# Patient Record
Sex: Female | Born: 1935 | Race: Black or African American | Hispanic: No | Smoking: Former smoker
Health system: Southern US, Community
[De-identification: ages and names within clinical notes are randomized; demographics above are authoritative.]

## PROBLEM LIST (undated history)

## (undated) DIAGNOSIS — E785 Hyperlipidemia, unspecified: Secondary | ICD-10-CM

## (undated) DIAGNOSIS — M1990 Unspecified osteoarthritis, unspecified site: Secondary | ICD-10-CM

## (undated) DIAGNOSIS — G9340 Encephalopathy, unspecified: Secondary | ICD-10-CM

## (undated) DIAGNOSIS — I10 Essential (primary) hypertension: Secondary | ICD-10-CM

## (undated) DIAGNOSIS — E669 Obesity, unspecified: Secondary | ICD-10-CM

## (undated) DIAGNOSIS — K922 Gastrointestinal hemorrhage, unspecified: Secondary | ICD-10-CM

## (undated) DIAGNOSIS — M533 Sacrococcygeal disorders, not elsewhere classified: Secondary | ICD-10-CM

## (undated) DIAGNOSIS — R413 Other amnesia: Secondary | ICD-10-CM

## (undated) HISTORY — PX: JOINT REPLACEMENT: SHX530

## (undated) HISTORY — DX: Sacrococcygeal disorders, not elsewhere classified: M53.3

## (undated) HISTORY — DX: Other amnesia: R41.3

## (undated) HISTORY — DX: Essential (primary) hypertension: I10

## (undated) HISTORY — PX: CHOLECYSTECTOMY: SHX55

## (undated) HISTORY — DX: Encephalopathy, unspecified: G93.40

## (undated) HISTORY — DX: Obesity, unspecified: E66.9

## (undated) HISTORY — PX: APPENDECTOMY: SHX54

## (undated) HISTORY — DX: Gastrointestinal hemorrhage, unspecified: K92.2

## (undated) HISTORY — DX: Unspecified osteoarthritis, unspecified site: M19.90

## (undated) HISTORY — DX: Hyperlipidemia, unspecified: E78.5

---

## 1962-04-12 HISTORY — PX: OTHER SURGICAL HISTORY: SHX169

## 1986-04-12 HISTORY — PX: COLON RESECTION: SHX5231

## 1996-04-12 HISTORY — PX: POLYPECTOMY: SHX149

## 1999-09-10 ENCOUNTER — Encounter (INDEPENDENT_AMBULATORY_CARE_PROVIDER_SITE_OTHER): Payer: Self-pay | Admitting: *Deleted

## 1999-10-21 ENCOUNTER — Other Ambulatory Visit: Admission: RE | Admit: 1999-10-21 | Discharge: 1999-10-21 | Payer: Self-pay | Admitting: Family Medicine

## 1999-10-29 ENCOUNTER — Encounter: Admission: RE | Admit: 1999-10-29 | Discharge: 1999-10-29 | Payer: Self-pay | Admitting: Family Medicine

## 1999-10-29 ENCOUNTER — Encounter: Payer: Self-pay | Admitting: Family Medicine

## 2000-05-13 HISTORY — PX: OTHER SURGICAL HISTORY: SHX169

## 2000-06-03 ENCOUNTER — Encounter (INDEPENDENT_AMBULATORY_CARE_PROVIDER_SITE_OTHER): Payer: Self-pay | Admitting: Specialist

## 2000-06-03 ENCOUNTER — Ambulatory Visit (HOSPITAL_COMMUNITY): Admission: RE | Admit: 2000-06-03 | Discharge: 2000-06-03 | Payer: Self-pay | Admitting: Obstetrics and Gynecology

## 2000-10-10 ENCOUNTER — Encounter (INDEPENDENT_AMBULATORY_CARE_PROVIDER_SITE_OTHER): Payer: Self-pay | Admitting: *Deleted

## 2000-11-03 ENCOUNTER — Encounter: Admission: RE | Admit: 2000-11-03 | Discharge: 2000-11-03 | Payer: Self-pay | Admitting: Family Medicine

## 2000-11-03 ENCOUNTER — Encounter: Payer: Self-pay | Admitting: Family Medicine

## 2002-01-08 ENCOUNTER — Encounter: Payer: Self-pay | Admitting: Family Medicine

## 2002-01-08 ENCOUNTER — Encounter: Admission: RE | Admit: 2002-01-08 | Discharge: 2002-01-08 | Payer: Self-pay | Admitting: Family Medicine

## 2002-11-07 ENCOUNTER — Encounter: Payer: Self-pay | Admitting: Family Medicine

## 2002-11-07 ENCOUNTER — Encounter: Admission: RE | Admit: 2002-11-07 | Discharge: 2002-11-07 | Payer: Self-pay | Admitting: Family Medicine

## 2003-04-22 ENCOUNTER — Emergency Department (HOSPITAL_COMMUNITY): Admission: EM | Admit: 2003-04-22 | Discharge: 2003-04-23 | Payer: Self-pay | Admitting: Emergency Medicine

## 2004-03-04 ENCOUNTER — Encounter: Admission: RE | Admit: 2004-03-04 | Discharge: 2004-03-04 | Payer: Self-pay | Admitting: Family Medicine

## 2004-06-10 ENCOUNTER — Ambulatory Visit: Payer: Self-pay | Admitting: Family Medicine

## 2004-08-03 ENCOUNTER — Ambulatory Visit: Payer: Self-pay | Admitting: Family Medicine

## 2005-02-25 ENCOUNTER — Ambulatory Visit: Payer: Self-pay | Admitting: Family Medicine

## 2005-03-03 ENCOUNTER — Ambulatory Visit: Payer: Self-pay | Admitting: Family Medicine

## 2005-03-18 ENCOUNTER — Encounter: Admission: RE | Admit: 2005-03-18 | Discharge: 2005-03-18 | Payer: Self-pay | Admitting: Family Medicine

## 2005-09-03 ENCOUNTER — Ambulatory Visit: Payer: Self-pay | Admitting: Family Medicine

## 2006-02-24 ENCOUNTER — Ambulatory Visit: Payer: Self-pay | Admitting: Family Medicine

## 2006-03-08 ENCOUNTER — Ambulatory Visit: Payer: Self-pay | Admitting: *Deleted

## 2006-03-11 ENCOUNTER — Ambulatory Visit: Payer: Self-pay | Admitting: Family Medicine

## 2006-03-15 ENCOUNTER — Ambulatory Visit: Payer: Self-pay | Admitting: Family Medicine

## 2006-03-24 ENCOUNTER — Encounter (INDEPENDENT_AMBULATORY_CARE_PROVIDER_SITE_OTHER): Payer: Self-pay | Admitting: *Deleted

## 2006-03-24 ENCOUNTER — Ambulatory Visit: Payer: Self-pay | Admitting: Family Medicine

## 2006-04-15 ENCOUNTER — Ambulatory Visit: Payer: Self-pay | Admitting: Family Medicine

## 2006-04-15 LAB — FECAL OCCULT BLOOD, GUAIAC: Fecal Occult Blood: NEGATIVE

## 2006-05-17 ENCOUNTER — Encounter: Admission: RE | Admit: 2006-05-17 | Discharge: 2006-05-17 | Payer: Self-pay | Admitting: Family Medicine

## 2006-07-05 ENCOUNTER — Ambulatory Visit: Payer: Self-pay | Admitting: Family Medicine

## 2006-07-05 ENCOUNTER — Encounter (INDEPENDENT_AMBULATORY_CARE_PROVIDER_SITE_OTHER): Payer: Self-pay | Admitting: *Deleted

## 2006-07-05 LAB — CONVERTED CEMR LAB
BUN: 18 mg/dL (ref 6–23)
Calcium: 10.1 mg/dL (ref 8.4–10.5)
Creatinine, Ser: 1.2 mg/dL (ref 0.4–1.2)
Creatinine,U: 184.3 mg/dL
Glucose, Bld: 113 mg/dL — ABNORMAL HIGH (ref 70–99)
Hemoglobin: 11.5 g/dL — ABNORMAL LOW (ref 12.0–15.0)
Hgb A1c MFr Bld: 6.6 %
Hgb A1c MFr Bld: 6.6 % — ABNORMAL HIGH (ref 4.6–6.0)
Microalb Creat Ratio: 6 mg/g (ref 0.0–30.0)
Microalb, Ur: 1.1 mg/dL (ref 0.0–1.9)
Phosphorus: 3 mg/dL (ref 2.3–4.6)
Potassium: 4 meq/L (ref 3.5–5.1)

## 2006-07-26 ENCOUNTER — Encounter (INDEPENDENT_AMBULATORY_CARE_PROVIDER_SITE_OTHER): Payer: Self-pay | Admitting: *Deleted

## 2006-07-26 DIAGNOSIS — M1990 Unspecified osteoarthritis, unspecified site: Secondary | ICD-10-CM | POA: Insufficient documentation

## 2006-07-26 DIAGNOSIS — I8000 Phlebitis and thrombophlebitis of superficial vessels of unspecified lower extremity: Secondary | ICD-10-CM | POA: Insufficient documentation

## 2006-07-26 DIAGNOSIS — G479 Sleep disorder, unspecified: Secondary | ICD-10-CM

## 2006-07-26 DIAGNOSIS — M8580 Other specified disorders of bone density and structure, unspecified site: Secondary | ICD-10-CM

## 2006-07-26 DIAGNOSIS — K649 Unspecified hemorrhoids: Secondary | ICD-10-CM | POA: Insufficient documentation

## 2006-07-26 DIAGNOSIS — E785 Hyperlipidemia, unspecified: Secondary | ICD-10-CM | POA: Insufficient documentation

## 2007-03-06 ENCOUNTER — Telehealth (INDEPENDENT_AMBULATORY_CARE_PROVIDER_SITE_OTHER): Payer: Self-pay | Admitting: *Deleted

## 2007-03-06 ENCOUNTER — Telehealth: Payer: Self-pay | Admitting: Family Medicine

## 2007-03-28 ENCOUNTER — Encounter: Payer: Self-pay | Admitting: Family Medicine

## 2007-03-28 ENCOUNTER — Other Ambulatory Visit: Admission: RE | Admit: 2007-03-28 | Discharge: 2007-03-28 | Payer: Self-pay | Admitting: Family Medicine

## 2007-03-28 ENCOUNTER — Ambulatory Visit: Payer: Self-pay | Admitting: Family Medicine

## 2007-03-29 LAB — CONVERTED CEMR LAB
ALT: 18 units/L (ref 0–35)
AST: 33 units/L (ref 0–37)
BUN: 17 mg/dL (ref 6–23)
Basophils Absolute: 0 10*3/uL (ref 0.0–0.1)
CO2: 26 meq/L (ref 19–32)
Calcium: 10.1 mg/dL (ref 8.4–10.5)
Cholesterol: 157 mg/dL (ref 0–200)
Creatinine, Ser: 1.1 mg/dL (ref 0.4–1.2)
Eosinophils Relative: 0.1 % (ref 0.0–5.0)
GFR calc Af Amer: 62 mL/min
Glucose, Bld: 122 mg/dL — ABNORMAL HIGH (ref 70–99)
HDL: 40.3 mg/dL (ref >39.0)
LDL Cholesterol: 86 mg/dL (ref 0–99)
MCHC: 33.5 g/dL (ref 30.0–36.0)
MCV: 85 fL (ref 78.0–100.0)
Neutrophils Relative %: 58.3 % (ref 43.0–77.0)
Platelets: 221 10*3/uL (ref 150–400)
Potassium: 4.5 meq/L (ref 3.5–5.1)
RBC: 4.03 M/uL (ref 3.87–5.11)
RDW: 13.3 % (ref 11.5–14.6)
Total CHOL/HDL Ratio: 3.9
Total Protein: 7.4 g/dL (ref 6.0–8.3)
VLDL: 31 mg/dL (ref 0–40)
WBC: 6.6 10*3/uL (ref 4.5–10.5)

## 2007-04-04 ENCOUNTER — Encounter (INDEPENDENT_AMBULATORY_CARE_PROVIDER_SITE_OTHER): Payer: Self-pay | Admitting: *Deleted

## 2007-04-24 ENCOUNTER — Encounter (INDEPENDENT_AMBULATORY_CARE_PROVIDER_SITE_OTHER): Payer: Self-pay | Admitting: *Deleted

## 2007-05-18 ENCOUNTER — Ambulatory Visit: Payer: Self-pay | Admitting: Family Medicine

## 2007-05-18 ENCOUNTER — Telehealth: Payer: Self-pay | Admitting: Family Medicine

## 2007-05-18 LAB — CONVERTED CEMR LAB
Casts: 0 /lpf
Glucose, Urine, Semiquant: NEGATIVE
Ketones, urine, test strip: NEGATIVE
Nitrite: NEGATIVE
Protein, U semiquant: 30
Protein, U semiquant: 30
Specific Gravity, Urine: 1.025
Yeast, UA: 0
pH: 6
pH: 6

## 2007-05-19 ENCOUNTER — Encounter: Admission: RE | Admit: 2007-05-19 | Discharge: 2007-05-19 | Payer: Self-pay | Admitting: Family Medicine

## 2007-05-19 ENCOUNTER — Encounter: Payer: Self-pay | Admitting: Family Medicine

## 2007-05-24 ENCOUNTER — Encounter (INDEPENDENT_AMBULATORY_CARE_PROVIDER_SITE_OTHER): Payer: Self-pay | Admitting: *Deleted

## 2007-07-13 ENCOUNTER — Ambulatory Visit: Payer: Self-pay | Admitting: Family Medicine

## 2007-08-03 ENCOUNTER — Telehealth: Payer: Self-pay | Admitting: Family Medicine

## 2007-09-27 ENCOUNTER — Ambulatory Visit: Payer: Self-pay | Admitting: Family Medicine

## 2007-09-28 LAB — CONVERTED CEMR LAB
HDL: 43.7 mg/dL (ref >39.0)
Hgb A1c MFr Bld: 7 % — ABNORMAL HIGH (ref 4.6–6.0)
LDL Cholesterol: 95 mg/dL (ref 0–99)
VLDL: 32 mg/dL (ref 0–40)

## 2007-10-31 ENCOUNTER — Ambulatory Visit: Payer: Self-pay | Admitting: Family Medicine

## 2007-10-31 DIAGNOSIS — E669 Obesity, unspecified: Secondary | ICD-10-CM

## 2007-10-31 DIAGNOSIS — I10 Essential (primary) hypertension: Secondary | ICD-10-CM

## 2007-12-04 ENCOUNTER — Ambulatory Visit: Payer: Self-pay | Admitting: Family Medicine

## 2008-02-02 ENCOUNTER — Ambulatory Visit: Payer: Self-pay | Admitting: Family Medicine

## 2008-02-05 LAB — CONVERTED CEMR LAB
AST: 23 units/L (ref 0–37)
BUN: 19 mg/dL (ref 6–23)
Calcium: 9.9 mg/dL (ref 8.4–10.5)
Cholesterol: 174 mg/dL (ref 0–200)
Creatinine, Ser: 1.2 mg/dL (ref 0.4–1.2)
GFR calc non Af Amer: 46 mL/min
Hgb A1c MFr Bld: 6.9 % — ABNORMAL HIGH (ref 4.6–6.0)
Phosphorus: 3.7 mg/dL (ref 2.3–4.6)
Sodium: 140 meq/L (ref 135–145)
Total CHOL/HDL Ratio: 3.7

## 2008-03-05 ENCOUNTER — Ambulatory Visit: Payer: Self-pay | Admitting: Family Medicine

## 2008-03-26 ENCOUNTER — Telehealth (INDEPENDENT_AMBULATORY_CARE_PROVIDER_SITE_OTHER): Payer: Self-pay | Admitting: *Deleted

## 2008-05-22 ENCOUNTER — Encounter: Admission: RE | Admit: 2008-05-22 | Discharge: 2008-05-22 | Payer: Self-pay | Admitting: Family Medicine

## 2008-05-23 ENCOUNTER — Encounter (INDEPENDENT_AMBULATORY_CARE_PROVIDER_SITE_OTHER): Payer: Self-pay | Admitting: *Deleted

## 2008-07-08 ENCOUNTER — Ambulatory Visit: Payer: Self-pay | Admitting: Family Medicine

## 2008-07-08 LAB — CONVERTED CEMR LAB
Glucose, Urine, Semiquant: NEGATIVE
Protein, U semiquant: 100
Specific Gravity, Urine: 1.02
Urobilinogen, UA: 0.2

## 2008-09-17 ENCOUNTER — Ambulatory Visit: Payer: Self-pay | Admitting: Family Medicine

## 2008-09-17 LAB — CONVERTED CEMR LAB
Albumin: 3.9 g/dL (ref 3.5–5.2)
BUN: 17 mg/dL (ref 6–23)
Basophils Relative: 0.3 % (ref 0.0–3.0)
Calcium: 10 mg/dL (ref 8.4–10.5)
Cholesterol: 160 mg/dL (ref 0–200)
Creatinine, Ser: 1 mg/dL (ref 0.4–1.2)
Creatinine,U: 96.6 mg/dL
Hemoglobin: 11.6 g/dL — ABNORMAL LOW (ref 12.0–15.0)
Hgb A1c MFr Bld: 6.8 % — ABNORMAL HIGH (ref 4.6–6.5)
Lymphocytes Relative: 36.2 % (ref 12.0–46.0)
Monocytes Relative: 9.3 % (ref 3.0–12.0)
Neutrophils Relative %: 53.9 % (ref 43.0–77.0)
Phosphorus: 3.3 mg/dL (ref 2.3–4.6)
Platelets: 222 10*3/uL (ref 150.0–400.0)
RBC: 4.08 M/uL (ref 3.87–5.11)
RDW: 12.9 % (ref 11.5–14.6)
TSH: 2.37 microintl units/mL (ref 0.35–5.50)
Total Protein: 7.9 g/dL (ref 6.0–8.3)
Triglycerides: 134 mg/dL (ref 0.0–149.0)
WBC: 6.1 10*3/uL (ref 4.5–10.5)

## 2008-09-25 ENCOUNTER — Ambulatory Visit: Payer: Self-pay | Admitting: Family Medicine

## 2008-10-22 ENCOUNTER — Encounter (INDEPENDENT_AMBULATORY_CARE_PROVIDER_SITE_OTHER): Payer: Self-pay | Admitting: *Deleted

## 2008-11-28 ENCOUNTER — Encounter: Payer: Self-pay | Admitting: Family Medicine

## 2008-11-29 ENCOUNTER — Encounter: Payer: Self-pay | Admitting: Family Medicine

## 2008-11-30 ENCOUNTER — Encounter: Payer: Self-pay | Admitting: Family Medicine

## 2008-12-02 ENCOUNTER — Encounter: Payer: Self-pay | Admitting: Family Medicine

## 2008-12-13 ENCOUNTER — Encounter: Payer: Self-pay | Admitting: Family Medicine

## 2008-12-26 ENCOUNTER — Ambulatory Visit: Payer: Self-pay | Admitting: Gastroenterology

## 2008-12-30 ENCOUNTER — Ambulatory Visit: Payer: Self-pay | Admitting: Family Medicine

## 2008-12-30 LAB — CONVERTED CEMR LAB
Casts: 0 /lpf
Glucose, Urine, Semiquant: NEGATIVE
Protein, U semiquant: NEGATIVE
RBC / HPF: 1
Urine crystals, microscopic: 0 /hpf
Yeast, UA: 0

## 2008-12-31 ENCOUNTER — Encounter: Payer: Self-pay | Admitting: Family Medicine

## 2009-01-08 ENCOUNTER — Ambulatory Visit: Payer: Self-pay | Admitting: Gastroenterology

## 2009-03-18 ENCOUNTER — Telehealth: Payer: Self-pay | Admitting: Family Medicine

## 2009-03-18 ENCOUNTER — Ambulatory Visit: Payer: Self-pay | Admitting: Family Medicine

## 2009-03-18 LAB — CONVERTED CEMR LAB
Bilirubin Urine: NEGATIVE
Glucose, Urine, Semiquant: NEGATIVE
Nitrite: NEGATIVE
Specific Gravity, Urine: 1.01
Urobilinogen, UA: 0.2
Yeast, UA: 0

## 2009-04-10 ENCOUNTER — Ambulatory Visit: Payer: Self-pay | Admitting: Family Medicine

## 2009-04-13 LAB — CONVERTED CEMR LAB
ALT: 13 units/L (ref 0–35)
Albumin: 3.9 g/dL (ref 3.5–5.2)
BUN: 20 mg/dL (ref 6–23)
Chloride: 104 meq/L (ref 96–112)
GFR calc non Af Amer: 50.85 mL/min (ref >60)
HDL: 50.8 mg/dL (ref >39.00)
TSH: 2.41 microintl units/mL (ref 0.35–5.50)
Triglycerides: 160 mg/dL — ABNORMAL HIGH (ref 0.0–149.0)

## 2009-04-16 ENCOUNTER — Ambulatory Visit: Payer: Self-pay | Admitting: Family Medicine

## 2009-04-24 ENCOUNTER — Telehealth: Payer: Self-pay | Admitting: Gastroenterology

## 2009-04-25 ENCOUNTER — Ambulatory Visit: Payer: Self-pay | Admitting: Family Medicine

## 2009-04-28 ENCOUNTER — Telehealth (INDEPENDENT_AMBULATORY_CARE_PROVIDER_SITE_OTHER): Payer: Self-pay | Admitting: *Deleted

## 2009-04-29 ENCOUNTER — Telehealth: Payer: Self-pay | Admitting: Family Medicine

## 2009-04-30 ENCOUNTER — Ambulatory Visit: Payer: Self-pay | Admitting: Cardiology

## 2009-05-06 ENCOUNTER — Encounter: Payer: Self-pay | Admitting: Cardiology

## 2009-05-06 ENCOUNTER — Ambulatory Visit: Payer: Self-pay

## 2009-05-09 ENCOUNTER — Encounter: Payer: Self-pay | Admitting: Cardiology

## 2009-05-20 ENCOUNTER — Ambulatory Visit: Payer: Self-pay | Admitting: Family Medicine

## 2009-06-09 ENCOUNTER — Inpatient Hospital Stay (HOSPITAL_COMMUNITY): Admission: RE | Admit: 2009-06-09 | Discharge: 2009-06-13 | Payer: Self-pay | Admitting: Orthopedic Surgery

## 2009-06-11 ENCOUNTER — Encounter (INDEPENDENT_AMBULATORY_CARE_PROVIDER_SITE_OTHER): Payer: Self-pay | Admitting: Orthopedic Surgery

## 2009-06-11 ENCOUNTER — Ambulatory Visit: Payer: Self-pay | Admitting: Vascular Surgery

## 2009-06-13 ENCOUNTER — Encounter: Payer: Self-pay | Admitting: Family Medicine

## 2009-07-01 ENCOUNTER — Telehealth: Payer: Self-pay | Admitting: Family Medicine

## 2009-07-08 ENCOUNTER — Inpatient Hospital Stay (HOSPITAL_COMMUNITY): Admission: EM | Admit: 2009-07-08 | Discharge: 2009-07-17 | Payer: Self-pay | Admitting: Emergency Medicine

## 2009-07-09 ENCOUNTER — Telehealth: Payer: Self-pay | Admitting: Family Medicine

## 2009-07-09 ENCOUNTER — Ambulatory Visit: Payer: Self-pay | Admitting: Gastroenterology

## 2009-07-11 ENCOUNTER — Encounter: Payer: Self-pay | Admitting: Gastroenterology

## 2009-07-11 HISTORY — PX: TOTAL KNEE ARTHROPLASTY: SHX125

## 2009-07-14 ENCOUNTER — Encounter: Payer: Self-pay | Admitting: Family Medicine

## 2009-07-14 ENCOUNTER — Telehealth: Payer: Self-pay | Admitting: Family Medicine

## 2009-08-15 ENCOUNTER — Telehealth: Payer: Self-pay | Admitting: Family Medicine

## 2009-08-18 ENCOUNTER — Telehealth: Payer: Self-pay | Admitting: Family Medicine

## 2009-08-18 ENCOUNTER — Ambulatory Visit: Payer: Self-pay | Admitting: Family Medicine

## 2009-08-18 DIAGNOSIS — D649 Anemia, unspecified: Secondary | ICD-10-CM

## 2009-08-18 DIAGNOSIS — L89609 Pressure ulcer of unspecified heel, unspecified stage: Secondary | ICD-10-CM | POA: Insufficient documentation

## 2009-08-19 LAB — CONVERTED CEMR LAB
AST: 23 units/L (ref 0–37)
Albumin: 3.9 g/dL (ref 3.5–5.2)
Basophils Absolute: 0 10*3/uL (ref 0.0–0.1)
Calcium: 10.1 mg/dL (ref 8.4–10.5)
Chloride: 102 meq/L (ref 96–112)
Creatinine, Ser: 0.9 mg/dL (ref 0.4–1.2)
Eosinophils Absolute: 0 10*3/uL (ref 0.0–0.7)
Eosinophils Relative: 0.1 % (ref 0.0–5.0)
GFR calc non Af Amer: 73.85 mL/min (ref >60)
MCHC: 33.3 g/dL (ref 30.0–36.0)
MCV: 86 fL (ref 78.0–100.0)
Monocytes Absolute: 0.7 10*3/uL (ref 0.1–1.0)
Phosphorus: 2.9 mg/dL (ref 2.3–4.6)
Potassium: 5.1 meq/L (ref 3.5–5.1)
RBC: 3.69 M/uL — ABNORMAL LOW (ref 3.87–5.11)
Sodium: 139 meq/L (ref 135–145)

## 2009-09-18 ENCOUNTER — Ambulatory Visit: Payer: Self-pay | Admitting: Family Medicine

## 2009-09-22 ENCOUNTER — Telehealth: Payer: Self-pay | Admitting: Family Medicine

## 2009-09-22 LAB — CONVERTED CEMR LAB
Albumin: 3.9 g/dL (ref 3.5–5.2)
BUN: 17 mg/dL (ref 6–23)
Basophils Absolute: 0 10*3/uL (ref 0.0–0.1)
Basophils Relative: 0.4 % (ref 0.0–3.0)
Chloride: 104 meq/L (ref 96–112)
Cholesterol: 260 mg/dL — ABNORMAL HIGH (ref 0–200)
Eosinophils Relative: 0.2 % (ref 0.0–5.0)
HCT: 34.2 % — ABNORMAL LOW (ref 36.0–46.0)
HDL: 52.4 mg/dL (ref >39.00)
Lymphocytes Relative: 30 % (ref 12.0–46.0)
Lymphs Abs: 2.2 10*3/uL (ref 0.7–4.0)
Monocytes Relative: 7.4 % (ref 3.0–12.0)
Neutro Abs: 4.5 10*3/uL (ref 1.4–7.7)
Platelets: 240 10*3/uL (ref 150.0–400.0)
Sodium: 140 meq/L (ref 135–145)
Total CHOL/HDL Ratio: 5
Triglycerides: 312 mg/dL — ABNORMAL HIGH (ref 0.0–149.0)

## 2009-10-15 ENCOUNTER — Ambulatory Visit: Payer: Self-pay | Admitting: Family Medicine

## 2009-10-21 ENCOUNTER — Encounter: Payer: Self-pay | Admitting: Family Medicine

## 2009-11-12 ENCOUNTER — Telehealth: Payer: Self-pay | Admitting: Family Medicine

## 2009-11-13 ENCOUNTER — Encounter: Payer: Self-pay | Admitting: Family Medicine

## 2009-12-16 ENCOUNTER — Telehealth: Payer: Self-pay | Admitting: Family Medicine

## 2010-01-15 ENCOUNTER — Telehealth: Payer: Self-pay | Admitting: Family Medicine

## 2010-01-15 ENCOUNTER — Encounter: Payer: Self-pay | Admitting: Family Medicine

## 2010-02-23 ENCOUNTER — Telehealth: Payer: Self-pay | Admitting: Family Medicine

## 2010-02-23 ENCOUNTER — Encounter: Payer: Self-pay | Admitting: Family Medicine

## 2010-02-26 ENCOUNTER — Ambulatory Visit: Payer: Self-pay | Admitting: Family Medicine

## 2010-02-26 DIAGNOSIS — K439 Ventral hernia without obstruction or gangrene: Secondary | ICD-10-CM | POA: Insufficient documentation

## 2010-03-23 ENCOUNTER — Ambulatory Visit: Payer: Self-pay | Admitting: Family Medicine

## 2010-03-23 DIAGNOSIS — R7309 Other abnormal glucose: Secondary | ICD-10-CM

## 2010-03-30 ENCOUNTER — Ambulatory Visit: Payer: Self-pay | Admitting: Family Medicine

## 2010-03-30 DIAGNOSIS — J209 Acute bronchitis, unspecified: Secondary | ICD-10-CM

## 2010-03-30 LAB — CONVERTED CEMR LAB
ALT: 15 units/L (ref 0–35)
AST: 20 units/L (ref 0–37)
Basophils Relative: 0.6 % (ref 0.0–3.0)
Eosinophils Relative: 0.1 % (ref 0.0–5.0)
HCT: 34.1 % — ABNORMAL LOW (ref 36.0–46.0)
HDL: 48.7 mg/dL (ref >39.00)
Hemoglobin: 11.5 g/dL — ABNORMAL LOW (ref 12.0–15.0)
Lymphs Abs: 1.7 10*3/uL (ref 0.7–4.0)
Platelets: 173 10*3/uL (ref 150.0–400.0)
RBC: 3.98 M/uL (ref 3.87–5.11)
Triglycerides: 190 mg/dL — ABNORMAL HIGH (ref 0.0–149.0)
VLDL: 38 mg/dL (ref 0.0–40.0)
WBC: 6.7 10*3/uL (ref 4.5–10.5)

## 2010-05-03 ENCOUNTER — Encounter: Payer: Self-pay | Admitting: Family Medicine

## 2010-05-12 NOTE — Medication Information (Signed)
Summary: Prior Authorization Request for Omeprazole  Prior Authorization Request for Omeprazole   Imported By: Laural Benes 01/09/2010 11:26:27  _____________________________________________________________________  External Attachment:    Type:   Image     Comment:   External Document

## 2010-05-12 NOTE — Progress Notes (Signed)
Summary: ok for boost or ensure?  Phone Note Call from Patient   Caller: Cloyde Reams [phone] Call For: Allena Earing MD Summary of Call: pt's neice states pt is at rehab facility after having knee surgery and she is concerned that pt might not be getting enough nutrition.  She asks what she can supplement her diet with.  I advised boost or ensure but neice is concerned about pt's blood sugar going up with these.  Please advise. Initial call taken by: Marty Heck CMA,  July 01, 2009 9:53 AM  Follow-up for Phone Call        I think there are actually some diabetic meal supplements over the counter -- can call Rob at [facility] and see if he carries any of them-- they tend to have less sugar and more protien Follow-up by: Allena Earing MD,  July 01, 2009 11:22 AM  Additional Follow-up for Phone Call Additional follow up Details #1::        Advised pt's neice.  She will check with her pharmacist about the supplements. Additional Follow-up by: Marty Heck CMA,  July 01, 2009 12:49 PM

## 2010-05-12 NOTE — Assessment & Plan Note (Signed)
Summary: np6   Visit Type:  New Patient Referring Provider:  Dr. Glori Bickers Primary Provider:  Allena Earing MD  CC:  no complaints.  History of Present Illness: 77 yo with history of DM2 and HTN presents for cardiac evaluation prior to total knee replacement by Dr. Noemi Chapel.  She has had general anesthesia in the past with no problems.  She has never had chest pain or exertional dyspnea.  She is limited only by her knees.  She is able to climb a flight of steps with a cane.  She is able to do moderately exertional activities such as sweeping and house cleaning without difficulty. She had an ECG done by Dr. Glori Bickers that was read as old anteroseptal MI so she was sent for cardiology evaluation.  She has had no past symptoms consistent with MI.   ECG: NSR, 1st degree AV block, Qs in V1 and V2  Current Medications (verified): 1)  Imipramine Hcl 50 Mg Tabs (Imipramine Hcl) .... 2 By Mouth At Bedtime 2)  Lopid 600 Mg  Tabs (Gemfibrozil) .... One By Mouth Two Times A Day 3)  Ecotrin 325 Mg  Tbec (Aspirin) .... One By Mouth Qd 4)  Calcium 600 Mg  Tabs (Calcium) .... One By Mouth Bid 5)  Freestyle Lancets   Misc (Lancets) .... Test Blood Sugar Daily and Prn 6)  Freestyle Test   Strp (Glucose Blood) .... Test Blood Sugar As Directed 7)  Sea-Omega 300 Mg  Caps (Omega-3 Fatty Acids) .... Take 1 Capsule By Mouth Three Times A Day 8)  Lisinopril 5 Mg  Tabs (Lisinopril) .Marland Kitchen.. 1 By Mouth Once Daily  Allergies (verified): 1)  ! * Shellfish 2)  Voltaren 3)  Prozac 4)  * Paper Tape  Past History:  Past Surgical History: Last updated: 07/26/2006 Appendectomy Cholecystectomy Colon resection secondary to cancer- 1988 Two benign polyps removed- 1998 DEXA- 2001 & 2004 Cervicitis- Conization- 1964 Endometrial polyps/hyperplasia- laser treatment- 05/2000  Family History: Last updated: 04/30/2009 no DM in family brother had lung cancer father MI at 90, also CVA mother died at 62- did not know  why  Social History: Last updated: 04/30/2009 non smoker, quit tobacco > 10 years ago no alcohol widowed, lives with son has mentally impaired son she cares for   Past Medical History: 1. Diabetes mellitus, type II 2. Hyperlipidemia 3. Osteoarthritis 4. Osteoporosis 5. HTN  6. obesity   Family History: no DM in family brother had lung cancer father MI at 49, also CVA mother died at 24- did not know why  Social History: non smoker, quit tobacco > 10 years ago no alcohol widowed, lives with son has mentally impaired son she cares for   Review of Systems       All systems reviewed and negative except as per HPI.   Vital Signs:  Patient profile:   77 year old female Height:      71 inches Weight:      235.50 pounds BMI:     32.96 Pulse rate:   90 / minute Pulse rhythm:   regular BP sitting:   158 / 70  (left arm) Cuff size:   large  Vitals Entered By: Philemon Kingdom (April 30, 2009 2:15 PM)  Physical Exam  General:  Well developed, well nourished, in no acute distress. Head:  normocephalic and atraumatic Nose:  no deformity, discharge, inflammation, or lesions Mouth:  Teeth, gums and palate normal. Oral mucosa normal. Neck:  Neck supple, no JVD. No masses,  thyromegaly or abnormal cervical nodes. Lungs:  Clear bilaterally to auscultation and percussion. Heart:  Non-displaced PMI, chest non-tender; regular rate and rhythm, S1, S2 without murmurs, rubs or gallops. Carotid upstroke normal, no bruit. Pedals normal pulses. No edema, no varicosities. Abdomen:  Bowel sounds positive; abdomen soft and non-tender without masses, organomegaly, or hernias noted. No hepatosplenomegaly. Msk:  Back normal, normal gait. Muscle strength and tone normal. Extremities:  No clubbing or cyanosis. Neurologic:  Alert and oriented x 3. Skin:  Intact without lesions or rashes. Psych:  Normal affect.   Impression & Recommendations:  Problem # 1:  PREOPERATIVE EXAMINATION  (ICD-V72.84) Patient has no ischemic symptoms and reasonable exercise tolerance (limited only by her knee pain).  The Qs in V1 and V2 can be a normal variant, would be more concerning if there was a Q in V3 also.  I do not think that a pre-operative stress test would be helpful given her lack of symptoms.  I will have her do an echocardiogram to make sure that there is no evidence for prior MI.  She should be reasonable risk to proceed to surgery.  For general cardiac risk in a patient with diabetes, she should continue ASA 81 mg daily and ACEI.  Her LDL should be controlled to < 100.   Other Orders: Echocardiogram (Echo)  Patient Instructions: 1)  Your physician recommends that you schedule a follow-up appointment AS NEEDED. 2)  Your physician has requested that you have a dobutamine echocardiogram.  For further information please visit [email or web address].  Please follow instruction sheet as given. 3)  Your physician has requested that you have an echocardiogram.  Echocardiography is a painless test that uses sound waves to create images of your heart. It provides your doctor with information about the size and shape of your heart and how well your heart's chambers and valves are working.  This procedure takes approximately one hour. There are no restrictions for this procedure. Tues Jan 25 @ 4:00pm

## 2010-05-12 NOTE — Assessment & Plan Note (Signed)
Summary: CHECK ?LUMP ON STOMACH/CLE   Vital Signs:  Patient profile:   77 year old female Height:      71 inches Weight:      234.75 pounds BMI:     32.86 Temp:     98.9 degrees F oral Pulse rate:   84 / minute Pulse rhythm:   regular BP sitting:   140 / 68  (left arm) Cuff size:   large  Vitals Entered By: Ozzie Hoyle LPN (November 17, 624THL 12:24 PM) CC: Lump on left side of stomach when stands   History of Present Illness: CC: L abd lump  1 1/2 wk h/o lump left side of stomach.  Not hurting.  Never had anything like this before.  NO heavy lifting or straining recently.  worse when standing or going to sitting from supine.  go away when supine.  No n/v/stool changes, f/c.  On iron so stools normally dark.  No constipation/diarrhea.  + gaining weight.  Current Medications (verified): 1)  Imipramine Hcl 50 Mg Tabs (Imipramine Hcl) .... 2 By Mouth At Bedtime 2)  Ecotrin 325 Mg  Tbec (Aspirin) .... One By Mouth Daily 3)  Calcium 600 Mg  Tabs (Calcium) .... One By Mouth Twice A Day 4)  Freestyle Lancets   Misc (Lancets) .... Test Blood Sugar Daily and Prn 5)  Freestyle Test   Strp (Glucose Blood) .... Test Blood Sugar As Directed 6)  Sea-Omega 300 Mg  Caps (Omega-3 Fatty Acids) .... Take 1 Capsule By Mouth Three Times A Day 7)  Lisinopril 10 Mg Tabs (Lisinopril) .... Take 1 Tablet By Mouth Once A Day 8)  Vitamin C 500 Mg  Tabs (Ascorbic Acid) .... Take 1 Tablet By Mouth Once A Day 9)  Methocarbamol 500 Mg Tabs (Methocarbamol) .... Take One Tablet Every 6 Hours As Needed 10)  Ferrous Sulfate 325 (65 Fe) Mg  Tabs (Ferrous Sulfate) .... Take 1 Tablet By Mouth Once A Day 11)  Omeprazole 20 Mg Cpdr (Omeprazole) .... Take 1 Tablet By Mouth Once A Day 12)  Multivitamins   Tabs (Multiple Vitamin) .... Take 1 Tablet By Mouth Once A Day  Allergies: 1)  ! * Shellfish 2)  Voltaren 3)  Prozac 4)  * Paper Tape  Past History:  Past Medical History: Last updated: 08/18/2009 1. Diabetes  mellitus, type II 2. Hyperlipidemia 3. Osteoarthritis 4. Osteoporosis 5. HTN  6. obesity  upper GI bleed/ AV malformation/ (when anticoag)   Social History: Last updated: 04/30/2009 non smoker, quit tobacco > 10 years ago no alcohol widowed, lives with son has mentally impaired son she cares for   Review of Systems       per HPI  Physical Exam  General:  more robust today- walks well with cane Mouth:  pharynx pink and moist.   Lungs:  Normal respiratory effort, chest expands symmetrically. Lungs are clear to auscultation, no crackles or wheezes. Heart:  Normal rate and regular rhythm. S1 and S2 normal without gallop, murmur, click, rub or other extra sounds. Abdomen:  Bowel sounds positive,abdomen soft and non-tender without organomegaly noted. + midline incision scar.  L inferior scar below umbilicus 4in ventral hernia present with sitting/standing, resolves with supine.  nontender.  reducible. Pulses:  2+ rad pulses Extremities:  no pedal edema Skin:  Intact without suspicious lesions or rashes   Impression & Recommendations:  Problem # 1:  ABDOMINAL WALL HERNIA (ICD-553.20) incisional vs ventral.  no sxs.  reducible with supine.  monitor  for now.  advised if any pain to return for eval.  I don't think asxs hernia currently indicated for repair in this 77 yo with some medical comorbidities.  however if becomes sxs would revisit.  Complete Medication List: 1)  Imipramine Hcl 50 Mg Tabs (Imipramine hcl) .... 2 by mouth at bedtime 2)  Ecotrin 325 Mg Tbec (Aspirin) .... One by mouth daily 3)  Calcium 600 Mg Tabs (Calcium) .... One by mouth twice a day 4)  Freestyle Lancets Misc (Lancets) .... Test blood sugar daily and prn 5)  Freestyle Test Strp (Glucose blood) .... Test blood sugar as directed 6)  Sea-omega 300 Mg Caps (Omega-3 fatty acids) .... Take 1 capsule by mouth three times a day 7)  Lisinopril 10 Mg Tabs (Lisinopril) .... Take 1 tablet by mouth once a day 8)   Vitamin C 500 Mg Tabs (Ascorbic acid) .... Take 1 tablet by mouth once a day 9)  Methocarbamol 500 Mg Tabs (Methocarbamol) .... Take one tablet every 6 hours as needed 10)  Ferrous Sulfate 325 (65 Fe) Mg Tabs (Ferrous sulfate) .... Take 1 tablet by mouth once a day 11)  Omeprazole 20 Mg Cpdr (Omeprazole) .... Take 1 tablet by mouth once a day 12)  Multivitamins Tabs (Multiple vitamin) .... Take 1 tablet by mouth once a day  Patient Instructions: 1)  looks like a ventral or incisional hernia.   2)  We will just watch for now. 3)  If it starts bothering you, please return for [place] to re evaluate. 4)  Good to meet you today, call clinic with questions.   Orders Added: 1)  Est. Patient Level III [id number]    Current Allergies (reviewed today): ! * SHELLFISH VOLTAREN PROZAC * PAPER TAPE  Appended Document: CHECK ?LUMP ON STOMACH/CLE remote colectomy/[place] (1988)

## 2010-05-12 NOTE — Progress Notes (Signed)
Summary: surgery?  Phone Note Call from Patient Call back at [facility] Phone [phone]   Caller: Patient Call For: Dr. Sharlett Iles Reason for Call: Talk to Nurse Summary of Call: pt would like to know when she had surgery ordered by Dr. Sharlett Iles... she thinks it was around [place] Initial call taken by: Lucien Mons,  April 24, 2009 3:44 PM  Follow-up for Phone Call        Per colon report pt had colon resection for colon ca in 1991.  Pt notified. Follow-up by: Alberteen Spindle RN,  April 24, 2009 3:50 PM

## 2010-05-12 NOTE — Progress Notes (Signed)
Summary: regarding wound orders  Phone Note From Other Clinic   Caller: Loma Sousa with [facility]   [phone] Summary of Call: Home health nurse is asking if pt can have something like santyl to apply once a day to ulcer, cover with non adhering dressing.  Change once daily.  Also pt has spot on left buttocks, nurse has applied duoderm to that area, to change once a week.  Also she needs an off loading boot for right foot.  Nurse will visit pt three times this week, then twice a week for four weeks.  Santyl will need to be called to [facility].  Other than that, just call nurse back with verbal ok on the orders. Initial call taken by: Marty Heck CMA,  Aug 18, 2009 3:53 PM  Follow-up for Phone Call        I ok all those orders -- will sign whatever they need me to  thanks for her suggestions and help Follow-up by: Allena Earing MD,  Aug 18, 2009 4:09 PM  Additional Follow-up for Phone Call Additional follow up Details #1::        courtney with [facility] notified as instructed by telephone.  Loma Sousa said Dr Glori Bickers would need to send rx to [facility] for Santyl #2oz tube with insturctions to apply daily to wound. Loma Sousa will write the orders and send to Dr Glori Bickers for signature.Please advise. Ozzie Hoyle LPN  May  9, [id number] [id number] PM     Additional Follow-up for Phone Call Additional follow up Details #2::    px written on EMR for call in  Follow-up by: Allena Earing MD,  Aug 18, 2009 4:46 PM  Additional Follow-up for Phone Call Additional follow up Details #3:: Details for Additional Follow-up Action Taken: Medication phoned to [facility] as instructed. Left v/m for Courtney at [facility] that [facility] will have to order med and it will not be there until 08/19/09 so she would need to call to make sure it is ready before she goes to pick it up. Ozzie Hoyle LPN  May  9, 624THL [id number] PM   New/Updated Medications: SANTYL 250 UNIT/GM OINT (COLLAGENASE) apply to wound as directed once  daily Prescriptions: SANTYL 250 UNIT/GM OINT (COLLAGENASE) apply to wound as directed once daily  #2 oz x 1   Entered and Authorized by:   Allena Earing MD   Signed by:   Ozzie Hoyle LPN on [id number]   Method used:   Telephoned to ...       [facility] (retail)       [address]       [place], [place]  [address]       Ph: [phone]       Fax: [phone]   RxID:   [id number]

## 2010-05-12 NOTE — Progress Notes (Signed)
Summary: regarding wound care  Phone Note Other Incoming   Caller: Loma Sousa, home health nurse with [facility]  [phone] Summary of Call: Verbal ok given to extend pt's home health and skilled nursing for wound care, one time a week x 3 weeks.  Nurse says wound is almost healed.  It's about .2 cm x .2 cm. Initial call taken by: Marty Heck CMA,  September 22, 2009 2:44 PM  Follow-up for Phone Call        I give the verbal ok for that  will sign any paperwork they need when it comes  glad to hear of improvement! Follow-up by: Allena Earing MD,  September 22, 2009 4:58 PM

## 2010-05-12 NOTE — Progress Notes (Signed)
Summary: notes for surgical clearance faxed  Phone Note Outgoing Call   Summary of Call: Notes for surgical clearance faxed to Charyl Dancer at fax # [phone]. Initial call taken by: Marty Heck CMA,  April 28, 2009 10:58 AM

## 2010-05-12 NOTE — Medication Information (Signed)
Summary: Limited approval/[employer]  Limited approval/[employer]   Imported By: Bubba Hales 03/04/2010 13:49:15  _____________________________________________________________________  External Attachment:    Type:   Image     Comment:   External Document

## 2010-05-12 NOTE — Assessment & Plan Note (Signed)
Summary: ROA FOR 1-2 MONTH FOLLOW-UP   Vital Signs:  Patient profile:   77 year old female Height:      71 inches Weight:      216.25 pounds BMI:     30.27 Temp:     97.4 degrees F oral Pulse rate:   92 / minute Pulse rhythm:   regular BP sitting:   116 / 62  (left arm) Cuff size:   large  Vitals Entered By: Ozzie Hoyle LPN (June  9, 624THL 579FGE AM) CC: 1-2 month f/u   History of Present Illness: here for f/u of DM , lipids, HTN , ulcers on heels and anemia from hosp GI bleed is feeling much better overall less tired   wt is stable  great bp 116/62  anemia due for check on daily iron  last hb imp at 10.6-- no problems with iron   due for sugar check  sugars have been mainly below 120s   due for lipid check  knee is much better- stiff at times but tries to keep it moving  keeps up with PT exercise   wound care is still working on her heel ulcers on new expensive cream that is working    Allergies: 1)  ! * Shellfish 2)  Voltaren 3)  Prozac 4)  * Paper Tape  Past History:  Past Medical History: Last updated: 08/18/2009 1. Diabetes mellitus, type II 2. Hyperlipidemia 3. Osteoarthritis 4. Osteoporosis 5. HTN  6. obesity  upper GI bleed/ AV malformation/ (when anticoag)   Past Surgical History: Last updated: 08/18/2009 Appendectomy Cholecystectomy Colon resection secondary to cancer- 1988 Two benign polyps removed- 1998 DEXA- 2001 & 2004 Cervicitis- Conization- 1964 Endometrial polyps/hyperplasia- laser treatment- 05/2000 R knee replacement 4/11  upper GI bleed under anticoag (from AV malf) 4/11-- 5 u transfusion  Family History: Last updated: 04/30/2009 no DM in family brother had lung cancer father MI at 3, also CVA mother died at 1- did not know why  Social History: Last updated: 04/30/2009 non smoker, quit tobacco > 10 years ago no alcohol widowed, lives with son has mentally impaired son she cares for   Review of Systems General:   Complains of fatigue; denies fever, loss of appetite, and malaise; fatigue is improving. Eyes:  Denies eye irritation. CV:  Denies chest pain or discomfort, lightheadness, palpitations, and shortness of breath with exertion. Resp:  Denies cough, shortness of breath, and sputum productive. GI:  Denies change in bowel habits, nausea, and vomiting. GU:  Denies dysuria. MS:  Complains of joint pain and stiffness; denies joint redness, joint swelling, and muscle weakness. Derm:  Denies itching, lesion(s), poor wound healing, and rash. Neuro:  Denies numbness and tingling. Psych:  Denies anxiety and depression. Endo:  Denies cold intolerance, excessive thirst, excessive urination, and heat intolerance. Heme:  Denies abnormal bruising and bleeding.  Physical Exam  General:  more robust today- walks well with cane Head:  normocephalic, atraumatic, and no abnormalities observed.   Eyes:  vision grossly intact, pupils equal, pupils round, and pupils reactive to light.   Mouth:  pharynx pink and moist.   Neck:  supple with full rom and no masses or thyromegally, no JVD or carotid bruit  Lungs:  Normal respiratory effort, chest expands symmetrically. Lungs are clear to auscultation, no crackles or wheezes. Heart:  Normal rate and regular rhythm. S1 and S2 normal without gallop, murmur, click, rub or other extra sounds. Msk:  L foot wrapped in kurlex  no  acute joint changes knee incision is healed well  Pulses:  R and L carotid,radial,femoral,dorsalis pedis and posterior tibial pulses are full and equal bilaterally Extremities:  No clubbing, cyanosis, edema, or deformity noted with normal full range of motion of all joints.   Neurologic:  sensation intact to light touch and DTRs symmetrical and normal.  gait is steady with a cane Skin:  Intact without suspicious lesions or rashes Cervical Nodes:  No lymphadenopathy noted Psych:  normal affect, talkative and pleasant   Diabetes Management Exam:     Foot Exam (with socks and/or shoes not present):       Sensory-Pinprick/Light touch:          Left medial foot (L-4): normal          Left dorsal foot (L-5): normal          Left lateral foot (S-1): normal          Right medial foot (L-4): normal          Right dorsal foot (L-5): normal          Right lateral foot (S-1): normal       Sensory-Monofilament:          Left foot: normal          Right foot: normal       Inspection:          Left foot: normal          Right foot: normal       Nails:          Left foot: normal          Right foot: normal   Impression & Recommendations:  Problem # 1:  PRESSURE ULCER HEEL (ICD-707.07) Assessment Improved  per pt getting much better with wound care visits  did not examine today - pt did not want bandage to be taken off   Orders: Prescription Created Electronically [id number])  Problem # 2:  ANEMIA, SECONDARY TO ACUTE BLOOD LOSS (ICD-285.1) Assessment: Improved cbc today on iron no more gi bleeding expect imp Her updated medication list for this problem includes:    Ferrous Sulfate 325 (65 Fe) Mg Tabs (Ferrous sulfate) .Marland Kitchen... Take 1 tablet by mouth once a day  Orders: Venipuncture [id number]) TLB-Lipid Panel (80061-LIPID) TLB-Renal Function Panel (80069-RENAL) TLB-CBC Platelet - w/Differential (85025-CBCD) TLB-A1C / Hgb A1C (Glycohemoglobin) (83036-A1C) Prescription Created Electronically [phone])  Problem # 3:  HYPERTENSION, ESSENTIAL NOS (ICD-401.9) Assessment: Unchanged  very good control - no problems or changes f.u 6 wk lab today Her updated medication list for this problem includes:    Lisinopril 10 Mg Tabs (Lisinopril) .Marland Kitchen... Take 1 tablet by mouth once a day  Orders: Venipuncture [id number]) TLB-Lipid Panel (80061-LIPID) TLB-Renal Function Panel (80069-RENAL) TLB-CBC Platelet - w/Differential (85025-CBCD) TLB-A1C / Hgb A1C (Glycohemoglobin) (83036-A1C) Prescription Created Electronically [phone])  BP today: 116/62 Prior  BP: 114/66 (08/18/2009)  Labs Reviewed: K+: 5.1 (08/18/2009) Creat: : 0.9 (08/18/2009)   Chol: 165 (04/10/2009)   HDL: 50.80 (04/10/2009)   LDL: 82 (04/10/2009)   TG: 160.0 (04/10/2009)  Problem # 4:  HYPERLIPIDEMIA (ICD-272.4) Assessment: Unchanged  labs today was off med for inc lft in hospital  diet low sat fat reviewed  Orders: Venipuncture [id number]) TLB-Lipid Panel (80061-LIPID) TLB-Renal Function Panel (80069-RENAL) TLB-CBC Platelet - w/Differential (85025-CBCD) TLB-A1C / Hgb A1C (Glycohemoglobin) (83036-A1C) Prescription Created Electronically [phone])  Labs Reviewed: SGOT: 23 (08/18/2009)   SGPT: 12 (08/18/2009)   HDL:50.80 (04/10/2009), 49.60 (09/17/2008)  LDL:82 (  04/10/2009), 84 (09/17/2008)  Chol:165 (04/10/2009), 160 (09/17/2008)  Trig:160.0 (04/10/2009), 134.0 (09/17/2008)  Problem # 5:  DIABETES MELLITUS, TYPE II (ICD-250.00) Assessment: Improved  per pt excellent sugar control at home with diet wt loss also helped  lab today  fu6 mo  Her updated medication list for this problem includes:    Ecotrin 325 Mg Tbec (Aspirin) ..... One by mouth daily    Lisinopril 10 Mg Tabs (Lisinopril) .Marland Kitchen... Take 1 tablet by mouth once a day  Orders: Venipuncture [id number]) TLB-Lipid Panel (80061-LIPID) TLB-Renal Function Panel (80069-RENAL) TLB-CBC Platelet - w/Differential (85025-CBCD) TLB-A1C / Hgb A1C (Glycohemoglobin) (83036-A1C) Prescription Created Electronically [phone])  Labs Reviewed: Creat: 0.9 (08/18/2009)     Last Eye Exam: normal (05/13/2008) Reviewed HgBA1c results: 6.9 (04/10/2009)  6.8 (09/17/2008)  Complete Medication List: 1)  Imipramine Hcl 50 Mg Tabs (Imipramine hcl) .... 2 by mouth at bedtime 2)  Ecotrin 325 Mg Tbec (Aspirin) .... One by mouth daily 3)  Calcium 600 Mg Tabs (Calcium) .... One by mouth twice a day 4)  Freestyle Lancets Misc (Lancets) .... Test blood sugar daily and prn 5)  Freestyle Test Strp (Glucose blood) .... Test blood sugar as  directed 6)  Sea-omega 300 Mg Caps (Omega-3 fatty acids) .... Take 1 capsule by mouth three times a day 7)  Lisinopril 10 Mg Tabs (Lisinopril) .... Take 1 tablet by mouth once a day 8)  Vitamin C 500 Mg Tabs (Ascorbic acid) .... Take 1 tablet by mouth once a day 9)  Methocarbamol 500 Mg Tabs (Methocarbamol) .... Take one tablet every 6 hours as needed 10)  Ferrous Sulfate 325 (65 Fe) Mg Tabs (Ferrous sulfate) .... Take 1 tablet by mouth once a day 11)  Omeprazole 20 Mg Cpdr (Omeprazole) .... Take 1 tablet by mouth once a day 12)  Multivitamins Tabs (Multiple vitamin) .... Take 1 tablet by mouth once a day 13)  Bactroban 2 % Oint (Mupirocin) .... Apply to affected areas on heels and buttocks two times a day 14)  Santyl 250 Unit/gm Oint (Collagenase) .... Apply to wound as directed once daily  Patient Instructions: 1)  labs today 2)  no change in medicines  3)  keep up with exercise as tolerated  4)  blood pressure is good  5)  follow up with me in about 6 months 6)  I'm glad you are doing better  Prescriptions: IMIPRAMINE HCL 50 MG TABS (IMIPRAMINE HCL) 2 by mouth at bedtime  #180 x 3   Entered and Authorized by:   Allena Earing MD   Signed by:   Allena Earing MD on 09/18/2009   Method used:   Electronically to        [facility] (retail)       [address]       [place], [place]  [address]       Ph: [phone]       Fax: [phone]   [id number]   Current Allergies (reviewed today): ! * SHELLFISH VOLTAREN PROZAC * PAPER TAPE

## 2010-05-12 NOTE — Letter (Signed)
Summary: Clearance Letter  Press photographer at [facility], [place] [address]   Phone: [phone]  Fax: [phone]    May 09, 2009  Re:     Erin [name] Address:   [address]     [place], [place]  [address] DOB:     29-Jun-1928 MRN:     [id number]   Dear Dr. Noemi Chapel-  [name] was been cleared from a cardiology standpoint to proceed with orthopedic surgery.  Should you need any further information or assistance, please feel free to contact us at [phone].      Sincerely,    Loralie Champagne, MD

## 2010-05-12 NOTE — Progress Notes (Signed)
Summary: PHI  PHI   Imported By: Zenovia Jarred 05/01/2009 12:18:09  _____________________________________________________________________  External Attachment:    Type:   Image     Comment:   External Document

## 2010-05-12 NOTE — Procedures (Signed)
Summary: Upper Endoscopy  Patient: [name] Note: All result statuses are Final unless otherwise noted.  Tests: (1) Upper Endoscopy (EGD)   EGD Upper Endoscopy       [facility]     [place], [place]  [address]           ENDOSCOPY PROCEDURE REPORT           PATIENT:  [name], Erin  MR#:  [id number]     BIRTHDATE:  12/09/1928, 30 yrs. old  GENDER:           ENDOSCOPIST:  Sandy Salaam. Deatra Ina, MD     Referred by:           PROCEDURE DATE:  07/11/2009     PROCEDURE:  EGD for control of bleeding     ASA CLASS:  Class III     INDICATIONS:  hematemesis in  setting of coumadin-induced     coagulopathy           MEDICATIONS:   Fentanyl 60 mcg, Versed 6 mg     TOPICAL ANESTHETIC:  Cetacaine Spray           DESCRIPTION OF PROCEDURE:   After the risks benefits and     alternatives of the procedure were thoroughly explained, informed     consent was obtained.  The [id number] endoscope was     introduced through the mouth and advanced to the third portion of     the duodenum, without limitations.  The instrument was slowly     withdrawn as the mucosa was fully examined.     <<PROCEDUREIMAGES>>           AVM in the cardia (see image008). Single nonbleeding 79mm AVM -     fulgurated with the APC  Otherwise the examination was normal (see     image001, image002, image003, image004, image009, and image010).     Retroflexed views revealed no abnormalities.    The scope was then     withdrawn from the patient and the procedure completed.           COMPLICATIONS:  None           ENDOSCOPIC IMPRESSION:     1) AVM in the cardia - s/p ablation     2) Otherwise normal examination     RECOMMENDATIONS:     1) continue PPI     2) no further GI w/u           REPEAT EXAM:  No           ______________________________     Sandy Salaam. Deatra Ina, MD           CC:  Abner Greenspan, MD           n.     Lorrin MaisSandy Salaam. Katura Eatherly at 07/11/2009 03:01  PM           Ivette Loyal, [id number]  Note: An exclamation mark (!) indicates a result that was not dispersed into the flowsheet. Document Creation Date: 07/11/2009 4:14 PM _______________________________________________________________________  (1) Order result status: Final Collection or observation date-time: 07/11/2009 14:56 Requested date-time:  Receipt date-time:  Reported date-time:  Referring Physician:   Ordering Physician: Erskine Emery [phone]) Specimen Source:  Source: Tawanna Cooler Order Number: [id number] Lab site:

## 2010-05-12 NOTE — Progress Notes (Signed)
Summary: prior Josem Kaufmann is needed for omeprazole  Phone Note From Pharmacy   Caller: [facility]. / [facility] Summary of Call: Prior auth is needed for omeprazole, form is on your shelf.         Marty Heck CMA  November 12, 2009 12:50 PM   Follow-up for Phone Call        I filled out form- pt has had upper GI bleed in the past and this was noted Follow-up by: Allena Earing MD,  November 13, 2009 8:06 AM  Additional Follow-up for Phone Call Additional follow up Details #1::        Completed form faxed to [phone]. Margarita Grizzle has form if needed later.Ozzie Hoyle LPN  August  4, 624THL 8:24 AM

## 2010-05-12 NOTE — Progress Notes (Signed)
  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 surgery is not scheduled as of yet. Please call Juliann Pulse back at [phone] with who she is being referred to. Initial call taken by: Haynes Bast,  April 29, 2009 9:01 AM  Follow-up for Phone Call        please let pt know that Dr Noemi Chapel wants her to have clearance from a cardiologist before her surgery  she has a computer read change on EKG that concerns him -- I will do ref and route to [facility]  Follow-up by: Allena Earing MD,  April 29, 2009 9:10 AM  Additional Follow-up for Phone Call Additional follow up Details #1::        Called Juliann Pulse [facility] with who Ms Hoiland is being referred to by [facility]. Additional Follow-up by: Haynes Bast,  April 29, 2009 9:46 AM

## 2010-05-12 NOTE — Progress Notes (Signed)
Summary: prior Josem Kaufmann is needed for omeprazole  Phone Note From Pharmacy   Caller: [facility] Summary of Call: Prior Josem Kaufmann is needed for omeprazole, form is on your shelf. Initial call taken by: Marty Heck CMA,  January 15, 2010 11:13 AM  Follow-up for Phone Call        form done and in nurse in box  Follow-up by: Allena Earing MD,  January 15, 2010 12:38 PM  Additional Follow-up for Phone Call Additional follow up Details #1::        Form faxed.         Marty Heck CMA  January 15, 2010 2:17 PM      Appended Document: prior Josem Kaufmann is needed for omeprazole Prior auth given for omeprazole, advised pharmacy, approval letter placed on doctor's desk for signature and scanning.

## 2010-05-12 NOTE — Assessment & Plan Note (Signed)
Summary: CLEARANCE FOR KNEE REPLACEMENT/[facility]   Vital Signs:  Patient profile:   77 year old female Weight:      237 pounds BMI:     33.17 Temp:     98 degrees F oral Pulse rate:   88 / minute Pulse rhythm:   regular BP sitting:   140 / 70  (left arm) Cuff size:   large  Vitals Entered By: Marty Heck CMA (April 25, 2009 3:38 PM)  Serial Vital Signs/Assessments:  Time      Position  BP       Pulse  Resp  Temp     By                     130/80                         Allena Earing MD  CC: Clearance for knee surgery   History of Present Illness: here to get clearance for knee surgery   will be having total knee replacement -- (will eventually need both)  going to Dr Noemi Chapel   has had many surg in past  no problems with general anesthesia  voltaren gave her vomiting-- can take narcotics ok  no hx of blood clots , or bleeding disorder (she will be on coumadin for while)  willbe doing a rehab stay for a while   blood sugars have been good -- 120s for the most part  bp today is 140/70-- cannot check at home- her machine does not work   no hx of heart problems or lung problems     Allergies: 1)  ! * Shellfish 2)  Voltaren 3)  Prozac 4)  * Paper Tape  Past History:  Past Medical History: Last updated: 03/05/2008 Diabetes mellitus, type II Hyperlipidemia Osteoarthritis Osteoporosis HTN  obesity   Past Surgical History: Last updated: 07/26/2006 Appendectomy Cholecystectomy Colon resection secondary to cancer- 1988 Two benign polyps removed- 1998 DEXA- 2001 & 2004 Cervicitis- Conization- 1964 Endometrial polyps/hyperplasia- laser treatment- 05/2000  Family History: Last updated: 03/28/2007 no DM in family brother had lung cancer father CAD mother died at 56- did not know why  Social History: Last updated: 10/31/2007 non smoker no alcohol widowed has mentally impaired son she cares for   Review of Systems General:  Denies fatigue, fever,  loss of appetite, and malaise. Eyes:  Denies blurring and eye pain. ENT:  Denies nasal congestion and sore throat. CV:  Denies chest pain or discomfort, lightheadness, palpitations, and shortness of breath with exertion. Resp:  Denies cough, shortness of breath, and wheezing. GI:  Denies abdominal pain, change in bowel habits, indigestion, and nausea. GU:  Denies urinary hesitancy. MS:  Complains of joint pain and stiffness. Derm:  Denies lesion(s), poor wound healing, and rash. Neuro:  Denies numbness and tingling. Psych:  Denies anxiety and depression. Endo:  Denies cold intolerance, excessive thirst, excessive urination, and heat intolerance. Heme:  Denies abnormal bruising, bleeding, and enlarge lymph nodes.  Physical Exam  General:  overweight but generally well appearing  Head:  normocephalic, atraumatic, and no abnormalities observed.   Eyes:  vision grossly intact, pupils equal, pupils round, and pupils reactive to light.   Neck:  supple with full rom and no masses or thyromegally, no JVD or carotid bruit  Lungs:  Normal respiratory effort, chest expands symmetrically. Lungs are clear to auscultation, no crackles or wheezes. Heart:  Normal rate and regular rhythm.  S1 and S2 normal without gallop, murmur, click, rub or other extra sounds. Abdomen:  Bowel sounds positive,abdomen soft and non-tender without masses, organomegaly or hernias noted. no renal bruits  Msk:  poor rom R knee- walks with a cane  Pulses:  R and L carotid,radial,femoral,dorsalis pedis and posterior tibial pulses are full and equal bilaterally Extremities:  No clubbing, cyanosis, edema, or deformity noted with normal full range of motion of all joints.   Neurologic:  sensation intact to light touch, gait normal, and DTRs symmetrical and normal.  (walks with a cane) Skin:  Intact without suspicious lesions or rashes Cervical Nodes:  No lymphadenopathy noted Inguinal Nodes:  No significant adenopathy Psych:   normal affect, talkative and pleasant    Impression & Recommendations:  Problem # 1:  PREOPERATIVE EXAMINATION (ICD-V72.84) Assessment New no restrictions for upcoming surgery no cardiac or pulm problems/ clotting disorders  HTN and DM are in good control  no acute changes on EKG  Complete Medication List: 1)  Imipramine Hcl 50 Mg Tabs (Imipramine hcl) .... 2 by mouth at bedtime 2)  Lopid 600 Mg Tabs (Gemfibrozil) .... One by mouth two times a day 3)  Ecotrin 325 Mg Tbec (Aspirin) .... One by mouth qd 4)  Calcium 600 Mg Tabs (Calcium) .... One by mouth bid 5)  Freestyle Lancets Misc (Lancets) .... Test blood sugar daily and prn 6)  Freestyle Test Strp (Glucose blood) .... Test blood sugar as directed 7)  Sea-omega 300 Mg Caps (Omega-3 fatty acids) .... Take 1 capsule by mouth three times a day 8)  Lisinopril 5 Mg Tabs (Lisinopril) .Marland Kitchen.. 1 by mouth once daily  Patient Instructions: 1)  no restrictions for upcoming surgery  2)  please update me if any problems  Prescriptions: LOPID 600 MG  TABS (GEMFIBROZIL) one by mouth two times a day  #180 x 3   Entered and Authorized by:   Allena Earing MD   Signed by:   Allena Earing MD on 04/25/2009   Method used:   Electronically to        [facility] (retail)       [address] [place], [place]  [address]       Ph: [phone]       Fax: [phone]   RxID:   [id number]   Prior Medications (reviewed today): IMIPRAMINE HCL 50 MG TABS (IMIPRAMINE HCL) 2 by mouth at bedtime LOPID 600 MG  TABS (GEMFIBROZIL) one by mouth two times a day ECOTRIN 325 MG  TBEC (ASPIRIN) one by mouth qd CALCIUM 600 MG  TABS (CALCIUM) one by mouth bid FREESTYLE LANCETS   MISC (LANCETS) test blood sugar daily and prn FREESTYLE TEST   STRP (GLUCOSE BLOOD) test blood sugar as directed SEA-OMEGA 300 MG  CAPS (OMEGA-3 FATTY ACIDS) Take 1 capsule by mouth three times a day LISINOPRIL 5 MG  TABS  (LISINOPRIL) 1 by mouth once daily Current Allergies: ! * SHELLFISH VOLTAREN PROZAC * PAPER TAPE   EKG  Procedure date:  04/25/2009  Findings:      NSR with rate of 84 no acute changes

## 2010-05-12 NOTE — Letter (Signed)
Summary: [facility]   Imported By: Virgia Land 07/15/2009 10:30:42  _____________________________________________________________________  External Attachment:    Type:   Image     Comment:   External Document

## 2010-05-12 NOTE — Progress Notes (Signed)
Summary: pt is in hospital  Phone Note Call from Patient   Caller: Melonie [place], niece  [phone] Summary of Call: Niece reports that pt went to [facility] last evening.  She was vomiting blood and also had blood in her stool.  It was decided that her coumadin dose was too high.  She is still in hospital, in the step down unit. Initial call taken by: Marty Heck CMA,  July 09, 2009 1:24 PM  Follow-up for Phone Call        thanks for the update  please send for H and p and d/c summary when they are availible Follow-up by: Allena Earing MD,  July 09, 2009 1:54 PM  Additional Follow-up for Phone Call Additional follow up Details #1::        I went on E chart and got the ER record, some labs and the History and Physical for you. they are in your in box on the shelf. Thank you. Ozzie Hoyle LPN  March 30, 624THL 075-GRM PM

## 2010-05-12 NOTE — Progress Notes (Signed)
Summary: wants phone call   Phone Note Call from Patient Call back at [phone]   Caller: Patient Call For: Allena Earing MD Summary of Call: Patient wants to speak with you directly. She is in the [facility], orthopedic ward. She can be reached at [phone]. Initial call taken by: Lacretia Nicks,  July 14, 2009 9:27 AM  Follow-up for Phone Call        can you find out what it is regarding to --see if I need to get any records first .. also let her know I'm out of office-will lkely call her in am  Follow-up by: Allena Earing MD,  July 14, 2009 3:46 PM  Additional Follow-up for Phone Call Additional follow up Details #1::        Pt had her knee surgery and did fine. Had to be readmitted because while in skilled nursing facility had GI bleed. Pt readmitted to hospital. Pt wants to talk with you about some of her meds and to let you know what is going on. Pt said she is now diabetic. Pt said "she just wants to hear your voice." I explained you were out of office part of day and it would probably be tomorrow when you could call; she said that was fine. I got copy of discharge from hospital and readmission history and physical and last lab which is on your shelf in the in box.Ozzie Hoyle LPN  April  4, 624THL 624THL PM     Additional Follow-up for Phone Call Additional follow up Details #2::    we have talked about her mild diabetes in the office on many occasions - it likely got worse from stress enc her to ask the hosp doctors about meds she is on now --this is important since I am not in charge of her hospital care  let her know I will call her in am and I am thinking about her -- will rev her records when I get in in the am== thanks for getting them   Follow-up by: Allena Earing MD,  July 14, 2009 4:42 PM  Additional Follow-up for Phone Call Additional follow up Details #3:: Details for Additional Follow-up Action Taken: Patient notified as instructed by telephone. Ozzie Hoyle LPN  April  4, 624THL [id number] PM   called 8:22 am - no answer- will try again spoke to her at 2 pm -- doing much better - getting rest / still npo but no longer bleeding  she says her doctors are explaining things well and she is thankful for that she looks forward to going home and will f/u with me soon after d/c  I invited her to call back if any other concerns  Additional Follow-up by: Allena Earing MD,  July 15, 2009 8:13 AM

## 2010-05-12 NOTE — Miscellaneous (Signed)
Summary: Order/[facility]  Order/[facility]   Imported By: Bubba Hales 10/23/2009 12:28:54  _____________________________________________________________________  External Attachment:    Type:   Image     Comment:   External Document

## 2010-05-12 NOTE — Progress Notes (Signed)
Summary: prior needed again for omeprazole  Phone Note From Pharmacy   Caller: [facility] Summary of Call: Prior Josem Kaufmann is needed for omeprazole,  form is on your shelf.  This was done last month but for some reason needs to be done again. Initial call taken by: Marty Heck CMA, AAMA,  February 23, 2010 3:33 PM  Follow-up for Phone Call        done and in IN box Follow-up by: Allena Earing MD,  February 23, 2010 4:40 PM  Additional Follow-up for Phone Call Additional follow up Details #1::        Form faxed to [place], AAMA  February 23, 2010 4:50 PM    Prior Josem Kaufmann given for omeprazole, advised pharmacy. Additional Follow-up by: Marty Heck CMA, AAMA,  February 24, 2010 3:27 PM

## 2010-05-12 NOTE — Assessment & Plan Note (Signed)
Summary: 6MTH F/U AFTER LABS / LFW   Vital Signs:  Patient profile:   77 year old female Weight:      234 pounds BMI:     32.75 Temp:     98.2 degrees F oral Pulse rate:   88 / minute Pulse rhythm:   regular BP sitting:   142 / 68  (left arm) Cuff size:   large  Vitals Entered By: Marty Heck CMA (April 16, 2009 9:23 AM) CC: 6 month follow up   History of Present Illness: here for f/u of HTN, DM and lipids   wt is down 4 lb with bmi 32 has been really cutting back- determined not to gain  not much exercise due to knee pain-- is planning to get ready for surgery   DM has been stable with AIC 6.9 last check opthy-- was nl last exam -- Deon Pilling -- was last feb sugars 120-130 does not check in afternoon   lipids stable and fairly controled with trit 160/ HDL 50 and LDL 82  ? flu shot   occ TMJ= chewing gum a lot- ? need to stop  trouble falling asleep  wants to try melatonin  bp up a bit 142/68-- has not taken med yet     Allergies: 1)  ! * Shellfish 2)  Voltaren 3)  Prozac 4)  * Paper Tape  Past History:  Past Medical History: Last updated: 03/05/2008 Diabetes mellitus, type II Hyperlipidemia Osteoarthritis Osteoporosis HTN  obesity   Past Surgical History: Last updated: 07/26/2006 Appendectomy Cholecystectomy Colon resection secondary to cancer- 1988 Two benign polyps removed- 1998 DEXA- 2001 & 2004 Cervicitis- Conization- 1964 Endometrial polyps/hyperplasia- laser treatment- 05/2000  Family History: Last updated: 03/28/2007 no DM in family brother had lung cancer father CAD mother died at 86- did not know why  Social History: Last updated: 10/31/2007 non smoker no alcohol widowed has mentally impaired son she cares for   Review of Systems General:  Denies fatigue, fever, loss of appetite, and malaise. Eyes:  Denies blurring and eye irritation. CV:  Denies chest pain or discomfort, palpitations, shortness of breath with exertion,  and swelling of feet. Resp:  Denies cough and wheezing. GI:  Denies abdominal pain and change in bowel habits. MS:  Complains of joint pain and stiffness; denies muscle weakness. Derm:  Denies itching, lesion(s), poor wound healing, and rash. Neuro:  Denies numbness, tingling, and weakness. Psych:  mood is ok . Endo:  Denies excessive thirst and excessive urination. Heme:  Denies abnormal bruising and bleeding.  Physical Exam  General:  overweight but generally well appearing  Head:  normocephalic, atraumatic, and no abnormalities observed.   Eyes:  vision grossly intact, pupils equal, pupils round, and pupils reactive to light.   Neck:  supple with full rom and no masses or thyromegally, no JVD or carotid bruit  Lungs:  Normal respiratory effort, chest expands symmetrically. Lungs are clear to auscultation, no crackles or wheezes. Heart:  Normal rate and regular rhythm. S1 and S2 normal without gallop, murmur, click, rub or other extra sounds. Msk:  no CVA tenderness poor rom hips and LS- moving slowly today poor rom knees Pulses:  plus one pedal pulses  Extremities:  No clubbing, cyanosis, edema, or deformity noted with normal full range of motion of all joints.   Neurologic:  sensation intact to light touch, gait normal, and DTRs symmetrical and normal.  (walks with a cane) Skin:  Intact without suspicious lesions or rashes Cervical  Nodes:  No lymphadenopathy noted Psych:  normal affect, talkative and pleasant   Diabetes Management Exam:    Foot Exam (with socks and/or shoes not present):       Sensory-Pinprick/Light touch:          Left medial foot (L-4): normal          Left dorsal foot (L-5): normal          Left lateral foot (S-1): normal          Right medial foot (L-4): normal          Right dorsal foot (L-5): normal          Right lateral foot (S-1): normal       Sensory-Monofilament:          Left foot: normal          Right foot: normal       Inspection:           Left foot: normal          Right foot: normal       Nails:          Left foot: normal          Right foot: normal    Eye Exam:       Eye Exam done elsewhere          Date: 05/13/2008          Results: normal          Done by: Dr Deon Pilling   Impression & Recommendations:  Problem # 1:  HYPERTENSION, ESSENTIAL NOS (ICD-401.9) Assessment Unchanged  bp up today -- since no med this am -- expected per pt control has been good will check bp at home and update me will take med for 6 mo f/u Her updated medication list for this problem includes:    Lisinopril 5 Mg Tabs (Lisinopril) .Marland Kitchen... 1 by mouth once daily  BP today: 142/68 Prior BP: 180/58 (03/18/2009)  Labs Reviewed: K+: 4.6 (04/10/2009) Creat: : 1.3 (04/10/2009)   Chol: 165 (04/10/2009)   HDL: 50.80 (04/10/2009)   LDL: 82 (04/10/2009)   TG: 160.0 (04/10/2009)  Problem # 2:  LEG PAIN (ICD-729.5) Assessment: Comment Only pend plan for knee replacement- then will be able to exercise  Problem # 3:  HYPERLIPIDEMIA (ICD-272.4) Assessment: Unchanged  lipids in fair control -- LDL in 80s- prefer under 70 rev low sat fat diet in detail  re check 6 mo and f/u no change med  Her updated medication list for this problem includes:    Lopid 600 Mg Tabs (Gemfibrozil) ..... One by mouth two times a day  Labs Reviewed: SGOT: 21 (04/10/2009)   SGPT: 13 (04/10/2009)   HDL:50.80 (04/10/2009), 49.60 (09/17/2008)  LDL:82 (04/10/2009), 84 (09/17/2008)  Chol:165 (04/10/2009), 160 (09/17/2008)  Trig:160.0 (04/10/2009), 134.0 (09/17/2008)  Problem # 4:  DIABETES MELLITUS, TYPE II (ICD-250.00) Assessment: Unchanged overall stable with diet control urged her to check some pm sugars opthy up to date disc healthy diet (low simple sugar/ choose complex carbs/ low sat fat) diet and exercise in detail  enc further wt loss as well needs flu shot if there are any left  lab and f/u in 6 mo  Her updated medication list for this problem includes:     Ecotrin 325 Mg Tbec (Aspirin) ..... One by mouth qd    Lisinopril 5 Mg Tabs (Lisinopril) .Marland Kitchen... 1 by mouth once daily  Complete Medication List: 1)  Imipramine Hcl 50 Mg Tabs (Imipramine hcl) .... 2 by mouth at bedtime 2)  Hydrocodone-acetaminophen 5-500 Mg Tabs (Hydrocodone-acetaminophen) .Marland Kitchen.. 1 by mouth q 6 hours as needed pain 3)  Lopid 600 Mg Tabs (Gemfibrozil) .... One by mouth two times a day 4)  Ecotrin 325 Mg Tbec (Aspirin) .... One by mouth qd 5)  Calcium 600 Mg Tabs (Calcium) .... One by mouth bid 6)  Freestyle Lancets Misc (Lancets) .... Test blood sugar daily and prn 7)  Freestyle Test Strp (Glucose blood) .... Test blood sugar as directed 8)  Sea-omega 300 Mg Caps (Omega-3 fatty acids) .... Take 1 capsule by mouth three times a day 9)  Lisinopril 5 Mg Tabs (Lisinopril) .Marland Kitchen.. 1 by mouth once daily  Patient Instructions: 1)  don't forget your eye exam later this winter  2)  keep working on weight loss and diabetic diet and low fat eating  3)  no medicine changes  4)  schedule fasting lab in 6 months then f/u lipid/ast/alt/renal/cbc with diff/ tsh/ microalb/ AIC 250.0, 401.1 5)  try to increase (double) your fluid intake -- non sweet   Prior Medications (reviewed today): IMIPRAMINE HCL 50 MG TABS (IMIPRAMINE HCL) 2 by mouth at bedtime HYDROCODONE-ACETAMINOPHEN 5-500 MG  TABS (HYDROCODONE-ACETAMINOPHEN) 1 by mouth q 6 hours as needed pain LOPID 600 MG  TABS (GEMFIBROZIL) one by mouth two times a day ECOTRIN 325 MG  TBEC (ASPIRIN) one by mouth qd CALCIUM 600 MG  TABS (CALCIUM) one by mouth bid FREESTYLE LANCETS   MISC (LANCETS) test blood sugar daily and prn FREESTYLE TEST   STRP (GLUCOSE BLOOD) test blood sugar as directed SEA-OMEGA 300 MG  CAPS (OMEGA-3 FATTY ACIDS) Take 1 capsule by mouth three times a day LISINOPRIL 5 MG  TABS (LISINOPRIL) 1 by mouth once daily Current Allergies: ! * SHELLFISH VOLTAREN PROZAC * PAPER TAPE

## 2010-05-12 NOTE — Medication Information (Signed)
Summary: Prior Authorization for Omeprazole/[facility]  Prior Authorization for Omeprazole/[facility]   Imported By: Edmonia James 01/27/2010 10:51:38  _____________________________________________________________________  External Attachment:    Type:   Image     Comment:   External Document

## 2010-05-12 NOTE — Assessment & Plan Note (Signed)
Summary: F/U [facility]   Vital Signs:  Patient profile:   77 year old female Height:      71 inches Weight:      215.25 pounds BMI:     30.13 Temp:     97.4 degrees F oral Pulse rate:   96 / minute Pulse rhythm:   regular BP sitting:   114 / 66  (left arm) Cuff size:   large  Vitals Entered By: Ozzie Hoyle LPN (May  9, 624THL 075-GRM PM) CC: [facility]   History of Present Illness: had her orthopedic  surgery- knee-- that went well  R knee repl - not a lot pain - doing home tx   after- complicated with supratx inr and upper GI bleed from AV malformation--- re 5 u transfusion and d/c on iron  had brief elevation in liver tests and cr - normalized  got insulin in hosp  AIC 6.7   needs labs checked   is home after 63 days at [facility]   some heel ulcers -- happened at [facility] -- hard time getting them better  ? another ulcer on buttocks   energy level is very slowly coming back   coumadin is done - happy about that  knee incision looks good   Allergies: 1)  ! * Shellfish 2)  Voltaren 3)  Prozac 4)  * Paper Tape  Past History:  Family History: Last updated: 04/30/2009 no DM in family brother had lung cancer father MI at 12, also CVA mother died at 105- did not know why  Social History: Last updated: 04/30/2009 non smoker, quit tobacco > 10 years ago no alcohol widowed, lives with son has mentally impaired son she cares for   Past Medical History: 1. Diabetes mellitus, type II 2. Hyperlipidemia 3. Osteoarthritis 4. Osteoporosis 5. HTN  6. obesity  upper GI bleed/ AV malformation/ (when anticoag)   Past Surgical History: Appendectomy Cholecystectomy Colon resection secondary to cancer- 1988 Two benign polyps removed- 1998 DEXA- 2001 & 2004 Cervicitis- Conization- 1964 Endometrial polyps/hyperplasia- laser treatment- 05/2000 R knee replacement 4/11  upper GI bleed under anticoag (from AV malf) 4/11-- 5 u  transfusion  Review of Systems General:  Complains of fatigue; denies chills, fever, loss of appetite, and malaise. Eyes:  Denies blurring and eye irritation. CV:  Denies chest pain or discomfort, palpitations, and swelling of feet. Resp:  Denies cough, shortness of breath, and wheezing. GI:  Denies abdominal pain, bloody stools, change in bowel habits, indigestion, nausea, and vomiting. GU:  Denies dysuria, hematuria, and urinary frequency. MS:  Complains of joint swelling; denies joint pain and joint redness. Derm:  Complains of lesion(s); denies itching, poor wound healing, and rash. Neuro:  Denies headaches, numbness, and tingling. Psych:  mood is good . Endo:  Denies cold intolerance, excessive thirst, excessive urination, and heat intolerance. Heme:  Denies abnormal bruising, bleeding, enlarge lymph nodes, fevers, and pallor.  Physical Exam  General:  well but somewhat frail appearing - ambulates with walker well Head:  normocephalic, atraumatic, and no abnormalities observed.   Eyes:  vision grossly intact, pupils equal, pupils round, and pupils reactive to light.   Mouth:  pharynx pink and moist.   Neck:  supple with full rom and no masses or thyromegally, no JVD or carotid bruit  Lungs:  Normal respiratory effort, chest expands symmetrically. Lungs are clear to auscultation, no crackles or wheezes. Heart:  Normal rate and regular rhythm. S1 and S2  normal without gallop, murmur, click, rub or other extra sounds. Abdomen:  Bowel sounds positive,abdomen soft and non-tender without masses, organomegaly or hernias noted. (exam done sitting) Msk:  R knee incision healing well with minimal swelling  can partially bear wt with walker- no pain Pulses:  R and L carotid,radial,femoral,dorsalis pedis and posterior tibial pulses are full and equal bilaterally Extremities:  No clubbing, cyanosis, edema, or deformity noted with normal full range of motion of all joints.   Neurologic:   sensation intact to light touch, gait normal, and DTRs symmetrical and normal.   Skin:  early heel ulcers bilat -- no skin breakdown and minimal tenderness  small similar lesion at upper buttock cleft no signs of infx no drainage  Cervical Nodes:  No lymphadenopathy noted Inguinal Nodes:  No significant adenopathy Psych:  normal affect, talkative and pleasant    Impression & Recommendations:  Problem # 1:  ANEMIA, SECONDARY TO ACUTE BLOOD LOSS (ICD-285.1) Assessment New on iron s/p GI bleed upper from AV malf -- after supratx inr after knee proceedure is clinically imp on ferrous sulfate daily and tolerating it  lab today - cbc f/u 1 mo  Her updated medication list for this problem includes:    Ferrous Sulfate 325 (65 Fe) Mg Tabs (Ferrous sulfate) .Marland Kitchen... Take 1 tablet by mouth once a day  Orders: Venipuncture [id number]) TLB-CBC Platelet - w/Differential (85025-CBCD) TLB-Renal Function Panel (80069-RENAL) TLB-Hepatic/Liver Function Pnl (80076-HEPATIC)  Problem # 2:  HYPERTENSION, ESSENTIAL NOS (ICD-401.9) Assessment: Improved  bp is very well controlled - esp after 20 lb wt loss lab today (cr went up in hosp with gi bleed)  no change in lisinopril  The following medications were removed from the medication list:    Lisinopril 5 Mg Tabs (Lisinopril) .Marland Kitchen... 1 by mouth once daily Her updated medication list for this problem includes:    Lisinopril 10 Mg Tabs (Lisinopril) .Marland Kitchen... Take 1 tablet by mouth once a day  BP today: 114/66 Prior BP: 158/70 (04/30/2009)  Labs Reviewed: K+: 4.6 (04/10/2009) Creat: : 1.3 (04/10/2009)   Chol: 165 (04/10/2009)   HDL: 50.80 (04/10/2009)   LDL: 82 (04/10/2009)   TG: 160.0 (04/10/2009)  Problem # 3:  HYPERLIPIDEMIA (ICD-272.4) Assessment: Unchanged  pt is off lopid after ast/alt bumped inhosp ( ? if unrelated) will stay off it and watch diet  re- eval at f/u in 1 mo  The following medications were removed from the medication list:    Lopid  600 Mg Tabs (Gemfibrozil) ..... One by mouth two times a day  Orders: Venipuncture [id number]) TLB-CBC Platelet - w/Differential (85025-CBCD) TLB-Renal Function Panel (80069-RENAL) TLB-Hepatic/Liver Function Pnl (80076-HEPATIC)  Labs Reviewed: SGOT: 21 (04/10/2009)   SGPT: 13 (04/10/2009)   HDL:50.80 (04/10/2009), 49.60 (09/17/2008)  LDL:82 (04/10/2009), 84 (09/17/2008)  Chol:165 (04/10/2009), 160 (09/17/2008)  Trig:160.0 (04/10/2009), 134.0 (09/17/2008)  Problem # 4:  DIABETES MELLITUS, TYPE II (ICD-250.00) Assessment: Unchanged  AIC 6.7 - sugar went up in hosp with stress asked her to check once daily at diff times and rev at f/u  disc healthy diet (low simple sugar/ choose complex carbs/ low sat fat) diet and exercise in detail  The following medications were removed from the medication list:    Lisinopril 5 Mg Tabs (Lisinopril) .Marland Kitchen... 1 by mouth once daily Her updated medication list for this problem includes:    Ecotrin 325 Mg Tbec (Aspirin) ..... One by mouth qd    Lisinopril 10 Mg Tabs (Lisinopril) .Marland Kitchen... Take 1 tablet by mouth once  a day  Orders: Venipuncture [id number]) TLB-CBC Platelet - w/Differential (85025-CBCD) TLB-Renal Function Panel (80069-RENAL) TLB-Hepatic/Liver Function Pnl (80076-HEPATIC)  Labs Reviewed: Creat: 1.3 (04/10/2009)     Last Eye Exam: normal (05/13/2008) Reviewed HgBA1c results: 6.9 (04/10/2009)  6.8 (09/17/2008)  Problem # 5:  PRESSURE ULCER HEEL (ICD-707.07) Assessment: New  bilat early stage ulcers - bilat heels from back sleeping at [facility] also small one on buttock  given px for bactroban to use  need to offload these - will have home nurse visit soon - inst her to call and guide me in terms of boot/ matress pad needed  pt will try sleeping in diff positions   Orders: Prescription Created Electronically [phone])  Complete Medication List: 1)  Imipramine Hcl 50 Mg Tabs (Imipramine hcl) .... 2 by mouth at bedtime 2)  Ecotrin 325 Mg  Tbec (Aspirin) .... One by mouth qd 3)  Calcium 600 Mg Tabs (Calcium) .... One by mouth bid 4)  Freestyle Lancets Misc (Lancets) .... Test blood sugar daily and prn 5)  Freestyle Test Strp (Glucose blood) .... Test blood sugar as directed 6)  Sea-omega 300 Mg Caps (Omega-3 fatty acids) .... Take 1 capsule by mouth three times a day 7)  Lisinopril 10 Mg Tabs (Lisinopril) .... Take 1 tablet by mouth once a day 8)  Vitamin C 500 Mg Tabs (Ascorbic acid) .... Take 1 tablet by mouth once a day 9)  Methocarbamol 500 Mg Tabs (Methocarbamol) .... Take one tablet every 6 hours as needed 10)  Ferrous Sulfate 325 (65 Fe) Mg Tabs (Ferrous sulfate) .... Take 1 tablet by mouth once a day 11)  Omeprazole 20 Mg Cpdr (Omeprazole) .... Take 1 tablet by mouth once a day 12)  Multivitamins Tabs (Multiple vitamin) .... Take 1 tablet by mouth once a day 13)  Bactroban 2 % Oint (Mupirocin) .... Apply to affected areas on heels and buttocks two times a day  Patient Instructions: 1)  check your sugar daily - some fasting in am and some 2 hours after a meal- keep track of them  2)  eat low sugar / low fat diet  3)  use bactroban ointment on heel and buttock ulcers 4)  when nurse comes for visit - have her call me with recommendation for what kind of boot to order to offload heel pressure  5)  if more heel or buttock pain- please call me  6)  labs today  7)  follow up with me in 1-2 months  Prescriptions: BACTROBAN 2 % OINT (MUPIROCIN) apply to affected areas on heels and buttocks two times a day  #1 medium x 2   Entered and Authorized by:   Allena Earing MD   Signed by:   Allena Earing MD on 08/18/2009   Method used:   Electronically to        [facility] (retail)       [address]       [place], [place]  [address]       Ph: [phone]       Fax: [phone]   RxIDIO:[id number]   Current Allergies (reviewed today): ! * SHELLFISH VOLTAREN PROZAC * PAPER TAPE

## 2010-05-12 NOTE — Progress Notes (Signed)
Summary: uti  Phone Note Call from Patient Call back at [phone]   Caller: Patient Call For: Allena Earing MD Summary of Call: Patient states that she is out of town and she has a uti. She says that she has alot of burning w/ urination, and frequency. She says that she feels this way w/ every uti she has. She is asking if she could get something called in for this to avoid having to go to urgent care out of town. She would like it sent to [facility] on [address]. [phone] Initial call taken by: Lacretia Nicks,  December 16, 2009 1:36 PM  Follow-up for Phone Call        unfortunately I cannot call in abx for that - needs to go to urgent care so they can get ua and more importantly culture when they treat her  Follow-up by: Allena Earing MD,  December 16, 2009 1:47 PM  Additional Follow-up for Phone Call Additional follow up Details #1::        Patient notified, will go to urgent care.  Additional Follow-up by: Lacretia Nicks,  December 16, 2009 1:55 PM

## 2010-05-12 NOTE — Medication Information (Signed)
Summary: Approval for Additional Quantity Omeprazole/[facility]  Approval for Additional Quantity Omeprazole/[facility]   Imported By: Edmonia James 11/19/2009 12:41:28  _____________________________________________________________________  External Attachment:    Type:   Image     Comment:   External Document

## 2010-05-12 NOTE — Progress Notes (Signed)
Summary: needs treatment for ulcers?  Phone Note From Other Clinic   Caller: Jenny Reichmann with [facility]  [phone] Summary of Call: Pt has just been admitted to home health and during her assessment she was found to have decubitus sores on backside and both heels.  She has an appt with you on monday but home health is asking if you want them to send a nurse out over the week end to get treatment started. Initial call taken by: Marty Heck CMA,  Aug 15, 2009 8:29 AM  Follow-up for Phone Call        yes - please do that - thank you  Follow-up by: Allena Earing MD,  Aug 15, 2009 1:59 PM  Additional Follow-up for Phone Call Additional follow up Details #1::        [facility]) advised.  She says it will probably be Sunday because at this point, Saturday is "slammed".  She asked if you had any specific orders for decubitus but I advised there were none listed.  just have nurse eval and treat -- can recommend any orders on monday and I will sign --MT Additional Follow-up by: Christena Deem CMA Deborra Medina),  Aug 15, 2009 5:03 PM    Additional Follow-up for Phone Call Additional follow up Details #2::    [facility] [phone] as instructed by telephone. Ozzie Hoyle LPN  May  6, 624THL 075-GRM PM

## 2010-05-12 NOTE — Assessment & Plan Note (Signed)
Summary: Erin [name] FLU AND PNEUMIA SHOT/[facility]  Nurse Visit   Allergies: 1)  ! * Shellfish 2)  Voltaren 3)  Prozac 4)  * Paper Tape  Immunizations Administered:  Influenza Vaccine # 1:    Vaccine Type: Fluvax 3+    Site: left deltoid    Mfr: [employer]oSmith[employer]    Dose: 0.5 ml    Route: IM    Given by: Ozzie Hoyle LPN    Exp. Date: 10/09/2009    Lot #: [id number]    VIS given: 11/03/06 version given May 20, 2009.  Flu Vaccine Consent Questions:    Do you have a history of severe allergic reactions to this vaccine? no    Any prior history of allergic reactions to egg and/or gelatin? no    Do you have a sensitivity to the preservative Thimersol? no    Do you have a past history of Guillan-Barre Syndrome? no    Do you currently have an acute febrile illness? no    Have you ever had a severe reaction to latex? no    Vaccine information given and explained to patient? yes    Are you currently pregnant? no  Orders Added: 1)  Flu Vaccine 57yrs + [90658] 2)  Admin 1st Vaccine [id number]

## 2010-05-12 NOTE — Medication Information (Signed)
Summary: Approval for Additional Quantity Omeprazole/[employer]  Approval for Additional Quantity Omeprazole/[employer]   Imported By: Edmonia James 01/27/2010 10:48:26  _____________________________________________________________________  External Attachment:    Type:   Image     Comment:   External Document

## 2010-05-14 NOTE — Assessment & Plan Note (Signed)
Summary: 6 MONTH FOLLOW UP/[facility]   Vital Signs:  Patient profile:   77 year old female Height:      71 inches Weight:      236.25 pounds BMI:     33.07 Temp:     98.5 degrees F oral Pulse rate:   84 / minute Pulse rhythm:   regular BP sitting:   124 / 60  (left arm) Cuff size:   large  Vitals Entered By: Ozzie Hoyle LPN (December 12, 624THL 3:13 PM) CC: six month f/u   History of Present Illness: due for f/u of HTN and lipids and hyperglycemia   wt is up 2lb-- gained over 20 lb since hosp  ate too much because her priorities changed   HTN is in good control 124/60  last AIC under 6.5 with diet  diet -- not been good -- more sugar than she was  am sugar tends to be one teens to 120s pm sugars about the same   some exercise - limited by arthritis - cannot stand or walk too long due to back pain enjoys doing her PT exercises   lipids are due for check- diet control was on lopid in past -- then lfts bumped when in hosp overall fats are fairly low in diet  too much pizza   remote hx now of GI bleed  last cbc ok with baseline hb of 11.2 stomach is feeling ok   Allergies: 1)  ! * Shellfish 2)  Voltaren 3)  Prozac 4)  * Paper Tape  Past History:  Past Medical History: Last updated: 08/18/2009 1. Diabetes mellitus, type II 2. Hyperlipidemia 3. Osteoarthritis 4. Osteoporosis 5. HTN  6. obesity  upper GI bleed/ AV malformation/ (when anticoag)   Past Surgical History: Last updated: 08/18/2009 Appendectomy Cholecystectomy Colon resection secondary to cancer- 1988 Two benign polyps removed- 1998 DEXA- 2001 & 2004 Cervicitis- Conization- 1964 Endometrial polyps/hyperplasia- laser treatment- 05/2000 R knee replacement 4/11  upper GI bleed under anticoag (from AV malf) 4/11-- 5 u transfusion  Family History: Last updated: 04/30/2009 no DM in family brother had lung cancer father MI at 1, also CVA mother died at 2- did not know why  Social History: Last  updated: 04/30/2009 non smoker, quit tobacco > 10 years ago no alcohol widowed, lives with son has mentally impaired son she cares for   Review of Systems General:  Complains of fatigue; denies loss of appetite and malaise. Eyes:  Denies blurring and eye irritation. CV:  Denies chest pain or discomfort, lightheadness, and palpitations. Resp:  Denies cough, shortness of breath, and wheezing. GI:  Denies abdominal pain and change in bowel habits. GU:  Denies hematuria and urinary frequency. MS:  Complains of joint pain and stiffness. Derm:  Denies itching, lesion(s), poor wound healing, and rash. Neuro:  Denies numbness and tingling. Psych:  mood is ok. Endo:  Denies cold intolerance, excessive thirst, excessive urination, and heat intolerance. Heme:  Denies abnormal bruising and bleeding.  Physical Exam  General:  overweight but generally well appearing  Head:  normocephalic, atraumatic, and no abnormalities observed.   Eyes:  vision grossly intact, pupils equal, pupils round, and pupils reactive to light.  no conjunctival pallor, injection or icterus  Mouth:  pharynx pink and moist.   Neck:  supple with full rom and no masses or thyromegally, no JVD or carotid bruit  Chest Wall:  No deformities, masses, or tenderness noted. Lungs:  Normal respiratory effort, chest expands symmetrically. Lungs are  clear to auscultation, no crackles or wheezes. Heart:  Normal rate and regular rhythm. S1 and S2 normal without gallop, murmur, click, rub or other extra sounds. Abdomen:  Bowel sounds positive,abdomen soft and non-tender without organomegaly noted. + midline incision scar.  L inferior scar below umbilicus 4in ventral hernia present with sitting/standing, resolves with supine.  nontender.  reducible. Msk:  No deformity or scoliosis noted of thoracic or lumbar spine.  poor rom LS  Pulses:  2+ rad pulses Extremities:  no pedal edema Neurologic:  sensation intact to light touch and DTRs  symmetrical and normal.  gait is steady with a cane Skin:  Intact without suspicious lesions or rashes Cervical Nodes:  No lymphadenopathy noted Inguinal Nodes:  No significant adenopathy Psych:  normal affect, talkative and pleasant    Impression & Recommendations:  Problem # 1:  HYPERGLYCEMIA (ICD-790.29) Assessment Unchanged  even with wt gain and worse diet- home sugars seem to be running excellent  disc healthy diet (low simple sugar/ choose complex carbs/ low sat fat) diet and exercise in detail  [facility] today lab and check up in 6 months  Orders: Venipuncture [id number]) TLB-Lipid Panel (80061-LIPID) TLB-ALT (SGPT) (84460-ALT) TLB-AST (SGOT) (84450-SGOT) TLB-CBC Platelet - w/Differential (85025-CBCD) TLB-A1C / Hgb A1C (Glycohemoglobin) (83036-A1C)  Labs Reviewed: Creat: 1.1 (09/18/2009)     Last Eye Exam: normal (05/13/2008)  Problem # 2:  ABDOMINAL WALL HERNIA (ICD-553.20) Assessment: Unchanged no change on exam today will update me if bigger/ painful or any other change  Problem # 3:  HYPERTENSION, ESSENTIAL NOS (ICD-401.9) Assessment: Unchanged  good bp control on ace no changes will work on wt loss  Her updated medication list for this problem includes:    Lisinopril 10 Mg Tabs (Lisinopril) .Marland Kitchen... Take 1 tablet by mouth once a day  Orders: Venipuncture [id number]) TLB-Lipid Panel (80061-LIPID) TLB-ALT (SGPT) (84460-ALT) TLB-AST (SGOT) (84450-SGOT) TLB-CBC Platelet - w/Differential (85025-CBCD) TLB-A1C / Hgb A1C (Glycohemoglobin) (83036-A1C)  BP today: 124/60 Prior BP: 140/68 (02/26/2010)  Labs Reviewed: K+: 4.4 (09/18/2009) Creat: : 1.1 (09/18/2009)   Chol: 260 (09/18/2009)   HDL: 52.40 (09/18/2009)   LDL: 82 (04/10/2009)   TG: 312.0 (09/18/2009)  Problem # 4:  HYPERLIPIDEMIA (ICD-272.4) Assessment: Unchanged  lipids today-- off lopid (after bump in lft in hosp) will adv with results rev low sat fat diet  Orders: Venipuncture [id number]) TLB-Lipid Panel  (80061-LIPID) TLB-ALT (SGPT) (84460-ALT) TLB-AST (SGOT) (84450-SGOT) TLB-CBC Platelet - w/Differential (85025-CBCD) TLB-A1C / Hgb A1C (Glycohemoglobin) (83036-A1C)  Labs Reviewed: SGOT: 23 (08/18/2009)   SGPT: 12 (08/18/2009)   HDL:52.40 (09/18/2009), 50.80 (04/10/2009)  LDL:82 (04/10/2009), 84 (09/17/2008)  Chol:260 (09/18/2009), 165 (04/10/2009)  Trig:312.0 (09/18/2009), 160.0 (04/10/2009)  Complete Medication List: 1)  Imipramine Hcl 50 Mg Tabs (Imipramine hcl) .... 2 by mouth at bedtime 2)  Ecotrin 325 Mg Tbec (Aspirin) .... One by mouth daily 3)  Calcium 600 Mg Tabs (Calcium) .... One by mouth twice a day 4)  Freestyle Lancets Misc (Lancets) .... Test blood sugar daily and prn 5)  Freestyle Test Strp (Glucose blood) .... Test blood sugar as directed 6)  Sea-omega 300 Mg Caps (Omega-3 fatty acids) .... Take 1 capsule by mouth three times a day 7)  Lisinopril 10 Mg Tabs (Lisinopril) .... Take 1 tablet by mouth once a day 8)  Vitamin C 500 Mg Tabs (Ascorbic acid) .... Take 1 tablet by mouth once a day 9)  Methocarbamol 500 Mg Tabs (Methocarbamol) .... Take one tablet every 6 hours as needed 10)  Ferrous Sulfate  325 (65 Fe) Mg Tabs (Ferrous sulfate) .... Take 1 tablet by mouth once a day 11)  Omeprazole 20 Mg Cpdr (Omeprazole) .... Take 1 tablet by mouth once a day 12)  Multivitamins Tabs (Multiple vitamin) .... Take 1 tablet by mouth once a day  Other Orders: Flu Vaccine 75yrs + MEDICARE PATIENTS [id number]) Administration Flu vaccine - MCR [id number])  Patient Instructions: 1)  work on weight loss again -- smaller portions and no junk food or sugars  2)  avoid pizza or other fatty foods (you can raise your HDL (good cholesterol) by increasing exercise and eating omega 3 fatty acid supplement like fish oil or flax seed oil over the counter 3)  you can lower LDL (bad cholesterol) by limiting saturated fats in diet like red meat, fried foods, egg yolks, fatty breakfast meats, high fat dairy  products and shellfish )  4)  flu shot today  5)  labs toay  6)  schedule fasting labs and then 30 minute check up in 6 months  7)  lipid/ast/alt/renal /cbc with diff/ tsh / vit D level/ AIC for 272, hyperglycemia/ anemia / 401.1 and 733.0   Orders Added: 1)  Flu Vaccine 60yrs + MEDICARE PATIENTS [Q2039] 2)  Administration Flu vaccine - MCR [G0008] 3)  Venipuncture [36415] 4)  TLB-Lipid Panel [80061-LIPID] 5)  TLB-ALT (SGPT) [84460-ALT] 6)  TLB-AST (SGOT) [84450-SGOT] 7)  TLB-CBC Platelet - w/Differential [85025-CBCD] 8)  TLB-A1C / Hgb A1C (Glycohemoglobin) [83036-A1C] 9)  Est. Patient Level IV [id number]    Current Allergies (reviewed today): ! * SHELLFISH VOLTAREN PROZAC * PAPER TAPE    Flu Vaccine Consent Questions     Do you have a history of severe allergic reactions to this vaccine? no    Any prior history of allergic reactions to egg and/or gelatin? no    Do you have a sensitivity to the preservative Thimersol? no    Do you have a past history of Guillan-Barre Syndrome? no    Do you currently have an acute febrile illness? no    Have you ever had a severe reaction to latex? no    Vaccine information given and explained to patient? yes    Are you currently pregnant? no    Lot Number:[id number]   Exp Date:10/10/2010   Site Given  Left Deltoid [id number]  Ozzie Hoyle LPN  December 12, 624THL 3:17 PM

## 2010-05-14 NOTE — Assessment & Plan Note (Signed)
Summary: 12:00NOON PROD COUGH,WHEEZING/RI   Vital Signs:  Patient profile:   77 year old female Weight:      238.25 pounds O2 Sat:      98 % on Room air Temp:     98.3 degrees F oral Pulse rate:   84 / minute Pulse rhythm:   regular BP sitting:   130 / 62  (left arm) Cuff size:   large  Vitals Entered By: Maudie Mercury Dance CMA (AAMA) (March 30, 2010 11:45 AM)  O2 Flow:  Room air CC: Cough/Wheezing x1 day   History of Present Illness: CC: cough/wheezing  1d h/o cough/wheezing.  Bringing up yellow sputum.  + nausea.  no fevers/chills.  + nasal congestion.  Hasn't tried anything for this so far.  No fevers/chills.  No abd pain, v/d, ST, rashes, myalgias, arthralgias.  R Knee doing fine - 05/2009 replacement surgery.  No sick contacts at home.  No h/o asthma, no smoking hx.  borderline DM.  No dx sinus issues, but thinks cough coming from sinuses.  No PNdrip.  Current Medications (verified): 1)  Imipramine Hcl 50 Mg Tabs (Imipramine Hcl) .... 2 By Mouth At Bedtime 2)  Ecotrin 325 Mg  Tbec (Aspirin) .... One By Mouth Daily 3)  Calcium 600 Mg  Tabs (Calcium) .... One By Mouth Twice A Day 4)  Freestyle Lancets   Misc (Lancets) .... Test Blood Sugar Daily and Prn 5)  Freestyle Test   Strp (Glucose Blood) .... Test Blood Sugar As Directed 6)  Sea-Omega 300 Mg  Caps (Omega-3 Fatty Acids) .... Take 1 Capsule By Mouth Three Times A Day 7)  Lisinopril 10 Mg Tabs (Lisinopril) .... Take 1 Tablet By Mouth Once A Day 8)  Vitamin C 500 Mg  Tabs (Ascorbic Acid) .... Take 1 Tablet By Mouth Once A Day 9)  Methocarbamol 500 Mg Tabs (Methocarbamol) .... Take One Tablet Every 6 Hours As Needed 10)  Ferrous Sulfate 325 (65 Fe) Mg  Tabs (Ferrous Sulfate) .... Take 1 Tablet By Mouth Once A Day 11)  Omeprazole 20 Mg Cpdr (Omeprazole) .... Take 1 Tablet By Mouth Once A Day 12)  Multivitamins   Tabs (Multiple Vitamin) .... Take 1 Tablet By Mouth Once A Day  Allergies: 1)  ! * Shellfish 2)  Voltaren 3)   Prozac 4)  * Paper Tape  Past History:  Past Medical History: Last updated: 08/18/2009 1. Diabetes mellitus, type II 2. Hyperlipidemia 3. Osteoarthritis 4. Osteoporosis 5. HTN  6. obesity  upper GI bleed/ AV malformation/ (when anticoag)   Social History: Last updated: 04/30/2009 non smoker, quit tobacco > 10 years ago no alcohol widowed, lives with son has mentally impaired son she cares for   Review of Systems       per HPI  Physical Exam  General:  overweight but generally well appearing  Head:  normocephalic, atraumatic, and no abnormalities observed.   Eyes:  vision grossly intact, pupils equal, pupils round, and pupils reactive to light.  no conjunctival pallor, injection or icterus  Ears:  TMs clear, congestion bilaterally Nose:  nares clear bilaterally Mouth:  pharynx pink and moist.   Neck:  supple with full rom and no masses or thyromegally, no JVD or carotid bruit no LAD Lungs:  Normal respiratory effort, chest expands symmetrically. Lungs are clear to auscultation, no crackles or wheezes.  some crackles bibasilarly Heart:  Normal rate and regular rhythm. S1 and S2 normal without gallop, murmur, click, rub or other extra sounds.  Pulses:  2+ rad pulses Extremities:  no pedal edema Skin:  Intact without suspicious lesions or rashes   Impression & Recommendations:  Problem # 1:  ACUTE BRONCHITIS (ICD-466.0) in 77 yo with comorbidities, some crackles on exam.  advised likely viral, however if not imrpoving as expected, to fill zpack and take.  would want to ensure not becoming more serious.  red flags tor return discussed  Her updated medication list for this problem includes:    Zithromax Z-pak 250 Mg Tabs (Azithromycin) ..... Use as directed  Complete Medication List: 1)  Imipramine Hcl 50 Mg Tabs (Imipramine hcl) .... 2 by mouth at bedtime 2)  Ecotrin 325 Mg Tbec (Aspirin) .... One by mouth daily 3)  Calcium 600 Mg Tabs (Calcium) .... One by mouth twice  a day 4)  Freestyle Lancets Misc (Lancets) .... Test blood sugar daily and prn 5)  Freestyle Test Strp (Glucose blood) .... Test blood sugar as directed 6)  Sea-omega 300 Mg Caps (Omega-3 fatty acids) .... Take 1 capsule by mouth three times a day 7)  Lisinopril 10 Mg Tabs (Lisinopril) .... Take 1 tablet by mouth once a day 8)  Vitamin C 500 Mg Tabs (Ascorbic acid) .... Take 1 tablet by mouth once a day 9)  Methocarbamol 500 Mg Tabs (Methocarbamol) .... Take one tablet every 6 hours as needed 10)  Ferrous Sulfate 325 (65 Fe) Mg Tabs (Ferrous sulfate) .... Take 1 tablet by mouth once a day 11)  Omeprazole 20 Mg Cpdr (Omeprazole) .... Take 1 tablet by mouth once a day 12)  Multivitamins Tabs (Multiple vitamin) .... Take 1 tablet by mouth once a day 13)  Zithromax Z-pak 250 Mg Tabs (Azithromycin) .... Use as directed  Patient Instructions: 1)  Sounds like you have a viral upper respiratory infection. 2)  Antibiotics are not needed for this.  Viral infections usually take 7-10 days to resolve.  The cough can last 4 weeks to go away. 3)  Use medication as prescribed: azithromcyin to hold in case not better in a few days with below measures. 4)  Guaifenesin 400mg  IR 1 1/2 pills twice daily with plenty of water to help mobilize mucous. 5)  Push fluids and plenty of rest. 6)  Please return if you are not improving as expected, or if you have high fevers (>101.5) or difficulty swallowing. 7)  Call clinic with questions.  Pleasure to see you today!  Prescriptions: ZITHROMAX Z-PAK 250 MG TABS (AZITHROMYCIN) use as directed  #1 x 0   Entered and Authorized by:   Ria Bush  MD   Signed by:   Ria Bush  MD on 03/30/2010   Method used:   Print then Give to Patient   RxID:   [id number]    Orders Added: 1)  Est. Patient Level III [id number]    Current Allergies (reviewed today): ! * SHELLFISH VOLTAREN PROZAC * PAPER TAPE

## 2010-06-09 ENCOUNTER — Telehealth: Payer: Self-pay | Admitting: Family Medicine

## 2010-06-09 ENCOUNTER — Encounter: Payer: Self-pay | Admitting: Family Medicine

## 2010-06-09 ENCOUNTER — Ambulatory Visit (INDEPENDENT_AMBULATORY_CARE_PROVIDER_SITE_OTHER): Payer: Medicare Other | Admitting: Family Medicine

## 2010-06-09 DIAGNOSIS — IMO0002 Reserved for concepts with insufficient information to code with codable children: Secondary | ICD-10-CM | POA: Insufficient documentation

## 2010-06-10 ENCOUNTER — Encounter: Payer: Self-pay | Admitting: Family Medicine

## 2010-06-18 NOTE — Progress Notes (Signed)
Summary: hadicapped placard   Phone Note Call from Patient Call back at Home Phone [phone] P PH     Caller: Patient Call For: Allena Earing MD Summary of Call: Patient dropped of Hadicapped Pleacard application. Form is on your desk. Initial call taken by: Lacretia Nicks,  June 09, 2010 3:52 PM  Follow-up for Phone Call        form done and in nurse in box  Follow-up by: Allena Earing MD,  June 09, 2010 4:23 PM  Additional Follow-up for Phone Call Additional follow up Details #1::        Patient notified as instructed by telephone. Form left at front desk. Copy of form to be scanned here.Ozzie Hoyle LPN  February 29, [id number] 3:01 PM

## 2010-06-18 NOTE — Assessment & Plan Note (Signed)
Summary: BRUISE ON CHEEK,BACK OF R HAND,LEFT THUMB FROM FALL TODAY/CLE...   Vital Signs:  Patient profile:   77 year old female Weight:      234.25 pounds Temp:     98.1 degrees F oral Pulse rate:   88 / minute Pulse rhythm:   regular BP sitting:   140 / 70  (left arm) Cuff size:   large  Vitals Entered By: Emelia Salisbury LPN (February 28, [id number] 4:11 PM) CC: [place] this morning, has bruise on cheek, cuts and abrasions on back of right hand and left thumb from fall. Has seen Dr. Noemi Chapel earlier for him to check her knees out.   History of Present Illness: [place] this AM at  ~9:30.  Went to put something in the trunk and slipped.  Fell to pavement.  No LOC.  Talked to Dr. Noemi Chapel about R knee today.  Xrays of knees were okay per patient.  Told to follow up here about facial and hand lesions  Sugar has been controlled.  ~100 in AM    Allergies: 1)  ! * Shellfish 2)  Voltaren 3)  Prozac 4)  * Paper Tape  Review of Systems       See HPI.  Otherwise negative.    Physical Exam  General:  no apparent distress black eye on R side puffy and tender under the R eye Small abrasions on R side of face tm wnl, nasal and oral exam w/o acute changes perrl, eomi neck supple, no la, no midline pain regular rate and rhythm ctab Small abrasions on R 4th, 5th MCP, L thumb abrasion at IP.   normal range of motion for shoulders, elbows No motor, neuro, vasc deficit for the hands.     Impression & Recommendations:  Problem # 1:  ABRASION, HAND (ICD-914.0) tdap up to date.  Would irrigated and covered.  No need to stitch.  Local would care only for superficial abrasions.  No needs for oral antibiotics.   Problem # 2:  ABRASION, FACE (ICD-910.0) cleaned with saline.  I would ice this down to help with swelling.  I see no reason to image as risk of fx is very low.  follow up as needed.  pain presently controlled wiht tylenol.  She agrees with plan.    Complete Medication List: 1)  Imipramine Hcl  50 Mg Tabs (Imipramine hcl) .... 2 by mouth at bedtime 2)  Ecotrin 325 Mg Tbec (Aspirin) .... One by mouth daily 3)  Calcium 600 Mg Tabs (Calcium) .... One by mouth twice a day 4)  Freestyle Lancets Misc (Lancets) .... Test blood sugar daily and prn 5)  Freestyle Test Strp (Glucose blood) .... Test blood sugar as directed 6)  Sea-omega 300 Mg Caps (Omega-3 fatty acids) .... Take 1 capsule by mouth three times a day 7)  Lisinopril 10 Mg Tabs (Lisinopril) .... Take 1 tablet by mouth once a day 8)  Vitamin C 500 Mg Tabs (Ascorbic acid) .... Take 1 tablet by mouth once a day 9)  Methocarbamol 500 Mg Tabs (Methocarbamol) .... Take one tablet every 6 hours as needed 10)  Ferrous Sulfate 325 (65 Fe) Mg Tabs (Ferrous sulfate) .... Take 1 tablet by mouth once a day 11)  Omeprazole 20 Mg Cpdr (Omeprazole) .... Take 1 tablet by mouth once a day 12)  Multivitamins Tabs (Multiple vitamin) .... Take 1 tablet by mouth once a day  Patient Instructions: 1)  I would take tylenol three times a day for the  pain as needed.  I would get an ice bag for your face to help with the swelling.  Keep the spots on your hands covered with neosporin and a bandage.  The swelling should gradually go down, but you'll likey have a bigger black eye on the right for a few days.  Take care.    Orders Added: 1)  Est. Patient Level III [id number]    Current Allergies (reviewed today): ! * SHELLFISH VOLTAREN PROZAC * PAPER TAPE

## 2010-06-18 NOTE — Letter (Signed)
Summary: Application for Handicapped Placard Form  Application for Handicapped Placard Form   Imported By: Virgia Land 06/10/2010 15:23:58  _____________________________________________________________________  External Attachment:    Type:   Image     Comment:   External Document

## 2010-06-23 NOTE — Letter (Signed)
Summary: [facility]  [facility]   Imported By: Jamelle Haring 06/15/2010 09:30:26  _____________________________________________________________________  External Attachment:    Type:   Image     Comment:   External Document

## 2010-07-01 LAB — URINALYSIS, ROUTINE W REFLEX MICROSCOPIC
Bilirubin Urine: NEGATIVE
Glucose, UA: NEGATIVE mg/dL
Glucose, UA: NEGATIVE mg/dL
Hgb urine dipstick: NEGATIVE
Nitrite: NEGATIVE
Specific Gravity, Urine: 1.016 (ref 1.005–1.030)
Specific Gravity, Urine: 1.025 (ref 1.005–1.030)
pH: 6 (ref 5.0–8.0)
pH: 6.5 (ref 5.0–8.0)

## 2010-07-01 LAB — GLUCOSE, CAPILLARY
Glucose-Capillary: 114 mg/dL — ABNORMAL HIGH (ref 70–99)
Glucose-Capillary: 118 mg/dL — ABNORMAL HIGH (ref 70–99)
Glucose-Capillary: 121 mg/dL — ABNORMAL HIGH (ref 70–99)
Glucose-Capillary: 121 mg/dL — ABNORMAL HIGH (ref 70–99)
Glucose-Capillary: 124 mg/dL — ABNORMAL HIGH (ref 70–99)
Glucose-Capillary: 124 mg/dL — ABNORMAL HIGH (ref 70–99)
Glucose-Capillary: 127 mg/dL — ABNORMAL HIGH (ref 70–99)
Glucose-Capillary: 134 mg/dL — ABNORMAL HIGH (ref 70–99)
Glucose-Capillary: 138 mg/dL — ABNORMAL HIGH (ref 70–99)
Glucose-Capillary: 140 mg/dL — ABNORMAL HIGH (ref 70–99)
Glucose-Capillary: 147 mg/dL — ABNORMAL HIGH (ref 70–99)
Glucose-Capillary: 150 mg/dL — ABNORMAL HIGH (ref 70–99)
Glucose-Capillary: 165 mg/dL — ABNORMAL HIGH (ref 70–99)
Glucose-Capillary: 90 mg/dL (ref 70–99)
Glucose-Capillary: 95 mg/dL (ref 70–99)
Glucose-Capillary: 97 mg/dL (ref 70–99)
Glucose-Capillary: 98 mg/dL (ref 70–99)
Glucose-Capillary: 99 mg/dL (ref 70–99)

## 2010-07-01 LAB — COMPREHENSIVE METABOLIC PANEL
ALT: 103 U/L — ABNORMAL HIGH (ref 0–35)
ALT: 18 U/L (ref 0–35)
ALT: 64 U/L — ABNORMAL HIGH (ref 0–35)
ALT: 80 U/L — ABNORMAL HIGH (ref 0–35)
ALT: 99 U/L — ABNORMAL HIGH (ref 0–35)
AST: 106 U/L — ABNORMAL HIGH (ref 0–37)
AST: 171 U/L — ABNORMAL HIGH (ref 0–37)
AST: 32 U/L (ref 0–37)
AST: 44 U/L — ABNORMAL HIGH (ref 0–37)
AST: 52 U/L — ABNORMAL HIGH (ref 0–37)
AST: 74 U/L — ABNORMAL HIGH (ref 0–37)
Albumin: 2.8 g/dL — ABNORMAL LOW (ref 3.5–5.2)
Albumin: 3 g/dL — ABNORMAL LOW (ref 3.5–5.2)
Albumin: 3.2 g/dL — ABNORMAL LOW (ref 3.5–5.2)
Alkaline Phosphatase: 71 U/L (ref 39–117)
Alkaline Phosphatase: 72 U/L (ref 39–117)
Alkaline Phosphatase: 72 U/L (ref 39–117)
Alkaline Phosphatase: 84 U/L (ref 39–117)
CO2: 20 mEq/L (ref 19–32)
CO2: 21 mEq/L (ref 19–32)
CO2: 22 mEq/L (ref 19–32)
CO2: 22 mEq/L (ref 19–32)
Calcium: 10.1 mg/dL (ref 8.4–10.5)
Calcium: 9 mg/dL (ref 8.4–10.5)
Calcium: 9.3 mg/dL (ref 8.4–10.5)
Chloride: 103 mEq/L (ref 96–112)
Chloride: 103 mEq/L (ref 96–112)
Chloride: 105 mEq/L (ref 96–112)
Chloride: 107 mEq/L (ref 96–112)
Chloride: 107 mEq/L (ref 96–112)
Creatinine, Ser: 0.93 mg/dL (ref 0.4–1.2)
Creatinine, Ser: 0.94 mg/dL (ref 0.4–1.2)
Creatinine, Ser: 1.12 mg/dL (ref 0.4–1.2)
GFR calc Af Amer: >60 mL/min (ref >60)
GFR calc Af Amer: >60 mL/min (ref >60)
GFR calc Af Amer: >60 mL/min (ref >60)
GFR calc Af Amer: >60 mL/min (ref >60)
GFR calc non Af Amer: 58 mL/min — ABNORMAL LOW (ref >60)
GFR calc non Af Amer: >60 mL/min (ref >60)
GFR calc non Af Amer: >60 mL/min (ref >60)
Glucose, Bld: 112 mg/dL — ABNORMAL HIGH (ref 70–99)
Glucose, Bld: 146 mg/dL — ABNORMAL HIGH (ref 70–99)
Potassium: 3.5 mEq/L (ref 3.5–5.1)
Potassium: 3.6 mEq/L (ref 3.5–5.1)
Potassium: 3.8 mEq/L (ref 3.5–5.1)
Potassium: 4.2 mEq/L (ref 3.5–5.1)
Potassium: 4.3 mEq/L (ref 3.5–5.1)
Sodium: 130 mEq/L — ABNORMAL LOW (ref 135–145)
Sodium: 134 mEq/L — ABNORMAL LOW (ref 135–145)
Sodium: 134 mEq/L — ABNORMAL LOW (ref 135–145)
Sodium: 135 mEq/L (ref 135–145)
Total Bilirubin: 0.5 mg/dL (ref 0.3–1.2)
Total Bilirubin: 0.5 mg/dL (ref 0.3–1.2)
Total Bilirubin: 0.6 mg/dL (ref 0.3–1.2)
Total Protein: 6.5 g/dL (ref 6.0–8.3)
Total Protein: 7 g/dL (ref 6.0–8.3)
Total Protein: 7.7 g/dL (ref 6.0–8.3)

## 2010-07-01 LAB — CBC
HCT: 36.5 % (ref 36.0–46.0)
Hemoglobin: 12.1 g/dL (ref 12.0–15.0)
Hemoglobin: 9.2 g/dL — ABNORMAL LOW (ref 12.0–15.0)
Hemoglobin: 9.3 g/dL — ABNORMAL LOW (ref 12.0–15.0)
MCHC: 33.6 g/dL (ref 30.0–36.0)
MCHC: 33.9 g/dL (ref 30.0–36.0)
MCHC: 34.8 g/dL (ref 30.0–36.0)
MCV: 90.4 fL (ref 78.0–100.0)
MCV: 91.3 fL (ref 78.0–100.0)
Platelets: 237 10*3/uL (ref 150–400)
Platelets: 249 10*3/uL (ref 150–400)
Platelets: 301 10*3/uL (ref 150–400)
RBC: 2.87 MIL/uL — ABNORMAL LOW (ref 3.87–5.11)
RBC: 2.93 MIL/uL — ABNORMAL LOW (ref 3.87–5.11)
RBC: 3.04 MIL/uL — ABNORMAL LOW (ref 3.87–5.11)
RBC: 3.05 MIL/uL — ABNORMAL LOW (ref 3.87–5.11)
RBC: 3.25 MIL/uL — ABNORMAL LOW (ref 3.87–5.11)
RBC: 4.25 MIL/uL (ref 3.87–5.11)
RDW: 17.4 % — ABNORMAL HIGH (ref 11.5–15.5)
WBC: 7.1 10*3/uL (ref 4.0–10.5)
WBC: 7.8 10*3/uL (ref 4.0–10.5)
WBC: 8.5 10*3/uL (ref 4.0–10.5)
WBC: 8.8 10*3/uL (ref 4.0–10.5)
WBC: 9.4 10*3/uL (ref 4.0–10.5)

## 2010-07-01 LAB — HEMOGLOBIN AND HEMATOCRIT, BLOOD
HCT: 27.3 % — ABNORMAL LOW (ref 36.0–46.0)
HCT: 27.5 % — ABNORMAL LOW (ref 36.0–46.0)
HCT: 30.4 % — ABNORMAL LOW (ref 36.0–46.0)
Hemoglobin: 10.4 g/dL — ABNORMAL LOW (ref 12.0–15.0)
Hemoglobin: 10.7 g/dL — ABNORMAL LOW (ref 12.0–15.0)

## 2010-07-01 LAB — LIPID PANEL
LDL Cholesterol: 48 mg/dL (ref 0–99)
Total CHOL/HDL Ratio: 2.8 RATIO
Triglycerides: 124 mg/dL (ref <150)
VLDL: 25 mg/dL (ref 0–40)

## 2010-07-01 LAB — URINE MICROSCOPIC-ADD ON

## 2010-07-01 LAB — ABO/RH: ABO/RH(D): B NEG

## 2010-07-01 LAB — LIPASE, BLOOD: Lipase: 261 U/L — ABNORMAL HIGH (ref 11–59)

## 2010-07-01 LAB — DIFFERENTIAL
Eosinophils Absolute: 0 10*3/uL (ref 0.0–0.7)
Eosinophils Relative: 0 % (ref 0–5)
Lymphocytes Relative: 34 % (ref 12–46)
Lymphs Abs: 2.6 10*3/uL (ref 0.7–4.0)
Monocytes Relative: 9 % (ref 3–12)
Neutrophils Relative %: 57 % (ref 43–77)

## 2010-07-01 LAB — URINE CULTURE: Colony Count: NO GROWTH

## 2010-07-01 LAB — PROTIME-INR
INR: 1.02 (ref 0.00–1.49)
INR: 1.26 (ref 0.00–1.49)
INR: 1.27 (ref 0.00–1.49)
Prothrombin Time: 13.3 seconds (ref 11.6–15.2)
Prothrombin Time: 15.8 seconds — ABNORMAL HIGH (ref 11.6–15.2)

## 2010-07-01 LAB — HEPATITIS PANEL, ACUTE
HCV Ab: NEGATIVE
Hep A IgM: NEGATIVE
Hep B C IgM: NEGATIVE
Hepatitis B Surface Ag: NEGATIVE

## 2010-07-01 LAB — AMYLASE
Amylase: 177 U/L — ABNORMAL HIGH (ref 0–105)
Amylase: 235 U/L — ABNORMAL HIGH (ref 0–105)

## 2010-07-01 LAB — HEMOCCULT GUIAC POC 1CARD (OFFICE): Fecal Occult Bld: POSITIVE

## 2010-07-01 LAB — TYPE AND SCREEN
ABO/RH(D): B NEG
Antibody Screen: NEGATIVE

## 2010-07-01 LAB — BASIC METABOLIC PANEL
CO2: 23 mEq/L (ref 19–32)
Calcium: 8.8 mg/dL (ref 8.4–10.5)
Creatinine, Ser: 0.99 mg/dL (ref 0.4–1.2)
GFR calc Af Amer: >60 mL/min (ref >60)
GFR calc non Af Amer: 54 mL/min — ABNORMAL LOW (ref >60)
Sodium: 133 mEq/L — ABNORMAL LOW (ref 135–145)

## 2010-07-05 LAB — GLUCOSE, CAPILLARY
Glucose-Capillary: 110 mg/dL — ABNORMAL HIGH (ref 70–99)
Glucose-Capillary: 115 mg/dL — ABNORMAL HIGH (ref 70–99)
Glucose-Capillary: 119 mg/dL — ABNORMAL HIGH (ref 70–99)
Glucose-Capillary: 119 mg/dL — ABNORMAL HIGH (ref 70–99)
Glucose-Capillary: 121 mg/dL — ABNORMAL HIGH (ref 70–99)
Glucose-Capillary: 123 mg/dL — ABNORMAL HIGH (ref 70–99)
Glucose-Capillary: 125 mg/dL — ABNORMAL HIGH (ref 70–99)
Glucose-Capillary: 126 mg/dL — ABNORMAL HIGH (ref 70–99)
Glucose-Capillary: 132 mg/dL — ABNORMAL HIGH (ref 70–99)
Glucose-Capillary: 135 mg/dL — ABNORMAL HIGH (ref 70–99)
Glucose-Capillary: 139 mg/dL — ABNORMAL HIGH (ref 70–99)
Glucose-Capillary: 143 mg/dL — ABNORMAL HIGH (ref 70–99)

## 2010-07-05 LAB — CROSSMATCH
ABO/RH(D): B NEG
Antibody Screen: NEGATIVE

## 2010-07-05 LAB — DIFFERENTIAL
Eosinophils Relative: 0 % (ref 0–5)
Lymphocytes Relative: 11 % — ABNORMAL LOW (ref 12–46)
Lymphs Abs: 1.4 10*3/uL (ref 0.7–4.0)
Monocytes Relative: 6 % (ref 3–12)
Neutrophils Relative %: 82 % — ABNORMAL HIGH (ref 43–77)

## 2010-07-05 LAB — CBC
HCT: 27.2 % — ABNORMAL LOW (ref 36.0–46.0)
HCT: 29.4 % — ABNORMAL LOW (ref 36.0–46.0)
Hemoglobin: 10.1 g/dL — ABNORMAL LOW (ref 12.0–15.0)
Hemoglobin: 8.6 g/dL — ABNORMAL LOW (ref 12.0–15.0)
Hemoglobin: 9.2 g/dL — ABNORMAL LOW (ref 12.0–15.0)
MCHC: 33.6 g/dL (ref 30.0–36.0)
MCHC: 34.3 g/dL (ref 30.0–36.0)
MCV: 86.4 fL (ref 78.0–100.0)
MCV: 87.8 fL (ref 78.0–100.0)
Platelets: 168 10*3/uL (ref 150–400)
Platelets: 276 10*3/uL (ref 150–400)
RBC: 2.95 MIL/uL — ABNORMAL LOW (ref 3.87–5.11)
RDW: 13.9 % (ref 11.5–15.5)
RDW: 14.6 % (ref 11.5–15.5)
WBC: 10.7 10*3/uL — ABNORMAL HIGH (ref 4.0–10.5)
WBC: 13.3 10*3/uL — ABNORMAL HIGH (ref 4.0–10.5)

## 2010-07-05 LAB — BASIC METABOLIC PANEL
BUN: 45 mg/dL — ABNORMAL HIGH (ref 6–23)
BUN: 7 mg/dL (ref 6–23)
BUN: 7 mg/dL (ref 6–23)
CO2: 23 mEq/L (ref 19–32)
CO2: 25 mEq/L (ref 19–32)
Calcium: 8.6 mg/dL (ref 8.4–10.5)
Calcium: 9 mg/dL (ref 8.4–10.5)
Calcium: 9.2 mg/dL (ref 8.4–10.5)
Chloride: 100 mEq/L (ref 96–112)
Chloride: 100 mEq/L (ref 96–112)
Chloride: 103 mEq/L (ref 96–112)
Creatinine, Ser: 0.9 mg/dL (ref 0.4–1.2)
Creatinine, Ser: 1.35 mg/dL — ABNORMAL HIGH (ref 0.4–1.2)
GFR calc Af Amer: >60 mL/min (ref >60)
GFR calc Af Amer: >60 mL/min (ref >60)
GFR calc non Af Amer: 38 mL/min — ABNORMAL LOW (ref >60)
GFR calc non Af Amer: 54 mL/min — ABNORMAL LOW (ref >60)
Glucose, Bld: 121 mg/dL — ABNORMAL HIGH (ref 70–99)
Glucose, Bld: 136 mg/dL — ABNORMAL HIGH (ref 70–99)
Glucose, Bld: 143 mg/dL — ABNORMAL HIGH (ref 70–99)
Glucose, Bld: 144 mg/dL — ABNORMAL HIGH (ref 70–99)
Potassium: 3.9 mEq/L (ref 3.5–5.1)
Potassium: 4 mEq/L (ref 3.5–5.1)
Potassium: 4 mEq/L (ref 3.5–5.1)
Sodium: 131 mEq/L — ABNORMAL LOW (ref 135–145)
Sodium: 131 mEq/L — ABNORMAL LOW (ref 135–145)
Sodium: 131 mEq/L — ABNORMAL LOW (ref 135–145)
Sodium: 133 mEq/L — ABNORMAL LOW (ref 135–145)

## 2010-07-05 LAB — PROTIME-INR
INR: 1.29 (ref 0.00–1.49)
INR: 1.57 — ABNORMAL HIGH (ref 0.00–1.49)
INR: 1.58 — ABNORMAL HIGH (ref 0.00–1.49)
INR: 4.16 — ABNORMAL HIGH (ref 0.00–1.49)
Prothrombin Time: 15.2 seconds (ref 11.6–15.2)
Prothrombin Time: 16 seconds — ABNORMAL HIGH (ref 11.6–15.2)
Prothrombin Time: 18.7 seconds — ABNORMAL HIGH (ref 11.6–15.2)
Prothrombin Time: 26.4 seconds — ABNORMAL HIGH (ref 11.6–15.2)
Prothrombin Time: 39.9 seconds — ABNORMAL HIGH (ref 11.6–15.2)

## 2010-07-05 LAB — PREPARE FRESH FROZEN PLASMA

## 2010-07-05 LAB — URINALYSIS, ROUTINE W REFLEX MICROSCOPIC
Glucose, UA: NEGATIVE mg/dL
Ketones, ur: NEGATIVE mg/dL
Protein, ur: NEGATIVE mg/dL

## 2010-07-05 LAB — COMPREHENSIVE METABOLIC PANEL
AST: 33 U/L (ref 0–37)
CO2: 22 mEq/L (ref 19–32)
Calcium: 9.9 mg/dL (ref 8.4–10.5)
Creatinine, Ser: 1.87 mg/dL — ABNORMAL HIGH (ref 0.4–1.2)
GFR calc Af Amer: 32 mL/min — ABNORMAL LOW (ref >60)
GFR calc non Af Amer: 26 mL/min — ABNORMAL LOW (ref >60)
Total Protein: 7.8 g/dL (ref 6.0–8.3)

## 2010-07-05 LAB — CARDIAC PANEL(CRET KIN+CKTOT+MB+TROPI)
CK, MB: 1.2 ng/mL (ref 0.3–4.0)
Relative Index: INVALID (ref 0.0–2.5)
Total CK: 71 U/L (ref 7–177)
Total CK: 84 U/L (ref 7–177)
Troponin I: 0.01 ng/mL (ref 0.00–0.06)

## 2010-07-05 LAB — URINE CULTURE

## 2010-07-05 LAB — HEMOGLOBIN AND HEMATOCRIT, BLOOD
HCT: 25 % — ABNORMAL LOW (ref 36.0–46.0)
HCT: 25.2 % — ABNORMAL LOW (ref 36.0–46.0)
HCT: 25.3 % — ABNORMAL LOW (ref 36.0–46.0)
Hemoglobin: 8.7 g/dL — ABNORMAL LOW (ref 12.0–15.0)

## 2010-07-05 LAB — CULTURE, BLOOD (ROUTINE X 2): Culture: NO GROWTH

## 2010-07-05 LAB — HEMOGLOBIN A1C: Mean Plasma Glucose: 146 mg/dL

## 2010-07-05 LAB — MRSA PCR SCREENING: MRSA by PCR: NEGATIVE

## 2010-08-14 ENCOUNTER — Ambulatory Visit (INDEPENDENT_AMBULATORY_CARE_PROVIDER_SITE_OTHER): Payer: Medicare Other | Admitting: Internal Medicine

## 2010-08-14 ENCOUNTER — Encounter: Payer: Self-pay | Admitting: Internal Medicine

## 2010-08-14 VITALS — BP 128/101 | HR 71 | Temp 98.3°F | Ht 70.5 in | Wt 237.1 lb

## 2010-08-14 DIAGNOSIS — N390 Urinary tract infection, site not specified: Secondary | ICD-10-CM

## 2010-08-14 DIAGNOSIS — J069 Acute upper respiratory infection, unspecified: Secondary | ICD-10-CM

## 2010-08-14 LAB — HM DIABETES FOOT EXAM

## 2010-08-14 LAB — HM COLONOSCOPY

## 2010-08-14 LAB — HM MAMMOGRAPHY

## 2010-08-14 MED ORDER — AMOXICILLIN 500 MG PO TABS: 1000.0000 mg | ORAL_TABLET | Freq: Two times a day (BID) | ORAL | Status: AC

## 2010-08-14 NOTE — Progress Notes (Signed)
Subjective:    Patient ID: Erin [name], female    DOB: 1928-11-14, 77 y.o.   MRN: [id number]  HPI Feels she has UTI Having burning dysuria. Some increased frequency Urgency with small voids No hematuria now Some low back pain--chronic No meds for this  Also with cough--dry Started yesterday  Not really sick No allergies NO heartburn No SOB Some sore throat but no ear pain  Current outpatient prescriptions:Ascorbic Acid (VITAMIN C) 500 MG tablet, Take 500 mg by mouth daily.  , Disp: , Rfl: ;  aspirin 325 MG EC tablet, Take 325 mg by mouth daily.  , Disp: , Rfl: ;  calcium carbonate (OS-CAL) 600 MG TABS, Take 600 mg by mouth 2 (two) times daily with a meal.  , Disp: , Rfl: ;  ferrous sulfate 325 (65 FE) MG tablet, Take 325 mg by mouth daily with breakfast.  , Disp: , Rfl:  glucose blood test strip, (Freestyle) Use as instructed , Disp: , Rfl: ;  imipramine (TOFRANIL) 50 MG tablet, Take two tablets at bedtime , Disp: , Rfl: ;  Lancets Misc. MISC, (Freestyle) Use as directed , Disp: , Rfl: ;  lisinopril (PRINIVIL,ZESTRIL) 10 MG tablet, Take 10 mg by mouth daily.  , Disp: , Rfl: ;  methocarbamol (ROBAXIN) 500 MG tablet, Take 500 mg by mouth every 6 (six) hours.  , Disp: , Rfl:  Multiple Vitamin (MULTIVITAMIN) capsule, Take 1 capsule by mouth daily.  , Disp: , Rfl: ;  Omega-3 Fatty Acids 300 MG CAPS, Take 1 capsule by mouth 3 (three) times daily.  , Disp: , Rfl: ;  omeprazole (PRILOSEC) 20 MG capsule, Take 20 mg by mouth daily.  , Disp: , Rfl:   Past Medical History  Diagnosis Date  . Diabetes mellitus   . Hyperlipidemia   . Osteoarthritis   . Osteoporosis   . Hypertension   . Obesity   . Upper GI bleed     AV malformation/when anticoag    Past Surgical History  Procedure Date  . Appendectomy   . Cholecystectomy   . Colon resection 1988    secondary to cancer  . Polypectomy 1998    benign X 2  . Cervicitis 1964    conization   . Total knee arthroplasty 07/2009    right   .  Endometrial polyp 05/2000    hyperplasia-laser treatment    Family History  Problem Relation Age of Onset  . Stroke Father   . Cancer Brother     lung    History   Social History  . Marital Status: Widowed    Spouse Name: N/A    Number of Children: N/A  . Years of Education: N/A   Occupational History  . Not on file.   Social History Main Topics  . Smoking status: Former Smoker    Quit date: 08/13/2000  . Smokeless tobacco: Not on file  . Alcohol Use: No  . Drug Use:   . Sexually Active:    Other Topics Concern  . Not on file   Social History Narrative   Lives with son who is mentally impaired   Review of Systems No nausea or vomiting Appetite is okay    Objective:   Physical Exam  Constitutional: She appears well-developed and well-nourished. No distress.  HENT:  Head: Normocephalic and atraumatic.  Right Ear: External ear normal.  Left Ear: External ear normal.  Mouth/Throat: Oropharynx is clear and moist. No oropharyngeal exudate.       ?  slight maxillary tenderness Mild but mostly pale nasal congestion  Neck: Normal range of motion. No thyromegaly present.  Pulmonary/Chest: Effort normal and breath sounds normal. No respiratory distress. She has no wheezes. She has no rales.  Abdominal: Soft. There is no tenderness.  Musculoskeletal:       No CVA tenderness  Lymphadenopathy:    She has no cervical adenopathy.          Assessment & Plan:

## 2010-09-02 ENCOUNTER — Telehealth: Payer: Self-pay | Admitting: *Deleted

## 2010-09-02 NOTE — Telephone Encounter (Signed)
Pt called this afternoon complaining of a possible UTI- has pain, burning, blood in urine.  Wanted to bring in sample to check.  Advised pt that she would need office visit but there are none available today or tomorrow.  Offered one on Friday but she doesn't want to wait that long.  I suggested she might want to go to urgent care this evening to be seen.

## 2010-09-02 NOTE — Telephone Encounter (Signed)
I agree- should go to urgent care if no appts avail tomorrow (I am not in office tom) Thanks

## 2010-09-02 NOTE — Telephone Encounter (Signed)
Left message for pt to call back on home phone. Pt not accepting calls on cell.

## 2010-09-03 NOTE — Telephone Encounter (Signed)
Patient notified as instructed by telephone. Pt did go to urgent care yesterday and since starting med feels much better. Pt will call our office back if symptoms not completely gone when finishes med.

## 2010-09-23 ENCOUNTER — Other Ambulatory Visit (INDEPENDENT_AMBULATORY_CARE_PROVIDER_SITE_OTHER): Payer: Medicare Other | Admitting: Family Medicine

## 2010-09-23 DIAGNOSIS — I10 Essential (primary) hypertension: Secondary | ICD-10-CM

## 2010-09-23 DIAGNOSIS — D649 Anemia, unspecified: Secondary | ICD-10-CM

## 2010-09-23 DIAGNOSIS — R7309 Other abnormal glucose: Secondary | ICD-10-CM

## 2010-09-23 DIAGNOSIS — E7800 Pure hypercholesterolemia, unspecified: Secondary | ICD-10-CM

## 2010-09-23 DIAGNOSIS — R739 Hyperglycemia, unspecified: Secondary | ICD-10-CM

## 2010-09-23 LAB — CBC WITH DIFFERENTIAL/PLATELET
Basophils Absolute: 0 10*3/uL (ref 0.0–0.1)
Eosinophils Relative: 0.8 % (ref 0.0–5.0)
MCV: 86.6 fl (ref 78.0–100.0)
Monocytes Absolute: 0.4 10*3/uL (ref 0.1–1.0)
Monocytes Relative: 6.5 % (ref 3.0–12.0)
Neutrophils Relative %: 57.9 % (ref 43.0–77.0)
Platelets: 185 10*3/uL (ref 150.0–400.0)
RDW: 14.3 % (ref 11.5–14.6)
WBC: 6.7 10*3/uL (ref 4.5–10.5)

## 2010-09-23 LAB — TSH: TSH: 2.76 u[IU]/mL (ref 0.35–5.50)

## 2010-09-23 LAB — LIPID PANEL: Total CHOL/HDL Ratio: 5

## 2010-09-23 LAB — RENAL FUNCTION PANEL
Creatinine, Ser: 1.2 mg/dL (ref 0.4–1.2)
Glucose, Bld: 125 mg/dL — ABNORMAL HIGH (ref 70–99)
Phosphorus: 3.7 mg/dL (ref 2.3–4.6)
Potassium: 4.2 mEq/L (ref 3.5–5.1)
Sodium: 140 mEq/L (ref 135–145)

## 2010-09-23 LAB — AST: AST: 26 U/L (ref 0–37)

## 2010-09-23 LAB — HEMOGLOBIN A1C: Hgb A1c MFr Bld: 6.9 % — ABNORMAL HIGH (ref 4.6–6.5)

## 2010-09-24 LAB — VITAMIN D 25 HYDROXY (VIT D DEFICIENCY, FRACTURES): Vit D, 25-Hydroxy: 29 ng/mL — ABNORMAL LOW (ref 30–89)

## 2010-09-26 ENCOUNTER — Encounter: Payer: Self-pay | Admitting: Family Medicine

## 2010-09-29 ENCOUNTER — Other Ambulatory Visit: Payer: Self-pay | Admitting: *Deleted

## 2010-09-29 MED ORDER — FERROUS SULFATE 325 (65 FE) MG PO TABS: 325.0000 mg | ORAL_TABLET | Freq: Every day | ORAL | Status: DC

## 2010-09-30 ENCOUNTER — Ambulatory Visit (INDEPENDENT_AMBULATORY_CARE_PROVIDER_SITE_OTHER): Payer: Medicare Other | Admitting: Family Medicine

## 2010-09-30 ENCOUNTER — Encounter: Payer: Self-pay | Admitting: Family Medicine

## 2010-09-30 DIAGNOSIS — M810 Age-related osteoporosis without current pathological fracture: Secondary | ICD-10-CM

## 2010-09-30 DIAGNOSIS — Z1231 Encounter for screening mammogram for malignant neoplasm of breast: Secondary | ICD-10-CM

## 2010-09-30 DIAGNOSIS — Z780 Asymptomatic menopausal state: Secondary | ICD-10-CM | POA: Insufficient documentation

## 2010-09-30 DIAGNOSIS — I10 Essential (primary) hypertension: Secondary | ICD-10-CM

## 2010-09-30 DIAGNOSIS — E785 Hyperlipidemia, unspecified: Secondary | ICD-10-CM

## 2010-09-30 DIAGNOSIS — R7309 Other abnormal glucose: Secondary | ICD-10-CM

## 2010-09-30 DIAGNOSIS — E119 Type 2 diabetes mellitus without complications: Secondary | ICD-10-CM

## 2010-09-30 DIAGNOSIS — D62 Acute posthemorrhagic anemia: Secondary | ICD-10-CM

## 2010-09-30 DIAGNOSIS — R7303 Prediabetes: Secondary | ICD-10-CM | POA: Insufficient documentation

## 2010-09-30 MED ORDER — ATORVASTATIN CALCIUM 10 MG PO TABS: 10.0000 mg | ORAL_TABLET | Freq: Every day | ORAL | Status: DC

## 2010-09-30 NOTE — Patient Instructions (Signed)
Check some sugars in am and some in pm (in pm 2 hours after a meal) Stick to low fat and low sugar diet We will do opthy referral at check out  If you are interested in shingles vaccine in future - call your insurance company to see how coverage is and call us to schedule  We will refer you for mammogram and dexa t check out  Increase calcium supplement to twice daily  Add 1000 iu vitamin D3 over the counter daily  Start lipitor 10 mg generic once daily in evening  Schedule fasting lab 6 weeks  Follow up in 6 months with lab prior

## 2010-09-30 NOTE — Assessment & Plan Note (Signed)
sched dexa Disc safety- use of walker Disc ca - inc to bid Also add D 1000 iu -is slt low

## 2010-09-30 NOTE — Telephone Encounter (Signed)
Opened in error

## 2010-09-30 NOTE — Assessment & Plan Note (Signed)
Chol up despite good diet  Trial of generic lipitor and re check lab 6 weeks Rev lab with pt  Rev low sat fat diet  Lab 6 wk

## 2010-09-30 NOTE — Assessment & Plan Note (Signed)
Good control- no changes Rev labs with pt  Enc activity

## 2010-09-30 NOTE — Assessment & Plan Note (Signed)
This is slt worse Will check sugar in ams - to see if higher Disc low glycemic diet Rev lab Ref to opthy for annual DM eye exam  utd imms- considering zostavax

## 2010-09-30 NOTE — Assessment & Plan Note (Signed)
Nl exam  Enc self exams Mam scheduled

## 2010-09-30 NOTE — Progress Notes (Signed)
Subjective:    Patient ID: Erin [name], female    DOB: 10-03-1928, 77 y.o.   MRN: [id number]  HPI Here for check up of chronic medical problems and to rev health mt list   Zoster status- is not interested  ptx 08 Flu shot utd Td08  colonosc 9/10 - recommended 5 year f/u No polyps this time   Mam 2/10- did not get in 2011 Wants to set that up  Has not noticed any lumps Self exam   Gyn -- past hx of conization in 1960s and endo polyp in 2/02 ?pap-- not lately for any reason  No symptoms Wants to skip exam today   Hyperglycemia / DM a1c is 6.9 up from 6.8 Afternoon sugars have been way below 120 for the most part- one 130  ? Am sugars  Diet- pretty good - better than it was / more gas from produce  No exercise at all for the most part -- chronic pain  Just ordered a dvd for chair exercises  Still in a lot of pain - especially in her back  opthy due ? - has been about a year   Lipids are up with LDL 165 up from 110 No meds Lab Results  Component Value Date   CHOL 320* 09/23/2010   CHOL 238* 03/23/2010   CHOL 260* 09/18/2009   Lab Results  Component Value Date   HDL 58.70 09/23/2010   HDL 48.70 03/23/2010   HDL 52.40 09/18/2009   Lab Results  Component Value Date   LDLCALC  Value: 48        Total Cholesterol/HDL:CHD Risk Coronary Heart Disease Risk Table                     Men   Women  1/2 Average Risk   3.4   3.3  Average Risk       5.0   4.4  2 X Average Risk   9.6   7.1  3 X Average Risk  23.4   11.0        Use the calculated Patient Ratio above and the CHD Risk Table to determine the patient's CHD Risk.        ATP III CLASSIFICATION (LDL):  <100     mg/dL   Optimal  100-129  mg/dL   Near or Above                    Optimal  130-159  mg/dL   Borderline  160-189  mg/dL   High  >190     mg/dL   Very High 07/16/2009   LDLCALC 82 04/10/2009   LDLCALC 84 09/17/2008   Lab Results  Component Value Date   TRIG 237.0* 09/23/2010   TRIG 190.0* 03/23/2010   TRIG 312.0* 09/18/2009     Lab Results  Component Value Date   CHOLHDL 5 09/23/2010   CHOLHDL 5 03/23/2010   CHOLHDL 5 09/18/2009   Lab Results  Component Value Date   LDLDIRECT 165.7 09/23/2010   LDLDIRECT 110.1 03/23/2010   LDLDIRECT 105.4 09/18/2009  this went up significantly- but has not really changed diet- in fact doing well   Thyroid ok  OP- ? Last dexa- needs to schedule that  Ca and D D low at 24 D is just what is in her calcium- no more than that (once daily )   HTN in good control wth 130/68 No cp or ha or palp  Patient  Active Problem List  Diagnoses  . HYPERLIPIDEMIA  . OBESITY  . ANEMIA, SECONDARY TO ACUTE BLOOD LOSS  . HYPERTENSION, ESSENTIAL NOS  . PHLEBITIS, SUPERFICIAL LEG VEINS  . HEMORRHOIDS  . ABDOMINAL WALL HERNIA  . PRESSURE ULCER HEEL  . OSTEOARTHRITIS  . OSTEOPOROSIS  . SLEEP DISORDER  . Other screening mammogram  . DM2 (diabetes mellitus, type 2)  . Post-menopausal   Past Medical History  Diagnosis Date  . Diabetes mellitus   . Hyperlipidemia   . Osteoarthritis   . Osteoporosis   . Hypertension   . Obesity   . Upper GI bleed     AV malformation/when anticoag   Past Surgical History  Procedure Date  . Appendectomy   . Cholecystectomy   . Colon resection 1988    secondary to cancer  . Polypectomy 1998    benign X 2  . Cervicitis 1964    conization   . Total knee arthroplasty 07/2009    right   . Endometrial polyp 05/2000    hyperplasia-laser treatment   History  Substance Use Topics  . Smoking status: Former Smoker    Quit date: 08/13/2000  . Smokeless tobacco: Never Used   Comment: Quit over 10 years ago  . Alcohol Use: No   Family History  Problem Relation Age of Onset  . Stroke Father   . Cancer Brother     lung   Allergies  Allergen Reactions  . Diclofenac Sodium     REACTION: vomiting  . Fluoxetine Hcl     REACTION: vomiting   Current Outpatient Prescriptions on File Prior to Visit  Medication Sig Dispense Refill  . Ascorbic Acid  (VITAMIN C) 500 MG tablet Take 500 mg by mouth daily.        Marland Kitchen aspirin 325 MG EC tablet Take 325 mg by mouth daily.        . calcium carbonate (OS-CAL) 600 MG TABS Take 600 mg by mouth 2 (two) times daily with a meal.        . ferrous sulfate 325 (65 FE) MG tablet Take 1 tablet (325 mg total) by mouth daily with breakfast.  30 tablet  0  . glucose blood test strip (Freestyle) Use as instructed       . Lancets Misc. MISC (Freestyle) Use as directed       . Multiple Vitamin (MULTIVITAMIN) capsule Take 1 capsule by mouth daily.        . Omega-3 Fatty Acids 300 MG CAPS Take 1 capsule by mouth 3 (three) times daily.             Review of Systems Review of Systems  Constitutional: Negative for fever, appetite change, fatigue and unexpected weight change.  Eyes: Negative for pain and visual disturbance.  Respiratory: Negative for cough and shortness of breath.   Cardiovascular: Negative.  for cp or palp Gastrointestinal: Negative for nausea, diarrhea and constipation.  Genitourinary: Negative for urgency and frequency.  Skin: Negative for pallor.  MSK pos for back and joint pain  Neurological: Negative for weakness, light-headedness, numbness and headaches.  Hematological: Negative for adenopathy. Does not bruise/bleed easily.  Psychiatric/Behavioral: Negative for dysphoric mood. The patient is not nervous/anxious.          Objective:   Physical Exam  Constitutional: She appears well-developed and well-nourished. No distress.       overwt and well appearing   HENT:  Head: Normocephalic and atraumatic.  Nose: Nose normal.  Mouth/Throat: Oropharynx is  clear and moist.  Eyes: Conjunctivae and EOM are normal. Pupils are equal, round, and reactive to light.  Neck: Normal range of motion. Neck supple. No JVD present. No thyromegaly present.  Cardiovascular: Normal rate, regular rhythm, normal heart sounds and intact distal pulses.   Pulmonary/Chest: Effort normal and breath sounds normal.  She has no wheezes.  Abdominal: Soft. Bowel sounds are normal. She exhibits no distension and no mass. There is no tenderness.  Genitourinary: No breast swelling, tenderness, discharge or bleeding.  Musculoskeletal: Normal range of motion. She exhibits tenderness. She exhibits no edema.       Some LS tenderness  Lymphadenopathy:    She has no cervical adenopathy.  Neurological: She is alert. She has normal strength and normal reflexes. No cranial nerve deficit or sensory deficit. Coordination normal.  Skin: Skin is warm and dry. No rash noted. No erythema. No pallor.  Psychiatric: She has a normal mood and affect.          Assessment & Plan:

## 2010-09-30 NOTE — Assessment & Plan Note (Signed)
Now resolved with diet / iron suppl

## 2010-10-01 ENCOUNTER — Other Ambulatory Visit: Payer: Self-pay | Admitting: *Deleted

## 2010-10-01 MED ORDER — OMEPRAZOLE 20 MG PO CPDR: 20.0000 mg | DELAYED_RELEASE_CAPSULE | Freq: Every day | ORAL | Status: DC

## 2010-10-01 MED ORDER — LISINOPRIL 10 MG PO TABS: 10.0000 mg | ORAL_TABLET | Freq: Every day | ORAL | Status: DC

## 2010-10-01 MED ORDER — METHOCARBAMOL 500 MG PO TABS: 500.0000 mg | ORAL_TABLET | Freq: Four times a day (QID) | ORAL | Status: DC

## 2010-10-02 ENCOUNTER — Other Ambulatory Visit: Payer: Self-pay | Admitting: *Deleted

## 2010-10-02 MED ORDER — IMIPRAMINE HCL 50 MG PO TABS: ORAL_TABLET | ORAL | Status: DC

## 2010-10-05 ENCOUNTER — Other Ambulatory Visit: Payer: Self-pay | Admitting: *Deleted

## 2010-10-05 MED ORDER — IMIPRAMINE HCL 50 MG PO TABS: ORAL_TABLET | ORAL | Status: DC

## 2010-10-13 ENCOUNTER — Ambulatory Visit: Payer: Medicare Other

## 2010-10-13 ENCOUNTER — Inpatient Hospital Stay: Admission: RE | Admit: 2010-10-13 | Payer: Medicare Other | Source: Ambulatory Visit

## 2010-11-03 ENCOUNTER — Other Ambulatory Visit: Payer: Self-pay | Admitting: *Deleted

## 2010-11-03 ENCOUNTER — Telehealth: Payer: Self-pay | Admitting: *Deleted

## 2010-11-03 MED ORDER — FERROUS SULFATE 325 (65 FE) MG PO TABS: 325.0000 mg | ORAL_TABLET | Freq: Every day | ORAL | Status: DC

## 2010-11-03 NOTE — Telephone Encounter (Signed)
Form is done  Indicated pt has persistent gerd and also past hx of GI bleed so needs to stay on med  In IN box

## 2010-11-03 NOTE — Telephone Encounter (Signed)
Prior Erin [name] is needed for omeprazole, form is on your shelf. This is a renewal.

## 2010-11-03 NOTE — Telephone Encounter (Signed)
Completed form faxed to [phone] as instructed. Form given back to [place]. Copy retained at my desk if needed.

## 2010-11-04 NOTE — Telephone Encounter (Signed)
Prior auth given for omeprazole, advised pharmacy.  Approval letter placed on doctor's desk for signature and scanning.

## 2010-11-10 ENCOUNTER — Ambulatory Visit
Admission: RE | Admit: 2010-11-10 | Discharge: 2010-11-10 | Disposition: A | Discharge status: Home / Self Care | Payer: Medicare Other | Source: Ambulatory Visit | Attending: Family Medicine | Admitting: Family Medicine

## 2010-11-10 DIAGNOSIS — Z780 Asymptomatic menopausal state: Secondary | ICD-10-CM

## 2010-11-10 DIAGNOSIS — M810 Age-related osteoporosis without current pathological fracture: Secondary | ICD-10-CM

## 2010-11-10 DIAGNOSIS — Z1231 Encounter for screening mammogram for malignant neoplasm of breast: Secondary | ICD-10-CM

## 2010-11-10 LAB — HM DEXA SCAN

## 2010-11-13 ENCOUNTER — Other Ambulatory Visit (INDEPENDENT_AMBULATORY_CARE_PROVIDER_SITE_OTHER): Payer: Medicare Other | Admitting: Family Medicine

## 2010-11-13 DIAGNOSIS — E785 Hyperlipidemia, unspecified: Secondary | ICD-10-CM

## 2010-11-13 LAB — LIPID PANEL
Cholesterol: 137 mg/dL (ref 0–200)
LDL Cholesterol: 51 mg/dL (ref 0–99)
Total CHOL/HDL Ratio: 3
Triglycerides: 177 mg/dL — ABNORMAL HIGH (ref 0.0–149.0)

## 2010-11-13 LAB — AST: AST: 26 U/L (ref 0–37)

## 2010-11-25 ENCOUNTER — Telehealth: Payer: Self-pay

## 2010-11-25 NOTE — Telephone Encounter (Signed)
Lugene took a call from pt. that she has not received a report on her labs done 11/13/10. The lab results are in the pt's chart under lab tab. Pt can be reached at [phone].

## 2010-11-26 NOTE — Telephone Encounter (Signed)
Please let her know that cholesterol is much much better  Stay on med and keep watching diet

## 2010-11-26 NOTE — Telephone Encounter (Signed)
Patient notified as instructed by telephone. 

## 2010-11-27 NOTE — Telephone Encounter (Signed)
Noted and followed up on lab report.

## 2010-11-30 ENCOUNTER — Telehealth: Payer: Self-pay | Admitting: *Deleted

## 2010-11-30 DIAGNOSIS — N390 Urinary tract infection, site not specified: Secondary | ICD-10-CM | POA: Insufficient documentation

## 2010-11-30 NOTE — Telephone Encounter (Signed)
Thanks , I will be waiting on records and do the referral for frequent uti

## 2010-11-30 NOTE — Telephone Encounter (Signed)
Pt was seen at an urgent care for UTI, she is on abx now. She says Urgent care Dr (Dr Mardee Postin) advised she contact you for urology referral since she has had 2 uti's within the past month. She was seen at [facility] [phone]) and they should be sending over records.

## 2010-12-04 ENCOUNTER — Telehealth: Payer: Self-pay

## 2010-12-04 NOTE — Telephone Encounter (Signed)
Pt has appt to see urologist on 12/09/10 and has problems voiding at doctor's offices. Pt would like to come by and pick up sterile urine container. I told pt that would be fine I will give her a sterile urine container and wipe.

## 2011-01-08 ENCOUNTER — Other Ambulatory Visit: Payer: Self-pay | Admitting: *Deleted

## 2011-01-08 MED ORDER — LANCETS MISC. MISC: Status: DC

## 2011-01-08 MED ORDER — IMIPRAMINE HCL 50 MG PO TABS: ORAL_TABLET | ORAL | Status: DC

## 2011-01-08 NOTE — Telephone Encounter (Signed)
Will refill electronically  

## 2011-01-08 NOTE — Telephone Encounter (Signed)
OK to refill

## 2011-02-15 ENCOUNTER — Other Ambulatory Visit: Payer: Self-pay

## 2011-02-15 MED ORDER — GLUCOSE BLOOD VI STRP: ORAL_STRIP | Status: DC

## 2011-02-15 NOTE — Telephone Encounter (Signed)
[facility] faxed refill request glucose test strips # 100 x 1.Pt already scheduled appt in 03/2011.

## 2011-03-28 ENCOUNTER — Telehealth: Payer: Self-pay | Admitting: Family Medicine

## 2011-03-28 DIAGNOSIS — I10 Essential (primary) hypertension: Secondary | ICD-10-CM

## 2011-03-28 DIAGNOSIS — M810 Age-related osteoporosis without current pathological fracture: Secondary | ICD-10-CM

## 2011-03-28 DIAGNOSIS — E119 Type 2 diabetes mellitus without complications: Secondary | ICD-10-CM

## 2011-03-28 DIAGNOSIS — D62 Acute posthemorrhagic anemia: Secondary | ICD-10-CM

## 2011-03-28 DIAGNOSIS — E785 Hyperlipidemia, unspecified: Secondary | ICD-10-CM

## 2011-03-28 NOTE — Telephone Encounter (Signed)
Message copied by Abner Greenspan on Sun Mar 28, 2011  4:39 PM ------      Message from: Marchia Bond      Created: Mon Mar 22, 2011  8:33 AM      Regarding: F/u labs Mon 12/17       Please order  future f/u labs for pt's upcomming lab appt.      Thanks      Aniceto Boss

## 2011-03-29 ENCOUNTER — Other Ambulatory Visit (INDEPENDENT_AMBULATORY_CARE_PROVIDER_SITE_OTHER): Payer: Medicare Other

## 2011-03-29 DIAGNOSIS — D62 Acute posthemorrhagic anemia: Secondary | ICD-10-CM

## 2011-03-29 DIAGNOSIS — M810 Age-related osteoporosis without current pathological fracture: Secondary | ICD-10-CM

## 2011-03-29 DIAGNOSIS — I10 Essential (primary) hypertension: Secondary | ICD-10-CM

## 2011-03-29 DIAGNOSIS — E785 Hyperlipidemia, unspecified: Secondary | ICD-10-CM

## 2011-03-29 DIAGNOSIS — E119 Type 2 diabetes mellitus without complications: Secondary | ICD-10-CM

## 2011-03-29 LAB — COMPREHENSIVE METABOLIC PANEL
ALT: 14 U/L (ref 0–35)
AST: 23 U/L (ref 0–37)
Albumin: 3.9 g/dL (ref 3.5–5.2)
Alkaline Phosphatase: 69 U/L (ref 39–117)
BUN: 21 mg/dL (ref 6–23)
Calcium: 9.2 mg/dL (ref 8.4–10.5)
Chloride: 109 mEq/L (ref 96–112)
Potassium: 4.2 mEq/L (ref 3.5–5.1)
Sodium: 141 mEq/L (ref 135–145)
Total Protein: 7.4 g/dL (ref 6.0–8.3)

## 2011-03-29 LAB — CBC WITH DIFFERENTIAL/PLATELET
Basophils Absolute: 0 10*3/uL (ref 0.0–0.1)
Basophils Relative: 0.3 % (ref 0.0–3.0)
Eosinophils Absolute: 0 10*3/uL (ref 0.0–0.7)
Lymphocytes Relative: 35.7 % (ref 12.0–46.0)
MCHC: 33.4 g/dL (ref 30.0–36.0)
MCV: 87.5 fl (ref 78.0–100.0)
Monocytes Absolute: 0.4 10*3/uL (ref 0.1–1.0)
Neutrophils Relative %: 57.8 % (ref 43.0–77.0)
Platelets: 173 10*3/uL (ref 150.0–400.0)
RBC: 4.04 Mil/uL (ref 3.87–5.11)
RDW: 14.7 % — ABNORMAL HIGH (ref 11.5–14.6)

## 2011-03-29 LAB — LIPID PANEL
Cholesterol: 144 mg/dL (ref 0–200)
LDL Cholesterol: 62 mg/dL (ref 0–99)
Total CHOL/HDL Ratio: 3
VLDL: 33 mg/dL (ref 0.0–40.0)

## 2011-03-30 LAB — VITAMIN D 25 HYDROXY (VIT D DEFICIENCY, FRACTURES): Vit D, 25-Hydroxy: 42 ng/mL (ref 30–89)

## 2011-04-02 ENCOUNTER — Ambulatory Visit (INDEPENDENT_AMBULATORY_CARE_PROVIDER_SITE_OTHER): Payer: Medicare Other | Admitting: Family Medicine

## 2011-04-02 ENCOUNTER — Encounter: Payer: Self-pay | Admitting: Family Medicine

## 2011-04-02 VITALS — BP 142/70 | HR 88 | Temp 98.7°F | Ht 71.0 in | Wt 238.2 lb

## 2011-04-02 DIAGNOSIS — M545 Low back pain, unspecified: Secondary | ICD-10-CM

## 2011-04-02 DIAGNOSIS — D62 Acute posthemorrhagic anemia: Secondary | ICD-10-CM

## 2011-04-02 DIAGNOSIS — Z23 Encounter for immunization: Secondary | ICD-10-CM

## 2011-04-02 DIAGNOSIS — I10 Essential (primary) hypertension: Secondary | ICD-10-CM

## 2011-04-02 DIAGNOSIS — E119 Type 2 diabetes mellitus without complications: Secondary | ICD-10-CM

## 2011-04-02 DIAGNOSIS — E785 Hyperlipidemia, unspecified: Secondary | ICD-10-CM

## 2011-04-02 NOTE — Progress Notes (Signed)
Subjective:    Patient ID: Erin [name], female    DOB: 1929/01/04, 77 y.o.   MRN: [id number]  HPI Here for f/u of lipid/ HTN/Dm and anemia  Is doing well now   Had a little stomach virus- took pepto and better now  Never vomited/ was bloated   Low back is really bothering her  Has not been to ortho about it  ? If has disk dz in back  No numbness or weakness  Is interested in seeing Dr Lorelei Pont after the holidays  Not much exercise due to back   Wt is down 2 lb with bmi of 33 Has been watching diet   Cr is 1.3 - unsure if she drinks enough fluids  Fluid intake - water or light lemonade -- usually 32 oz cup -- about 4 of those per day  Leans toward water   Lipids in good control with lipitor and diet  Lab Results  Component Value Date   CHOL 144 03/29/2011   HDL 48.70 03/29/2011   LDLCALC 62 03/29/2011   LDLDIRECT 165.7 09/23/2010   TRIG 165.0* 03/29/2011   CHOLHDL 3 03/29/2011   diet-- better and also tolerates lipitor well   bp is     142/70 Today No cp or palpitations or headaches or edema  No side effects to medicines   Is on ace  DM is stable Lab Results  Component Value Date   HGBA1C 6.8* 03/29/2011   opthy On ace  Is diet controlled -- she works on it - avoids any sweets , and careful about carbs in general  Sugars 130s or below in ams , in the afernoon -- 119-130s also (one was 167) Wt stable - not able to exercise   Anemia -on iron Hb is 11.8- down a bit --cannot eat red due to cholesterol Some leafy greens   Had flu shot   Has chronic constipation If she uses fish oil tid - it helps --needs to start back  Also recommend more fluids and fiber or stool softener Some stool back up felt on exam  Patient Active Problem List  Diagnoses  . HYPERLIPIDEMIA  . OBESITY  . ANEMIA, SECONDARY TO ACUTE BLOOD LOSS  . HYPERTENSION, ESSENTIAL NOS  . PHLEBITIS, SUPERFICIAL LEG VEINS  . HEMORRHOIDS  . ABDOMINAL WALL HERNIA  . PRESSURE ULCER HEEL  .  OSTEOARTHRITIS  . OSTEOPOROSIS  . SLEEP DISORDER  . Other screening mammogram  . DM2 (diabetes mellitus, type 2)  . Post-menopausal  . Frequent UTI  . Low back pain   Past Medical History  Diagnosis Date  . Diabetes mellitus   . Hyperlipidemia   . Osteoarthritis   . Osteoporosis   . Hypertension   . Obesity   . Upper GI bleed     AV malformation/when anticoag   Past Surgical History  Procedure Date  . Appendectomy   . Cholecystectomy   . Colon resection 1988    secondary to cancer  . Polypectomy 1998    benign X 2  . Cervicitis 1964    conization   . Total knee arthroplasty 07/2009    right   . Endometrial polyp 05/2000    hyperplasia-laser treatment   History  Substance Use Topics  . Smoking status: Former Smoker    Quit date: 08/13/2000  . Smokeless tobacco: Never Used   Comment: Quit over 10 years ago  . Alcohol Use: No   Family History  Problem Relation Age of Onset  .  Stroke Father   . Cancer Brother     lung   Allergies  Allergen Reactions  . Diclofenac Sodium     REACTION: vomiting  . Fluoxetine Hcl     REACTION: vomiting   Current Outpatient Prescriptions on File Prior to Visit  Medication Sig Dispense Refill  . Ascorbic Acid (VITAMIN C) 500 MG tablet Take 500 mg by mouth daily.        Marland Kitchen aspirin 325 MG EC tablet Take 325 mg by mouth daily.        Marland Kitchen atorvastatin (LIPITOR) 10 MG tablet Take 1 tablet (10 mg total) by mouth daily.  30 tablet  11  . calcium carbonate (OS-CAL) 600 MG TABS Take 600 mg by mouth 2 (two) times daily with a meal.        . ferrous sulfate 325 (65 FE) MG tablet Take 1 tablet (325 mg total) by mouth daily with breakfast.  30 tablet  11  . glucose blood test strip (Freestyle) Use as instructed  100 each  1  . imipramine (TOFRANIL) 50 MG tablet Take two tablets at bedtime  60 tablet  11  . Lancets Misc. MISC (Freestyle) Use as directed  100 each  3  . lisinopril (PRINIVIL,ZESTRIL) 10 MG tablet Take 1 tablet (10 mg total) by  mouth daily.  30 tablet  11  . methocarbamol (ROBAXIN) 500 MG tablet Take 1 tablet (500 mg total) by mouth every 6 (six) hours.  60 tablet  0  . Multiple Vitamin (MULTIVITAMIN) capsule Take 1 capsule by mouth daily.        . Omega-3 Fatty Acids 300 MG CAPS Take 1 capsule by mouth 3 (three) times daily.        Marland Kitchen omeprazole (PRILOSEC) 20 MG capsule Take 1 capsule (20 mg total) by mouth daily.  30 capsule  11       Review of Systems Review of Systems  Constitutional: Negative for fever, appetite change, fatigue and unexpected weight change.  Eyes: Negative for pain and visual disturbance.  Respiratory: Negative for cough and shortness of breath.   Cardiovascular: Negative for cp or palpitations    Gastrointestinal: Negative for nausea, diarrhea and pos for constipation (no blood or abd pain) Genitourinary: Negative for urgency and frequency. no excessive thirst  Skin: Negative for pallor or rash   Neurological: Negative for weakness, light-headedness, numbness and headaches.  Hematological: Negative for adenopathy. Does not bruise/bleed easily.  Psychiatric/Behavioral: Negative for dysphoric mood. The patient is not nervous/anxious.          Objective:   Physical Exam  Constitutional: She appears well-developed and well-nourished. No distress.       overwt and well appearing   HENT:  Head: Normocephalic and atraumatic.  Mouth/Throat: Oropharynx is clear and moist.  Eyes: Conjunctivae and EOM are normal. Pupils are equal, round, and reactive to light. No scleral icterus.  Neck: Normal range of motion. Neck supple. No JVD present. Carotid bruit is not present. No thyromegaly present.  Cardiovascular: Normal rate, regular rhythm, normal heart sounds and intact distal pulses.  Exam reveals no gallop.   Pulmonary/Chest: Effort normal and breath sounds normal. No respiratory distress. She has no wheezes. She exhibits no tenderness.       No crackles   Abdominal: Soft. Bowel sounds are  normal. She exhibits no distension. There is no tenderness.       Some fullness from stool in LLQ - non tender  Musculoskeletal: Normal range of motion.  She exhibits tenderness. She exhibits no edema.       R knee tender LS slt tender Labored gait- uses cane  Lymphadenopathy:    She has no cervical adenopathy.  Neurological: She is alert. She has normal reflexes. No cranial nerve deficit. Coordination normal.  Skin: Skin is warm and dry. No rash noted. No erythema. No pallor.  Psychiatric: She has a normal mood and affect.          Assessment & Plan:

## 2011-04-02 NOTE — Assessment & Plan Note (Signed)
slt better Lab Results  Component Value Date   HGBA1C 6.8* 03/29/2011   Diet controlled On ace Nl foot exam utd opthy  Will continue to follow emph imp of wt loss and urged to be as active as she can

## 2011-04-02 NOTE — Assessment & Plan Note (Signed)
Ongoing but worse lately- and interfering with walking/ activity level Has had knee problems that aff gait  Will f/u with Dr Lorelei Pont after the holidays for exam/ eval

## 2011-04-02 NOTE — Assessment & Plan Note (Signed)
With lipitor and diet - good control currently Disc goals for lipids and reasons to control them Rev labs with pt Rev low sat fat diet in detail

## 2011-04-02 NOTE — Patient Instructions (Signed)
Make appt with Dr Lorelei Pont after the holidays to talk about your back  Use fish oil/ stool softeners for constipation  Continue iron daily  Cr - kidney number is a little high so push the fluids  Follow up in 3 month  Rest of labs ok  Keep working on healthy diet and stay as active as you can  If you are interested in a shingles/zoster vaccine - call your insurance to check on coverage,( you should not get it within 1 month of other vaccines) , then call us for a prescription  for it to take to a pharmacy that gives the shot

## 2011-04-02 NOTE — Assessment & Plan Note (Signed)
This is a bit worse again- unsure if iron abs problem Will continue to follow -asymptomatic Hb 11.8 Will continue iron  Also stool softener and fish oil for constipation Re check at 3 mo f/u

## 2011-04-02 NOTE — Assessment & Plan Note (Signed)
bp in fair control at this time  No changes needed  Disc lifstyle change with low sodium diet and exercise   

## 2011-04-26 ENCOUNTER — Ambulatory Visit (INDEPENDENT_AMBULATORY_CARE_PROVIDER_SITE_OTHER): Payer: Medicare Other | Admitting: Family Medicine

## 2011-04-26 ENCOUNTER — Ambulatory Visit (INDEPENDENT_AMBULATORY_CARE_PROVIDER_SITE_OTHER)
Admission: RE | Admit: 2011-04-26 | Discharge: 2011-04-26 | Disposition: A | Discharge status: Home / Self Care | Payer: Medicare Other | Source: Ambulatory Visit | Attending: Family Medicine | Admitting: Family Medicine

## 2011-04-26 ENCOUNTER — Encounter: Payer: Self-pay | Admitting: Family Medicine

## 2011-04-26 DIAGNOSIS — M549 Dorsalgia, unspecified: Secondary | ICD-10-CM

## 2011-04-26 DIAGNOSIS — M47817 Spondylosis without myelopathy or radiculopathy, lumbosacral region: Secondary | ICD-10-CM | POA: Diagnosis not present

## 2011-04-26 DIAGNOSIS — K6389 Other specified diseases of intestine: Secondary | ICD-10-CM | POA: Diagnosis not present

## 2011-04-26 DIAGNOSIS — R29818 Other symptoms and signs involving the nervous system: Secondary | ICD-10-CM

## 2011-04-26 DIAGNOSIS — M5137 Other intervertebral disc degeneration, lumbosacral region: Secondary | ICD-10-CM | POA: Diagnosis not present

## 2011-04-26 MED ORDER — TRAMADOL HCL 50 MG PO TABS: 50.0000 mg | ORAL_TABLET | Freq: Four times a day (QID) | ORAL | Status: AC | PRN

## 2011-04-26 NOTE — Progress Notes (Signed)
  Patient Name: Erin [name] Date of Birth: 1928/09/03 Age: 78 y.o. Medical Record Number: [id number] Gender: female Date of Encounter: 04/26/2011  History of Present Illness:  Erin [name] is a 78 y.o. very pleasant female patient who presents with the following:  Having quite a bit of pain in her back. Also some pain in the midback - taking some tylenol. Muscle relaxants help a little, but none help with movement. No bowel or bladder incontinence. No significant history of back pain distantly. No history of trauma. No numbness, tingling or weakness. The patient is having some difficulty walking, and she is using a cane. She has had some few falls relatively recently within the last 6 months or so. She's not had any significant groin pain or hip pain. The significant buttocks pain. From early localized in the lumbar spine.   Past Medical History, Surgical History, Social History, Family History, Problem List, Medications, and Allergies have been reviewed and updated if relevant.  Review of Systems:  GEN: No fevers, chills. Nontoxic. Primarily MSK c/o today. MSK: Detailed in the HPI GI: tolerating PO intake without difficulty Neuro: No numbness, parasthesias, or tingling associated. Otherwise the pertinent positives of the ROS are noted above.    Physical Examination: Filed Vitals:   04/26/11 1141  BP: 130/70  Pulse: 92  Temp: 98.4 F (36.9 C)  TempSrc: Oral  Height: 5' 10.5" (1.791 m)  Weight: 238 lb 12.8 oz (108.319 kg)  SpO2: 98%    Body mass index is 33.78 kg/(m^2).   GEN: Well-developed,well-nourished,in no acute distress; alert,appropriate and cooperative throughout examination HEENT: Normocephalic and atraumatic without obvious abnormalities. Ears, externally no deformities PULM: Breathing comfortably in no respiratory distress EXT: No clubbing, cyanosis, or edema PSYCH: Normally interactive. Cooperative during the interview. Pleasant. Friendly and conversant. Not  anxious or depressed appearing. Normal, full affect.  Range of motion at  the waist: relatively normal with some mild limitation in flexion and extension No echymosis or edema Rises to examination table with no difficulty Gait: non antalgic  Inspection/Deformity: N Paraspinus Tenderness: mild tenderness around L5-S1  B Ankle Dorsiflexion (L5,4): 5/5 B Great Toe Dorsiflexion (L5,4): 5/5 Heel Walk (L5): WNL Toe Walk (S1): WNL Rise/Squat (L4): WNL  SENSORY B Medial Foot (L4): WNL B Dorsum (L5): WNL B Lateral (S1): WNL Light Touch: WNL Pinprick: WNL  REFLEXES Knee (L4): 2+ Ankle (S1): 2+  B SLR, seated: neg B SLR, supine: neg B FABER: neg B Reverse FABER: neg B Greater Troch: NT B Log Roll: neg B Stork: NT B Sciatic Notch: NT   Assessment and Plan: 1. Back pain  Ambulatory referral to Physical Therapy, DG Lumbar Spine Complete  2. Balance disorder  Ambulatory referral to Physical Therapy    Non-concerning back pain without radiculopathy. Musculoskeletal in origin. Formal physical therapy. Weight loss. Tramadol p.r.n. Continue with Tylenol and occasional muscle relaxer. Obtain plain films.

## 2011-05-03 DIAGNOSIS — M549 Dorsalgia, unspecified: Secondary | ICD-10-CM | POA: Diagnosis not present

## 2011-05-03 DIAGNOSIS — R29818 Other symptoms and signs involving the nervous system: Secondary | ICD-10-CM | POA: Diagnosis not present

## 2011-05-06 DIAGNOSIS — R29818 Other symptoms and signs involving the nervous system: Secondary | ICD-10-CM | POA: Diagnosis not present

## 2011-05-06 DIAGNOSIS — M549 Dorsalgia, unspecified: Secondary | ICD-10-CM | POA: Diagnosis not present

## 2011-05-11 DIAGNOSIS — R29818 Other symptoms and signs involving the nervous system: Secondary | ICD-10-CM | POA: Diagnosis not present

## 2011-05-11 DIAGNOSIS — M549 Dorsalgia, unspecified: Secondary | ICD-10-CM | POA: Diagnosis not present

## 2011-05-13 DIAGNOSIS — R29818 Other symptoms and signs involving the nervous system: Secondary | ICD-10-CM | POA: Diagnosis not present

## 2011-05-13 DIAGNOSIS — M549 Dorsalgia, unspecified: Secondary | ICD-10-CM | POA: Diagnosis not present

## 2011-05-18 DIAGNOSIS — M549 Dorsalgia, unspecified: Secondary | ICD-10-CM | POA: Diagnosis not present

## 2011-05-18 DIAGNOSIS — R29818 Other symptoms and signs involving the nervous system: Secondary | ICD-10-CM | POA: Diagnosis not present

## 2011-05-20 DIAGNOSIS — R29818 Other symptoms and signs involving the nervous system: Secondary | ICD-10-CM | POA: Diagnosis not present

## 2011-05-20 DIAGNOSIS — M549 Dorsalgia, unspecified: Secondary | ICD-10-CM | POA: Diagnosis not present

## 2011-05-25 DIAGNOSIS — M549 Dorsalgia, unspecified: Secondary | ICD-10-CM | POA: Diagnosis not present

## 2011-05-25 DIAGNOSIS — R29818 Other symptoms and signs involving the nervous system: Secondary | ICD-10-CM | POA: Diagnosis not present

## 2011-05-27 DIAGNOSIS — R29818 Other symptoms and signs involving the nervous system: Secondary | ICD-10-CM | POA: Diagnosis not present

## 2011-05-27 DIAGNOSIS — M549 Dorsalgia, unspecified: Secondary | ICD-10-CM | POA: Diagnosis not present

## 2011-06-01 DIAGNOSIS — M549 Dorsalgia, unspecified: Secondary | ICD-10-CM | POA: Diagnosis not present

## 2011-06-01 DIAGNOSIS — R29818 Other symptoms and signs involving the nervous system: Secondary | ICD-10-CM | POA: Diagnosis not present

## 2011-07-02 ENCOUNTER — Ambulatory Visit: Payer: Medicare Other | Admitting: Family Medicine

## 2011-07-05 ENCOUNTER — Ambulatory Visit (INDEPENDENT_AMBULATORY_CARE_PROVIDER_SITE_OTHER): Payer: Medicare Other | Admitting: Family Medicine

## 2011-07-05 ENCOUNTER — Encounter: Payer: Self-pay | Admitting: Family Medicine

## 2011-07-05 VITALS — BP 140/60 | HR 68 | Temp 97.8°F | Ht 71.0 in | Wt 237.0 lb

## 2011-07-05 DIAGNOSIS — N289 Disorder of kidney and ureter, unspecified: Secondary | ICD-10-CM | POA: Diagnosis not present

## 2011-07-05 DIAGNOSIS — E119 Type 2 diabetes mellitus without complications: Secondary | ICD-10-CM

## 2011-07-05 DIAGNOSIS — D62 Acute posthemorrhagic anemia: Secondary | ICD-10-CM | POA: Diagnosis not present

## 2011-07-05 DIAGNOSIS — E669 Obesity, unspecified: Secondary | ICD-10-CM | POA: Diagnosis not present

## 2011-07-05 DIAGNOSIS — I10 Essential (primary) hypertension: Secondary | ICD-10-CM | POA: Diagnosis not present

## 2011-07-05 NOTE — Assessment & Plan Note (Signed)
Hoping for imp with chronic utis now being tx Pt will call back with name of med Enc good water intake

## 2011-07-05 NOTE — Assessment & Plan Note (Signed)
bp in fair control at this time  No changes needed  Disc lifstyle change with low sodium diet and exercise   

## 2011-07-05 NOTE — Assessment & Plan Note (Signed)
Had been stable - some concern about iron abs in past  Will continue iron Lab today

## 2011-07-05 NOTE — Assessment & Plan Note (Signed)
Pt states she has lost a few lb Diet good- needs exercise which is limited due to OA and back pain May try water exercise

## 2011-07-05 NOTE — Progress Notes (Signed)
Subjective:    Patient ID: Erin [name], female    DOB: 1928-08-04, 78 y.o.   MRN: [id number]  HPI Here for f/u of chronic conditions   Takes 1 pill daily for uti prophylaxis -- and working well  Doing much better  Seeing Dr Matilde Sprang for that   Is feeling good  Chronic back pain -- had some PT and it helped -went for a month   Diabetes Home sugar results -went up a bit in [place] and then back down  Generally runs 120s or below  DM diet - is doing very well with that  Exercise - not a lot due to back pain , has OA , thinking about water program  Symptoms A1C last 6.8  No problems with medications - this is diet controlled  Renal protection-on ace  Last eye exam - approx ?     bp is 140/60     Today No cp or palpitations or headaches or edema  No side effects to medicines    Lipids last controlled with lipitor and diet Still on the lipitor   Lab Results  Component Value Date   CHOL 144 03/29/2011   HDL 48.70 03/29/2011   LDLCALC 62 03/29/2011   LDLDIRECT 165.7 09/23/2010   TRIG 165.0* 03/29/2011   CHOLHDL 3 03/29/2011    Improved in dec   Cr last 1.3- watching this  Is drinking lots of water  Now not having utis    Zoster status - will look into coverage of the vaccine  Obese- stable wt bmi is 37 Exercise is limited due to her arthritis  Patient Active Problem List  Diagnoses  . HYPERLIPIDEMIA  . OBESITY  . ANEMIA, SECONDARY TO ACUTE BLOOD LOSS  . HYPERTENSION, ESSENTIAL NOS  . PHLEBITIS, SUPERFICIAL LEG VEINS  . HEMORRHOIDS  . ABDOMINAL WALL HERNIA  . PRESSURE ULCER HEEL  . OSTEOARTHRITIS  . OSTEOPOROSIS  . SLEEP DISORDER  . Other screening mammogram  . DM2 (diabetes mellitus, type 2)  . Post-menopausal  . Frequent UTI  . Low back pain  . Renal insufficiency   Past Medical History  Diagnosis Date  . Diabetes mellitus   . Hyperlipidemia   . Osteoarthritis   . Osteoporosis   . Hypertension   . Obesity   . Upper GI bleed     AV  malformation/when anticoag   Past Surgical History  Procedure Date  . Appendectomy   . Cholecystectomy   . Colon resection 1988    secondary to cancer  . Polypectomy 1998    benign X 2  . Cervicitis 1964    conization   . Total knee arthroplasty 07/2009    right   . Endometrial polyp 05/2000    hyperplasia-laser treatment   History  Substance Use Topics  . Smoking status: Former Smoker    Quit date: 08/13/2000  . Smokeless tobacco: Never Used   Comment: Quit over 10 years ago  . Alcohol Use: No   Family History  Problem Relation Age of Onset  . Stroke Father   . Cancer Brother     lung   Allergies  Allergen Reactions  . Diclofenac Sodium     REACTION: vomiting  . Fluoxetine Hcl     REACTION: vomiting   Current Outpatient Prescriptions on File Prior to Visit  Medication Sig Dispense Refill  . Ascorbic Acid (VITAMIN C) 500 MG tablet Take 500 mg by mouth daily.        Marland Kitchen aspirin  325 MG EC tablet Take 325 mg by mouth daily.        Marland Kitchen atorvastatin (LIPITOR) 10 MG tablet Take 1 tablet (10 mg total) by mouth daily.  30 tablet  11  . calcium carbonate (OS-CAL) 600 MG TABS Take 600 mg by mouth 2 (two) times daily with a meal.        . ferrous sulfate 325 (65 FE) MG tablet Take 1 tablet (325 mg total) by mouth daily with breakfast.  30 tablet  11  . glucose blood test strip (Freestyle) Use as instructed  100 each  1  . imipramine (TOFRANIL) 50 MG tablet Take two tablets at bedtime  60 tablet  11  . Lancets Misc. MISC (Freestyle) Use as directed  100 each  3  . lisinopril (PRINIVIL,ZESTRIL) 10 MG tablet Take 1 tablet (10 mg total) by mouth daily.  30 tablet  11  . methocarbamol (ROBAXIN) 500 MG tablet Take 1 tablet (500 mg total) by mouth every 6 (six) hours.  60 tablet  0  . Multiple Vitamin (MULTIVITAMIN) capsule Take 1 capsule by mouth daily.        . Omega-3 Fatty Acids 300 MG CAPS Take 1 capsule by mouth 3 (three) times daily.        Marland Kitchen omeprazole (PRILOSEC) 20 MG capsule  Take 1 capsule (20 mg total) by mouth daily.  30 capsule  11     Review of Systems Review of Systems  Constitutional: Negative for fever, appetite change, fatigue and unexpected weight change.  Eyes: Negative for pain and visual disturbance.  Respiratory: Negative for cough and shortness of breath.   Cardiovascular: Negative for cp or palpitations    Gastrointestinal: Negative for nausea, diarrhea and constipation.  Genitourinary: Negative for urgency and frequency. no excessive thirst  Skin: Negative for pallor or rash   MSK pos for arthritis stiffness and pain  Neurological: Negative for weakness, light-headedness, numbness and headaches.  Hematological: Negative for adenopathy. Does not bruise/bleed easily.  Psychiatric/Behavioral: Negative for dysphoric mood. The patient is not nervous/anxious.          Objective:   Physical Exam  Constitutional: She appears well-developed and well-nourished. No distress.  HENT:  Head: Normocephalic and atraumatic.  Mouth/Throat: Oropharynx is clear and moist.  Eyes: Conjunctivae and EOM are normal. Pupils are equal, round, and reactive to light. No scleral icterus.  Neck: Normal range of motion. Neck supple. No JVD present. Carotid bruit is not present. No thyromegaly present.  Cardiovascular: Normal rate, regular rhythm, normal heart sounds and intact distal pulses.  Exam reveals no gallop.   Pulmonary/Chest: Effort normal and breath sounds normal. No respiratory distress. She has no wheezes.  Abdominal: Soft. Bowel sounds are normal. She exhibits no distension, no abdominal bruit and no mass. There is no tenderness.  Musculoskeletal: She exhibits no edema.  Lymphadenopathy:    She has no cervical adenopathy.  Neurological: She is alert. She has normal reflexes. No cranial nerve deficit. She exhibits normal muscle tone. Coordination normal.  Skin: Skin is warm and dry. No rash noted. No erythema. No pallor.  Psychiatric: She has a normal  mood and affect.          Assessment & Plan:

## 2011-07-05 NOTE — Patient Instructions (Addendum)
Please send for last eye exam - from [facility]  If you are interested in shingles vaccine in future - call your insurance company to see how coverage is and call us to schedule  Lab today  Keep drinking your water  No change in medicines  Follow up in about 6 months  Think about water exercise  Stay as active as you can

## 2011-07-05 NOTE — Assessment & Plan Note (Signed)
Home sugars very good Diet controlled On ace and statin Disc plan for water exercise if able Need shingles vaccine if can afford -will call ins

## 2011-07-06 LAB — COMPREHENSIVE METABOLIC PANEL
AST: 21 U/L (ref 0–37)
Albumin: 3.6 g/dL (ref 3.5–5.2)
Alkaline Phosphatase: 72 U/L (ref 39–117)
BUN: 16 mg/dL (ref 6–23)
Calcium: 9.2 mg/dL (ref 8.4–10.5)
Chloride: 104 mEq/L (ref 96–112)
Glucose, Bld: 121 mg/dL — ABNORMAL HIGH (ref 70–99)
Potassium: 4.1 mEq/L (ref 3.5–5.1)
Sodium: 136 mEq/L (ref 135–145)
Total Protein: 7.2 g/dL (ref 6.0–8.3)

## 2011-07-06 LAB — CBC WITH DIFFERENTIAL/PLATELET
Eosinophils Absolute: 0 10*3/uL (ref 0.0–0.7)
Lymphocytes Relative: 28.1 % (ref 12.0–46.0)
MCHC: 32.6 g/dL (ref 30.0–36.0)
MCV: 86.3 fl (ref 78.0–100.0)
Monocytes Absolute: 0.5 10*3/uL (ref 0.1–1.0)
Neutrophils Relative %: 64.8 % (ref 43.0–77.0)
Platelets: 174 10*3/uL (ref 150.0–400.0)
WBC: 7.2 10*3/uL (ref 4.5–10.5)

## 2011-07-28 DIAGNOSIS — N3020 Other chronic cystitis without hematuria: Secondary | ICD-10-CM | POA: Diagnosis not present

## 2011-07-28 DIAGNOSIS — N3941 Urge incontinence: Secondary | ICD-10-CM | POA: Diagnosis not present

## 2011-08-30 ENCOUNTER — Telehealth: Payer: Self-pay | Admitting: *Deleted

## 2011-08-30 MED ORDER — GLUCOSE BLOOD VI STRP: ORAL_STRIP | Status: DC

## 2011-08-31 DIAGNOSIS — M79609 Pain in unspecified limb: Secondary | ICD-10-CM | POA: Diagnosis not present

## 2011-08-31 DIAGNOSIS — B351 Tinea unguium: Secondary | ICD-10-CM | POA: Diagnosis not present

## 2011-10-05 DIAGNOSIS — I10 Essential (primary) hypertension: Secondary | ICD-10-CM | POA: Diagnosis not present

## 2011-10-05 DIAGNOSIS — H35039 Hypertensive retinopathy, unspecified eye: Secondary | ICD-10-CM | POA: Diagnosis not present

## 2011-10-07 ENCOUNTER — Other Ambulatory Visit: Payer: Self-pay | Admitting: Family Medicine

## 2011-10-07 NOTE — Telephone Encounter (Signed)
[facility] request refill omeprazolel,lisinopril and atorvastatin # 30 x 11 on each.

## 2011-10-22 ENCOUNTER — Telehealth: Payer: Self-pay | Admitting: *Deleted

## 2011-10-22 NOTE — Telephone Encounter (Signed)
Done and in IN box 

## 2011-10-22 NOTE — Telephone Encounter (Signed)
Paperwork for diabetic testing supplies from [facility] in your IN box.

## 2011-10-22 NOTE — Telephone Encounter (Signed)
Faxed to [facility] at [phone] on  10/22/11 at 4:00pm

## 2011-11-17 ENCOUNTER — Other Ambulatory Visit: Payer: Self-pay | Admitting: Family Medicine

## 2011-11-23 ENCOUNTER — Other Ambulatory Visit: Payer: Self-pay | Admitting: Family Medicine

## 2011-11-23 DIAGNOSIS — Z1231 Encounter for screening mammogram for malignant neoplasm of breast: Secondary | ICD-10-CM

## 2011-12-08 ENCOUNTER — Other Ambulatory Visit: Payer: Self-pay | Admitting: Family Medicine

## 2011-12-10 ENCOUNTER — Ambulatory Visit
Admission: RE | Admit: 2011-12-10 | Discharge: 2011-12-10 | Disposition: A | Discharge status: Home / Self Care | Payer: Medicare Other | Source: Ambulatory Visit | Attending: Family Medicine | Admitting: Family Medicine

## 2011-12-10 DIAGNOSIS — Z1231 Encounter for screening mammogram for malignant neoplasm of breast: Secondary | ICD-10-CM

## 2011-12-10 LAB — HM MAMMOGRAPHY: HM Mammogram: NORMAL

## 2011-12-14 ENCOUNTER — Encounter: Payer: Self-pay | Admitting: Family Medicine

## 2011-12-14 DIAGNOSIS — R300 Dysuria: Secondary | ICD-10-CM | POA: Diagnosis not present

## 2011-12-14 DIAGNOSIS — N3020 Other chronic cystitis without hematuria: Secondary | ICD-10-CM | POA: Diagnosis not present

## 2011-12-30 DIAGNOSIS — R300 Dysuria: Secondary | ICD-10-CM | POA: Diagnosis not present

## 2012-01-05 ENCOUNTER — Ambulatory Visit: Payer: Medicare Other | Admitting: Family Medicine

## 2012-01-07 ENCOUNTER — Ambulatory Visit (INDEPENDENT_AMBULATORY_CARE_PROVIDER_SITE_OTHER): Payer: Medicare Other | Admitting: Family Medicine

## 2012-01-07 ENCOUNTER — Encounter: Payer: Self-pay | Admitting: Family Medicine

## 2012-01-07 VITALS — BP 138/72 | HR 104 | Temp 98.4°F | Ht 70.5 in | Wt 235.8 lb

## 2012-01-07 DIAGNOSIS — E785 Hyperlipidemia, unspecified: Secondary | ICD-10-CM

## 2012-01-07 DIAGNOSIS — I10 Essential (primary) hypertension: Secondary | ICD-10-CM

## 2012-01-07 DIAGNOSIS — Z23 Encounter for immunization: Secondary | ICD-10-CM | POA: Diagnosis not present

## 2012-01-07 DIAGNOSIS — E119 Type 2 diabetes mellitus without complications: Secondary | ICD-10-CM | POA: Diagnosis not present

## 2012-01-07 LAB — CBC WITH DIFFERENTIAL/PLATELET
Basophils Relative: 0.4 % (ref 0.0–3.0)
Eosinophils Relative: 0.1 % (ref 0.0–5.0)
HCT: 35.8 % — ABNORMAL LOW (ref 36.0–46.0)
Hemoglobin: 11.3 g/dL — ABNORMAL LOW (ref 12.0–15.0)
Lymphocytes Relative: 29.1 % (ref 12.0–46.0)
MCHC: 31.6 g/dL (ref 30.0–36.0)
Monocytes Absolute: 0.5 10*3/uL (ref 0.1–1.0)
Monocytes Relative: 6.4 % (ref 3.0–12.0)
RDW: 15.1 % — ABNORMAL HIGH (ref 11.5–14.6)

## 2012-01-07 LAB — COMPREHENSIVE METABOLIC PANEL
ALT: 17 U/L (ref 0–35)
Alkaline Phosphatase: 64 U/L (ref 39–117)
CO2: 26 mEq/L (ref 19–32)
Creatinine, Ser: 1.2 mg/dL (ref 0.4–1.2)
GFR: 54.34 mL/min — ABNORMAL LOW (ref >60.00)
Total Bilirubin: 0.7 mg/dL (ref 0.3–1.2)

## 2012-01-07 LAB — LIPID PANEL
Cholesterol: 143 mg/dL (ref 0–200)
LDL Cholesterol: 71 mg/dL (ref 0–99)
Triglycerides: 145 mg/dL (ref 0.0–149.0)

## 2012-01-07 NOTE — Progress Notes (Signed)
Subjective:    Patient ID: Erin [name], female    DOB: [birth year], 78 y.o.   MRN: [id number]  HPI Here for f/u of chronic conditions  Lost a good friend yesterday- Rod Holler Is doing ok overall  She herself is feeling ok overall   She still takes care of her son   Is on proph abx for frequent utis  She is on nitrofurantoin daily  occ acid in diet affects her   Wt is down 2 lb with bmi of 33 Is trying to loose   bp is stable today  No cp or palpitations or headaches or edema  No side effects to medicines  BP Readings from Last 3 Encounters:  01/07/12 138/72  07/05/11 140/60  04/26/11 130/70     Diabetes Home sugar results - 140s in am and goes down in the afternoon DM diet - very good  Exercise -- sporatic at best  Symptoms A1C last  Lab Results  Component Value Date   HGBA1C 7.1* 07/05/2011    No problems with medications  Renal protection on ace Also on statin  Last eye exam -- was within a year   Lab Results  Component Value Date   CHOL 144 03/29/2011   HDL 48.70 03/29/2011   LDLCALC 62 03/29/2011   LDLDIRECT 165.7 09/23/2010   TRIG 165.0* 03/29/2011   CHOLHDL 3 03/29/2011    Diet is low sat fat      Chemistry      Component Value Date/Time   NA 136 07/05/2011 1606   K 4.1 07/05/2011 1606   CL 104 07/05/2011 1606   CO2 24 07/05/2011 1606   BUN 16 07/05/2011 1606   CREATININE 1.1 07/05/2011 1606      Component Value Date/Time   CALCIUM 9.2 07/05/2011 1606   ALKPHOS 72 07/05/2011 1606   AST 21 07/05/2011 1606   ALT 14 07/05/2011 1606   BILITOT 0.3 07/05/2011 1606         Patient Active Problem List  Diagnosis  . HYPERLIPIDEMIA  . OBESITY  . ANEMIA, SECONDARY TO ACUTE BLOOD LOSS  . HYPERTENSION, ESSENTIAL NOS  . PHLEBITIS, SUPERFICIAL LEG VEINS  . HEMORRHOIDS  . ABDOMINAL WALL HERNIA  . PRESSURE ULCER HEEL  . OSTEOARTHRITIS  . OSTEOPOROSIS  . SLEEP DISORDER  . Other screening mammogram  . DM2 (diabetes mellitus, type 2)  . Post-menopausal  .  Frequent UTI  . Low back pain  . Renal insufficiency   Past Medical History  Diagnosis Date  . Diabetes mellitus   . Hyperlipidemia   . Osteoarthritis   . Osteoporosis   . Hypertension   . Obesity   . Upper GI bleed     AV malformation/when anticoag   Past Surgical History  Procedure Date  . Appendectomy   . Cholecystectomy   . Colon resection 1988    secondary to cancer  . Polypectomy 1998    benign X 2  . Cervicitis 1964    conization   . Total knee arthroplasty 07/2009    right   . Endometrial polyp 05/2000    hyperplasia-laser treatment   History  Substance Use Topics  . Smoking status: Former Smoker    Quit date: 08/13/2000  . Smokeless tobacco: Never Used   Comment: Quit over 10 years ago  . Alcohol Use: No   Family History  Problem Relation Age of Onset  . Stroke Father   . Cancer Brother     lung  Allergies  Allergen Reactions  . Diclofenac Sodium     REACTION: vomiting  . Fluoxetine Hcl     REACTION: vomiting   Current Outpatient Prescriptions on File Prior to Visit  Medication Sig Dispense Refill  . Ascorbic Acid (VITAMIN C) 500 MG tablet Take 500 mg by mouth daily.        Marland Kitchen aspirin 325 MG EC tablet Take 325 mg by mouth daily.        Marland Kitchen atorvastatin (LIPITOR) 10 MG tablet TAKE ONE (1) TABLET BY MOUTH EVERY      DAY  30 tablet  11  . calcium carbonate (OS-CAL) 600 MG TABS Take 600 mg by mouth 2 (two) times daily with a meal.        . ferrous sulfate 325 (65 FE) MG tablet TAKE ONE TABLET BY MOUTH EVERY MORNING  WITH BREAKFAST  30 tablet  6  . glucose blood test strip (Freestyle) Use as instructed  100 each  3  . imipramine (TOFRANIL) 50 MG tablet Take two tablets at bedtime  60 tablet  11  . Lancets (FREESTYLE) lancets CHECK BLOOD SUGAR ONCE DAILY AS DIRECTEDAND AS NEEDED  100 each  1  . Lancets Misc. MISC (Freestyle) Use as directed  100 each  3  . lisinopril (PRINIVIL,ZESTRIL) 10 MG tablet TAKE ONE (1) TABLET BY MOUTH EVERY      DAY  30 tablet   11  . methocarbamol (ROBAXIN) 500 MG tablet Take 1 tablet (500 mg total) by mouth every 6 (six) hours.  60 tablet  0  . Multiple Vitamin (MULTIVITAMIN) capsule Take 1 capsule by mouth daily.        . Omega-3 Fatty Acids 300 MG CAPS Take 1 capsule by mouth 3 (three) times daily.        Marland Kitchen omeprazole (PRILOSEC) 20 MG capsule TAKE ONE CAPSULE BY MOUTH DAILY  30 capsule  11     Review of Systems Review of Systems  Constitutional: Negative for fever, appetite change, fatigue and unexpected weight change.  Eyes: Negative for pain and visual disturbance.  Respiratory: Negative for cough and shortness of breath.   Cardiovascular: Negative for cp or palpitations    Gastrointestinal: Negative for nausea, diarrhea and constipation.  Genitourinary: Negative for urgency and frequency.  Skin: Negative for pallor or rash   Neurological: Negative for weakness, light-headedness, numbness and headaches.  Hematological: Negative for adenopathy. Does not bruise/bleed easily.  Psychiatric/Behavioral: Negative for dysphoric mood. The patient is not nervous/anxious.  is experiencing grief currently       Objective:   Physical Exam  Constitutional: She appears well-developed and well-nourished. No distress.  HENT:  Head: Normocephalic and atraumatic.  Mouth/Throat: Oropharynx is clear and moist.  Eyes: Conjunctivae normal and EOM are normal. Pupils are equal, round, and reactive to light. No scleral icterus.  Neck: Normal range of motion. Neck supple. No JVD present. Carotid bruit is not present. No thyromegaly present.  Cardiovascular: Normal rate, regular rhythm, normal heart sounds and intact distal pulses.  Exam reveals no gallop.   Pulmonary/Chest: Effort normal and breath sounds normal. No respiratory distress. She has no wheezes.  Abdominal: Soft. Bowel sounds are normal. She exhibits no distension, no abdominal bruit and no mass. There is no tenderness.  Musculoskeletal: She exhibits no edema.    Lymphadenopathy:    She has no cervical adenopathy.  Neurological: She is alert. She has normal reflexes. No cranial nerve deficit. She exhibits normal muscle tone. Coordination normal.  Skin: Skin is warm and dry. No rash noted. No erythema. No pallor.  Psychiatric: She has a normal mood and affect.          Assessment & Plan:

## 2012-01-07 NOTE — Assessment & Plan Note (Signed)
bp is stable today  No cp or palpitations or headaches or edema  No side effects to medicines  BP Readings from Last 3 Encounters:  01/07/12 138/72  07/05/11 140/60  04/26/11 130/70    Lab today

## 2012-01-07 NOTE — Patient Instructions (Addendum)
Flu shot today  Labs today Keep taking care of yourself ! -- stay active and eat a healthy diet    Follow up in 6 months for annual exam with lab prior

## 2012-01-07 NOTE — Assessment & Plan Note (Signed)
Lab Results  Component Value Date   HGBA1C 7.1* 07/05/2011    Lab today No meds Better diet and loosing wt On ace and statin

## 2012-01-07 NOTE — Assessment & Plan Note (Signed)
Lab today on statin and diet  Rev low sat fat diet

## 2012-01-22 ENCOUNTER — Other Ambulatory Visit: Payer: Self-pay | Admitting: Family Medicine

## 2012-01-24 ENCOUNTER — Telehealth: Payer: Self-pay | Admitting: Family Medicine

## 2012-01-24 MED ORDER — IMIPRAMINE HCL 50 MG PO TABS: ORAL_TABLET | ORAL | Status: DC

## 2012-01-24 NOTE — Telephone Encounter (Signed)
Please give her refills for a year-thanks

## 2012-01-24 NOTE — Telephone Encounter (Signed)
Needs refil

## 2012-01-24 NOTE — Telephone Encounter (Signed)
Ok to refill 

## 2012-02-09 DIAGNOSIS — M79609 Pain in unspecified limb: Secondary | ICD-10-CM | POA: Diagnosis not present

## 2012-02-09 DIAGNOSIS — B351 Tinea unguium: Secondary | ICD-10-CM | POA: Diagnosis not present

## 2012-03-30 ENCOUNTER — Ambulatory Visit (INDEPENDENT_AMBULATORY_CARE_PROVIDER_SITE_OTHER): Payer: Medicare Other | Admitting: Family Medicine

## 2012-03-30 ENCOUNTER — Telehealth: Payer: Self-pay | Admitting: Family Medicine

## 2012-03-30 ENCOUNTER — Encounter: Payer: Self-pay | Admitting: Family Medicine

## 2012-03-30 VITALS — BP 142/58 | HR 100 | Temp 99.0°F | Wt 234.8 lb

## 2012-03-30 DIAGNOSIS — M5430 Sciatica, unspecified side: Secondary | ICD-10-CM

## 2012-03-30 MED ORDER — HYDROCODONE-ACETAMINOPHEN 5-325 MG PO TABS: 1.0000 | ORAL_TABLET | Freq: Four times a day (QID) | ORAL | Status: DC | PRN

## 2012-03-30 NOTE — Patient Instructions (Addendum)
Try ice and heat on your back.  One may help more than the other.  Take vicodin for pain.  It can make you drowsy.  You may need a stool softener with it.  Try to stretch your back as we discussed.  Take care.

## 2012-03-30 NOTE — Telephone Encounter (Signed)
Patient Information:  Caller Name: [place]  Phone: [phone]  Patient: Erin, [name]  Gender: Female  DOB: [birth year]  Age: 77 Years  PCP: [name] [facility])  Office Follow Up:  Does the office need to follow up with this patient?: No  Instructions For The Office: N/A  RN Note:  Onset of right buttock pain radiating down the right thigh into the ankle 03/29/12.  Has tried OTC pain medications without relief.  Per protocol, advised appt within 4 hours; appt scheduled 03/30/12 1600 with Dr. Damita Dunnings.  krs/can  Symptoms  Reason For Call & Symptoms: R sided thigh pain  Reviewed Health History In EMR: Yes  Reviewed Medications In EMR: Yes  Reviewed Allergies In EMR: Yes  Reviewed Surgeries / Procedures: Yes  Date of Onset of Symptoms: 03/29/2012  Guideline(s) Used:  Leg Pain  Disposition Per Guideline:   Go to Office Now  Reason For Disposition Reached:   Severe pain (e.g., excruciating, unable to do any normal activities)  Advice Given:  N/A  Appointment Scheduled:  03/30/2012 16:00:00 Appointment Scheduled Provider:  Elsie Stain Brigitte Pulse) [facility])

## 2012-03-30 NOTE — Progress Notes (Signed)
Sx started on R leg 2 days ago.  Worse yesterday and last night.  No L leg sx.  Starts at the hip and aches down the outside of the leg, always in the same area.  She gets spasms on the leg.  No help with tramadol.  No rash.  No foot weakness, pain with rom but she can still move her foot well.  Can still weight bear.  No falls, no trauma.    Meds, vitals, and allergies reviewed.   ROS: See HPI.  Otherwise, noncontributory.  nad but uncomfortable ncat  rrr ctab Back w/o midline pain  R SI joint ttp and tender with testing R SLR pos S/S wnl in BLE No rash

## 2012-03-31 ENCOUNTER — Telehealth: Payer: Self-pay | Admitting: Family Medicine

## 2012-03-31 DIAGNOSIS — M5430 Sciatica, unspecified side: Secondary | ICD-10-CM | POA: Insufficient documentation

## 2012-03-31 NOTE — Telephone Encounter (Signed)
Patient Information:  Caller Name: [place]  Phone: [phone]  Patient: Erin [name]  Gender: Female  DOB: [birth year]  Age: 78 Years  PCP: Elsie Stain Brigitte Pulse) [facility])  Office Follow Up:  Does the office need to follow up with this patient?: No  Instructions For The Office: N/A  RN Note:  Seen in office 03/30/12 and given Rx for vicodin, but states her pain is not improved.  Can bear weight on leg.  Per back pain protocol, advised appt within 24 hours; appt scheduled 04/01/12 1030 with Dr. Charlett Blake at [facility] office.  krs/can  Symptoms  Reason For Call & Symptoms: back pain radiating down thigh  Reviewed Health History In EMR: Yes  Reviewed Medications In EMR: Yes  Reviewed Allergies In EMR: Yes  Reviewed Surgeries / Procedures: Yes  Date of Onset of Symptoms: 03/29/2012  Guideline(s) Used:  Back Pain  Disposition Per Guideline:   See Today or Tomorrow in Office  Reason For Disposition Reached:   Pain radiates into the thigh or further down the leg  Advice Given:  N/A  Appointment Scheduled:  04/01/2012 10:30:00 Appointment Scheduled Provider:  N/A

## 2012-03-31 NOTE — Assessment & Plan Note (Signed)
Would like to avoid prednisone given her history.  Use vicodin and gently try back/hip rom exercises.  She agrees. F/u prn.  No need to image today.

## 2012-04-01 ENCOUNTER — Ambulatory Visit (INDEPENDENT_AMBULATORY_CARE_PROVIDER_SITE_OTHER): Payer: Medicare Other | Admitting: Family Medicine

## 2012-04-01 ENCOUNTER — Encounter: Payer: Self-pay | Admitting: Family Medicine

## 2012-04-01 VITALS — BP 98/52 | HR 107 | Temp 97.1°F | Ht 70.5 in | Wt 234.0 lb

## 2012-04-01 DIAGNOSIS — E119 Type 2 diabetes mellitus without complications: Secondary | ICD-10-CM | POA: Diagnosis not present

## 2012-04-01 DIAGNOSIS — M533 Sacrococcygeal disorders, not elsewhere classified: Secondary | ICD-10-CM | POA: Diagnosis not present

## 2012-04-01 DIAGNOSIS — I10 Essential (primary) hypertension: Secondary | ICD-10-CM | POA: Diagnosis not present

## 2012-04-01 HISTORY — DX: Sacrococcygeal disorders, not elsewhere classified: M53.3

## 2012-04-01 MED ORDER — METHYLPREDNISOLONE 4 MG PO KIT: PACK | ORAL | Status: DC

## 2012-04-01 MED ORDER — CYCLOBENZAPRINE HCL 10 MG PO TABS: 10.0000 mg | ORAL_TABLET | Freq: Every evening | ORAL | Status: DC | PRN

## 2012-04-01 NOTE — Assessment & Plan Note (Signed)
Warned the steroids will put her sugar up some, so minimize carbs and eat more protein the next few days.

## 2012-04-01 NOTE — Telephone Encounter (Signed)
seen

## 2012-04-01 NOTE — Assessment & Plan Note (Signed)
Low. Mildly dehydrated today, encouraged one gatorade a day and increased hydration.

## 2012-04-01 NOTE — Assessment & Plan Note (Signed)
Right hip, with symptoms radiating down to right knee. Started on Medrol dosepak and given Cyclobenzaprine for qhs use. mosit heat and return if no improvement

## 2012-04-01 NOTE — Patient Instructions (Addendum)
Sacroiliac Joint Dysfunction The sacroiliac joint connects the lower part of the spine (the sacrum) with the bones of the pelvis. CAUSES  Sometimes, there is no obvious reason for sacroiliac joint dysfunction. Other times, it may occur   During pregnancy.  After injury, such as:  Car accidents.  Sport-related injuries.  Work-related injuries.  Due to one leg being shorter than the other.  Due to other conditions that affect the joints, such as:  Rheumatoid arthritis.  Gout.  Psoriasis.  Joint infection (septic arthritis). SYMPTOMS  Symptoms may include:  Pain in the:  Lower back.  Buttocks.  Groin.  Thighs and legs.  Difficult sitting, standing, walking, lying, bending or lifting. DIAGNOSIS  A number of tests may be used to help diagnose the cause of sacroiliac joint dysfunction, including:  Imaging tests to look for other causes of pain, including:  MRI.  CT scan.  Bone scan.  Diagnostic injection: During a special x-ray (called fluoroscopy), a needle is put into the sacroiliac joint. A numbing medicine is injected into the joint. If the pain is improved or stopped, the diagnosis of sacroiliac joint dysfunction is more likely. TREATMENT  There are a number of types of treatment used for sacroiliac joint dysfunction, including:  Only take over-the-counter or prescription medicines for pain, discomfort, or fever as directed by your caregiver.  Medications to relax muscles.  Rest. Decreasing activity can help cut down on painful muscle spasms and allow the back to heal.  Application of heat or ice to the lower back may improve muscle spasms and soothe pain.  Brace. A special back brace, called a sacroiliac belt, can help support the joint while your back is healing.  Physical therapy can help teach comfortable positions and exercises to strengthen muscles that support the sacroiliac joint.  Cortisone injections. Injections of steroid medicine into the  joint can help decrease swelling and improve pain.  Hyaluronic acid injections. This chemical improves lubrication within the sacroiliac joint, thereby decreasing pain.  Radiofrequency ablation. A special needle is placed into the joint, where it burns away nerves that are carrying pain messages from the joint.  Surgery. Because pain occurs during movement of the joint, screws and plates may be installed in order to limit or prevent joint motion. HOME CARE INSTRUCTIONS   Take all medications exactly as directed.  Follow instructions regarding both rest and physical activity, to avoid worsening the pain.  Do physical therapy exercises exactly as prescribed. SEEK IMMEDIATE MEDICAL CARE IF:  You experience increasingly severe pain.  You develop new symptoms, such as numbness or tingling in your legs or feet.  You lose bladder or bowel control. Document Released: 06/25/2008 Document Revised: 06/21/2011 Document Reviewed: 06/25/2008 [facility] Patient Information 2013 [place].

## 2012-04-01 NOTE — Progress Notes (Signed)
Patient ID: Erin [name], female   DOB: 1928/12/22, 78 y.o.   MRN: [id number] MATELYN MCFAIL [id number] [birth year] 04/01/2012      Progress Note-Follow Up  Subjective  Chief Complaint  Chief Complaint  Patient presents with  . Back Pain    pain radiating from right buttocks down to leg  . Neck Pain    on left side    HPI  Patient is an 78 year old female who is in today for evaluation of right hip and right lower leg pain. She was in the office earlier this week secondary to the pain and given hydrocodone/APAP got no relief. She describes the pain is in the posterior right hip worse with certain position changes. She notes when she lies on her left side hurts more than she lets on the right side. She gets a pulling sensation. She takes shortness or twists a certain way the pain will radiate down towards the toes but usually stops at the knee. The pain goes laterally and anteriorly to light. She denies any falls or trauma. She denies any GI or GU complaints including incontinence. She notes her blood sugar was little high this morning at 178 but generally she's been well-controlled in the 120s 130s. No chest pain, palpitations, shortness of breath, polyuria or other acute complaints are noted.  Past Medical History  Diagnosis Date  . Diabetes mellitus   . Hyperlipidemia   . Osteoarthritis   . Osteoporosis   . Hypertension   . Obesity   . Upper GI bleed     AV malformation/when anticoag  . Sacroiliac joint dysfunction 04/01/2012    Past Surgical History  Procedure Date  . Appendectomy   . Cholecystectomy   . Colon resection 1988    secondary to cancer  . Polypectomy 1998    benign X 2  . Cervicitis 1964    conization   . Total knee arthroplasty 07/2009    right   . Endometrial polyp 05/2000    hyperplasia-laser treatment    Family History  Problem Relation Age of Onset  . Stroke Father   . Cancer Brother     lung    History   Social History  . Marital Status:  Widowed    Spouse Name: N/A    Number of Children: N/A  . Years of Education: N/A   Occupational History  . Not on file.   Social History Main Topics  . Smoking status: Former Smoker    Quit date: 08/13/2000  . Smokeless tobacco: Never Used     Comment: Quit over 10 years ago  . Alcohol Use: No  . Drug Use: No  . Sexually Active: Not on file   Other Topics Concern  . Not on file   Social History Narrative   Lives with son who is mentally impaired    Current Outpatient Prescriptions on File Prior to Visit  Medication Sig Dispense Refill  . Ascorbic Acid (VITAMIN C) 500 MG tablet Take 500 mg by mouth daily.        Marland Kitchen aspirin 325 MG EC tablet Take 325 mg by mouth daily.        Marland Kitchen atorvastatin (LIPITOR) 10 MG tablet TAKE ONE (1) TABLET BY MOUTH EVERY      DAY  30 tablet  11  . calcium carbonate (OS-CAL) 600 MG TABS Take 600 mg by mouth 2 (two) times daily with a meal.        . ferrous sulfate 325 (65  FE) MG tablet TAKE ONE TABLET BY MOUTH EVERY MORNING  WITH BREAKFAST  30 tablet  6  . glucose blood test strip (Freestyle) Use as instructed  100 each  3  . HYDROcodone-acetaminophen (NORCO/VICODIN) 5-325 MG per tablet Take 1 tablet by mouth every 6 (six) hours as needed for pain (sedation caution. ).  30 tablet  1  . imipramine (TOFRANIL) 50 MG tablet Take two tablets at bedtime  60 tablet  11  . Lancets (FREESTYLE) lancets CHECK BLOOD SUGAR ONCE DAILY AS DIRECTEDAND AS NEEDED  100 each  1  . Lancets Misc. MISC (Freestyle) Use as directed  100 each  3  . lisinopril (PRINIVIL,ZESTRIL) 10 MG tablet TAKE ONE (1) TABLET BY MOUTH EVERY      DAY  30 tablet  11  . Multiple Vitamin (MULTIVITAMIN) capsule Take 1 capsule by mouth daily.        . Omega-3 Fatty Acids 300 MG CAPS Take 1 capsule by mouth 3 (three) times daily.        Marland Kitchen omeprazole (PRILOSEC) 20 MG capsule TAKE ONE CAPSULE BY MOUTH DAILY  30 capsule  11    Allergies  Allergen Reactions  . Diclofenac Sodium     REACTION: vomiting   . Fluoxetine Hcl     REACTION: vomiting    Review of Systems  Review of Systems  Constitutional: Positive for malaise/fatigue. Negative for fever.  HENT: Negative for congestion.   Eyes: Negative for discharge.  Respiratory: Negative for shortness of breath.   Cardiovascular: Negative for chest pain, palpitations and leg swelling.  Gastrointestinal: Negative for nausea, abdominal pain and diarrhea.  Genitourinary: Negative for dysuria.  Musculoskeletal: Positive for myalgias and joint pain. Negative for falls.       Right posterior hip pain with radiation down right leg to knee.   Skin: Negative for rash.  Neurological: Positive for dizziness. Negative for loss of consciousness and headaches.       Felt woozey upon arising this am. No falls or syncope  Endo/Heme/Allergies: Negative for polydipsia.  Psychiatric/Behavioral: Negative for depression and suicidal ideas. The patient is not nervous/anxious and does not have insomnia.     Objective  BP 98/52  Pulse 107  Temp 97.1 F (36.2 C) (Oral)  Ht 5' 10.5" (1.791 m)  Wt 234 lb (106.142 kg)  BMI 33.10 kg/m2  SpO2 97%  Physical Exam  Physical Exam  Constitutional: She is oriented to person, place, and time and well-developed, well-nourished, and in no distress. No distress.  HENT:  Head: Normocephalic and atraumatic.  Eyes: Conjunctivae normal are normal.  Neck: Neck supple. No thyromegaly present.  Cardiovascular: Normal rate, regular rhythm and normal heart sounds.   No murmur heard. Pulmonary/Chest: Effort normal and breath sounds normal. She has no wheezes.  Abdominal: She exhibits no distension and no mass.  Musculoskeletal: She exhibits tenderness. She exhibits no edema.       Pain with palp over right posterior hip joint. No pain over low back or left hip  Lymphadenopathy:    She has no cervical adenopathy.  Neurological: She is alert and oriented to person, place, and time.  Skin: Skin is warm and dry. No rash  noted. She is not diaphoretic.  Psychiatric: Memory, affect and judgment normal.    Lab Results  Component Value Date   TSH 2.11 01/07/2012   Lab Results  Component Value Date   WBC 7.3 01/07/2012   HGB 11.3* 01/07/2012   HCT 35.8* 01/07/2012  MCV 86.4 01/07/2012   PLT 180.0 01/07/2012   Lab Results  Component Value Date   CREATININE 1.2 01/07/2012   BUN 20 01/07/2012   NA 137 01/07/2012   K 4.2 01/07/2012   CL 103 01/07/2012   CO2 26 01/07/2012   Lab Results  Component Value Date   ALT 17 01/07/2012   AST 27 01/07/2012   ALKPHOS 64 01/07/2012   BILITOT 0.7 01/07/2012   Lab Results  Component Value Date   CHOL 143 01/07/2012   Lab Results  Component Value Date   HDL 43.20 01/07/2012   Lab Results  Component Value Date   LDLCALC 71 01/07/2012   Lab Results  Component Value Date   TRIG 145.0 01/07/2012   Lab Results  Component Value Date   CHOLHDL 3 01/07/2012     Assessment & Plan  Sacroiliac joint dysfunction Right hip, with symptoms radiating down to right knee. Started on Medrol dosepak and given Cyclobenzaprine for qhs use. mosit heat and return if no improvement  HYPERTENSION, ESSENTIAL NOS Low. Mildly dehydrated today, encouraged one gatorade a day and increased hydration.  DM2 (diabetes mellitus, type 2) Warned the steroids will put her sugar up some, so minimize carbs and eat more protein the next few days.

## 2012-04-03 ENCOUNTER — Telehealth: Payer: Self-pay

## 2012-04-03 DIAGNOSIS — M545 Low back pain: Secondary | ICD-10-CM

## 2012-04-03 NOTE — Telephone Encounter (Signed)
Appt made with Kirstin P[facility] and Noemi Chapel for 04/04/12 at 12:30. Erin [name]

## 2012-04-03 NOTE — Telephone Encounter (Signed)
I would like to refer her to orthopedics at this point since pain is so severe  Tell her I did the ref and Rosaria Ferries will call her to find out where she wants to go

## 2012-04-03 NOTE — Telephone Encounter (Signed)
Pt notified of referral and agrees, advise pt Rosaria Ferries will be calling her to set appt up

## 2012-04-03 NOTE — Telephone Encounter (Signed)
Pt left v/m re: rt hip pain radiates down leg to ankle. Pt saw Dr Damita Dunnings on 03/30/12 and Dr Charlett Blake on 04/01/12 at [facility]. Pt taking Hydrocodone-apap. Cyclobenzaprine and Medrol pak with no relief of pain. Pain level now is 10. Pt has tried ice and heat with no relief.[place].Please advise. Pt request call back.

## 2012-04-04 ENCOUNTER — Emergency Department (HOSPITAL_COMMUNITY): Payer: Medicare Other

## 2012-04-04 ENCOUNTER — Telehealth: Payer: Self-pay | Admitting: *Deleted

## 2012-04-04 ENCOUNTER — Emergency Department (HOSPITAL_COMMUNITY)
Admission: EM | Admit: 2012-04-04 | Discharge: 2012-04-04 | Disposition: A | Discharge status: Home / Self Care | Payer: Medicare Other | Attending: Emergency Medicine | Admitting: Emergency Medicine

## 2012-04-04 ENCOUNTER — Encounter (HOSPITAL_COMMUNITY): Payer: Self-pay | Admitting: Cardiology

## 2012-04-04 DIAGNOSIS — Z8719 Personal history of other diseases of the digestive system: Secondary | ICD-10-CM | POA: Insufficient documentation

## 2012-04-04 DIAGNOSIS — Z87891 Personal history of nicotine dependence: Secondary | ICD-10-CM | POA: Insufficient documentation

## 2012-04-04 DIAGNOSIS — E785 Hyperlipidemia, unspecified: Secondary | ICD-10-CM | POA: Insufficient documentation

## 2012-04-04 DIAGNOSIS — Z79899 Other long term (current) drug therapy: Secondary | ICD-10-CM | POA: Insufficient documentation

## 2012-04-04 DIAGNOSIS — E669 Obesity, unspecified: Secondary | ICD-10-CM | POA: Insufficient documentation

## 2012-04-04 DIAGNOSIS — R55 Syncope and collapse: Secondary | ICD-10-CM | POA: Diagnosis not present

## 2012-04-04 DIAGNOSIS — R5381 Other malaise: Secondary | ICD-10-CM | POA: Diagnosis not present

## 2012-04-04 DIAGNOSIS — R61 Generalized hyperhidrosis: Secondary | ICD-10-CM | POA: Diagnosis not present

## 2012-04-04 DIAGNOSIS — I10 Essential (primary) hypertension: Secondary | ICD-10-CM | POA: Insufficient documentation

## 2012-04-04 DIAGNOSIS — R5383 Other fatigue: Secondary | ICD-10-CM | POA: Diagnosis not present

## 2012-04-04 DIAGNOSIS — R404 Transient alteration of awareness: Secondary | ICD-10-CM | POA: Diagnosis not present

## 2012-04-04 DIAGNOSIS — M1990 Unspecified osteoarthritis, unspecified site: Secondary | ICD-10-CM | POA: Insufficient documentation

## 2012-04-04 DIAGNOSIS — M549 Dorsalgia, unspecified: Secondary | ICD-10-CM | POA: Diagnosis not present

## 2012-04-04 DIAGNOSIS — M810 Age-related osteoporosis without current pathological fracture: Secondary | ICD-10-CM | POA: Insufficient documentation

## 2012-04-04 DIAGNOSIS — E119 Type 2 diabetes mellitus without complications: Secondary | ICD-10-CM | POA: Insufficient documentation

## 2012-04-04 DIAGNOSIS — Z8739 Personal history of other diseases of the musculoskeletal system and connective tissue: Secondary | ICD-10-CM | POA: Insufficient documentation

## 2012-04-04 LAB — BASIC METABOLIC PANEL
CO2: 23 mEq/L (ref 19–32)
Calcium: 9.9 mg/dL (ref 8.4–10.5)
Potassium: 4.4 mEq/L (ref 3.5–5.1)
Sodium: 135 mEq/L (ref 135–145)

## 2012-04-04 LAB — URINE MICROSCOPIC-ADD ON

## 2012-04-04 LAB — CBC WITH DIFFERENTIAL/PLATELET
Basophils Absolute: 0 10*3/uL (ref 0.0–0.1)
Eosinophils Relative: 0 % (ref 0–5)
Lymphocytes Relative: 26 % (ref 12–46)
Lymphs Abs: 3.3 10*3/uL (ref 0.7–4.0)
Monocytes Relative: 8 % (ref 3–12)
Neutrophils Relative %: 66 % (ref 43–77)
Platelets: 191 10*3/uL (ref 150–400)
RBC: 4.8 MIL/uL (ref 3.87–5.11)
RDW: 14.3 % (ref 11.5–15.5)
WBC: 12.5 10*3/uL — ABNORMAL HIGH (ref 4.0–10.5)

## 2012-04-04 LAB — URINALYSIS, ROUTINE W REFLEX MICROSCOPIC
Nitrite: NEGATIVE
Protein, ur: 30 mg/dL — AB
Specific Gravity, Urine: 1.025 (ref 1.005–1.030)
Urobilinogen, UA: 1 mg/dL (ref 0.0–1.0)

## 2012-04-04 LAB — TROPONIN I: Troponin I: <0.3 ng/mL (ref <0.30)

## 2012-04-04 MED ORDER — OXYCODONE-ACETAMINOPHEN 5-325 MG PO TABS: 1.0000 | ORAL_TABLET | Freq: Four times a day (QID) | ORAL | Status: DC | PRN

## 2012-04-04 MED ORDER — KETOROLAC TROMETHAMINE 30 MG/ML IJ SOLN
30.0000 mg | Freq: Once | INTRAMUSCULAR | Status: AC
  Administered 2012-04-04: 30 mg via INTRAMUSCULAR
  Filled 2012-04-04: qty 1

## 2012-04-04 MED ORDER — OXYCODONE-ACETAMINOPHEN 5-325 MG PO TABS
1.0000 | ORAL_TABLET | Freq: Once | ORAL | Status: AC
  Administered 2012-04-04: 1 via ORAL
  Filled 2012-04-04: qty 1

## 2012-04-04 NOTE — Telephone Encounter (Signed)
Pt's daughter notified of Dr. Marliss Coots recommendations and advise pt's daughter that Rosaria Ferries will call back with new appt info once she gets it, pt's daughter asked that we call her cell # [phone] with new appt info since pt isn't at home

## 2012-04-04 NOTE — Telephone Encounter (Signed)
Pt had appt to be seen at [facility] today, but she was taken to the hosp for syncope this am and missed appt. Syncopal episode is improving pt is still at hosp, but daughter is concerned now because pt is not getting any relief from the meds she is on (cyclobenzaprine, prednisone, and vicodin) and is unable to sleep at night due to pain. Request something stronger called in to help her get through the weekend. I advised daughter that Dr Glori Bickers was not in the office, but I would forward to another Dr for review on meds.  [place]: Pt's daughter called Raliegh Ip to reschedule appt and was told that we would have to contact the office to reschedule since she missed appt today. Can you reschedule and call daughter with appt info?

## 2012-04-04 NOTE — Telephone Encounter (Signed)
If she is in the hospital for syncope -- I am extremely hesitant about px stronger  pain med -- since strong pain medicines can cause fainting and dizziness... In addition, anything stronger than vicodin would require a written px picked up .Marland Kitchen And I am not in the office to do this  If she is in the ER - she will need to see what the doctors there recommend and think is safe for her

## 2012-04-04 NOTE — ED Notes (Signed)
Pt to department via EMS from home- pt reports she started feeling weak in her legs and became diaphoretic. Pt reports pain in her lower back and right leg that is chronic. Pt A&Ox4 on arrival. Bp- 118/82 Hr-90 CBG-193 22g left hand.

## 2012-04-04 NOTE — ED Provider Notes (Signed)
History     CSN: [id number]  Arrival date & time 04/04/12  1007   First MD Initiated Contact with Patient 04/04/12 1043      Chief Complaint  Patient presents with  . Near Syncope    (Consider location/radiation/quality/duration/timing/severity/associated sxs/prior treatment) HPI  Pt to the ER by EMS from home for near syncopal episode. She began to feel weak in her legs, she  became diaphoretic and nearly passed out. The pts family member who was their said she went limp  and her eyes rolled into the back of her head. The patient was getting ready and on her way to her first  Neurologist appointment for evaluation of her chronic back pain. She denies having any other  associated symptoms. She denies any focal weakness, residual weakness, chest pain, headache,  change in vision, vomiting, diarrhea. She is a borderline diabetic and checks her sugars on a regular  basis. Her sugars are normal close to 100. Today she has not eaten and her sugar is 193. As of now  she feels a bit tired and weak. She denies any other symptoms at this time. The family member that  is with her has called her Neurologist to reschedule. nad vss  Past Medical History  Diagnosis Date  . Diabetes mellitus   . Hyperlipidemia   . Osteoarthritis   . Osteoporosis   . Hypertension   . Obesity   . Upper GI bleed     AV malformation/when anticoag  . Sacroiliac joint dysfunction 04/01/2012    Past Surgical History  Procedure Date  . Appendectomy   . Cholecystectomy   . Colon resection 1988    secondary to cancer  . Polypectomy 1998    benign X 2  . Cervicitis 1964    conization   . Total knee arthroplasty 07/2009    right   . Endometrial polyp 05/2000    hyperplasia-laser treatment    Family History  Problem Relation Age of Onset  . Stroke Father   . Cancer Brother     lung    History  Substance Use Topics  . Smoking status: Former Smoker    Quit date: 08/13/2000  . Smokeless tobacco:  Never Used     Comment: Quit over 10 years ago  . Alcohol Use: No    OB History    Grav Para Term Preterm Abortions TAB SAB Ect Mult Living                  Review of Systems  Review of Systems  Gen: no weight loss, fevers, chills, night sweats, + weakness and near syncope Eyes: no discharge or drainage, no occular pain or visual changes  Nose: no epistaxis or rhinorrhea  Mouth: no dental pain, no sore throat  Neck: no neck pain  Lungs:No wheezing, coughing or hemoptysis CV: no chest pain, palpitations, dependent edema or orthopnea  Abd: no abdominal pain, nausea, vomiting  GU: no dysuria or gross hematuria  MSK:  Chronic back pain Neuro: no headache, no focal neurologic deficits  Skin: no abnormalities Psyche: negative.   Allergies  Diclofenac sodium and Fluoxetine hcl  Home Medications   Current Outpatient Rx  Name  Route  Sig  Dispense  Refill  . VITAMIN C 500 MG PO TABS   Oral   Take 500 mg by mouth daily.           . ASPIRIN 325 MG PO TBEC   Oral   Take 325 mg  by mouth daily.           . ATORVASTATIN CALCIUM 10 MG PO TABS      TAKE ONE (1) TABLET BY MOUTH EVERY      DAY   30 tablet   11   . CALCIUM CARBONATE 600 MG PO TABS   Oral   Take 600 mg by mouth 2 (two) times daily with a meal.           . CYCLOBENZAPRINE HCL 10 MG PO TABS   Oral   Take 1 tablet (10 mg total) by mouth at bedtime as needed for muscle spasms.   30 tablet   0   . FERROUS SULFATE 325 (65 FE) MG PO TABS      TAKE ONE TABLET BY MOUTH EVERY MORNING  WITH BREAKFAST   30 tablet   6   . GLUCOSE BLOOD VI STRP      (Freestyle) Use as instructed   100 each   3   . HYDROCODONE-ACETAMINOPHEN 5-325 MG PO TABS   Oral   Take 1 tablet by mouth every 6 (six) hours as needed for pain (sedation caution. ).   30 tablet   1   . IMIPRAMINE HCL 50 MG PO TABS      Take two tablets at bedtime   60 tablet   11   . FREESTYLE LANCETS MISC      CHECK BLOOD SUGAR ONCE DAILY AS  DIRECTEDAND AS NEEDED   100 each   1   . LANCETS MISC. MISC      (Freestyle) Use as directed   100 each   3   . LISINOPRIL 10 MG PO TABS      TAKE ONE (1) TABLET BY MOUTH EVERY      DAY   30 tablet   11   . METHYLPREDNISOLONE 4 MG PO KIT      follow package directions   21 tablet   0   . MULTIVITAMINS PO CAPS   Oral   Take 1 capsule by mouth daily.           . OMEGA-3 FATTY ACIDS 300 MG PO CAPS   Oral   Take 1 capsule by mouth 3 (three) times daily.           Marland Kitchen OMEPRAZOLE 20 MG PO CPDR      TAKE ONE CAPSULE BY MOUTH DAILY   30 capsule   11   . OXYCODONE-ACETAMINOPHEN 5-325 MG PO TABS   Oral   Take 1 tablet by mouth every 6 (six) hours as needed for pain.   15 tablet   0     BP 162/75  Pulse 91  Temp 98.1 F (36.7 C) (Oral)  Resp 14  SpO2 100%  Physical Exam  Nursing note and vitals reviewed. Constitutional: She is oriented to person, place, and time. She appears well-developed and well-nourished. No distress.  HENT:  Head: Normocephalic and atraumatic.  Eyes: Pupils are equal, round, and reactive to light.  Neck: Normal range of motion. Neck supple.  Cardiovascular: Normal rate and regular rhythm.   Pulmonary/Chest: Effort normal.  Abdominal: Soft.  Musculoskeletal:       Back:        Equal strength to bilateral lower extremities. Neurosensory  function adequate to both legs. Skin color is normal. Skin is warm and moist. I see no step off deformity, no bony tenderness. Pt is able to ambulate without limp. Pain is relieved when  sitting in certain positions. ROM is decreased due to pain. No crepitus, laceration, effusion, swelling.  Pulses are normal    Neurological: She is alert and oriented to person, place, and time. She has normal strength. No cranial nerve deficit or sensory deficit.  Skin: Skin is warm and dry.    ED Course  Procedures (including critical care time)  Labs Reviewed  URINALYSIS, ROUTINE W REFLEX MICROSCOPIC - Abnormal;  Notable for the following:    Color, Urine AMBER (*)  BIOCHEMICALS MAY BE AFFECTED BY COLOR   APPearance TURBID (*)     Protein, ur 30 (*)     Leukocytes, UA MODERATE (*)     All other components within normal limits  CBC WITH DIFFERENTIAL - Abnormal; Notable for the following:    WBC 12.5 (*)     Neutro Abs 8.2 (*)     All other components within normal limits  BASIC METABOLIC PANEL - Abnormal; Notable for the following:    Glucose, Bld 131 (*)     BUN 25 (*)     Creatinine, Ser 1.40 (*)     GFR calc non Af Amer 34 (*)     GFR calc Af Amer 40 (*)     All other components within normal limits  URINE MICROSCOPIC-ADD ON - Abnormal; Notable for the following:    Squamous Epithelial / LPF MANY (*)     Bacteria, UA FEW (*)     Casts GRANULAR CAST (*)     All other components within normal limits  TROPONIN I  URINE CULTURE   Dg Chest 2 View  04/04/2012  *RADIOLOGY REPORT*  Clinical Data: Near syncope  CHEST - 2 VIEW  Comparison: 07/13/2009  Findings: Heart size and vascular pattern are normal.  Lungs are clear.  No change from prior study.  IMPRESSION: Negative chest radiographs.   Original Report Authenticated By: Skipper Cliche, M.D.    Ct Head Wo Contrast  04/04/2012  *RADIOLOGY REPORT*  Clinical Data: Near-syncope  CT HEAD WITHOUT CONTRAST  Technique:  Contiguous axial images were obtained from the base of the skull through the vertex without contrast.  Comparison: None.  Findings: No skull fracture is noted.  Paranasal sinuses and mastoid air cells are unremarkable.  No intracranial hemorrhage, mass effect or midline shift.  Mild cerebral atrophy.  Periventricular and patchy subcortical white matter decreased attenuation probable due to chronic small vessel ischemic changes.  No acute infarction.  No mass lesion is noted on this unenhanced scan.  IMPRESSION: No acute intracranial abnormality.  Mild cerebral atrophy. Periventricular and patchy subcortical white matter decreased  attenuation probable due to chronic small vessel ischemic changes.   Original Report Authenticated By: Lahoma Crocker, M.D.      1. Near syncope       MDM   Date: 04/04/2012  Rate: 93  Rhythm: normal sinus rhythm  QRS Axis: normal  Intervals: normal  ST/T Wave abnormalities: normal  Conduction Disutrbances:none  Narrative Interpretation:   Old EKG Reviewed: none available   Pts work-up is essentially negative without any cause for her near-syncope found.  I have had a long discussion with patient and two family members that are present that since I do not know the cause of her diaphoresis and near syncope that I recommend she be observed over night.  The patient and her family members want to take her home. They understand that the patient may have had an arrhythmia which is resolved now and it may  re occur again. If she stays in the hospital she can be monitored for 24 hours and if any abnormalities occur we can intervene.  They understand the risks and benefits of staying vs going home. They are well informed. They say she was sweaty because she was wearing her wool jacket and that always makes her sweat.  The patient and two present family members are all in agreement that they want to go home.  The patient was supposed to go see Neurology for her back pain and her appt wont be until this Thursday. The family had called the office and had it moved. She requests medications to help her over the next few days. She is currently on prednisone, Vicodin and flexeril and they are not sufficient for patients pain.  I have given 30mg  IM Toradol in ED as well as 1 percocet.  Rx Percocet. Pt to call PCP for follow-up  Pt has been advised of the symptoms that warrant their return to the ED. Patient has voiced understanding and has agreed to follow-up with the PCP or specialist.      Linus Mako, [place] 04/04/12 1546

## 2012-04-05 NOTE — ED Provider Notes (Signed)
Medical screening examination/treatment/procedure(s) were performed by non-physician practitioner and as supervising physician I was immediately available for consultation/collaboration.  Jasper Riling. Alvino Chapel, MD 04/05/12 1539

## 2012-04-06 LAB — URINE CULTURE: Colony Count: 60000

## 2012-04-07 ENCOUNTER — Ambulatory Visit (INDEPENDENT_AMBULATORY_CARE_PROVIDER_SITE_OTHER): Payer: Medicare Other | Admitting: Family Medicine

## 2012-04-07 ENCOUNTER — Emergency Department (HOSPITAL_COMMUNITY): Payer: Medicare Other

## 2012-04-07 ENCOUNTER — Encounter: Payer: Self-pay | Admitting: Family Medicine

## 2012-04-07 ENCOUNTER — Inpatient Hospital Stay (HOSPITAL_COMMUNITY)
Admission: EM | Admit: 2012-04-07 | Discharge: 2012-04-10 | DRG: 690 | Disposition: A | Discharge status: Home Health | Payer: Medicare Other | Attending: Family Medicine | Admitting: Family Medicine

## 2012-04-07 ENCOUNTER — Encounter (HOSPITAL_COMMUNITY): Payer: Self-pay | Admitting: Nurse Practitioner

## 2012-04-07 VITALS — BP 112/68 | HR 98 | Temp 97.5°F | Ht 70.5 in

## 2012-04-07 DIAGNOSIS — Z87891 Personal history of nicotine dependence: Secondary | ICD-10-CM

## 2012-04-07 DIAGNOSIS — N179 Acute kidney failure, unspecified: Secondary | ICD-10-CM | POA: Diagnosis not present

## 2012-04-07 DIAGNOSIS — A498 Other bacterial infections of unspecified site: Secondary | ICD-10-CM | POA: Diagnosis present

## 2012-04-07 DIAGNOSIS — Z85038 Personal history of other malignant neoplasm of large intestine: Secondary | ICD-10-CM

## 2012-04-07 DIAGNOSIS — N289 Disorder of kidney and ureter, unspecified: Secondary | ICD-10-CM | POA: Diagnosis present

## 2012-04-07 DIAGNOSIS — E785 Hyperlipidemia, unspecified: Secondary | ICD-10-CM | POA: Diagnosis present

## 2012-04-07 DIAGNOSIS — M5430 Sciatica, unspecified side: Secondary | ICD-10-CM | POA: Diagnosis present

## 2012-04-07 DIAGNOSIS — R3989 Other symptoms and signs involving the genitourinary system: Secondary | ICD-10-CM | POA: Diagnosis present

## 2012-04-07 DIAGNOSIS — Z79899 Other long term (current) drug therapy: Secondary | ICD-10-CM | POA: Diagnosis not present

## 2012-04-07 DIAGNOSIS — E871 Hypo-osmolality and hyponatremia: Secondary | ICD-10-CM

## 2012-04-07 DIAGNOSIS — R7303 Prediabetes: Secondary | ICD-10-CM | POA: Diagnosis present

## 2012-04-07 DIAGNOSIS — R630 Anorexia: Secondary | ICD-10-CM | POA: Diagnosis present

## 2012-04-07 DIAGNOSIS — C189 Malignant neoplasm of colon, unspecified: Secondary | ICD-10-CM | POA: Diagnosis not present

## 2012-04-07 DIAGNOSIS — K5900 Constipation, unspecified: Secondary | ICD-10-CM | POA: Insufficient documentation

## 2012-04-07 DIAGNOSIS — Z66 Do not resuscitate: Secondary | ICD-10-CM | POA: Diagnosis present

## 2012-04-07 DIAGNOSIS — M545 Low back pain: Secondary | ICD-10-CM

## 2012-04-07 DIAGNOSIS — M533 Sacrococcygeal disorders, not elsewhere classified: Secondary | ICD-10-CM | POA: Diagnosis present

## 2012-04-07 DIAGNOSIS — Z7982 Long term (current) use of aspirin: Secondary | ICD-10-CM

## 2012-04-07 DIAGNOSIS — D62 Acute posthemorrhagic anemia: Secondary | ICD-10-CM | POA: Diagnosis not present

## 2012-04-07 DIAGNOSIS — E119 Type 2 diabetes mellitus without complications: Secondary | ICD-10-CM | POA: Diagnosis not present

## 2012-04-07 DIAGNOSIS — D72829 Elevated white blood cell count, unspecified: Secondary | ICD-10-CM | POA: Diagnosis present

## 2012-04-07 DIAGNOSIS — Z9049 Acquired absence of other specified parts of digestive tract: Secondary | ICD-10-CM

## 2012-04-07 DIAGNOSIS — R7989 Other specified abnormal findings of blood chemistry: Secondary | ICD-10-CM | POA: Diagnosis not present

## 2012-04-07 DIAGNOSIS — N390 Urinary tract infection, site not specified: Secondary | ICD-10-CM | POA: Diagnosis not present

## 2012-04-07 DIAGNOSIS — I10 Essential (primary) hypertension: Secondary | ICD-10-CM | POA: Diagnosis present

## 2012-04-07 DIAGNOSIS — R5383 Other fatigue: Secondary | ICD-10-CM

## 2012-04-07 DIAGNOSIS — E860 Dehydration: Secondary | ICD-10-CM

## 2012-04-07 DIAGNOSIS — E669 Obesity, unspecified: Secondary | ICD-10-CM | POA: Diagnosis present

## 2012-04-07 DIAGNOSIS — R531 Weakness: Secondary | ICD-10-CM

## 2012-04-07 DIAGNOSIS — M810 Age-related osteoporosis without current pathological fracture: Secondary | ICD-10-CM | POA: Diagnosis present

## 2012-04-07 DIAGNOSIS — M1990 Unspecified osteoarthritis, unspecified site: Secondary | ICD-10-CM | POA: Diagnosis present

## 2012-04-07 DIAGNOSIS — R5381 Other malaise: Secondary | ICD-10-CM | POA: Diagnosis not present

## 2012-04-07 DIAGNOSIS — R404 Transient alteration of awareness: Secondary | ICD-10-CM | POA: Diagnosis not present

## 2012-04-07 DIAGNOSIS — K439 Ventral hernia without obstruction or gangrene: Secondary | ICD-10-CM | POA: Diagnosis present

## 2012-04-07 DIAGNOSIS — R739 Hyperglycemia, unspecified: Secondary | ICD-10-CM

## 2012-04-07 DIAGNOSIS — M25559 Pain in unspecified hip: Secondary | ICD-10-CM | POA: Diagnosis not present

## 2012-04-07 LAB — BASIC METABOLIC PANEL
CO2: 22 mEq/L (ref 19–32)
Chloride: 91 mEq/L — ABNORMAL LOW (ref 96–112)
GFR calc Af Amer: 32 mL/min — ABNORMAL LOW (ref >90)
Potassium: 4.1 mEq/L (ref 3.5–5.1)

## 2012-04-07 LAB — URINALYSIS, ROUTINE W REFLEX MICROSCOPIC
Nitrite: NEGATIVE
Protein, ur: NEGATIVE mg/dL
Urobilinogen, UA: 1 mg/dL (ref 0.0–1.0)

## 2012-04-07 LAB — CBC
HCT: 42.8 % (ref 36.0–46.0)
Platelets: 230 10*3/uL (ref 150–400)
RBC: 5.1 MIL/uL (ref 3.87–5.11)
RDW: 14.1 % (ref 11.5–15.5)
WBC: 16.6 10*3/uL — ABNORMAL HIGH (ref 4.0–10.5)

## 2012-04-07 LAB — POCT I-STAT TROPONIN I: Troponin i, poc: 0 ng/mL (ref 0.00–0.08)

## 2012-04-07 LAB — OCCULT BLOOD, POC DEVICE
Fecal Occult Bld: NEGATIVE
Fecal Occult Bld: POSITIVE — AB

## 2012-04-07 LAB — URINE MICROSCOPIC-ADD ON

## 2012-04-07 MED ORDER — DEXTROSE 5 % IV SOLN
1.0000 g | Freq: Once | INTRAVENOUS | Status: AC
  Administered 2012-04-07: 1 g via INTRAVENOUS
  Filled 2012-04-07: qty 10

## 2012-04-07 MED ORDER — PANTOPRAZOLE SODIUM 40 MG PO TBEC
40.0000 mg | DELAYED_RELEASE_TABLET | Freq: Every day | ORAL | Status: DC
  Administered 2012-04-08 – 2012-04-10 (×3): 40 mg via ORAL
  Filled 2012-04-07 (×2): qty 1

## 2012-04-07 MED ORDER — ALBUTEROL SULFATE (5 MG/ML) 0.5% IN NEBU: 2.5000 mg | INHALATION_SOLUTION | RESPIRATORY_TRACT | Status: DC | PRN

## 2012-04-07 MED ORDER — ATORVASTATIN CALCIUM 10 MG PO TABS
10.0000 mg | ORAL_TABLET | Freq: Every day | ORAL | Status: DC
  Administered 2012-04-08 – 2012-04-10 (×3): 10 mg via ORAL
  Filled 2012-04-07 (×3): qty 1

## 2012-04-07 MED ORDER — CALCIUM CARBONATE 1250 (500 CA) MG PO TABS
1250.0000 mg | ORAL_TABLET | Freq: Two times a day (BID) | ORAL | Status: DC
  Administered 2012-04-08 – 2012-04-10 (×5): 1250 mg via ORAL
  Filled 2012-04-07 (×9): qty 1

## 2012-04-07 MED ORDER — MORPHINE SULFATE 2 MG/ML IJ SOLN: 1.0000 mg | INTRAMUSCULAR | Status: DC | PRN

## 2012-04-07 MED ORDER — VITAMIN C 500 MG PO TABS
500.0000 mg | ORAL_TABLET | Freq: Every day | ORAL | Status: DC
  Administered 2012-04-08 – 2012-04-10 (×3): 500 mg via ORAL
  Filled 2012-04-07 (×3): qty 1

## 2012-04-07 MED ORDER — METOPROLOL TARTRATE 50 MG PO TABS
50.0000 mg | ORAL_TABLET | Freq: Two times a day (BID) | ORAL | Status: DC
  Administered 2012-04-07 – 2012-04-10 (×6): 50 mg via ORAL
  Filled 2012-04-07 (×7): qty 1

## 2012-04-07 MED ORDER — ONDANSETRON HCL 4 MG PO TABS: 4.0000 mg | ORAL_TABLET | Freq: Four times a day (QID) | ORAL | Status: DC | PRN

## 2012-04-07 MED ORDER — ASPIRIN EC 325 MG PO TBEC
325.0000 mg | DELAYED_RELEASE_TABLET | Freq: Every day | ORAL | Status: DC
  Administered 2012-04-08 – 2012-04-10 (×3): 325 mg via ORAL
  Filled 2012-04-07 (×3): qty 1

## 2012-04-07 MED ORDER — GUAIFENESIN-DM 100-10 MG/5ML PO SYRP
5.0000 mL | ORAL_SOLUTION | ORAL | Status: DC | PRN
  Filled 2012-04-07: qty 5

## 2012-04-07 MED ORDER — IMIPRAMINE HCL 50 MG PO TABS
100.0000 mg | ORAL_TABLET | Freq: Every day | ORAL | Status: DC
  Administered 2012-04-08 – 2012-04-09 (×3): 100 mg via ORAL
  Filled 2012-04-07 (×4): qty 2

## 2012-04-07 MED ORDER — POLYETHYLENE GLYCOL 3350 17 G PO PACK
17.0000 g | PACK | Freq: Every day | ORAL | Status: DC | PRN
  Filled 2012-04-07: qty 1

## 2012-04-07 MED ORDER — ONDANSETRON HCL 4 MG/2ML IJ SOLN: 4.0000 mg | Freq: Four times a day (QID) | INTRAMUSCULAR | Status: DC | PRN

## 2012-04-07 MED ORDER — CYCLOBENZAPRINE HCL 10 MG PO TABS: 10.0000 mg | ORAL_TABLET | Freq: Every evening | ORAL | Status: DC | PRN

## 2012-04-07 MED ORDER — PANTOPRAZOLE SODIUM 40 MG PO TBEC
40.0000 mg | DELAYED_RELEASE_TABLET | Freq: Every day | ORAL | Status: DC
  Filled 2012-04-07: qty 1

## 2012-04-07 MED ORDER — INSULIN ASPART 100 UNIT/ML ~~LOC~~ SOLN
0.0000 [IU] | Freq: Three times a day (TID) | SUBCUTANEOUS | Status: DC
  Administered 2012-04-10: 1 [IU] via SUBCUTANEOUS

## 2012-04-07 MED ORDER — FERROUS SULFATE 325 (65 FE) MG PO TABS
325.0000 mg | ORAL_TABLET | Freq: Every day | ORAL | Status: DC
  Administered 2012-04-08 – 2012-04-10 (×3): 325 mg via ORAL
  Filled 2012-04-07 (×5): qty 1

## 2012-04-07 MED ORDER — HYDROCODONE-ACETAMINOPHEN 5-325 MG PO TABS
1.0000 | ORAL_TABLET | ORAL | Status: DC | PRN
  Administered 2012-04-09 (×2): 2 via ORAL
  Filled 2012-04-07 (×2): qty 2

## 2012-04-07 MED ORDER — SODIUM CHLORIDE 0.9 % IJ SOLN
3.0000 mL | Freq: Two times a day (BID) | INTRAMUSCULAR | Status: DC
  Administered 2012-04-08 – 2012-04-09 (×3): 3 mL via INTRAVENOUS

## 2012-04-07 MED ORDER — DEXTROSE 5 % IV SOLN
1.0000 g | Freq: Every day | INTRAVENOUS | Status: DC
  Administered 2012-04-08 – 2012-04-09 (×2): 1 g via INTRAVENOUS
  Filled 2012-04-07 (×3): qty 10

## 2012-04-07 MED ORDER — BISACODYL 10 MG RE SUPP: 10.0000 mg | Freq: Every day | RECTAL | Status: DC | PRN

## 2012-04-07 MED ORDER — SODIUM CHLORIDE 0.9 % IV SOLN
INTRAVENOUS | Status: DC
  Administered 2012-04-07 – 2012-04-09 (×3): via INTRAVENOUS
  Administered 2012-04-10: 75 mL via INTRAVENOUS

## 2012-04-07 NOTE — ED Notes (Signed)
Pt in xray now.

## 2012-04-07 NOTE — Progress Notes (Signed)
Report given by ED nurse

## 2012-04-07 NOTE — ED Notes (Addendum)
Pt states she has been dizzy since last week, was seen here last week and discharged for same. Reports dizziness persists. Denies falls or LOC. States she had some R arm pain today that is gone now. Negative stroke screen, A&Ox4. Pt went to PCP today and they sent her for further work up

## 2012-04-07 NOTE — Assessment & Plan Note (Signed)
ucx from ER visit 12/24 - came back pos for ecoli Pt is nauseated and toxic appearing- will likely not keep down oral med-sending to ER now

## 2012-04-07 NOTE — ED Notes (Signed)
Fecal occult is POSITIVE

## 2012-04-07 NOTE — ED Notes (Signed)
Patient to CDU #10 to hold for admission. Report given to [place], RN.

## 2012-04-07 NOTE — H&P (Signed)
[facility]                                                                                    Patient Demographics  Erin [name], is a 78 y.o. female  CSN: [id number]  MRN: [id number]  DOB - 1928/04/14  Admit Date - 04/07/2012  Outpatient Primary MD for the patient is [name], MD   With History of -  Past Medical History  Diagnosis Date  . Diabetes mellitus   . Hyperlipidemia   . Osteoarthritis   . Osteoporosis   . Hypertension   . Obesity   . Upper GI bleed     AV malformation/when anticoag  . Sacroiliac joint dysfunction 04/01/2012      Past Surgical History  Procedure Date  . Appendectomy   . Cholecystectomy   . Colon resection 1988    secondary to cancer  . Polypectomy 1998    benign X 2  . Cervicitis 1964    conization   . Total knee arthroplasty 07/2009    right   . Endometrial polyp 05/2000    hyperplasia-laser treatment    in for   Chief Complaint  Patient presents with  . Dizziness     HPI  Erin [name]  is a 78 y.o. female, with history of hypertension, dyslipidemia, diabetes mellitus type 2 with diet control who had acute on chronic flareup of lower back pain with right-sided sciatica for the last few weeks, she had an appointment with a neurosurgeon in the next week or so, patient initially presented to ER a few days ago with an episode of dizziness which was attribute it to diagnosis of dehydration and UTI, at that point she was not placed on antibiotics cultures were ordered, patient says that her back pain actually has improved it is not bothering her however since her ER discharge she has continued to be weak and dizzy, she presented again to the ER today and she was diagnosed with Escherichia coli UTI based on her recent cultures, dehydration with acute renal insufficiency and I was called to admit the patient.   She denies any fever chills, no cough no abdominal pain, she is constipated, mild dysuria, denies any palpitations  or chest pain, relatively symptom-free except for generalized weakness and mild dizziness when she gets up.    Review of Systems    In addition to the HPI above, No Fever-chills, No Headache, No changes with Vision or hearing, No problems swallowing food or Liquids, No Chest pain, Cough or Shortness of Breath, No Abdominal pain, No Nausea or Vommitting, Bowel movements are regular, No Blood in stool or Urine, Mild dysuria, No new skin rashes or bruises, No new joints pains-aches,  No new weakness, tingling, numbness in any extremity, positive generalized weakness and dizziness No recent weight gain or loss, No polyuria, polydypsia or polyphagia, No significant Mental Stressors.  A full 10 point Review of Systems was done, except as stated above, all other Review of Systems were negative.   Social History History  Substance Use Topics  . Smoking status: Former Smoker    Quit date: 08/13/2000  . Smokeless tobacco: Never Used  Comment: Quit over 10 years ago  . Alcohol Use: No      Family History Family History  Problem Relation Age of Onset  . Stroke Father   . Cancer Brother     lung      Prior to Admission medications   Medication Sig Start Date End Date Taking? Authorizing Provider  Ascorbic Acid (VITAMIN C) 500 MG tablet Take 500 mg by mouth daily.     Yes Historical Provider, MD  aspirin 325 MG EC tablet Take 325 mg by mouth daily.     Yes Historical Provider, MD  atorvastatin (LIPITOR) 10 MG tablet Take 10 mg by mouth daily.   Yes Historical Provider, MD  bisacodyl (LAXATIVE) 10 MG suppository Place 10 mg rectally daily as needed. For constipation   Yes Historical Provider, MD  calcium carbonate (OS-CAL) 600 MG TABS Take 600 mg by mouth 2 (two) times daily with a meal.     Yes Historical Provider, MD  cyclobenzaprine (FLEXERIL) 10 MG tablet Take 1 tablet (10 mg total) by mouth at bedtime as needed for muscle spasms. 04/01/12  Yes Mosie Lukes, MD  ferrous  sulfate 325 (65 FE) MG tablet Take 325 mg by mouth daily with breakfast.   Yes Historical Provider, MD  imipramine (TOFRANIL) 50 MG tablet Take 100 mg by mouth at bedtime.   Yes Historical Provider, MD  lisinopril (PRINIVIL,ZESTRIL) 10 MG tablet Take 10 mg by mouth daily.   Yes Historical Provider, MD  Multiple Vitamin (MULTIVITAMIN) capsule Take 1 capsule by mouth daily.     Yes Historical Provider, MD  Omega-3 Fatty Acids 300 MG CAPS Take 1 capsule by mouth 3 (three) times daily.     Yes Historical Provider, MD  omeprazole (PRILOSEC) 20 MG capsule Take 20 mg by mouth daily.   Yes Historical Provider, MD  oxyCODONE-acetaminophen (PERCOCET/ROXICET) 5-325 MG per tablet Take 1 tablet by mouth every 6 (six) hours as needed for pain. 04/04/12  Yes Linus Mako, PA    Allergies  Allergen Reactions  . Diclofenac Sodium     REACTION: vomiting  . Fluoxetine Hcl     REACTION: vomiting    Physical Exam  Vitals  Blood pressure 164/62, pulse 98, temperature 97.9 F (36.6 C), temperature source Oral, resp. rate 20, SpO2 98.00%.   1. General elderly African American female lying in bed in NAD,    2. Normal affect and insight, Not Suicidal or Homicidal, Awake Alert, Oriented X 3.  3. No F.N deficits, ALL C.Nerves Intact, Strength 5/5 all 4 extremities, Sensation intact all 4 extremities, Plantars down going.  4. Ears and Eyes appear Normal, Conjunctivae clear, PERRLA. Moist Oral Mucosa.  5. Supple Neck, No JVD, No cervical lymphadenopathy appriciated, No Carotid Bruits.  6. Symmetrical Chest wall movement, Good air movement bilaterally, CTAB.  7. RRR, No Gallops, Rubs or Murmurs, No Parasternal Heave.  8. Positive Bowel Sounds, Abdomen Soft, Non tender, No organomegaly appriciated,No rebound -guarding or rigidity. Easily reducible abdominal wall hernia, Hemoccult positive stool.  9.  No Cyanosis, Normal Skin Turgor, No Skin Rash or Bruise.  10. Good muscle tone,  joints appear normal  , no effusions, Normal ROM.  11. No Palpable Lymph Nodes in Neck or Axillae     Data Review  CBC  Lab 04/07/12 1602 04/04/12 1202  WBC 16.6* 12.5*  HGB 14.2 13.1  HCT 42.8 40.9  PLT 230 191  MCV 83.9 85.2  MCH 27.8 27.3  MCHC 33.2 32.0  RDW 14.1 14.3  LYMPHSABS -- 3.3  MONOABS -- 1.0  EOSABS -- 0.0  BASOSABS -- 0.0  BANDABS -- --   ------------------------------------------------------------------------------------------------------------------  Chemistries   Lab 04/07/12 1602 04/04/12 1202  NA 130* 135  [name] 4.1 4.4  CL 91* 98  CO2 22 23  GLUCOSE 182* 131*  BUN 38* 25*  CREATININE 1.69* 1.40*  CALCIUM 10.1 9.9  MG -- --  AST -- --  ALT -- --  ALKPHOS -- --  BILITOT -- --   ------------------------------------------------------------------------------------------------------------------ CrCl is unknown because both a height and weight (above a minimum accepted value) are required for this calculation. ------------------------------------------------------------------------------------------------------------------ No results found for this basename: TSH,T4TOTAL,FREET3,T3FREE,THYROIDAB in the last 72 hours   Coagulation profile No results found for this basename: INR:5,PROTIME:5 in the last 168 hours ------------------------------------------------------------------------------------------------------------------- No results found for this basename: DDIMER:2 in the last 72 hours -------------------------------------------------------------------------------------------------------------------  Cardiac Enzymes  Lab 04/04/12 1202  CKMB --  TROPONINI <0.30  MYOGLOBIN --   ------------------------------------------------------------------------------------------------------------------ No components found with this basename:  POCBNP:3   ---------------------------------------------------------------------------------------------------------------  Urinalysis    Component Value Date/Time   COLORURINE AMBER* 04/07/2012 2057   APPEARANCEUR CLOUDY* 04/07/2012 2057   LABSPEC 1.024 04/07/2012 2057   PHURINE 5.0 04/07/2012 2057   GLUCOSEU NEGATIVE 04/07/2012 2057   HGBUR NEGATIVE 04/07/2012 2057   HGBUR large 03/18/2009 1613   BILIRUBINUR SMALL* 04/07/2012 2057   KETONESUR NEGATIVE 04/07/2012 2057   PROTEINUR NEGATIVE 04/07/2012 2057   UROBILINOGEN 1.0 04/07/2012 2057   NITRITE NEGATIVE 04/07/2012 2057   LEUKOCYTESUR MODERATE* 04/07/2012 2057    ----------------------------------------------------------------------------------------------------------------  Imaging results:   Dg Chest 2 View  04/04/2012  *RADIOLOGY REPORT*  Clinical Data: Near syncope  CHEST - 2 VIEW  Comparison: 07/13/2009  Findings: Heart size and vascular pattern are normal.  Lungs are clear.  No change from prior study.  IMPRESSION: Negative chest radiographs.   Original Report Authenticated By: Skipper Cliche, M.D.    Ct Head Wo Contrast  04/04/2012  *RADIOLOGY REPORT*  Clinical Data: Near-syncope  CT HEAD WITHOUT CONTRAST  Technique:  Contiguous axial images were obtained from the base of the skull through the vertex without contrast.  Comparison: None.  Findings: No skull fracture is noted.  Paranasal sinuses and mastoid air cells are unremarkable.  No intracranial hemorrhage, mass effect or midline shift.  Mild cerebral atrophy.  Periventricular and patchy subcortical white matter decreased attenuation probable due to chronic small vessel ischemic changes.  No acute infarction.  No mass lesion is noted on this unenhanced scan.  IMPRESSION: No acute intracranial abnormality.  Mild cerebral atrophy. Periventricular and patchy subcortical white matter decreased attenuation probable due to chronic small vessel ischemic changes.   Original  Report Authenticated By: Lahoma Crocker, M.D.     My personal review of EKG 04-04-12: Rhythm NSR,   no Acute ST changes    Assessment & Plan   1.UTI with leukocytosis - dehydration, Hyponatremia and Pre renal Azotemia - patient will be admitted to the hospital on a telemetry bed since she had dizziness,  IV Rocephin, IV fluids, check urine sodium creatinine and osmolality along with serum osmolality, repeat BMP in the morning, check orthostatics have PT evaluate the patient. Hold her ACE inhibitor.    2. Dizziness - due to #1 above, plan telemetry and as in #1 above.    3.HTN- due to renal insufficiency we will hold ACE inhibitor, will add moderate dose beta blocker and monitor blood pressure trend.     4.DM-2-  on no home medications, low carbohydrate diet, check A1c place on sliding scale insulin.     5.Chr R.Back Pain- Sciatica- back pain is actually improved, patient has outpatient appointment with neurosurgery she will follow post discharge with them, supportive care with pain control, PT evaluation.     6.H/O Colon CA- Haem +ve stool and Constipation - H&H is stable, home dose aspirin will be continued, post discharge outpatient GI followup. Will place on PPI. We'll place her on bowel regimen for constipation.     7.Abd Wall Hernia - no acute issues outpatient followup with PCP, hernia is easily reducible and nontender.     8. Acute renal insufficiency -  Due to #1 above, hold ACE inhibitor, IV fluids, repeat BMP.     DVT Prophylaxis   SCDs    AM Labs Ordered, also please review Full Orders  Family Communication: Admission, patients condition and plan of care including tests being ordered have been discussed with the patient and family who indicate understanding and agree with the plan and Code Status.  Code Status DNR  Disposition Plan: TBD  Time spent in minutes :35  Condition Erin [name] M.D on 04/07/2012 at 10:14 PM  Between 7am to 7pm -  Pager - [phone]  After 7pm go to [email or web address] - password [email or web address]  And look for the night coverage person covering me after hours  [facility] Office  [phone]

## 2012-04-07 NOTE — Progress Notes (Signed)
Subjective:    Patient ID: Erin [name], female    DOB: 23-Feb-1929, 78 y.o.   MRN: [id number]  HPI Here for visit for malaise/ ? Dehydration and constipation - after eval at ED for near syncope She is having fairly severe back pain - went to [facility] today and they called to say she looked dehydrated but she declined going to the ER Was given a laxative suppository for constipation due to narcotic pain med   R arm started to cramping earlier today- like someone is "grabbing it" Is sob at times Unable to get her on the table due to weakenss to get an EKG  In ER was given toradol shot and a percocet and she has been taking vicodin Her wbc was slt high and no acute findings on CT head at that time  There was no definite cause found for the near syncpe - but pt declined staying in hosp and family took her home  She thinks it was heat related and pain related ua was borderline also - then cx just returned with e coli that is pan sensitive   Oral hydration -- did drink some gatorade but vomited prune juice -then drank water and gatorade   Pt had to have a BM upon arrival due to the suppository    Chemistry      Component Value Date/Time   NA 135 04/04/2012 1202   K 4.4 04/04/2012 1202   CL 98 04/04/2012 1202   CO2 23 04/04/2012 1202   BUN 25* 04/04/2012 1202   CREATININE 1.40* 04/04/2012 1202      Component Value Date/Time   CALCIUM 9.9 04/04/2012 1202   ALKPHOS 64 01/07/2012 1249   AST 27 01/07/2012 1249   ALT 17 01/07/2012 1249   BILITOT 0.7 01/07/2012 1249     Lab Results  Component Value Date   WBC 12.5* 04/04/2012   HGB 13.1 04/04/2012   HCT 40.9 04/04/2012   MCV 85.2 04/04/2012   PLT 191 04/04/2012    .    Review of Systems Review of Systems  Constitutional: Negative for fever, appetite change,  and unexpected weight change. pos for fatigue/ malaise and profound weakness Eyes: Negative for pain and visual disturbance.  Respiratory: Negative for cough and shortness of  breath.   Cardiovascular: Negative for cp or palpitations    Gastrointestinal: Negative for diarrhea or blood in stool  Genitourinary: Negative for urgency and frequency. neg for flank pain or blood in urine  Skin: Negative for pallor or rash   Neurological: Negative for weakness, light-headedness, numbness and headaches.  Hematological: Negative for adenopathy. Does not bruise/bleed easily.  Psychiatric/Behavioral: Negative for dysphoric mood. The patient is not nervous/anxious.         Objective:   Physical Exam  Constitutional: She appears well-developed and well-nourished. No distress.       Pt is profoundly weak, unable to ambulate and unstable  Called EMS for transport to the ER  Is tachycardic   HENT:  Head: Normocephalic and atraumatic.  Eyes: Conjunctivae normal and EOM are normal. Pupils are equal, round, and reactive to light. No scleral icterus.  Neck: Normal range of motion. Neck supple. No JVD present. No thyromegaly present.  Cardiovascular: Normal heart sounds.   Pulmonary/Chest: Effort normal and breath sounds normal.  Abdominal: Soft. Bowel sounds are normal. She exhibits distension. She exhibits no mass. There is no tenderness.  Musculoskeletal: She exhibits no edema and no tenderness.  Lymphadenopathy:    She  has no cervical adenopathy.  Neurological:       Pt is awake but very sluggish  Skin: Skin is warm and dry. No rash noted.  Psychiatric: She has a normal mood and affect.          Assessment & Plan:

## 2012-04-07 NOTE — Assessment & Plan Note (Signed)
With recent narcotic pain med/ recent ER visit for syncope/ constipation  Also uti -pt is quite weak- do not think she can care for herself at this time- sending her to [place] via EMS

## 2012-04-07 NOTE — Telephone Encounter (Signed)
Appt rescheduled for today with PA and all records sent.

## 2012-04-07 NOTE — ED Provider Notes (Signed)
History     CSN: [id number]  Arrival date & time 04/07/12  1538   First MD Initiated Contact with Patient 04/07/12 2105      Chief Complaint  Patient presents with  . Dizziness    (Consider location/radiation/quality/duration/timing/severity/associated sxs/prior treatment) HPI Comments: Erin [name] is a 78 y.o. female who is here for weakness, decreased appetite, constipation, and evaluation of UTI. She saw her orthopedist, and PCP today. The PCP sent her here by EMS, for evaluation. Dr. Glori Bickers felt like she could not be managed at home. Patient has had worsening, weakness over the last one week. She has had low back and right leg pain for 2 weeks, she was treated with narcotics they have been gradually increased. She did not have a bowel movement until today, when she took a suppository. She's had several small, hard  bowel movements, since then. She's had decreased appetite. He is too weak to walk. There are no other modifying factors.  The history is provided by the patient and a relative.    Past Medical History  Diagnosis Date  . Diabetes mellitus   . Hyperlipidemia   . Osteoarthritis   . Osteoporosis   . Hypertension   . Obesity   . Upper GI bleed     AV malformation/when anticoag  . Sacroiliac joint dysfunction 04/01/2012    Past Surgical History  Procedure Date  . Appendectomy   . Cholecystectomy   . Colon resection 1988    secondary to cancer  . Polypectomy 1998    benign X 2  . Cervicitis 1964    conization   . Total knee arthroplasty 07/2009    right   . Endometrial polyp 05/2000    hyperplasia-laser treatment    Family History  Problem Relation Age of Onset  . Stroke Father   . Cancer Brother     lung    History  Substance Use Topics  . Smoking status: Former Smoker    Quit date: 08/13/2000  . Smokeless tobacco: Never Used     Comment: Quit over 10 years ago  . Alcohol Use: No    OB History    Grav Para Term Preterm Abortions TAB SAB Ect  Mult Living                  Review of Systems  All other systems reviewed and are negative.    Allergies  Diclofenac sodium and Fluoxetine hcl  Home Medications   Current Outpatient Rx  Name  Route  Sig  Dispense  Refill  . VITAMIN C 500 MG PO TABS   Oral   Take 500 mg by mouth daily.           . ASPIRIN 325 MG PO TBEC   Oral   Take 325 mg by mouth daily.           . ATORVASTATIN CALCIUM 10 MG PO TABS   Oral   Take 10 mg by mouth daily.         Marland Kitchen BISACODYL 10 MG RE SUPP   Rectal   Place 10 mg rectally daily as needed. For constipation         . CALCIUM CARBONATE 600 MG PO TABS   Oral   Take 600 mg by mouth 2 (two) times daily with a meal.           . CYCLOBENZAPRINE HCL 10 MG PO TABS   Oral   Take 1 tablet (10 mg  total) by mouth at bedtime as needed for muscle spasms.   30 tablet   0   . FERROUS SULFATE 325 (65 FE) MG PO TABS   Oral   Take 325 mg by mouth daily with breakfast.         . IMIPRAMINE HCL 50 MG PO TABS   Oral   Take 100 mg by mouth at bedtime.         Marland Kitchen LISINOPRIL 10 MG PO TABS   Oral   Take 10 mg by mouth daily.         . MULTIVITAMINS PO CAPS   Oral   Take 1 capsule by mouth daily.           . OMEGA-3 FATTY ACIDS 300 MG PO CAPS   Oral   Take 1 capsule by mouth 3 (three) times daily.           Marland Kitchen OMEPRAZOLE 20 MG PO CPDR   Oral   Take 20 mg by mouth daily.         . OXYCODONE-ACETAMINOPHEN 5-325 MG PO TABS   Oral   Take 1 tablet by mouth every 6 (six) hours as needed for pain.   15 tablet   0     BP 164/62  Pulse 98  Temp 97.9 F (36.6 C) (Oral)  Resp 20  SpO2 98%  Physical Exam  Nursing note and vitals reviewed. Constitutional: She is oriented to person, place, and time. She appears well-developed and well-nourished.  HENT:  Head: Normocephalic and atraumatic.  Eyes: Conjunctivae normal and EOM are normal. Pupils are equal, round, and reactive to light.  Neck: Normal range of motion and  phonation normal. Neck supple.  Cardiovascular: Normal rate, regular rhythm and intact distal pulses.   Pulmonary/Chest: Effort normal and breath sounds normal. She exhibits no tenderness.  Abdominal: Soft. She exhibits no distension. There is no tenderness. There is no guarding.       Non-tender, non-reducible, left mid abdominal hernia  Genitourinary:       Anus with normal sphincter tone. No external lesions. Small amount of brown stool in the rectal vault. No fecal impaction or rectal mass.  Musculoskeletal: Normal range of motion.  Neurological: She is alert and oriented to person, place, and time. She has normal strength. She exhibits normal muscle tone.  Skin: Skin is warm and dry.  Psychiatric: She has a normal mood and affect. Her behavior is normal. Judgment and thought content normal.    ED Course  Procedures (including critical care time)   Emergency department treatment: IV fluids, IV, Rocephin     Labs Reviewed  CBC - Abnormal; Notable for the following:    WBC 16.6 (*)     All other components within normal limits  BASIC METABOLIC PANEL - Abnormal; Notable for the following:    Sodium 130 (*)     Chloride 91 (*)     Glucose, Bld 182 (*)     BUN 38 (*)     Creatinine, Ser 1.69 (*)     GFR calc non Af Amer 27 (*)     GFR calc Af Amer 32 (*)     All other components within normal limits  URINALYSIS, ROUTINE W REFLEX MICROSCOPIC - Abnormal; Notable for the following:    Color, Urine AMBER (*)  BIOCHEMICALS MAY BE AFFECTED BY COLOR   APPearance CLOUDY (*)     Bilirubin Urine SMALL (*)     Leukocytes, UA MODERATE (*)  All other components within normal limits  OCCULT BLOOD, POC DEVICE - Abnormal; Notable for the following:    Fecal Occult Bld POSITIVE (*)     All other components within normal limits  URINE MICROSCOPIC-ADD ON - Abnormal; Notable for the following:    Bacteria, UA MANY (*)     Casts GRANULAR CAST (*)  HYALINE CASTS   All other components  within normal limits  POCT I-STAT TROPONIN I  OCCULT BLOOD, POC DEVICE  URINE CULTURE   No results found.   1. Weakness   2. UTI (lower urinary tract infection)   3. Azotemia   4. Renal insufficiency   5. Hyperglycemia       MDM  Weakness, secondary to urinary tract infection. Symptoms are persistent, and worsening. Patient is to be admitted for stabilization. She has a small amount of incidental, rectal bleeding to it may be associated with her constipation. She is worsening renal function from her baseline prior to this month. This indicates dehydration.        Richarda Blade, MD 04/07/12 2158

## 2012-04-07 NOTE — Patient Instructions (Addendum)
We are sending you to the ER presently for weakness/ dehydration

## 2012-04-07 NOTE — Assessment & Plan Note (Signed)
From pain medication for back - may be impacted- 1 bm in 1 week- here in office , small Also vomited times one today  Sending pt to ER for hydration and stabilization

## 2012-04-07 NOTE — ED Notes (Signed)
Patient transported to X-ray 

## 2012-04-07 NOTE — ED Notes (Signed)
Patient presents with c/o dizziness x 6 days. Went to PCP today; blood & urine testing completed, was told that she had a UTI. Also has not had a BM x 1 week. Daughter gave suppository today with no results. Also c/o decreased appetite and left arm ache

## 2012-04-08 ENCOUNTER — Encounter (HOSPITAL_COMMUNITY): Payer: Self-pay | Admitting: General Practice

## 2012-04-08 DIAGNOSIS — E871 Hypo-osmolality and hyponatremia: Secondary | ICD-10-CM

## 2012-04-08 DIAGNOSIS — M545 Low back pain: Secondary | ICD-10-CM

## 2012-04-08 DIAGNOSIS — N179 Acute kidney failure, unspecified: Secondary | ICD-10-CM

## 2012-04-08 DIAGNOSIS — N390 Urinary tract infection, site not specified: Secondary | ICD-10-CM | POA: Diagnosis not present

## 2012-04-08 DIAGNOSIS — R531 Weakness: Secondary | ICD-10-CM

## 2012-04-08 DIAGNOSIS — R5381 Other malaise: Secondary | ICD-10-CM | POA: Diagnosis not present

## 2012-04-08 LAB — BASIC METABOLIC PANEL
Chloride: 95 mEq/L — ABNORMAL LOW (ref 96–112)
GFR calc Af Amer: 54 mL/min — ABNORMAL LOW (ref >90)
GFR calc non Af Amer: 47 mL/min — ABNORMAL LOW (ref >90)
Potassium: 4.5 mEq/L (ref 3.5–5.1)
Sodium: 129 mEq/L — ABNORMAL LOW (ref 135–145)

## 2012-04-08 LAB — GLUCOSE, CAPILLARY
Glucose-Capillary: 127 mg/dL — ABNORMAL HIGH (ref 70–99)
Glucose-Capillary: 131 mg/dL — ABNORMAL HIGH (ref 70–99)
Glucose-Capillary: 103 mg/dL — ABNORMAL HIGH (ref 70–99)

## 2012-04-08 LAB — TROPONIN I
Troponin I: <0.3 ng/mL (ref <0.30)
Troponin I: <0.3 ng/mL (ref <0.30)

## 2012-04-08 LAB — HEMOGLOBIN A1C
Hgb A1c MFr Bld: 6.8 % — ABNORMAL HIGH (ref <5.7)
Mean Plasma Glucose: 148 mg/dL — ABNORMAL HIGH (ref <117)

## 2012-04-08 LAB — CBC
Hemoglobin: 13.2 g/dL (ref 12.0–15.0)
MCHC: 33.9 g/dL (ref 30.0–36.0)
RBC: 4.73 MIL/uL (ref 3.87–5.11)
WBC: 17.1 10*3/uL — ABNORMAL HIGH (ref 4.0–10.5)

## 2012-04-08 LAB — SODIUM, URINE, RANDOM: Sodium, Ur: 19 mEq/L

## 2012-04-08 LAB — OSMOLALITY, URINE: Osmolality, Ur: 578 mOsm/kg (ref 390–1090)

## 2012-04-08 LAB — TSH: TSH: 0.629 u[IU]/mL (ref 0.350–4.500)

## 2012-04-08 LAB — CLOSTRIDIUM DIFFICILE BY PCR: Toxigenic C. Difficile by PCR: NEGATIVE

## 2012-04-08 MED ORDER — HYDRALAZINE HCL 20 MG/ML IJ SOLN
10.0000 mg | Freq: Four times a day (QID) | INTRAMUSCULAR | Status: DC | PRN
  Administered 2012-04-09: 10 mg via INTRAVENOUS
  Filled 2012-04-08 (×2): qty 0.5

## 2012-04-08 MED ORDER — CALCIUM CARBONATE ANTACID 500 MG PO CHEW
1.0000 | CHEWABLE_TABLET | Freq: Once | ORAL | Status: AC
  Filled 2012-04-08: qty 1

## 2012-04-08 MED ORDER — DOCUSATE SODIUM 100 MG PO CAPS
100.0000 mg | ORAL_CAPSULE | Freq: Two times a day (BID) | ORAL | Status: DC
  Administered 2012-04-08 – 2012-04-10 (×4): 100 mg via ORAL
  Filled 2012-04-08 (×6): qty 1

## 2012-04-08 MED ORDER — POLYETHYLENE GLYCOL 3350 17 G PO PACK
17.0000 g | PACK | Freq: Every day | ORAL | Status: DC
  Administered 2012-04-10: 17 g via ORAL
  Filled 2012-04-08 (×3): qty 1

## 2012-04-08 MED ORDER — CALCIUM CARBONATE ANTACID 500 MG PO CHEW
1.0000 | CHEWABLE_TABLET | Freq: Three times a day (TID) | ORAL | Status: DC | PRN
  Administered 2012-04-08 (×2): 200 mg via ORAL
  Filled 2012-04-08: qty 1

## 2012-04-08 NOTE — Progress Notes (Signed)
Pt complained of something for her heart burn- called doctor- doctor ordered tums. Pt is now resting with no further complaints.

## 2012-04-08 NOTE — Progress Notes (Signed)
[facility] progress note. Chief complaint. Chest pain. History of present illness. This 78 year old female in hospital with UTI and dizziness. Her history also includes diabetes, hypertension, and obesity. I see no cardiac history listed. Patient complained to nursing of chest pain this a.m. She describes the pain as a burning sensation at the area of the sternum. There is no radiation of the pain. There is no diaphoresis but the patient does describe some nausea. Patient states she believes that this is probably heartburn. Nonetheless I did come to see the patient at the bedside. A 12-lead EKG was obtained and this does not appear different from earlier EKG and I see no suggestion of acute ischemia. The patient states that the pain has improved since onset. Vital signs. Temperature 98.9, pulse 90, respiration 20, blood pressure 191/82. O2 sats 98%. General appearance. Obese elderly female who is alert, pleasant, and in no distress. Cardiac. Rate and rhythm regular. No jugular venous distention or significant edema. Lungs. Breath sounds are clear and equal. Abdomen. Soft with positive bowel sounds. No pain with palpation. Musculoskeletal. There is no pain with palpation over the sternal area. Impression/plan. Problem #1. Chest pain. I suspect this is probably not cardiac in more likely GI with reflux symptoms. 12-lead EKG does not appear changed and does not look concerning for acute ischemia. Nonetheless I will repeat a 12-lead EKG in about 2 hours to evaluate for any changes. I will cycle 3 sets of troponin now and then every 6 hours. I will provide the patient with a antacid in hopes this helps her pain. Problem #2. Hypertension. Patient's systolic blood pressure reported in the 190 range. I will place the patient on hydralazine 10 mg every 6 hours as needed for systolic greater than 0000000 or diastolic greater than [id number].

## 2012-04-08 NOTE — Evaluation (Signed)
Physical Therapy Evaluation Patient Details Name: Erin [name] MRN: [id number] DOB: Feb 22, 1929 Today's Date: 04/08/2012 Time: [id number] PT Time Calculation (min): 25 min  PT Assessment / Plan / Recommendation Clinical Impression  Patient is an 78 yo female admitted with UTI, dehydration, and dizziness.  Patient with general weakness impacting mobility.  Patient will benefit from acute PT to maximize independence prior to return home.  Recommend HHPT at discharge.    PT Assessment  Patient needs continued PT services    Follow Up Recommendations  Home health PT;Supervision/Assistance - 24 hour    Does the patient have the potential to tolerate intense rehabilitation      Barriers to Discharge        Equipment Recommendations  None recommended by PT    Recommendations for Other Services     Frequency Min 3X/week    Precautions / Restrictions Precautions Precautions: Fall Restrictions Weight Bearing Restrictions: No   Pertinent Vitals/Pain       Mobility  Bed Mobility Bed Mobility: Supine to Sit;Sitting - Scoot to Edge of Bed;Sit to Supine Supine to Sit: 4: Min guard;With rails;HOB elevated Sitting - Scoot to Edge of Bed: 4: Min assist;With rail Sit to Supine: 4: Min assist;With rail;HOB elevated Details for Bed Mobility Assistance: Verbal cues for technique.  Assist using bed pad to scoot to EOB.  Assist to raise LE's onto bed when returning to supine. Transfers Transfers: Sit to Stand;Stand to Sit Sit to Stand: 3: Mod assist;From elevated surface;With upper extremity assist;From bed Stand to Sit: 4: Min assist;With upper extremity assist;To bed Details for Transfer Assistance: Verbal cues for hand placement and to scoot to EOB before standing.  Elevated bed to assist with rise to standing.  Patient required mod assist to come to standing - physical assist and for balance/safety.  Cues to extend knees and stand upright.  Patient with flexed posture in standing at knees,  hips, and trunk. Ambulation/Gait Ambulation/Gait Assistance: 4: Min assist Ambulation Distance (Feet): 20 Feet Assistive device: Rolling walker Ambulation/Gait Assistance Details: Verbal cues to stand upright, and keep RW close to body for safety.  Patient continues with flexed posture during gait. Gait Pattern: Step-through pattern;Decreased stride length;Shuffle;Trunk flexed;Right flexed knee in stance;Left flexed knee in stance Gait velocity: Slow gait speed           PT Diagnosis: Difficulty walking;Abnormality of gait;Generalized weakness  PT Problem List: Decreased strength;Decreased activity tolerance;Decreased balance;Decreased mobility;Decreased knowledge of use of DME PT Treatment Interventions: DME instruction;Gait training;Functional mobility training;Patient/family education   PT Goals Acute Rehab PT Goals PT Goal Formulation: With patient Time For Goal Achievement: 04/15/12 Potential to Achieve Goals: Good Pt will go Sit to Supine/Side: Independently;with HOB 0 degrees PT Goal: Sit to Supine/Side - Progress: Goal set today Pt will go Sit to Stand: with modified independence;with upper extremity assist PT Goal: Sit to Stand - Progress: Goal set today Pt will Ambulate: 51 - 150 feet;with modified independence;with rolling walker PT Goal: Ambulate - Progress: Goal set today  Visit Information  Last PT Received On: 04/08/12 Assistance Needed: +2    Subjective Data  Subjective: "I let myself get dehydrated, and now I'm here" Patient Stated Goal: To return home.   Prior Functioning  Home Living Lives With: Son Available Help at Discharge: Family;Available 24 hours/day Type of Home: Mobile home Home Access: Ramped entrance Home Layout: One level Bathroom Shower/Tub: Chiropodist: Standard Bathroom Accessibility: Yes How Accessible: Accessible via walker [place]: Bedside commode/3-in-1;Walker -  rolling;Walker - four  wheeled;Wheelchair - Metallurgist Comments: Son is special needs, and is able to assist patient with meals and housekeeping. Prior Function Level of Independence: Independent with assistive device(s);Needs assistance (uses cane for ambulation) Needs Assistance: Meal Prep;Light Housekeeping Meal Prep: Moderate Light Housekeeping: Moderate Able to Take Stairs?: No Driving: Yes Vocation: Retired Corporate investment banker: No difficulties Dominant Hand: Right    Cognition  Overall Cognitive Status: Appears within functional limits for tasks assessed/performed Arousal/Alertness: Awake/alert Orientation Level: Oriented X4 / Intact Behavior During Session: WFL for tasks performed    Extremity/Trunk Assessment Right Upper Extremity Assessment RUE ROM/Strength/Tone: [facility] for tasks assessed Left Upper Extremity Assessment LUE ROM/Strength/Tone: WFL for tasks assessed Right Lower Extremity Assessment RLE ROM/Strength/Tone: Deficits RLE ROM/Strength/Tone Deficits: General weakness - strength 4-/5 RLE Sensation: WFL - Light Touch Left Lower Extremity Assessment LLE ROM/Strength/Tone: Deficits LLE ROM/Strength/Tone Deficits: General weakness - strength 4-/5 LLE Sensation: WFL - Light Touch   Balance    End of Session PT - End of Session Equipment Utilized During Treatment: Gait belt Activity Tolerance: Patient limited by fatigue Patient left: in bed;with call bell/phone within reach Nurse Communication: Mobility status  GP     Despina Pole 04/08/2012, 4:25 PM Carita Pian. Sanjuana Kava, [place] Pager [phone]

## 2012-04-08 NOTE — Progress Notes (Signed)
[facility] PROGRESS NOTE  Erin [name] [id number] DOB: Jul 03, 1928 DOA: 04/07/2012 PCP: [name], MD  Assessment/Plan: 1. Escherichia coli UTI--continue Rocephin. Followup repeat culture. Leukocytosis without significant change. No other focal symptoms. 2. Acute renal failure--resolving with IV fluids. Secondary to poor oral intake at home, complicated by lisinopril (on hold).  3. Hyponatremia--no significant change. Likely secondary to dehydration. Serum osmolality normal. Continue IV fluids. ACE inhibitor on hold. 4. Anorexia--likely secondary to acute illness. Follow. 5. Generalized weakness, dizziness--likely secondary to dehydration, UTI. Physical therapy consult. Check orthostatics. 6. Indigestion/atypical chest--relieved with TUMS. Telemetry reassuring. Enzymes negative. EKG is reviewed, no significant change is seen. History most suggestive of indigestion. No further evaluation suggested at this time. Continue PPI. 7. Incidental rectal bleeding--was fecal occult blood positive. No evidence of ongoing bleeding. Hemoglobin stable. Followup with GI as an outpatient. 8. Chronic Low back and right leg sciatica pain x2 weeks--patient denies any back or leg pain at this time. No focal neurologic symptoms. She confirms she has chronic low back pain. 9. Constipation--relieved. Continue bowel regimen while on the topics. Likely secondary to narcotics as an outpatient. 10. Diabetes mellitus type 2, diet controlled, hemoglobin A1c 6.9--stable. Sliding-scale insulin. 11. Hypertension--monitor. Started on metoprolol as ACE inhibitor held. 12. Abdominal wall hernia--stable.  EKG 12/27--normal sinus rhythm. No acute changes. EKG 12/28--normal sinus rhythm. No acute changes.  Code Status: DO NOT RESUSCITATE Family Communication: None present Disposition Plan: Home when improved, possibly 12/29  Murray Hodgkins, MD  [facility] Team 4  Pager [phone] If 7PM-7AM, please contact  night-coverage at [email or web address], password [facility] 04/08/2012, 9:11 AM  LOS: 1 day   Brief narrative: 78 year old woman presented with a six-day history of dizziness exacerbated by standing as well as exacerbation of chronic back pain. Admitted for UTI and dehydration, acute renal failure.  Consultants:  None  Procedures:  None  Antibiotics:  Ceftriaxone 12/27 >>  HPI/Subjective: Complained of heartburn overnight. Also complained of chest pain. Patient believed it was reflux. TUMS improved symptoms. No recurrence. Denies back pain. No sacral dysesthesia. No bowel or bladder incontinence. No lower extremity weakness.  Objective: Filed Vitals:   04/08/12 0013 04/08/12 0500 04/08/12 0550 04/08/12 0646  BP: 191/82   149/84  Pulse: 90   80  Temp: 98.9 F (37.2 C)   99.1 F (37.3 C)  TempSrc: Oral   Oral  Resp: 20   20  Height: 5' 10.5" (1.791 m)     Weight: 103.6 kg (228 lb 6.3 oz) 104 kg (229 lb 4.5 oz) 104 kg (229 lb 4.5 oz)   SpO2: 98%   98%   No intake or output data in the 24 hours ending 04/08/12 0911 Filed Weights   04/08/12 0013 04/08/12 0500 04/08/12 0550  Weight: 103.6 kg (228 lb 6.3 oz) 104 kg (229 lb 4.5 oz) 104 kg (229 lb 4.5 oz)    Exam:  General:  Appears calm and comfortable Cardiovascular: RRR, no m/r/g. No LE edema. Telemetry: SR, no arrhythmias  Respiratory: CTA bilaterally, no w/r/r. Normal respiratory effort. Abdomen: soft, ntnd, hernia nontender. Musculoskeletal: grossly normal tone/strength BLE, lift both legs off the bed. Sensation grossly intact lower extremities. Psychiatric: grossly normal mood and affect, speech fluent and appropriate Neurologic: grossly non-focal.  Data Reviewed: Basic Metabolic Panel:  Lab [id number] 04/07/12 1602 04/04/12 1202  NA 129* 130* 135  K 4.5 4.1 4.4  CL 95* 91* 98  CO2 23 22 23   GLUCOSE 132* 182* 131*  BUN 32* 38* 25*  CREATININE 1.08 1.69* 1.40*  CALCIUM 9.5 10.1 9.9  MG -- -- --  PHOS -- -- --    CBC:  Lab 04/08/12 0854 04/07/12 1602 04/04/12 1202  WBC 17.1* 16.6* 12.5*  NEUTROABS -- -- 8.2*  HGB 13.2 14.2 13.1  HCT 38.9 42.8 40.9  MCV 82.2 83.9 85.2  PLT 189 230 191   Cardiac Enzymes:  Lab 04/04/12 1202  CKTOTAL --  CKMB --  CKMBINDEX --  TROPONINI <0.30   CBG:  Lab 04/08/12 0735  GLUCAP 127*    Recent Results (from the past 240 hour(s))  URINE CULTURE     Status: Normal   Collection Time   04/04/12  2:36 PM      Component Value Range Status Comment   Specimen Description URINE, CLEAN CATCH   Final    Special Requests NONE   Final    Culture  Setup Time 04/04/2012 15:09   Final    Colony Count 60,000 COLONIES/ML   Final    Culture ESCHERICHIA COLI   Final    Report Status 04/06/2012 FINAL   Final    Organism ID, Bacteria ESCHERICHIA COLI   Final      Studies: Dg Abd Acute W/chest  04/07/2012  *RADIOLOGY REPORT*  Clinical Data: Hernia.  Urinary tract infection  ACUTE ABDOMEN SERIES (ABDOMEN 2 VIEW & CHEST 1 VIEW)  Comparison: 04/04/2012  Findings: Normal heart size.  No pleural effusion or edema. Biapical scarring is similar to previous exam.  No superimposed airspace consolidation.  Moderate stool burden identified within the colon.  No dilated loops of small bowel or fluid levels identified.  IMPRESSION:  1.  Nonobstructive bowel gas pattern. 2.  No active cardiopulmonary abnormalities.   Original Report Authenticated By: Kerby Moors, M.D.     Scheduled Meds:   . aspirin  325 mg Oral Daily  . atorvastatin  10 mg Oral Daily  . calcium carbonate  1,250 mg Oral BID WC  . cefTRIAXone (ROCEPHIN) IVPB 1 gram/50 mL D5W  1 g Intravenous QHS  . ferrous sulfate  325 mg Oral Q breakfast  . imipramine  100 mg Oral QHS  . insulin aspart  0-9 Units Subcutaneous TID WC  . metoprolol tartrate  50 mg Oral BID  . pantoprazole  40 mg Oral Daily  . sodium chloride  3 mL Intravenous Q12H  . vitamin C  500 mg Oral Daily   Continuous Infusions:   . sodium chloride  75 mL/hr at 04/07/12 2303    Principal Problem:  *UTI (lower urinary tract infection) Active Problems:  HYPERLIPIDEMIA  OBESITY  HYPERTENSION, ESSENTIAL NOS  ABDOMINAL WALL HERNIA  DM2 (diabetes mellitus, type 2)  Sciatica  Sacroiliac joint dysfunction  Constipation  Colon cancer H/O  Acute renal failure  Hyponatremia  Generalized weakness     Murray Hodgkins, MD  [facility] Team 4  Pager [phone] If 7PM-7AM, please contact night-coverage at [email or web address], password [facility] 04/08/2012, 9:11 AM  LOS: 1 day   Time spent: 20 minutes

## 2012-04-08 NOTE — Progress Notes (Signed)
  Pt admitted to the unit. Pt is stable, alert and oriented per baseline. Oriented to room, staff, and call bell. Educated to call for any assistance. Bed in lowest position, call bell within reach- will continue to monitor. 

## 2012-04-08 NOTE — Progress Notes (Signed)
Pt complained of chest pains. NP had stat EKG- showed normal reading. More blood work ordered. Pt says that it is probably her acid reflux. Tums was ordered to help. Will continue to monitor.

## 2012-04-09 DIAGNOSIS — N390 Urinary tract infection, site not specified: Secondary | ICD-10-CM | POA: Diagnosis not present

## 2012-04-09 DIAGNOSIS — R5383 Other fatigue: Secondary | ICD-10-CM | POA: Diagnosis not present

## 2012-04-09 DIAGNOSIS — N179 Acute kidney failure, unspecified: Secondary | ICD-10-CM | POA: Diagnosis not present

## 2012-04-09 DIAGNOSIS — E871 Hypo-osmolality and hyponatremia: Secondary | ICD-10-CM | POA: Diagnosis not present

## 2012-04-09 LAB — BASIC METABOLIC PANEL
BUN: 24 mg/dL — ABNORMAL HIGH (ref 6–23)
Chloride: 102 mEq/L (ref 96–112)
GFR calc Af Amer: 58 mL/min — ABNORMAL LOW (ref >90)
GFR calc non Af Amer: 50 mL/min — ABNORMAL LOW (ref >90)
Potassium: 4 mEq/L (ref 3.5–5.1)
Sodium: 134 mEq/L — ABNORMAL LOW (ref 135–145)

## 2012-04-09 LAB — CBC
Hemoglobin: 11.4 g/dL — ABNORMAL LOW (ref 12.0–15.0)
MCHC: 31.1 g/dL (ref 30.0–36.0)
RBC: 4.37 MIL/uL (ref 3.87–5.11)
WBC: 11.7 10*3/uL — ABNORMAL HIGH (ref 4.0–10.5)

## 2012-04-09 LAB — URINE CULTURE

## 2012-04-09 LAB — GLUCOSE, CAPILLARY
Glucose-Capillary: 125 mg/dL — ABNORMAL HIGH (ref 70–99)
Glucose-Capillary: 110 mg/dL — ABNORMAL HIGH (ref 70–99)

## 2012-04-09 NOTE — Progress Notes (Signed)
[facility] PROGRESS NOTE  Erin [name] [id number] DOB: January 07, 1929 DOA: 04/07/2012 PCP: [name], MD  Assessment/Plan: 1. Escherichia coli UTI--continue Rocephin. Followup repeat culture. Leukocytosis significantly decreased.  2. Acute renal failure--nearly resolved with IV fluids. Secondary to poor oral intake at home, complicated by lisinopril (on hold).  3. Hyponatremia--nearly resolved. Likely secondary to dehydration. Serum osmolality normal. Continue IV fluids. ACE inhibitor on hold. 4. Anorexia--likely secondary to acute illness. Follow. 5. Generalized weakness, dizziness--likely secondary to dehydration, UTI. Physical therapy consult noted. Orthostatic hypotension appears resolved. 6. Indigestion/atypical chest--relieved with TUMS. Telemetry reassuring. Enzymes negative. EKG is reviewed, no significant change is seen. History most suggestive of indigestion. No further evaluation suggested at this time. Continue PPI. 7. Incidental rectal bleeding--was fecal occult blood positive. No evidence of ongoing bleeding. Repeat hemoglobin in the morning. Noted anemia 12/2011 and 06/2011. 8. Chronic Low back and right leg sciatica pain x2 weeks--patient denies significant back or leg pain at this time. No focal neurologic symptoms. She confirms she has chronic low back pain. 9. Constipation--relieved. Continue bowel regimen while on narcotics. Likely secondary to narcotics as an outpatient. 10. Diabetes mellitus type 2, diet controlled, hemoglobin A1c 6.9--stable. Sliding-scale insulin. 11. Hypertension--monitor. Started on metoprolol as ACE inhibitor held. 12. Abdominal wall hernia--stable.   Code Status: DO NOT RESUSCITATE Family Communication: None present Disposition Plan: Home when improved, possibly 12/29  Murray Hodgkins, MD  [facility] Team 4  Pager [phone] If 7PM-7AM, please contact night-coverage at [email or web address], password [facility] 04/09/2012, 12:55 PM  LOS: 76 days     Brief narrative: 78 year old woman presented with a six-day history of dizziness exacerbated by standing as well as exacerbation of chronic back pain. Admitted for UTI and dehydration, acute renal failure.  Consultants:  Physical therapy: Home health PT. No equipment recommended.  Procedures:  None  Antibiotics:  Ceftriaxone 12/27 >>  HPI/Subjective: Feels better today. The dizziness seems to be gone. Was able to stand up and wash.  Objective: Filed Vitals:   04/09/12 0542 04/09/12 0843 04/09/12 0845 04/09/12 0847  BP: 172/82 174/70 136/85 164/70  Pulse: 78 80 84 89  Temp: 98.1 F (36.7 C)     TempSrc: Oral     Resp: 18     Height:      Weight:      SpO2: 100%       Intake/Output Summary (Last 24 hours) at 04/09/12 1255 Last data filed at 04/09/12 1105  Gross per 24 hour  Intake 3674.58 ml  Output    650 ml  Net 3024.58 ml   Filed Weights   04/08/12 0500 04/08/12 0550 04/09/12 0500  Weight: 104 kg (229 lb 4.5 oz) 104 kg (229 lb 4.5 oz) 104.2 kg (229 lb 11.5 oz)    Exam:  General:  Appears calm and comfortable Cardiovascular: RRR, no m/r/g. No LE edema. Telemetry: Sinus rhythm, no arrhythmias. Respiratory: CTA bilaterally, no w/r/r. Normal respiratory effort. Psychiatric: grossly normal mood and affect, speech fluent and appropriate  Data Reviewed: Basic Metabolic Panel:  Lab [id number] 04/08/12 0854 04/07/12 1602 04/04/12 1202  NA 134* 129* 130* 135  K 4.0 4.5 4.1 4.4  CL 102 95* 91* 98  CO2 22 23 22 23   GLUCOSE 115* 132* 182* 131*  BUN 24* 32* 38* 25*  CREATININE 1.02 1.08 1.69* 1.40*  CALCIUM 9.4 9.5 10.1 9.9  MG -- -- -- --  PHOS -- -- -- --   CBC:  Lab 04/09/12 0755 04/08/12 0854 04/07/12 1602 04/04/12  1202  WBC 11.7* 17.1* 16.6* 12.5*  NEUTROABS -- -- -- 8.2*  HGB 11.4* 13.2 14.2 13.1  HCT 36.7 38.9 42.8 40.9  MCV 84.0 82.2 83.9 85.2  PLT 169 189 230 191   Cardiac Enzymes:  Lab 04/08/12 1706 04/08/12 1104 04/04/12 1202   CKTOTAL -- -- --  CKMB -- -- --  CKMBINDEX -- -- --  TROPONINI <0.30 <0.30 <0.30   CBG:  Lab 04/09/12 1200 04/09/12 0755 04/08/12 2142 04/08/12 1733 04/08/12 0735  GLUCAP 113* 125* 103* 131* 127*    Recent Results (from the past 240 hour(s))  URINE CULTURE     Status: Normal   Collection Time   04/04/12  2:36 PM      Component Value Range Status Comment   Specimen Description URINE, CLEAN CATCH   Final    Special Requests NONE   Final    Culture  Setup Time 04/04/2012 15:09   Final    Colony Count 60,000 COLONIES/ML   Final    Culture ESCHERICHIA COLI   Final    Report Status 04/06/2012 FINAL   Final    Organism ID, Bacteria ESCHERICHIA COLI   Final   CLOSTRIDIUM DIFFICILE BY PCR     Status: Normal   Collection Time   04/08/12  2:52 PM      Component Value Range Status Comment   C difficile by pcr NEGATIVE  NEGATIVE Final      Studies: Dg Abd Acute W/chest  04/07/2012  *RADIOLOGY REPORT*  Clinical Data: Hernia.  Urinary tract infection  ACUTE ABDOMEN SERIES (ABDOMEN 2 VIEW & CHEST 1 VIEW)  Comparison: 04/04/2012  Findings: Normal heart size.  No pleural effusion or edema. Biapical scarring is similar to previous exam.  No superimposed airspace consolidation.  Moderate stool burden identified within the colon.  No dilated loops of small bowel or fluid levels identified.  IMPRESSION:  1.  Nonobstructive bowel gas pattern. 2.  No active cardiopulmonary abnormalities.   Original Report Authenticated By: Kerby Moors, M.D.     Scheduled Meds:    . aspirin  325 mg Oral Daily  . atorvastatin  10 mg Oral Daily  . calcium carbonate  1,250 mg Oral BID WC  . cefTRIAXone (ROCEPHIN) IVPB 1 gram/50 mL D5W  1 g Intravenous QHS  . docusate sodium  100 mg Oral BID  . ferrous sulfate  325 mg Oral Q breakfast  . imipramine  100 mg Oral QHS  . insulin aspart  0-9 Units Subcutaneous TID WC  . metoprolol tartrate  50 mg Oral BID  . pantoprazole  40 mg Oral Daily  . polyethylene glycol   17 g Oral Daily  . sodium chloride  3 mL Intravenous Q12H  . vitamin C  500 mg Oral Daily   Continuous Infusions:    . sodium chloride 125 mL/hr at 04/09/12 1200    Principal Problem:  *UTI (lower urinary tract infection) Active Problems:  HYPERLIPIDEMIA  OBESITY  HYPERTENSION, ESSENTIAL NOS  ABDOMINAL WALL HERNIA  DM2 (diabetes mellitus, type 2)  Sciatica  Sacroiliac joint dysfunction  Constipation  Colon cancer H/O  Acute renal failure  Hyponatremia  Generalized weakness     Murray Hodgkins, MD  [facility] Team 4  Pager [phone] If 7PM-7AM, please contact night-coverage at [email or web address], password [facility] 04/09/2012, 12:55 PM  LOS: 76 days   Time spent: 20 minutes

## 2012-04-09 NOTE — ED Notes (Signed)
+   urine culture, pt admitted 12/27, receiving Rocephin one gram IVPB q 24 hr, sensitive to same

## 2012-04-10 DIAGNOSIS — N179 Acute kidney failure, unspecified: Secondary | ICD-10-CM | POA: Diagnosis not present

## 2012-04-10 DIAGNOSIS — N390 Urinary tract infection, site not specified: Secondary | ICD-10-CM | POA: Diagnosis not present

## 2012-04-10 DIAGNOSIS — E119 Type 2 diabetes mellitus without complications: Secondary | ICD-10-CM | POA: Diagnosis not present

## 2012-04-10 DIAGNOSIS — R5381 Other malaise: Secondary | ICD-10-CM | POA: Diagnosis not present

## 2012-04-10 LAB — CBC
MCH: 27 pg (ref 26.0–34.0)
MCHC: 32.4 g/dL (ref 30.0–36.0)
Platelets: 182 10*3/uL (ref 150–400)
RBC: 4.55 MIL/uL (ref 3.87–5.11)

## 2012-04-10 LAB — BASIC METABOLIC PANEL
BUN: 19 mg/dL (ref 6–23)
Calcium: 9.5 mg/dL (ref 8.4–10.5)
GFR calc Af Amer: 58 mL/min — ABNORMAL LOW (ref >90)
GFR calc non Af Amer: 50 mL/min — ABNORMAL LOW (ref >90)
Glucose, Bld: 121 mg/dL — ABNORMAL HIGH (ref 70–99)
Sodium: 136 mEq/L (ref 135–145)

## 2012-04-10 MED ORDER — METOPROLOL TARTRATE 50 MG PO TABS: 50.0000 mg | ORAL_TABLET | Freq: Two times a day (BID) | ORAL | Status: DC

## 2012-04-10 MED ORDER — DSS 100 MG PO CAPS: 100.0000 mg | ORAL_CAPSULE | Freq: Two times a day (BID) | ORAL | Status: DC | PRN

## 2012-04-10 MED ORDER — CIPROFLOXACIN HCL 250 MG PO TABS: 250.0000 mg | ORAL_TABLET | Freq: Two times a day (BID) | ORAL | Status: DC

## 2012-04-10 MED ORDER — SULFAMETHOXAZOLE-TMP DS 800-160 MG PO TABS
1.0000 | ORAL_TABLET | Freq: Two times a day (BID) | ORAL | Status: DC
  Administered 2012-04-10: 1 via ORAL
  Filled 2012-04-10: qty 1

## 2012-04-10 MED ORDER — SULFAMETHOXAZOLE-TMP DS 800-160 MG PO TABS: 1.0000 | ORAL_TABLET | Freq: Two times a day (BID) | ORAL | Status: DC

## 2012-04-10 NOTE — Progress Notes (Signed)
Erin [name] to be D/C'd Home per MD order.  Discussed with the patient and all questions fully answered.   Re[place], [place]  Home Medication Instructions [id number]   Printed on:04/10/12 1957  Medication Information                    calcium carbonate (OS-CAL) 600 MG TABS Take 600 mg by mouth 2 (two) times daily with a meal.             aspirin 325 MG EC tablet Take 325 mg by mouth daily.             Multiple Vitamin (MULTIVITAMIN) capsule Take 1 capsule by mouth daily.             Omega-3 Fatty Acids 300 MG CAPS Take 1 capsule by mouth 3 (three) times daily.             Ascorbic Acid (VITAMIN C) 500 MG tablet Take 500 mg by mouth daily.             cyclobenzaprine (FLEXERIL) 10 MG tablet Take 1 tablet (10 mg total) by mouth at bedtime as needed for muscle spasms.           oxyCODONE-acetaminophen (PERCOCET/ROXICET) 5-325 MG per tablet Take 1 tablet by mouth every 6 (six) hours as needed for pain.           bisacodyl (LAXATIVE) 10 MG suppository Place 10 mg rectally daily as needed. For constipation           atorvastatin (LIPITOR) 10 MG tablet Take 10 mg by mouth daily.           ferrous sulfate 325 (65 FE) MG tablet Take 325 mg by mouth daily with breakfast.           imipramine (TOFRANIL) 50 MG tablet Take 100 mg by mouth at bedtime.           omeprazole (PRILOSEC) 20 MG capsule Take 20 mg by mouth daily.           docusate sodium 100 MG CAPS Take 100 mg by mouth 2 (two) times daily as needed for constipation.           metoprolol (LOPRESSOR) 50 MG tablet Take 1 tablet (50 mg total) by mouth 2 (two) times daily.           sulfamethoxazole-trimethoprim (BACTRIM DS) 800-160 MG per tablet Take 1 tablet by mouth every 12 (twelve) hours. Start 12/31 in the morning             VVS, Skin clean, dry and intact without evidence of skin break down, no evidence of skin tears noted. IV catheter discontinued intact. Site without signs and symptoms of complications.  Dressing and pressure applied.  An After Visit Summary was printed and given to the patient. Follow up appointments , new prescriptions and medication administration times given Patient escorted via [place], and D/C home via private auto.  Park Breed, RN 04/10/2012 7:57 PM

## 2012-04-10 NOTE — Discharge Summary (Signed)
Physician Discharge Summary  Erin [name] [id number] DOB: Feb 12, 1929 DOA: 04/07/2012  PCP: Erin Pardon, MD  Admit date: 04/07/2012 Discharge date: 04/10/2012  Recommendations for Outpatient Follow-up:  1. Resolution of bladder infection 2. Home health physical therapy 3. Hypertension--see below. 4. Fecal occult blood positive. Consider outpatient evaluation.  Follow-up Information    Follow up with Erin Pardon, MD. In 1 week.   Contact information:   [address] [place] [address] [phone]         Discharge Diagnoses:  1. Escherichia coli UTI 2. Acute renal failure    3. Hyponatremia 4. Anorexia 5. Generalized weakness, dizziness 6. Indigestion/atypical chest 7. Incidental rectal bleeding 8. Chronic Low back and right leg sciatica pain x2 weeks 9. Constipation 10. Diabetes mellitus type 2, diet controlled, hemoglobin A1c 6.9 11. Hypertension 12. Abdominal wall hernia  Discharge Condition: Improved Disposition: Home with home health physical therapy  Diet recommendation: Heart healthy, diabetic  Filed Weights   04/08/12 0550 04/09/12 0500 04/10/12 0526  Weight: 104 kg (229 lb 4.5 oz) 104.2 kg (229 lb 11.5 oz) 107.7 kg (237 lb 7 oz)    History of present illness:  78 year old woman presented with a six-day history of dizziness exacerbated by standing as well as exacerbation of chronic back pain. Admitted for UTI and dehydration, acute renal failure.  Hospital Course:  [name] was admitted and treated for UTI, acute renal failure and generalized weakness. She responded well to antibiotics we will complete a course of antibiotic therapy the outpatient setting. Her acute renal failure resolved with IV fluids and withholding of lisinopril. Most likely etiology is poor oral intake. Consideration could be given to restarting lisinopril as an outpatient. Hyponatremia resolved with treatment of dehydration. She had been suffering  from low back pain which is a chronic issue that seemed to have worsened on review of outpatient notes. However the patient had no pain during this hospitalization he has done well physical therapy. She continue to follow with her primary care physician in the outpatient setting for this. Other issues as outlined below.  1. Escherichia coli UTI--continues to improve. While blood cell count nearly normal. Repeat culture not revealing, not surprising as patient is on antibiotics. Discharge home on oral antibiotics.  2. Acute renal failure--resolved. Secondary to poor oral intake at home, complicated by lisinopril (on hold).    3. Hyponatremia--resolved. Likely secondary to dehydration. Serum osmolality normal.    4. Anorexia--improving, likely secondary to acute illness.   5. Generalized weakness, dizziness--likely secondary to dehydration, UTI. Physical therapy consult noted. Orthostatic hypotension appears resolved.  6. Indigestion/atypical chest--relieved with TUMS. Telemetry reassuring. Enzymes negative. EKG is reviewed, no significant change is seen. History most suggestive of indigestion. No further evaluation suggested at this time. Continue PPI.  7. Incidental rectal bleeding--was fecal occult blood positive. No evidence of ongoing bleeding. Repeat hemoglobin stable. Followup as outpatient. Noted anemia 12/2011 and 06/2011.  8. Chronic Low back and right leg sciatica pain x2 weeks--patient continues to deny significant back or leg pain at this time. No focal neurologic symptoms. She confirms she has chronic low back pain.  9. Constipation--relieved. Continue bowel regimen while on narcotics. Likely secondary to narcotics as an outpatient.  10. Diabetes mellitus type 2, diet controlled, hemoglobin A1c 6.9--stable. Sliding-scale insulin.  11. Hypertension--stable. Started on metoprolol as ACE inhibitor held.  12. Abdominal wall hernia--stable.  Consultants:  Physical therapy: Home health PT. No  equipment recommended.  Procedures:  None  Antibiotics:  Ceftriaxone 12/27 >> 12/29   Bactrim 12/30 >> 1/2    Discharge Instructions  Discharge Orders    Future Appointments: Provider: Department: Dept Phone: Center:   06/28/2012 10:00 AM [facility] [place]   07/05/2012 2:30 PM Abner Greenspan, MD [facility] [phone] [id number]     Future Orders Please Complete By Expires   Diet - low sodium heart healthy      Diet Carb Modified      Increase activity slowly      Discharge instructions      Comments:   You were treated for bladder infection. It is important to finish Bactrim for this infection. Your former blood pressure medication lisinopril should be discontinued at this point until you followup with your primary care physician. You were started on a new blood pressure medication (metoprolol). Increase your activity slowly, use a walker. Call physician for worsening condition, fever or pain.       Medication List     As of 04/10/2012  5:46 PM    STOP taking these medications         lisinopril 10 MG tablet   Commonly known as: PRINIVIL,ZESTRIL      TAKE these medications         aspirin 325 MG EC tablet   Take 325 mg by mouth daily.      atorvastatin 10 MG tablet   Commonly known as: LIPITOR   Take 10 mg by mouth daily.      calcium carbonate 600 MG Tabs   Commonly known as: OS-CAL   Take 600 mg by mouth 2 (two) times daily with a meal.      cyclobenzaprine 10 MG tablet   Commonly known as: FLEXERIL   Take 1 tablet (10 mg total) by mouth at bedtime as needed for muscle spasms.      DSS 100 MG Caps   Take 100 mg by mouth 2 (two) times daily as needed for constipation.      ferrous sulfate 325 (65 FE) MG tablet   Take 325 mg by mouth daily with breakfast.      imipramine 50 MG tablet   Commonly known as: TOFRANIL   Take 100 mg by mouth at bedtime.      LAXATIVE 10 MG  suppository   Generic drug: bisacodyl   Place 10 mg rectally daily as needed. For constipation      metoprolol 50 MG tablet   Commonly known as: LOPRESSOR   Take 1 tablet (50 mg total) by mouth 2 (two) times daily.      multivitamin capsule   Take 1 capsule by mouth daily.      Omega-3 Fatty Acids 300 MG Caps   Take 1 capsule by mouth 3 (three) times daily.      omeprazole 20 MG capsule   Commonly known as: PRILOSEC   Take 20 mg by mouth daily.      oxyCODONE-acetaminophen 5-325 MG per tablet   Commonly known as: PERCOCET/ROXICET   Take 1 tablet by mouth every 6 (six) hours as needed for pain.      sulfamethoxazole-trimethoprim 800-160 MG per tablet   Commonly known as: BACTRIM DS   Take 1 tablet by mouth every 12 (twelve) hours. Start 12/31 in the morning      vitamin C 500 MG tablet   Commonly known as: ASCORBIC ACID   Take 500 mg by  mouth daily.         The results of significant diagnostics from this hospitalization (including imaging, microbiology, ancillary and laboratory) are listed below for reference.    Significant Diagnostic Studies:     Dg Abd Acute W/chest  04/07/2012  *RADIOLOGY REPORT*  Clinical Data: Hernia.  Urinary tract infection  ACUTE ABDOMEN SERIES (ABDOMEN 2 VIEW & CHEST 1 VIEW)  Comparison: 04/04/2012  Findings: Normal heart size.  No pleural effusion or edema. Biapical scarring is similar to previous exam.  No superimposed airspace consolidation.  Moderate stool burden identified within the colon.  No dilated loops of small bowel or fluid levels identified.  IMPRESSION:  1.  Nonobstructive bowel gas pattern. 2.  No active cardiopulmonary abnormalities.   Original Report Authenticated By: Kerby Moors, M.D.     Microbiology: Recent Results (from the past 240 hour(s))  URINE CULTURE     Status: Normal   Collection Time   04/04/12  2:36 PM      Component Value Range Status Comment   Specimen Description URINE, CLEAN CATCH   Final    Special  Requests NONE   Final    Culture  Setup Time 04/04/2012 15:09   Final    Colony Count 60,000 COLONIES/ML   Final    Culture ESCHERICHIA COLI   Final    Report Status 04/06/2012 FINAL   Final    Organism ID, Bacteria ESCHERICHIA COLI   Final   URINE CULTURE     Status: Normal   Collection Time   04/07/12  8:57 PM      Component Value Range Status Comment   Specimen Description URINE, CATHETERIZED   Final    Special Requests NONE   Final    Culture  Setup Time 04/07/2012 22:43   Final    Colony Count NO GROWTH   Final    Culture NO GROWTH   Final    Report Status 04/09/2012 FINAL   Final   CLOSTRIDIUM DIFFICILE BY PCR     Status: Normal   Collection Time   04/08/12  2:52 PM      Component Value Range Status Comment   C difficile by pcr NEGATIVE  NEGATIVE Final      Labs: Basic Metabolic Panel:  Lab [id number] 04/09/12 0755 04/08/12 0854 04/07/12 1602 04/04/12 1202  NA 136 134* 129* 130* 135  K 3.7 4.0 4.5 4.1 4.4  CL 103 102 95* 91* 98  CO2 22 22 23 22 23   GLUCOSE 121* 115* 132* 182* 131*  BUN 19 24* 32* 38* 25*  CREATININE 1.02 1.02 1.08 1.69* 1.40*  CALCIUM 9.5 9.4 9.5 10.1 9.9  MG -- -- -- -- --  PHOS -- -- -- -- --   CBC:  Lab 04/10/12 0750 04/09/12 0755 04/08/12 0854 04/07/12 1602 04/04/12 1202  WBC 10.7* 11.7* 17.1* 16.6* 12.5*  NEUTROABS -- -- -- -- 8.2*  HGB 12.3 11.4* 13.2 14.2 13.1  HCT 38.0 36.7 38.9 42.8 40.9  MCV 83.5 84.0 82.2 83.9 85.2  PLT 182 169 189 230 191   Cardiac Enzymes:  Lab 04/08/12 1706 04/08/12 1104 04/04/12 1202  CKTOTAL -- -- --  CKMB -- -- --  CKMBINDEX -- -- --  TROPONINI <0.30 <0.30 <0.30   CBG:  Lab 04/10/12 1702 04/10/12 1204 04/10/12 0808 04/09/12 2150 04/09/12 1705  GLUCAP 88 115* 130* 99 110*    Principal Problem:  *UTI (lower urinary tract infection) Active Problems:  HYPERLIPIDEMIA  OBESITY  HYPERTENSION, ESSENTIAL NOS  ABDOMINAL WALL HERNIA  DM2 (diabetes mellitus, type 2)  Sciatica  Sacroiliac joint  dysfunction  Constipation  Colon cancer H/O  Acute renal failure  Hyponatremia  Generalized weakness   Time coordinating discharge: *35 minutes Signed:  Murray Hodgkins, MD [facility] 04/10/2012, 5:46 PM

## 2012-04-10 NOTE — Progress Notes (Signed)
Physical Therapy Treatment Patient Details Name: Erin [name] MRN: [id number] DOB: 20-Aug-1928 Today's Date: 04/10/2012 Time: [id number] PT Time Calculation (min): 13 min  PT Assessment / Plan / Recommendation Comments on Treatment Session  Pt adm with dehydration.  Pt making steady progress.    Follow Up Recommendations  Home health PT;Supervision/Assistance - 24 hour     Does the patient have the potential to tolerate intense rehabilitation     Barriers to Discharge        Equipment Recommendations  None recommended by PT    Recommendations for Other Services    Frequency Min 3X/week   Plan Discharge plan remains appropriate    Precautions / Restrictions Precautions Precautions: Fall   Pertinent Vitals/Pain No c/o's of pain.    Mobility  Bed Mobility Supine to Sit: 4: Min assist;HOB elevated Sitting - Scoot to Edge of Bed: 4: Min assist Details for Bed Mobility Assistance: Needs extra time to perform. Needs assist to bring hips to EOB. Transfers Sit to Stand: 1: +2 Total assist;With upper extremity assist;From bed;3: Mod assist;From chair/3-in-1;With armrests Sit to Stand: Patient Percentage: 60% Stand to Sit: 4: Min assist;With armrests;With upper extremity assist;To chair/3-in-1 Details for Transfer Assistance: verbal/tactile cues for hand placement and to scoot to edge of chair/bed prior to stand.  Required 2 person assist from low surface. Ambulation/Gait Ambulation/Gait Assistance: 4: Min assist (+2 to follow with chair to incr amb distance) Ambulation Distance (Feet): 50 Feet (25' x 2 with sitting rest) Assistive device: Rolling walker Ambulation/Gait Assistance Details: Verbal cues to stand more erect Gait Pattern: Step-through pattern;Decreased step length - right;Decreased step length - left;Trunk flexed;Shuffle Gait velocity: decr    Exercises     PT Diagnosis:    PT Problem List:   PT Treatment Interventions:     PT Goals Acute Rehab PT Goals PT  Goal: Sit to Stand - Progress: Progressing toward goal PT Goal: Ambulate - Progress: Progressing toward goal  Visit Information  Last PT Received On: 04/10/12 Assistance Needed: +2    Subjective Data  Subjective: Pt states she has been dehydrated before.   Cognition  Overall Cognitive Status: Appears within functional limits for tasks assessed/performed Arousal/Alertness: Awake/alert Orientation Level: Appears intact for tasks assessed Behavior During Session: [facility] for tasks performed    Balance  Static Standing Balance Static Standing - Balance Support: Bilateral upper extremity supported (on walker) Static Standing - Level of Assistance: 4: Min assist  End of Session PT - End of Session Activity Tolerance: Patient limited by fatigue Patient left: in bed;with call bell/phone within reach Nurse Communication: Mobility status   GP     [facility] 04/10/2012, 11:00 AM  [employer] PT [phone]

## 2012-04-10 NOTE — Progress Notes (Signed)
[facility] PROGRESS NOTE  Erin [name] [id number] DOB: January 12, 1929 DOA: 04/07/2012 PCP: [name], MD  Assessment/Plan: 1. Escherichia coli UTI--continues to improve. While blood cell count nearly normal. Repeat culture on revealing, not surprising as patient is on antibiotics. Discharge home on oral antibiotics. 2. Acute renal failure--resolved. Secondary to poor oral intake at home, complicated by lisinopril (on hold).  3. Hyponatremia--resolved. Likely secondary to dehydration. Serum osmolality normal.  4. Anorexia--improving, likely secondary to acute illness.  5. Generalized weakness, dizziness--likely secondary to dehydration, UTI. Physical therapy consult noted. Orthostatic hypotension appears resolved. 6. Indigestion/atypical chest--relieved with TUMS. Telemetry reassuring. Enzymes negative. EKG is reviewed, no significant change is seen. History most suggestive of indigestion. No further evaluation suggested at this time. Continue PPI. 7. Incidental rectal bleeding--was fecal occult blood positive. No evidence of ongoing bleeding. Repeat hemoglobin stable. Followup as outpatient. Noted anemia 12/2011 and 06/2011. 8. Chronic Low back and right leg sciatica pain x2 weeks--patient continues to deny significant back or leg pain at this time. No focal neurologic symptoms. She confirms she has chronic low back pain. 9. Constipation--relieved. Continue bowel regimen while on narcotics. Likely secondary to narcotics as an outpatient. 10. Diabetes mellitus type 2, diet controlled, hemoglobin A1c 6.9--stable. Sliding-scale insulin. 11. Hypertension--stable. Started on metoprolol as ACE inhibitor held. 12. Abdominal wall hernia--stable.   Code Status: DO NOT RESUSCITATE Family Communication: None present Disposition Plan: Home with home health  Murray Hodgkins, MD  [facility] Team 4  Pager [phone] If 7PM-7AM, please contact night-coverage at [email or web address], password  [facility] 04/10/2012, 5:27 PM  LOS: 3 days   Brief narrative: 78 year old woman presented with a six-day history of dizziness exacerbated by standing as well as exacerbation of chronic back pain. Admitted for UTI and dehydration, acute renal failure.  Consultants:  Physical therapy: Home health PT. No equipment recommended.  Procedures:  None  Antibiotics:  Ceftriaxone 12/27 >> 12/29  cipro 12/30 >> 1/2  HPI/Subjective: Feels continues to feel better. Progressing well with physical therapy. Dizziness gone. Ready to go home.  Objective: Filed Vitals:   04/09/12 2105 04/10/12 0526 04/10/12 1102 04/10/12 1413  BP: 183/69 131/46 167/77 145/55  Pulse: 80 75 80 73  Temp: 98.1 F (36.7 C) 98.7 F (37.1 C)  97.5 F (36.4 C)  TempSrc: Oral Oral  Oral  Resp: 18 20  18   Height:      Weight:  107.7 kg (237 lb 7 oz)    SpO2: 97% 100%  100%    Intake/Output Summary (Last 24 hours) at 04/10/12 1727 Last data filed at 04/10/12 1412  Gross per 24 hour  Intake   1095 ml  Output    400 ml  Net    695 ml   Filed Weights   04/08/12 0550 04/09/12 0500 04/10/12 0526  Weight: 104 kg (229 lb 4.5 oz) 104.2 kg (229 lb 11.5 oz) 107.7 kg (237 lb 7 oz)    Exam:  General:  Appears calm and comfortable Cardiovascular: RRR, no m/r/g. No LE edema. Respiratory: CTA bilaterally, no w/r/r. Normal respiratory effort. Psychiatric: grossly normal mood and affect, speech fluent and appropriate  Exam current 12/30  Data Reviewed: Basic Metabolic Panel:  Lab [id number] 0750 04/09/12 0755 04/08/12 0854 04/07/12 1602 04/04/12 1202  NA 136 134* 129* 130* 135  K 3.7 4.0 4.5 4.1 4.4  CL 103 102 95* 91* 98  CO2 22 22 23 22 23   GLUCOSE 121* 115* 132* 182* 131*  BUN 19 24* 32* 38* 25*  CREATININE 1.02 1.02 1.08 1.69* 1.40*  CALCIUM 9.5 9.4 9.5 10.1 9.9  MG -- -- -- -- --  PHOS -- -- -- -- --   CBC:  Lab 04/10/12 0750 04/09/12 0755 04/08/12 0854 04/07/12 1602 04/04/12 1202  WBC 10.7* 11.7* 17.1*  16.6* 12.5*  NEUTROABS -- -- -- -- 8.2*  HGB 12.3 11.4* 13.2 14.2 13.1  HCT 38.0 36.7 38.9 42.8 40.9  MCV 83.5 84.0 82.2 83.9 85.2  PLT 182 169 189 230 191   Cardiac Enzymes:  Lab 04/08/12 1706 04/08/12 1104 04/04/12 1202  CKTOTAL -- -- --  CKMB -- -- --  CKMBINDEX -- -- --  TROPONINI <0.30 <0.30 <0.30   CBG:  Lab 04/10/12 1702 04/10/12 1204 04/10/12 0808 04/09/12 2150 04/09/12 1705  GLUCAP 88 115* 130* 99 110*    Recent Results (from the past 240 hour(s))  URINE CULTURE     Status: Normal   Collection Time   04/04/12  2:36 PM      Component Value Range Status Comment   Specimen Description URINE, CLEAN CATCH   Final    Special Requests NONE   Final    Culture  Setup Time 04/04/2012 15:09   Final    Colony Count 60,000 COLONIES/ML   Final    Culture ESCHERICHIA COLI   Final    Report Status 04/06/2012 FINAL   Final    Organism ID, Bacteria ESCHERICHIA COLI   Final   URINE CULTURE     Status: Normal   Collection Time   04/07/12  8:57 PM      Component Value Range Status Comment   Specimen Description URINE, CATHETERIZED   Final    Special Requests NONE   Final    Culture  Setup Time 04/07/2012 22:43   Final    Colony Count NO GROWTH   Final    Culture NO GROWTH   Final    Report Status 04/09/2012 FINAL   Final   CLOSTRIDIUM DIFFICILE BY PCR     Status: Normal   Collection Time   04/08/12  2:52 PM      Component Value Range Status Comment   C difficile by pcr NEGATIVE  NEGATIVE Final      Studies: No results found.  Scheduled Meds:    . aspirin  325 mg Oral Daily  . atorvastatin  10 mg Oral Daily  . calcium carbonate  1,250 mg Oral BID WC  . cefTRIAXone (ROCEPHIN) IVPB 1 gram/50 mL D5W  1 g Intravenous QHS  . docusate sodium  100 mg Oral BID  . ferrous sulfate  325 mg Oral Q breakfast  . imipramine  100 mg Oral QHS  . insulin aspart  0-9 Units Subcutaneous TID WC  . metoprolol tartrate  50 mg Oral BID  . pantoprazole  40 mg Oral Daily  . polyethylene  glycol  17 g Oral Daily  . sodium chloride  3 mL Intravenous Q12H  . vitamin C  500 mg Oral Daily   Continuous Infusions:    . sodium chloride 75 mL (04/10/12 1316)    Principal Problem:  *UTI (lower urinary tract infection) Active Problems:  HYPERLIPIDEMIA  OBESITY  HYPERTENSION, ESSENTIAL NOS  ABDOMINAL WALL HERNIA  DM2 (diabetes mellitus, type 2)  Sciatica  Sacroiliac joint dysfunction  Constipation  Colon cancer H/O  Acute renal failure  Hyponatremia  Generalized weakness     Murray Hodgkins, MD  [facility] Team 4  Pager [phone] If 7PM-7AM, please  contact night-coverage at [email or web address], password [facility] 04/10/2012, 5:27 PM  LOS: 3 days

## 2012-04-11 NOTE — Care Management Note (Signed)
    Page 1 of 1   04/11/2012     4:58:42 PM   CARE MANAGEMENT NOTE 04/11/2012  Patient:  Remillard,Rhyen L   Account Number:  [id number]  Date Initiated:  04/11/2012  Documentation initiated by:  Tomi Bamberger  Subjective/Objective Assessment:   dx uti  admit     Action/Plan:   pt eval- rec hhpt   Anticipated DC Date:  04/10/2012   Anticipated DC Plan:  [place]  CM consult      [facility] Choice  HOME HEALTH   Choice offered to / List presented to:  C-1 Patient        [place] arranged  [place] [facility]      [place].   Status of service:  Completed, signed off Medicare Important Message given?   (If response is "NO", the following Medicare IM given date fields will be blank) Date Medicare IM given:   Date Additional Medicare IM given:    Discharge Disposition:  [place]  Per UR Regulation:  Reviewed for med. necessity/level of care/duration of stay  If discussed at [place] of Stay Meetings, dates discussed:    Comments:  04/11/12 16:57 Tomi Bamberger RN, BSN [phone] patient dc to home with hh service.  Patient chose [facility] for hhpt, referral made to [facility], Butch Penny notiifed.  Soc will begin 24-48 hrs post discharge.

## 2012-04-15 ENCOUNTER — Observation Stay (HOSPITAL_COMMUNITY)
Admission: EM | Admit: 2012-04-15 | Discharge: 2012-04-18 | Payer: Medicare PPO | Attending: Internal Medicine | Admitting: Internal Medicine

## 2012-04-15 DIAGNOSIS — E871 Hypo-osmolality and hyponatremia: Secondary | ICD-10-CM | POA: Insufficient documentation

## 2012-04-15 DIAGNOSIS — R112 Nausea with vomiting, unspecified: Secondary | ICD-10-CM | POA: Insufficient documentation

## 2012-04-15 DIAGNOSIS — N182 Chronic kidney disease, stage 2 (mild): Secondary | ICD-10-CM

## 2012-04-15 DIAGNOSIS — M1990 Unspecified osteoarthritis, unspecified site: Secondary | ICD-10-CM

## 2012-04-15 DIAGNOSIS — N189 Chronic kidney disease, unspecified: Secondary | ICD-10-CM | POA: Insufficient documentation

## 2012-04-15 DIAGNOSIS — G479 Sleep disorder, unspecified: Secondary | ICD-10-CM

## 2012-04-15 DIAGNOSIS — E669 Obesity, unspecified: Secondary | ICD-10-CM | POA: Diagnosis present

## 2012-04-15 DIAGNOSIS — M5430 Sciatica, unspecified side: Secondary | ICD-10-CM

## 2012-04-15 DIAGNOSIS — M545 Low back pain: Secondary | ICD-10-CM

## 2012-04-15 DIAGNOSIS — N289 Disorder of kidney and ureter, unspecified: Secondary | ICD-10-CM | POA: Diagnosis not present

## 2012-04-15 DIAGNOSIS — E860 Dehydration: Secondary | ICD-10-CM | POA: Diagnosis present

## 2012-04-15 DIAGNOSIS — C189 Malignant neoplasm of colon, unspecified: Secondary | ICD-10-CM

## 2012-04-15 DIAGNOSIS — E785 Hyperlipidemia, unspecified: Secondary | ICD-10-CM | POA: Diagnosis present

## 2012-04-15 DIAGNOSIS — I8000 Phlebitis and thrombophlebitis of superficial vessels of unspecified lower extremity: Secondary | ICD-10-CM

## 2012-04-15 DIAGNOSIS — Z1231 Encounter for screening mammogram for malignant neoplasm of breast: Secondary | ICD-10-CM

## 2012-04-15 DIAGNOSIS — L89609 Pressure ulcer of unspecified heel, unspecified stage: Secondary | ICD-10-CM

## 2012-04-15 DIAGNOSIS — E1142 Type 2 diabetes mellitus with diabetic polyneuropathy: Secondary | ICD-10-CM | POA: Insufficient documentation

## 2012-04-15 DIAGNOSIS — N200 Calculus of kidney: Secondary | ICD-10-CM | POA: Diagnosis not present

## 2012-04-15 DIAGNOSIS — N179 Acute kidney failure, unspecified: Principal | ICD-10-CM | POA: Insufficient documentation

## 2012-04-15 DIAGNOSIS — K649 Unspecified hemorrhoids: Secondary | ICD-10-CM

## 2012-04-15 DIAGNOSIS — I10 Essential (primary) hypertension: Secondary | ICD-10-CM | POA: Diagnosis present

## 2012-04-15 DIAGNOSIS — J449 Chronic obstructive pulmonary disease, unspecified: Secondary | ICD-10-CM | POA: Diagnosis not present

## 2012-04-15 DIAGNOSIS — K439 Ventral hernia without obstruction or gangrene: Secondary | ICD-10-CM

## 2012-04-15 DIAGNOSIS — E1149 Type 2 diabetes mellitus with other diabetic neurological complication: Secondary | ICD-10-CM | POA: Insufficient documentation

## 2012-04-15 DIAGNOSIS — R5381 Other malaise: Secondary | ICD-10-CM | POA: Insufficient documentation

## 2012-04-15 DIAGNOSIS — N390 Urinary tract infection, site not specified: Secondary | ICD-10-CM | POA: Diagnosis not present

## 2012-04-15 DIAGNOSIS — R531 Weakness: Secondary | ICD-10-CM

## 2012-04-15 DIAGNOSIS — M810 Age-related osteoporosis without current pathological fracture: Secondary | ICD-10-CM

## 2012-04-15 DIAGNOSIS — M533 Sacrococcygeal disorders, not elsewhere classified: Secondary | ICD-10-CM

## 2012-04-15 DIAGNOSIS — K5900 Constipation, unspecified: Secondary | ICD-10-CM | POA: Insufficient documentation

## 2012-04-15 DIAGNOSIS — K2990 Gastroduodenitis, unspecified, without bleeding: Secondary | ICD-10-CM | POA: Diagnosis not present

## 2012-04-15 DIAGNOSIS — Z780 Asymptomatic menopausal state: Secondary | ICD-10-CM

## 2012-04-15 DIAGNOSIS — E119 Type 2 diabetes mellitus without complications: Secondary | ICD-10-CM

## 2012-04-15 DIAGNOSIS — E872 Acidosis, unspecified: Secondary | ICD-10-CM | POA: Insufficient documentation

## 2012-04-15 DIAGNOSIS — E875 Hyperkalemia: Secondary | ICD-10-CM | POA: Insufficient documentation

## 2012-04-15 DIAGNOSIS — D72829 Elevated white blood cell count, unspecified: Secondary | ICD-10-CM | POA: Insufficient documentation

## 2012-04-15 DIAGNOSIS — N19 Unspecified kidney failure: Secondary | ICD-10-CM | POA: Diagnosis not present

## 2012-04-15 DIAGNOSIS — D62 Acute posthemorrhagic anemia: Secondary | ICD-10-CM

## 2012-04-15 DIAGNOSIS — I129 Hypertensive chronic kidney disease with stage 1 through stage 4 chronic kidney disease, or unspecified chronic kidney disease: Secondary | ICD-10-CM | POA: Insufficient documentation

## 2012-04-15 DIAGNOSIS — R109 Unspecified abdominal pain: Secondary | ICD-10-CM | POA: Insufficient documentation

## 2012-04-15 LAB — CBC WITH DIFFERENTIAL/PLATELET
Basophils Relative: 0 % (ref 0–1)
Eosinophils Absolute: 0 10*3/uL (ref 0.0–0.7)
Eosinophils Relative: 0 % (ref 0–5)
Lymphs Abs: 2.3 10*3/uL (ref 0.7–4.0)
MCH: 27.1 pg (ref 26.0–34.0)
MCHC: 32.5 g/dL (ref 30.0–36.0)
MCV: 83.2 fL (ref 78.0–100.0)
Platelets: 208 10*3/uL (ref 150–400)
RBC: 5.06 MIL/uL (ref 3.87–5.11)

## 2012-04-15 MED ORDER — ONDANSETRON HCL 4 MG/2ML IJ SOLN
4.0000 mg | Freq: Once | INTRAMUSCULAR | Status: AC
  Administered 2012-04-15: 4 mg via INTRAVENOUS
  Filled 2012-04-15: qty 2

## 2012-04-15 MED ORDER — MORPHINE SULFATE 4 MG/ML IJ SOLN
4.0000 mg | Freq: Once | INTRAMUSCULAR | Status: AC
  Administered 2012-04-16: 4 mg via INTRAVENOUS
  Filled 2012-04-15: qty 1

## 2012-04-15 MED ORDER — SODIUM CHLORIDE 0.9 % IV BOLUS (SEPSIS)
1000.0000 mL | Freq: Once | INTRAVENOUS | Status: AC
  Administered 2012-04-15: 1000 mL via INTRAVENOUS

## 2012-04-15 NOTE — ED Notes (Signed)
The patient states that she has had abdominal pain, nausea, vomiting, weakness, and dizziness since 04/12/12.

## 2012-04-15 NOTE — ED Notes (Signed)
MD at bedside. 

## 2012-04-15 NOTE — ED Provider Notes (Addendum)
History     CSN: [id number]  Arrival date & time 04/15/12  2225   First MD Initiated Contact with Patient 04/15/12 2300      Chief Complaint  Patient presents with  . Abdominal Pain  . Nausea  . Emesis  . Weakness  . Dizziness    (Consider location/radiation/quality/duration/timing/severity/associated sxs/prior treatment) The history is provided by the patient.  Erin [name] is a 79 y.o. female history of diabetes, hypertension, obesity, upper GI bleed here presenting with weakness and nausea and abdominal pain. Has similar symptoms several weeks ago and was admitted for a week and discharged a week ago. During the mission she was found to have a urinary tract infection and finished a course of Bactrim. Since discharge she felt nauseous and had decreased by mouth intake. She also had worsening diffuse abdominal pain for the last 3 days. She felt nauseous but didn't vomit. She was constipated initially but had a large bowel movement here in the ED. Denies any fevers or chills. No urinary symptoms.   Past Medical History  Diagnosis Date  . Diabetes mellitus   . Hyperlipidemia   . Osteoarthritis   . Osteoporosis   . Hypertension   . Obesity   . Upper GI bleed     AV malformation/when anticoag  . Sacroiliac joint dysfunction 04/01/2012    Past Surgical History  Procedure Date  . Appendectomy   . Cholecystectomy   . Colon resection 1988    secondary to cancer  . Polypectomy 1998    benign X 2  . Cervicitis 1964    conization   . Total knee arthroplasty 07/2009    right   . Endometrial polyp 05/2000    hyperplasia-laser treatment    Family History  Problem Relation Age of Onset  . Stroke Father   . Cancer Brother     lung    History  Substance Use Topics  . Smoking status: Former Smoker    Quit date: 08/13/2000  . Smokeless tobacco: Never Used     Comment: Quit over 10 years ago  . Alcohol Use: No    OB History    Grav Para Term Preterm Abortions TAB SAB  Ect Mult Living                  Review of Systems  Gastrointestinal: Positive for nausea and abdominal pain.  Neurological: Positive for weakness.  All other systems reviewed and are negative.    Allergies  Diclofenac sodium; Fluoxetine hcl; and Shellfish allergy  Home Medications   Current Outpatient Rx  Name  Route  Sig  Dispense  Refill  . VITAMIN C 500 MG PO TABS   Oral   Take 500 mg by mouth daily.           . ASPIRIN 325 MG PO TBEC   Oral   Take 325 mg by mouth daily.           . ATORVASTATIN CALCIUM 10 MG PO TABS   Oral   Take 10 mg by mouth daily.         Marland Kitchen BISACODYL 10 MG RE SUPP   Rectal   Place 10 mg rectally daily as needed. For constipation         . CALCIUM CARBONATE 600 MG PO TABS   Oral   Take 600 mg by mouth 2 (two) times daily with a meal.           . CYCLOBENZAPRINE HCL  10 MG PO TABS   Oral   Take 1 tablet (10 mg total) by mouth at bedtime as needed for muscle spasms.   30 tablet   0   . DSS 100 MG PO CAPS   Oral   Take 100 mg by mouth 2 (two) times daily as needed for constipation.         Marland Kitchen FERROUS SULFATE 325 (65 FE) MG PO TABS   Oral   Take 325 mg by mouth daily with breakfast.         . IMIPRAMINE HCL 50 MG PO TABS   Oral   Take 100 mg by mouth at bedtime.         Marland Kitchen METOPROLOL TARTRATE 50 MG PO TABS   Oral   Take 1 tablet (50 mg total) by mouth 2 (two) times daily.   60 tablet   0   . ADULT MULTIVITAMIN W/MINERALS CH   Oral   Take 1 tablet by mouth daily.         . OMEGA-3 FATTY ACIDS 300 MG PO CAPS   Oral   Take 1 capsule by mouth 3 (three) times daily.           Marland Kitchen OMEPRAZOLE 20 MG PO CPDR   Oral   Take 20 mg by mouth daily.         Carren Rang OP   Both Eyes   Place 1 drop into both eyes as needed. For dry eyes           BP 156/63  Pulse 95  Temp 98 F (36.7 C) (Oral)  Resp 16  Ht 5' 10.5" (1.791 m)  Wt 233 lb (105.688 kg)  BMI 32.96 kg/m2  SpO2 100%  Physical Exam  Nursing  note and vitals reviewed. Constitutional: She is oriented to person, place, and time.       Chronically ill   HENT:  Head: Normocephalic.       MM slightly dry   Eyes: Conjunctivae normal are normal. Pupils are equal, round, and reactive to light.  Neck: Normal range of motion. Neck supple.  Cardiovascular: Normal rate, regular rhythm and normal heart sounds.   Pulmonary/Chest: Effort normal and breath sounds normal. No respiratory distress. She has no wheezes. She has no rales.  Abdominal: Soft.       Mild diffuse tenderness, no rebound   Musculoskeletal: Normal range of motion.  Neurological: She is alert and oriented to person, place, and time.  Skin: Skin is warm and dry.  Psychiatric: She has a normal mood and affect. Her behavior is normal. Judgment and thought content normal.    ED Course  Procedures (including critical care time)  Labs Reviewed  CBC WITH DIFFERENTIAL - Abnormal; Notable for the following:    WBC 15.2 (*)     Neutrophils Relative 80 (*)     Neutro Abs 12.1 (*)     All other components within normal limits  COMPREHENSIVE METABOLIC PANEL - Abnormal; Notable for the following:    Sodium 126 (*)     Potassium 5.3 (*)     Chloride 92 (*)     CO2 16 (*)     Glucose, Bld 154 (*)     BUN 26 (*)     Creatinine, Ser 1.63 (*)     AST 51 (*)     ALT 43 (*)     GFR calc non Af Amer 28 (*)     GFR calc  Af Amer 33 (*)     All other components within normal limits  LIPASE, BLOOD - Abnormal; Notable for the following:    Lipase 120 (*)     All other components within normal limits  URINALYSIS, ROUTINE W REFLEX MICROSCOPIC - Abnormal; Notable for the following:    Color, Urine AMBER (*)  BIOCHEMICALS MAY BE AFFECTED BY COLOR   APPearance CLOUDY (*)     Bilirubin Urine MODERATE (*)     Ketones, ur 15 (*)     Protein, ur 30 (*)     All other components within normal limits  URINE MICROSCOPIC-ADD ON - Abnormal; Notable for the following:    Casts HYALINE CASTS  (*)     All other components within normal limits  TROPONIN I  URINE CULTURE   Dg Chest 2 View  04/16/2012  *RADIOLOGY REPORT*  Clinical Data: Diffuse abdominal pain, nausea and vomiting.  CHEST - 2 VIEW  Comparison: 04/07/2012.  Findings: Normal sized heart.  Clear lungs.  The lungs remain mildly hyperexpanded.  Diffuse osteopenia and minimal thoracic spine degenerative changes.  IMPRESSION:  1.  No acute abnormality. 2.  Stable mild changes of COPD.   Original Report Authenticated By: Claudie Revering, M.D.      No diagnosis found.   Date: 04/15/2012  Rate: 95  Rhythm: normal sinus rhythm  QRS Axis: normal  Intervals: normal  ST/T Wave abnormalities: normal  Conduction Disutrbances:none  Narrative Interpretation:   Old EKG Reviewed: unchanged    MDM  Erin [name] is a 79 y.o. female here with weakness, ab pain, dehydration. Will need to r/o infectious cause such as pneumonia or recurrent UTI. Will do CT ab/pel to work up ab pain (wasn't done during last admission). Will give IVF and reassess.   1:25 AM Labs showed Cr. 1.6, up from 1.0. K 5.3 with no EKG changes. UA normal. Lipase 120, and has been elevated before. I ordered CT ab/pel. I called hospitalist, Dr. Clotilde Dieter, who accepted the patient and will evaluate patient in the ED. She is admitted for dehydration and abdominal pain.        Wandra Arthurs, MD 04/16/12 0127  Wandra Arthurs, MD 04/16/12 [phone]

## 2012-04-16 ENCOUNTER — Emergency Department (HOSPITAL_COMMUNITY): Payer: Medicare PPO

## 2012-04-16 ENCOUNTER — Encounter (HOSPITAL_COMMUNITY): Payer: Self-pay | Admitting: *Deleted

## 2012-04-16 DIAGNOSIS — E860 Dehydration: Secondary | ICD-10-CM | POA: Diagnosis not present

## 2012-04-16 DIAGNOSIS — N179 Acute kidney failure, unspecified: Secondary | ICD-10-CM | POA: Diagnosis not present

## 2012-04-16 DIAGNOSIS — R6889 Other general symptoms and signs: Secondary | ICD-10-CM

## 2012-04-16 DIAGNOSIS — N189 Chronic kidney disease, unspecified: Secondary | ICD-10-CM | POA: Diagnosis not present

## 2012-04-16 DIAGNOSIS — I129 Hypertensive chronic kidney disease with stage 1 through stage 4 chronic kidney disease, or unspecified chronic kidney disease: Secondary | ICD-10-CM | POA: Diagnosis not present

## 2012-04-16 DIAGNOSIS — N200 Calculus of kidney: Secondary | ICD-10-CM | POA: Diagnosis not present

## 2012-04-16 DIAGNOSIS — R109 Unspecified abdominal pain: Secondary | ICD-10-CM | POA: Diagnosis not present

## 2012-04-16 DIAGNOSIS — J449 Chronic obstructive pulmonary disease, unspecified: Secondary | ICD-10-CM | POA: Diagnosis not present

## 2012-04-16 LAB — URINALYSIS, ROUTINE W REFLEX MICROSCOPIC
Leukocytes, UA: NEGATIVE
Nitrite: NEGATIVE
Protein, ur: 30 mg/dL — AB
Urobilinogen, UA: 1 mg/dL (ref 0.0–1.0)

## 2012-04-16 LAB — BASIC METABOLIC PANEL
CO2: 19 mEq/L (ref 19–32)
Calcium: 9.7 mg/dL (ref 8.4–10.5)
Chloride: 98 mEq/L (ref 96–112)
Creatinine, Ser: 1.3 mg/dL — ABNORMAL HIGH (ref 0.50–1.10)
GFR calc non Af Amer: 37 mL/min — ABNORMAL LOW (ref >90)
Potassium: 5.1 mEq/L (ref 3.5–5.1)

## 2012-04-16 LAB — URINE MICROSCOPIC-ADD ON

## 2012-04-16 LAB — COMPREHENSIVE METABOLIC PANEL
Albumin: 3.7 g/dL (ref 3.5–5.2)
BUN: 26 mg/dL — ABNORMAL HIGH (ref 6–23)
Calcium: 10.5 mg/dL (ref 8.4–10.5)
GFR calc Af Amer: 33 mL/min — ABNORMAL LOW (ref >90)
Glucose, Bld: 154 mg/dL — ABNORMAL HIGH (ref 70–99)
Sodium: 126 mEq/L — ABNORMAL LOW (ref 135–145)
Total Protein: 8 g/dL (ref 6.0–8.3)

## 2012-04-16 LAB — CBC
MCH: 27.6 pg (ref 26.0–34.0)
MCHC: 33.2 g/dL (ref 30.0–36.0)
Platelets: 195 10*3/uL (ref 150–400)
RBC: 4.45 MIL/uL (ref 3.87–5.11)

## 2012-04-16 LAB — OSMOLALITY: Osmolality: 279 mOsm/kg (ref 275–300)

## 2012-04-16 LAB — LIPASE, BLOOD: Lipase: 120 U/L — ABNORMAL HIGH (ref 11–59)

## 2012-04-16 MED ORDER — BISACODYL 10 MG RE SUPP: 10.0000 mg | Freq: Every day | RECTAL | Status: DC | PRN

## 2012-04-16 MED ORDER — OMEGA-3 FATTY ACIDS 300 MG PO CAPS: 1.0000 | ORAL_CAPSULE | Freq: Three times a day (TID) | ORAL | Status: DC

## 2012-04-16 MED ORDER — ONDANSETRON HCL 4 MG/2ML IJ SOLN
4.0000 mg | Freq: Once | INTRAMUSCULAR | Status: AC
  Administered 2012-04-16: 4 mg via INTRAVENOUS
  Filled 2012-04-16 (×2): qty 2

## 2012-04-16 MED ORDER — ENOXAPARIN SODIUM 30 MG/0.3ML ~~LOC~~ SOLN
30.0000 mg | SUBCUTANEOUS | Status: DC
  Administered 2012-04-16 – 2012-04-17 (×2): 30 mg via SUBCUTANEOUS
  Filled 2012-04-16 (×2): qty 0.3

## 2012-04-16 MED ORDER — ACETAMINOPHEN 650 MG RE SUPP: 650.0000 mg | Freq: Four times a day (QID) | RECTAL | Status: DC | PRN

## 2012-04-16 MED ORDER — ONDANSETRON HCL 4 MG PO TABS: 4.0000 mg | ORAL_TABLET | Freq: Four times a day (QID) | ORAL | Status: DC | PRN

## 2012-04-16 MED ORDER — OMEGA-3-ACID ETHYL ESTERS 1 G PO CAPS
1.0000 g | ORAL_CAPSULE | Freq: Every day | ORAL | Status: DC
  Administered 2012-04-16 – 2012-04-18 (×3): 1 g via ORAL
  Filled 2012-04-16 (×3): qty 1

## 2012-04-16 MED ORDER — ASPIRIN EC 325 MG PO TBEC
325.0000 mg | DELAYED_RELEASE_TABLET | Freq: Every day | ORAL | Status: DC
  Administered 2012-04-16 – 2012-04-18 (×3): 325 mg via ORAL
  Filled 2012-04-16 (×3): qty 1

## 2012-04-16 MED ORDER — IOHEXOL 300 MG/ML  SOLN: 20.0000 mL | INTRAMUSCULAR | Status: AC

## 2012-04-16 MED ORDER — IMIPRAMINE HCL 50 MG PO TABS
100.0000 mg | ORAL_TABLET | Freq: Every day | ORAL | Status: DC
  Administered 2012-04-16 – 2012-04-17 (×2): 100 mg via ORAL
  Filled 2012-04-16 (×3): qty 2

## 2012-04-16 MED ORDER — ACETAMINOPHEN 325 MG PO TABS: 650.0000 mg | ORAL_TABLET | Freq: Four times a day (QID) | ORAL | Status: DC | PRN

## 2012-04-16 MED ORDER — METOPROLOL TARTRATE 1 MG/ML IV SOLN
5.0000 mg | Freq: Once | INTRAVENOUS | Status: AC
  Administered 2012-04-16: 5 mg via INTRAVENOUS
  Filled 2012-04-16: qty 5

## 2012-04-16 MED ORDER — HYDROCODONE-ACETAMINOPHEN 5-325 MG PO TABS
1.0000 | ORAL_TABLET | Freq: Four times a day (QID) | ORAL | Status: DC | PRN
  Administered 2012-04-16 – 2012-04-17 (×3): 1 via ORAL
  Filled 2012-04-16 (×3): qty 1

## 2012-04-16 MED ORDER — METOPROLOL TARTRATE 50 MG PO TABS
50.0000 mg | ORAL_TABLET | Freq: Two times a day (BID) | ORAL | Status: DC
  Administered 2012-04-16 – 2012-04-18 (×5): 50 mg via ORAL
  Filled 2012-04-16 (×6): qty 1

## 2012-04-16 MED ORDER — SODIUM CHLORIDE 0.9 % IV SOLN
INTRAVENOUS | Status: DC
  Administered 2012-04-16 – 2012-04-17 (×3): via INTRAVENOUS

## 2012-04-16 MED ORDER — ATORVASTATIN CALCIUM 10 MG PO TABS
10.0000 mg | ORAL_TABLET | Freq: Every day | ORAL | Status: DC
  Administered 2012-04-16 – 2012-04-18 (×3): 10 mg via ORAL
  Filled 2012-04-16 (×3): qty 1

## 2012-04-16 MED ORDER — CYCLOBENZAPRINE HCL 10 MG PO TABS
10.0000 mg | ORAL_TABLET | Freq: Every evening | ORAL | Status: DC | PRN
  Administered 2012-04-17: 10 mg via ORAL
  Filled 2012-04-16: qty 1

## 2012-04-16 MED ORDER — PANTOPRAZOLE SODIUM 40 MG PO TBEC
40.0000 mg | DELAYED_RELEASE_TABLET | Freq: Every day | ORAL | Status: DC
  Administered 2012-04-16 – 2012-04-18 (×3): 40 mg via ORAL
  Filled 2012-04-16 (×2): qty 1

## 2012-04-16 MED ORDER — ALUM & MAG HYDROXIDE-SIMETH 200-200-20 MG/5ML PO SUSP
30.0000 mL | Freq: Four times a day (QID) | ORAL | Status: DC | PRN
  Filled 2012-04-16: qty 30

## 2012-04-16 MED ORDER — ONDANSETRON HCL 4 MG/2ML IJ SOLN: 4.0000 mg | Freq: Four times a day (QID) | INTRAMUSCULAR | Status: DC | PRN

## 2012-04-16 NOTE — Progress Notes (Signed)
Physical Therapy Evaluation Patient Details Name: Erin [name] MRN: [id number] DOB: 1928-07-01 Today's Date: 04/16/2012 Time: 1207-1238 PT Time Calculation (min): 31 min  PT Assessment / Plan / Recommendation Clinical Impression  79 yo pt admitted with N/V, kidney injury, also with very recent admission with UTI, dehydration, dizziness; Presents with decr functional mobility, weakness. significantly decr activity tol; Pt will need to make significant progress with mobility in order to be able to dc home; It is worth looking into rehab options to maximize independence and safety prior to dc home    PT Assessment  Patient needs continued PT services    Follow Up Recommendations  CIR;Home health PT;Supervision/Assistance - 24 hour    Does the patient have the potential to tolerate intense rehabilitation      Barriers to Discharge        Equipment Recommendations   (does pt have 3 in 1?)    Recommendations for Other Services OT consult;Rehab consult   Frequency Min 3X/week    Precautions / Restrictions Precautions Precautions: Fall   Pertinent Vitals/Pain Reported dizziness while on BSC, coupled with look of significant fatigue; Unable to obtain BP while pt was sitting upright; BP was stable once pt was supine      Mobility  Bed Mobility Bed Mobility: Supine to Sit;Sitting - Scoot to Edge of Bed;Sit to Supine Supine to Sit: 4: Min assist;HOB elevated Sitting - Scoot to [employer] of Bed: 4: Min assist Sit to Supine: 4: Min assist;With rail;HOB elevated Details for Bed Mobility Assistance: Needs extra time to perform. Needs assist to bring hips to EOB. Transfers Transfers: Sit to Stand;Stand to Sit Sit to Stand: 3: Mod assist;2: Max assist;From bed;From chair/3-in-1;With armrests Stand to Sit: 4: Min assist;With armrests;With upper extremity assist;To chair/3-in-1 Details for Transfer Assistance: Apparent continued weakness, requiring heavy mod assist to rise from bed and near max  assist to rise from Bedside commode (also requried steadying BSC); Continued flexed posture in standing Ambulation/Gait Ambulation/Gait Assistance: 3: Mod assist Ambulation Distance (Feet): 3 Feet (pivot steps bed to [facility], then to bed) Assistive device: Rolling walker Ambulation/Gait Assistance Details: Cues for upright posture; Pt reported dizziness which did not imporve, so assisted he back to the bed Gait Pattern: Step-through pattern;Decreased step length - right;Decreased step length - left;Trunk flexed;Shuffle Gait velocity: decr    Shoulder Instructions     Exercises     PT Diagnosis: Difficulty walking;Abnormality of gait;Generalized weakness  PT Problem List: Decreased strength;Decreased activity tolerance;Decreased balance;Decreased mobility;Decreased knowledge of use of DME PT Treatment Interventions: DME instruction;Gait training;Functional mobility training;Patient/family education   PT Goals Acute Rehab PT Goals PT Goal Formulation: With patient Time For Goal Achievement: 04/30/12 Potential to Achieve Goals: Good Pt will go Supine/Side to Sit: with modified independence PT Goal: Supine/Side to Sit - Progress: Goal set today Pt will go Sit to Supine/Side: Independently;with HOB 0 degrees PT Goal: Sit to Supine/Side - Progress: Goal set today Pt will go Sit to Stand: with modified independence;with upper extremity assist PT Goal: Sit to Stand - Progress: Goal set today Pt will go Stand to Sit: with modified independence PT Goal: Stand to Sit - Progress: Goal set today Pt will Ambulate: 51 - 150 feet;with modified independence;with rolling walker PT Goal: Ambulate - Progress: Goal set today  Visit Information  Last PT Received On: 04/16/12 Assistance Needed: +2 (helpful)    Subjective Data  Subjective: Agreeable to OOB Patient Stated Goal: To return home.   Prior [place]  Living Lives With: Son Available Help at Discharge: Family;Available 24  hours/day Type of Home: Mobile home Home Access: Ramped entrance Home Layout: One level Bathroom Shower/Tub: Chiropodist: Standard Bathroom Accessibility: Yes How Accessible: Accessible via walker [place]: Bedside commode/3-in-1;Walker - rolling;Walker - four wheeled;Wheelchair - Metallurgist Comments: Son is special needs, and is able to assist patient with meals and housekeeping. Prior Function Level of Independence: Independent with assistive device(s);Needs assistance Needs Assistance: Meal Prep;Light Housekeeping Meal Prep: Moderate Light Housekeeping: Moderate Able to Take Stairs?: No Driving: Yes Vocation: Retired Comments: Used cane for amb prior to recent admission Communication Communication: No difficulties Dominant Hand: Right    Cognition  Overall Cognitive Status: Appears within functional limits for tasks assessed/performed Arousal/Alertness: Awake/alert Orientation Level: Appears intact for tasks assessed Behavior During Session: [facility] for tasks performed    Extremity/Trunk Assessment Right Upper Extremity Assessment RUE ROM/Strength/Tone: [facility] for tasks assessed Left Upper Extremity Assessment LUE ROM/Strength/Tone: WFL for tasks assessed Right Lower Extremity Assessment RLE ROM/Strength/Tone: Deficits RLE ROM/Strength/Tone Deficits: General weakness - strength 4-/5 RLE Sensation: WFL - Light Touch Left Lower Extremity Assessment LLE ROM/Strength/Tone: Deficits LLE ROM/Strength/Tone Deficits: General weakness - strength 4-/5 LLE Sensation: WFL - Light Touch   Balance    End of Session PT - End of Session Equipment Utilized During Treatment: Gait belt Activity Tolerance: Patient limited by fatigue Patient left: in bed;with call bell/phone within reach Nurse Communication: Mobility status  GP Functional Assessment Tool Used: Clinical Judgement Functional Limitation: Mobility: Walking and moving  around Mobility: Walking and Moving Around Current Status [id number]): At least 40 percent but less than 60 percent impaired, limited or restricted Mobility: Walking and Moving Around Goal Status [phone]): 0 percent impaired, limited or restricted   [place], [place], [place]  04/16/2012, 5:25 PM

## 2012-04-16 NOTE — H&P (Signed)
PCP:   [name], MD   Chief Complaint:  Weakness  HPI: This is a 79 year old female who was recently admitted here in discharge after 3 days, her admission diagnoses was a UTI. She completed her antibiotics, however she's continued not to feel well. Today she developed some stomach pains, stating that "she could not bear''. She was dizzy, she had nausea and vomiting. No evidence of blood. She had no diarrhea, instead she is constipated. She used a suppository today and had a BM here in the ER. She just in generally weak and deconditioned, with a poor appetite. Per her daughter the patient has some mild altered mentation. The daughter brought her back today after she developed stomach pains. History provided mainly by the daughter who is at the bedside but also by the patient.   Review of Systems:  The patient denies anorexia, fever, weight loss,, vision loss, decreased hearing, hoarseness, chest pain, syncope, dyspnea on exertion, peripheral edema, balance deficits, hemoptysis,  melena, hematochezia, severe indigestion/heartburn, hematuria, incontinence, genital sores, muscle weakness, suspicious skin lesions, transient blindness, difficulty walking, depression, unusual weight change, abnormal bleeding, enlarged lymph nodes, angioedema, and breast masses.  Past Medical History: Past Medical History  Diagnosis Date  . Diabetes mellitus   . Hyperlipidemia   . Osteoarthritis   . Osteoporosis   . Hypertension   . Obesity   . Upper GI bleed     AV malformation/when anticoag  . Sacroiliac joint dysfunction 04/01/2012   Past Surgical History  Procedure Date  . Appendectomy   . Cholecystectomy   . Colon resection 1988    secondary to cancer  . Polypectomy 1998    benign X 2  . Cervicitis 1964    conization   . Total knee arthroplasty 07/2009    right   . Endometrial polyp 05/2000    hyperplasia-laser treatment    Medications: Prior to Admission medications   Medication Sig Start  Date End Date Taking? Authorizing Provider  Ascorbic Acid (VITAMIN C) 500 MG tablet Take 500 mg by mouth daily.     Yes Historical Provider, MD  aspirin 325 MG EC tablet Take 325 mg by mouth daily.     Yes Historical Provider, MD  atorvastatin (LIPITOR) 10 MG tablet Take 10 mg by mouth daily.   Yes Historical Provider, MD  bisacodyl (LAXATIVE) 10 MG suppository Place 10 mg rectally daily as needed. For constipation   Yes Historical Provider, MD  calcium carbonate (OS-CAL) 600 MG TABS Take 600 mg by mouth 2 (two) times daily with a meal.     Yes Historical Provider, MD  cyclobenzaprine (FLEXERIL) 10 MG tablet Take 1 tablet (10 mg total) by mouth at bedtime as needed for muscle spasms. 04/01/12  Yes Mosie Lukes, MD  docusate sodium 100 MG CAPS Take 100 mg by mouth 2 (two) times daily as needed for constipation. 04/10/12  Yes Samuella Cota, MD  ferrous sulfate 325 (65 FE) MG tablet Take 325 mg by mouth daily with breakfast.   Yes Historical Provider, MD  imipramine (TOFRANIL) 50 MG tablet Take 100 mg by mouth at bedtime.   Yes Historical Provider, MD  metoprolol (LOPRESSOR) 50 MG tablet Take 1 tablet (50 mg total) by mouth 2 (two) times daily. 04/10/12  Yes Samuella Cota, MD  Multiple Vitamin (MULTIVITAMIN WITH MINERALS) TABS Take 1 tablet by mouth daily.   Yes Historical Provider, MD  Omega-3 Fatty Acids 300 MG CAPS Take 1 capsule by mouth 3 (three)  times daily.     Yes Historical Provider, MD  omeprazole (PRILOSEC) 20 MG capsule Take 20 mg by mouth daily.   Yes Historical Provider, MD  Polyethyl Glycol-Propyl Glycol (SYSTANE OP) Place 1 drop into both eyes as needed. For dry eyes   Yes Historical Provider, MD    Allergies:   Allergies  Allergen Reactions  . Diclofenac Sodium Other (See Comments)    : vomiting  . Fluoxetine Hcl Other (See Comments)     vomiting  . Shellfish Allergy Swelling    Social History:  reports that she quit smoking about 11 years ago. She has never used  smokeless tobacco. She reports that she does not drink alcohol or use illicit drugs. lives with son. Was used and a cane prior to her last admission on a walker. No sudden home oxygen. patient's daughter is Melonie [place] [phone]   Family History: Family History  Problem Relation Age of Onset  . Stroke Father   . Cancer Brother     lung    Physical Exam: Filed Vitals:   04/15/12 2231 04/15/12 2233 04/15/12 2234 04/16/12 0038  BP:  141/50 141/50 156/63  Pulse:  97 97 95  Temp:  98.3 F (36.8 C) 98.3 F (36.8 C) 98 F (36.7 C)  TempSrc:  Oral Oral Oral  Resp:  16 18 16   Height:   5' 10.5" (1.791 m)   Weight:   105.688 kg (233 lb)   SpO2: 96% 98% 99% 100%    General:  Alert and oriented times three, well developed and nourished, weak-appearing female Eyes: PERRLA, pink conjunctiva, no scleral icterus ENT: Moist oral mucosa, neck supple, no thyromegaly Lungs: clear to ascultation, no wheeze, no crackles, no use of accessory muscles Cardiovascular: regular rate and rhythm, no regurgitation, no gallops, no murmurs. No carotid bruits, no JVD Abdomen: soft, positive BS, non-tender, non-distended, no organomegaly, not an acute abdomen GU: not examined Neuro: CN II - XII grossly intact, sensation intact Musculoskeletal: strength 5/5 all extremities but overall diminished, no clubbing, cyanosis or edema Skin: no rash, no subcutaneous crepitation, no decubitus Psych: appropriate patient   Labs on Admission:   [facility] 04/15/12 2322  NA 126*  K 5.3*  CL 92*  CO2 16*  GLUCOSE 154*  BUN 26*  CREATININE 1.63*  CALCIUM 10.5  MG --  PHOS --    Basename 04/15/12 2322  AST 51*  ALT 43*  ALKPHOS 111  BILITOT 0.4  PROT 8.0  ALBUMIN 3.7    Basename 04/15/12 2322  LIPASE 120*  AMYLASE --    Basename 04/15/12 2322  WBC 15.2*  NEUTROABS 12.1*  HGB 13.7  HCT 42.1  MCV 83.2  PLT 208    Basename 04/15/12 2322  CKTOTAL --  CKMB --  CKMBINDEX --  TROPONINI <0.30     Micro Results: Recent Results (from the past 240 hour(s))  URINE CULTURE     Status: Normal   Collection Time   04/07/12  8:57 PM      Component Value Range Status Comment   Specimen Description URINE, CATHETERIZED   Final    Special Requests NONE   Final    Culture  Setup Time 04/07/2012 22:43   Final    Colony Count NO GROWTH   Final    Culture NO GROWTH   Final    Report Status 04/09/2012 FINAL   Final   CLOSTRIDIUM DIFFICILE BY PCR     Status: Normal   Collection Time  04/08/12  2:52 PM      Component Value Range Status Comment   C difficile by pcr NEGATIVE  NEGATIVE Final    Results for Erin [name], Erin [name] (MRN [id number]) as of 04/16/2012 03:44  Ref. Range 04/16/2012 00:03  Color, Urine Latest Range: YELLOW  AMBER (A)  APPearance Latest Range: CLEAR  CLOUDY (A)  Specific Gravity, Urine Latest Range: 1.005-1.030  1.024  pH Latest Range: 5.0-8.0  5.5  Glucose Latest Range: NEGATIVE mg/dL NEGATIVE  Bilirubin Urine Latest Range: NEGATIVE  MODERATE (A)  Ketones, ur Latest Range: NEGATIVE mg/dL 15 (A)  Protein Latest Range: NEGATIVE mg/dL 30 (A)  Urobilinogen, UA Latest Range: 0.0-1.0 mg/dL 1.0  Nitrite Latest Range: NEGATIVE  NEGATIVE  Leukocytes, UA Latest Range: NEGATIVE  NEGATIVE  Hgb urine dipstick Latest Range: NEGATIVE  NEGATIVE  Urine-Other No range found MUCOUS PRESENT  WBC, UA Latest Range: <3 WBC/hpf 3-6  RBC / HPF Latest Range: <3 RBC/hpf 0-2  Squamous Epithelial / LPF Latest Range: RARE  RARE  Bacteria, UA Latest Range: RARE  RARE  Casts Latest Range: NEGATIVE  HYALINE CASTS (A)    Radiological Exams on Admission: Ct Abdomen Pelvis Wo Contrast  04/16/2012  *RADIOLOGY REPORT*  Clinical Data: Abdominal pain, nausea, vomiting, weakness, dizziness, renal failure, hypertension  CT ABDOMEN AND PELVIS WITHOUT CONTRAST  Technique:  Multidetector CT imaging of the abdomen and pelvis was performed following the standard protocol without intravenous contrast. Sagittal and  coronal MPR images reconstructed from axial data set.  The patient drank dilute oral contrast for exam  Comparison: 07/14/2009  Findings: Lung bases appear mildly hyperaerated without infiltrate or effusion. Scattered atherosclerotic calcifications. Small nonobstructing bilateral renal calculi. Within limits of a nonenhanced exam, liver, spleen, pancreas, kidneys, and adrenal glands otherwise unremarkable. Unremarkable bladder, ureters, uterus, and adnexae. Gallbladder and appendix surgically absent by history.  Large and small bowel loops grossly unremarkable. Gastric antrum incompletely distended, unable to exclude wall thickening. Multiple ventral hernias are identified containing fat, largest infraumbilical with fascial defect measuring 4.1 x 4.9 cm. No mass, adenopathy, free fluid or inflammatory process. Degenerative disc disease changes lower lumbar spine.  IMPRESSION: No acute intra abdominal or intrapelvic abnormalities. Nonobstructing bilateral renal calculi. Scattered atherosclerotic disease. Multiple ventral hernias containing fat.   Original Report Authenticated By: Lavonia Dana, M.D.    Dg Chest 2 View  04/16/2012  *RADIOLOGY REPORT*  Clinical Data: Diffuse abdominal pain, nausea and vomiting.  CHEST - 2 VIEW  Comparison: 04/07/2012.  Findings: Normal sized heart.  Clear lungs.  The lungs remain mildly hyperexpanded.  Diffuse osteopenia and minimal thoracic spine degenerative changes.  IMPRESSION:  1.  No acute abnormality. 2.  Stable mild changes of COPD.   Original Report Authenticated By: Claudie Revering, M.D.     Assessment/Plan Present on Admission:  Acute and chronic kidney injury  . Dehydration Admit to telemetry Gentle IV fluid hydration Repeat BMP in a.m. Mild hyperkalemia See above  Leukocytosis Unclear etiology, await results of cultures  Hyponatremia Will check urine sodium, osmolarity, TSH, plasma osmolarity Repeat BMP in a.m., patient receiving IV fluid  hydration Weakness/deconditioning Consult physical therapy . HYPERLIPIDEMIA . OBESITY . HYPERTENSION, ESSENTIAL NOS  stable resume home medications  Full code DVT prophylaxis   Kee Drudge 04/16/2012, 3:43 AM

## 2012-04-16 NOTE — Progress Notes (Signed)
[facility] PROGRESS NOTE  Erin [name] [id number] DOB: 1929/03/22 DOA: 04/15/2012 PCP: [name], MD  Assessment/Plan  Acute and chronic kidney injury: Likely due to dehydration. - Continue IV fluid hydration  - F/u repeat BMP - Avoid nephrotoxins - Renally dose medications  Hyponatremia  - F/u urine sodium, osmolarity, TSH, plasma osmolarity  - F/u repeat BMP  Mild hyperkalemia: No peaked T-waves on EKG. - Hydrate - Repeat K  Metabolic acidosis, anion gap: Most likely due to ketosis from poor PO intake.  May be due to renal dysfunction, though this is less likely as her creatinine is only mildly elevated.  Lactic acidosis also a consideration. - Hydrate - Repeat BMP pending - If acidosis not improving with hydration, check lactic acid level  Leukocytosis: CXR negative.  UA negative. - F/u urine cx  HYPERTENSION, ESSENTIAL NOS: Elevated blood pressures, tending down. - Continue metoprolol - Add amlodipine prn if BP remains uncontrolled  HYPERLIPIDEMIA: Stable. - Continue statin  Constipation: Resolved.  OBESITY  Weakness/deconditioning  - Consult physical therapy   Diet:  Healthy heart Access:  PIV IVF:  NS @ 75 ml/hr Proph:  Lovenox   Code Status: DNR/DNI Family Communication: Spoke with patient Disposition Plan: Pending improvement in clinical status   Consultants:  None  Procedures:  CT abdomen/pelvis  Antibiotics:  No   HPI/Subjective: The patient states that her abdominal pain has resolved since have 2 BM's in the ED.  Her nausea has also improved.  She continues to feel a little weak.  Denies CP and SOB.  Objective: Filed Vitals:   04/16/12 0507 04/16/12 0543 04/16/12 0952 04/16/12 1232  BP: 180/52 160/62 163/49 135/69  Pulse: 94 84 93 75  Temp: 98.5 F (36.9 C) 98.4 F (36.9 C) 98.7 F (37.1 C) 98.1 F (36.7 C)  TempSrc: Oral Oral Oral Oral  Resp: 18 20 18 18   Height:  5' 10.5" (1.791 m)    Weight:  98.5 kg (217 lb  2.5 oz)    SpO2: 96% 100% 100% 99%    Intake/Output Summary (Last 24 hours) at 04/16/12 1653 Last data filed at 04/16/12 0547  Gross per 24 hour  Intake     75 ml  Output      0 ml  Net     75 ml   Filed Weights   04/15/12 2234 04/16/12 0543  Weight: 105.688 kg (233 lb) 98.5 kg (217 lb 2.5 oz)    Exam:   General:  NAD  HEENT:  Moist tongue  Cardiovascular:  RRR, no m/r/g  Respiratory:  CTA bilaterally  Abdomen:  NABS, soft, ND/NT  MSK:  Normal tone and bulk.  Neuro:  Grossly intact  Data Reviewed: Basic Metabolic Panel:  Lab [id number] 2322 04/10/12 0750  NA 126* 136  K 5.3* 3.7  CL 92* 103  CO2 16* 22  GLUCOSE 154* 121*  BUN 26* 19  CREATININE 1.63* 1.02  CALCIUM 10.5 9.5  MG -- --  PHOS -- --   Liver Function Tests:  Lab 04/15/12 2322  AST 51*  ALT 43*  ALKPHOS 111  BILITOT 0.4  PROT 8.0  ALBUMIN 3.7    Lab 04/15/12 2322  LIPASE 120*  AMYLASE --   No results found for this basename: AMMONIA:5 in the last 168 hours CBC:  Lab 04/16/12 1615 04/15/12 2322 04/10/12 0750  WBC 14.1* 15.2* 10.7*  NEUTROABS -- 12.1* --  HGB 12.3 13.7 12.3  HCT 37.1 42.1 38.0  MCV 83.4 83.2  83.5  PLT 195 208 182   Cardiac Enzymes:  Lab 04/15/12 2322  CKTOTAL --  CKMB --  CKMBINDEX --  TROPONINI <0.30   BNP (last 3 results) No results found for this basename: PROBNP:3 in the last 8760 hours CBG:  Lab 04/10/12 1702 04/10/12 1204 04/10/12 0808 04/09/12 2150 04/09/12 1705  GLUCAP 88 115* 130* 99 110*    Recent Results (from the past 240 hour(s))  URINE CULTURE     Status: Normal   Collection Time   04/07/12  8:57 PM      Component Value Range Status Comment   Specimen Description URINE, CATHETERIZED   Final    Special Requests NONE   Final    Culture  Setup Time 04/07/2012 22:43   Final    Colony Count NO GROWTH   Final    Culture NO GROWTH   Final    Report Status 04/09/2012 FINAL   Final   CLOSTRIDIUM DIFFICILE BY PCR     Status: Normal    Collection Time   04/08/12  2:52 PM      Component Value Range Status Comment   C difficile by pcr NEGATIVE  NEGATIVE Final      Studies: Ct Abdomen Pelvis Wo Contrast  04/16/2012  *RADIOLOGY REPORT*  Clinical Data: Abdominal pain, nausea, vomiting, weakness, dizziness, renal failure, hypertension  CT ABDOMEN AND PELVIS WITHOUT CONTRAST  Technique:  Multidetector CT imaging of the abdomen and pelvis was performed following the standard protocol without intravenous contrast. Sagittal and coronal MPR images reconstructed from axial data set.  The patient drank dilute oral contrast for exam  Comparison: 07/14/2009  Findings: Lung bases appear mildly hyperaerated without infiltrate or effusion. Scattered atherosclerotic calcifications. Small nonobstructing bilateral renal calculi. Within limits of a nonenhanced exam, liver, spleen, pancreas, kidneys, and adrenal glands otherwise unremarkable. Unremarkable bladder, ureters, uterus, and adnexae. Gallbladder and appendix surgically absent by history.  Large and small bowel loops grossly unremarkable. Gastric antrum incompletely distended, unable to exclude wall thickening. Multiple ventral hernias are identified containing fat, largest infraumbilical with fascial defect measuring 4.1 x 4.9 cm. No mass, adenopathy, free fluid or inflammatory process. Degenerative disc disease changes lower lumbar spine.  IMPRESSION: No acute intra abdominal or intrapelvic abnormalities. Nonobstructing bilateral renal calculi. Scattered atherosclerotic disease. Multiple ventral hernias containing fat.   Original Report Authenticated By: Lavonia Dana, M.D.    Dg Chest 2 View  04/16/2012  *RADIOLOGY REPORT*  Clinical Data: Diffuse abdominal pain, nausea and vomiting.  CHEST - 2 VIEW  Comparison: 04/07/2012.  Findings: Normal sized heart.  Clear lungs.  The lungs remain mildly hyperexpanded.  Diffuse osteopenia and minimal thoracic spine degenerative changes.  IMPRESSION:  1.  No acute  abnormality. 2.  Stable mild changes of COPD.   Original Report Authenticated By: Claudie Revering, M.D.     Scheduled Meds:   . aspirin  325 mg Oral Daily  . atorvastatin  10 mg Oral q1800  . enoxaparin (LOVENOX) injection  30 mg Subcutaneous Q24H  . imipramine  100 mg Oral QHS  . metoprolol  50 mg Oral BID  . omega-3 acid ethyl esters  1 g Oral Daily  . pantoprazole  40 mg Oral Daily   Continuous Infusions:   . sodium chloride 75 mL/hr at 04/16/12 0620    Principal Problem:  *Acute-on-chronic kidney injury Active Problems:  HYPERLIPIDEMIA  OBESITY  HYPERTENSION, ESSENTIAL NOS  Dehydration    Time spent: 30 min    Erin [name]  [facility] Pager [phone]. If 8PM-8AM, please contact night-coverage at [email or web address], password [facility] 04/16/2012, 4:53 PM  LOS: 1 day

## 2012-04-17 ENCOUNTER — Telehealth: Payer: Self-pay | Admitting: Family Medicine

## 2012-04-17 ENCOUNTER — Ambulatory Visit: Payer: Medicare Other | Admitting: Family Medicine

## 2012-04-17 DIAGNOSIS — E860 Dehydration: Secondary | ICD-10-CM | POA: Diagnosis not present

## 2012-04-17 DIAGNOSIS — N189 Chronic kidney disease, unspecified: Secondary | ICD-10-CM | POA: Diagnosis not present

## 2012-04-17 DIAGNOSIS — E119 Type 2 diabetes mellitus without complications: Secondary | ICD-10-CM

## 2012-04-17 DIAGNOSIS — K5900 Constipation, unspecified: Secondary | ICD-10-CM | POA: Diagnosis not present

## 2012-04-17 DIAGNOSIS — N179 Acute kidney failure, unspecified: Secondary | ICD-10-CM | POA: Diagnosis not present

## 2012-04-17 LAB — OSMOLALITY, URINE: Osmolality, Ur: 297 mOsm/kg — ABNORMAL LOW (ref 390–1090)

## 2012-04-17 LAB — BASIC METABOLIC PANEL
BUN: 19 mg/dL (ref 6–23)
CO2: 22 mEq/L (ref 19–32)
Sodium: 133 mEq/L — ABNORMAL LOW (ref 135–145)

## 2012-04-17 LAB — CBC
HCT: 36.8 % (ref 36.0–46.0)
Hemoglobin: 12.1 g/dL (ref 12.0–15.0)
MCH: 27.6 pg (ref 26.0–34.0)
MCHC: 32.9 g/dL (ref 30.0–36.0)

## 2012-04-17 LAB — URINE CULTURE

## 2012-04-17 MED ORDER — CYCLOBENZAPRINE HCL 10 MG PO TABS
5.0000 mg | ORAL_TABLET | Freq: Two times a day (BID) | ORAL | Status: DC | PRN
  Filled 2012-04-17: qty 1

## 2012-04-17 MED ORDER — POLYETHYLENE GLYCOL 3350 17 G PO PACK
17.0000 g | PACK | Freq: Two times a day (BID) | ORAL | Status: DC
  Administered 2012-04-17 – 2012-04-18 (×2): 17 g via ORAL
  Filled 2012-04-17 (×3): qty 1

## 2012-04-17 MED ORDER — POLYETHYLENE GLYCOL 3350 17 G PO PACK
17.0000 g | PACK | Freq: Every day | ORAL | Status: DC
Start: 2012-04-17 — End: 2012-04-17
  Administered 2012-04-17: 17 g via ORAL
  Filled 2012-04-17: qty 1

## 2012-04-17 MED ORDER — MAGNESIUM HYDROXIDE 400 MG/5ML PO SUSP: 30.0000 mL | Freq: Every day | ORAL | Status: DC | PRN

## 2012-04-17 MED ORDER — SENNOSIDES-DOCUSATE SODIUM 8.6-50 MG PO TABS
1.0000 | ORAL_TABLET | Freq: Two times a day (BID) | ORAL | Status: DC
  Administered 2012-04-17 – 2012-04-18 (×3): 1 via ORAL
  Filled 2012-04-17 (×4): qty 1

## 2012-04-17 MED ORDER — ENOXAPARIN SODIUM 40 MG/0.4ML ~~LOC~~ SOLN
40.0000 mg | SUBCUTANEOUS | Status: DC
  Administered 2012-04-18: 40 mg via SUBCUTANEOUS
  Filled 2012-04-17: qty 0.4

## 2012-04-17 MED ORDER — AMLODIPINE BESYLATE 5 MG PO TABS
5.0000 mg | ORAL_TABLET | Freq: Every day | ORAL | Status: DC
  Administered 2012-04-17 – 2012-04-18 (×2): 5 mg via ORAL
  Filled 2012-04-17 (×3): qty 1

## 2012-04-17 NOTE — Progress Notes (Signed)
Physical Therapy Treatment Patient Details Name: Erin [name] MRN: [id number] DOB: December 17, 1928 Today's Date: 04/17/2012 Time: [id number] PT Time Calculation (min): 23 min  PT Assessment / Plan / Recommendation Comments on Treatment Session  Pt N/V and acute kidney injury.  Pt making slow, steady progress.    Follow Up Recommendations  CIR     Does the patient have the potential to tolerate intense rehabilitation     Barriers to Discharge        Equipment Recommendations  None recommended by PT    Recommendations for Other Services    Frequency Min 3X/week   Plan Discharge plan remains appropriate;Frequency remains appropriate    Precautions / Restrictions Precautions Precautions: Fall   Pertinent Vitals/Pain No c/o's    Mobility  Bed Mobility Supine to Sit: 4: Min guard;HOB elevated;With rails Sitting - Scoot to Edge of Bed: 4: Min guard Details for Bed Mobility Assistance: Incr time needed. Transfers Sit to Stand: 3: Mod assist;With upper extremity assist;With armrests;From bed;From chair/3-in-1 Stand to Sit: 4: Min assist;With upper extremity assist;With armrests;To chair/3-in-1 Details for Transfer Assistance: Verbal/tactile cues for hand placement.  Assist to bring hips up and extend trunk. Ambulation/Gait Ambulation/Gait Assistance: 4: Min assist Ambulation Distance (Feet): 15 Feet (15' x 1, 6' x 1) Assistive device: Rolling walker Ambulation/Gait Assistance Details: Verbal cues to stand more erect and to stay closer to walker. Gait Pattern: Step-through pattern;Decreased step length - right;Decreased step length - left;Shuffle;Trunk flexed Gait velocity: decr    Exercises     PT Diagnosis:    PT Problem List:   PT Treatment Interventions:     PT Goals Acute Rehab PT Goals PT Goal: Supine/Side to Sit - Progress: Progressing toward goal PT Goal: Sit to Stand - Progress: Progressing toward goal PT Goal: Stand to Sit - Progress: Progressing toward goal PT  Goal: Ambulate - Progress: Progressing toward goal  Visit Information  Last PT Received On: 04/24/12 Assistance Needed: +2 (helpful)    Subjective Data  Subjective: Pt stated she hadn't been up to chair today.   Cognition  Overall Cognitive Status: Appears within functional limits for tasks assessed/performed Arousal/Alertness: Awake/alert Orientation Level: Appears intact for tasks assessed Behavior During Session: [facility] for tasks performed    Balance  Static Standing Balance Static Standing - Balance Support: Bilateral upper extremity supported (on walker) Static Standing - Level of Assistance: 4: Min assist  End of Session PT - End of Session Equipment Utilized During Treatment: Gait belt Activity Tolerance: Patient limited by fatigue Patient left: in chair;with call bell/phone within reach;with family/visitor present   GP     [facility] 04/17/2012, 3:46 PM  [facility] PT [phone]

## 2012-04-17 NOTE — Telephone Encounter (Signed)
Niece of patient would like to talk to you about her condition before she is released from the hospital.

## 2012-04-17 NOTE — Telephone Encounter (Signed)
I called her home and left a message -asked her to call me back with her work extension number

## 2012-04-17 NOTE — Telephone Encounter (Signed)
Guss Bunde (pt's daughter) also had home # listed in chart [phone], since couldn't get in contact with daughter on work #

## 2012-04-17 NOTE — Progress Notes (Signed)
[facility] PROGRESS NOTE  Erin [name] [id number] DOB: 1928/08/17 DOA: 04/15/2012 PCP: [name], MD  Assessment/Plan   Constipation:  Likely this is the cause of many of the other problems that the patient presented with.  It likely led to her feeling poorly, not eating and drinking as well, followed by electrolyte derangement and confusion.  Advised patient and family members that she should NOT allow herself to become constipated and she should take medication to have at least 2 BM daily.  Minimize constipating medications (narcotics, imipramine, BB if possible).  Defer to PCP.   -  miralax BID -  Senna/colace 1tab po BID -  Milk of magnesia daily if no BM -  Bisacodyl suppository daily if no BM   Acute and chronic kidney injury: Likely due to dehydration and resolving - Avoid nephrotoxins - Renally dose medications  Hyponatremia, resolving and patient near her baseline sodium level.  May have a component of reset osmostat, plus medication side effect.   - F/u urine sodium, osmolarity, TSH, plasma osmolarity  - F/u repeat BMP  Mild hyperkalemia, resolved: No peaked T-waves on EKG.  Metabolic acidosis, anion gap: Most likely due to ketosis from poor PO intake.  May be due to renal dysfunction, though this is less likely as her creatinine is only mildly elevated.  Lactic acidosis also a consideration.  Resolved with IVF.    Leukocytosis: CXR negative.  UA negative. - Urine culture neg.  HYPERTENSION, ESSENTIAL NOS: Elevated blood pressures, tending down. - Continue metoprolol - Add amlodipine   HYPERLIPIDEMIA: Stable. - Continue statin  OBESITY  Sciatica:  States that her pain is better with flexeril.  Has outpatient follow up for sciatica -  Will add flexeril 5mg  BID prn pain -  Continue narcotics for now -  Consider transitioning to gabapentin  Weakness/deconditioning  - physical therapy recommending rehab - Physical medicine and rehabilitation  pending  Diet:  Healthy heart Access:  PIV IVF:  NS @ 75 ml/hr Proph:  Lovenox   Code Status: DNR/DNI Family Communication: Spoke with patient and niece, [name] Disposition Plan: Pending BM and assessment by PM&R   Consultants:  PM&R  Procedures:  CT abdomen/pelvis  Antibiotics:  No   HPI/Subjective: The patient states that her abdominal pain has resolved.  She continues to feel a little weak.  Denies CP and SOB, nausea, vomiting.  She has not had any further bowel movements since yesterday morning.  Pain that radiates down the right buttock along the lateral aspect of her leg to her foot, worse with hip frlexion and movement.  Objective: Filed Vitals:   04/16/12 1725 04/16/12 2120 04/17/12 0522 04/17/12 1012  BP: 142/55 133/49 146/69 152/50  Pulse: 73 82 73 80  Temp: 98.5 F (36.9 C) 98.7 F (37.1 C) 98.3 F (36.8 C) 98.4 F (36.9 C)  TempSrc: Oral Oral Oral Oral  Resp: 18 18 18 18   Height:      Weight:  99.5 kg (219 lb 5.7 oz)    SpO2: 100% 100% 100% 97%    Intake/Output Summary (Last 24 hours) at 04/17/12 1342 Last data filed at 04/17/12 1100  Gross per 24 hour  Intake   1940 ml  Output   3000 ml  Net  -1060 ml   Filed Weights   04/15/12 2234 04/16/12 0543 04/16/12 2120  Weight: 105.688 kg (233 lb) 98.5 kg (217 lb 2.5 oz) 99.5 kg (219 lb 5.7 oz)    Exam:   General:  NAD  HEENT:  Moist tongue  Cardiovascular:  RRR, no m/r/g  Respiratory:  CTA bilaterally  Abdomen:  NABS, soft, mildly distended, nontender  MSK:  Normal tone and bulk.  Pain that worsens with range of motion of the right hip.    Neuro:  Grossly intact  Data Reviewed: Basic Metabolic Panel:  Lab [id number] 0600 04/16/12 1615 04/15/12 2322  NA 133* 129* 126*  K 4.5 5.1 5.3*  CL 101 98 92*  CO2 22 19 16*  GLUCOSE 109* 122* 154*  BUN 19 23 26*  CREATININE 1.18* 1.30* 1.63*  CALCIUM 9.7 9.7 10.5  MG -- -- --  PHOS -- -- --   Liver Function Tests:  Lab 04/15/12 2322   AST 51*  ALT 43*  ALKPHOS 111  BILITOT 0.4  PROT 8.0  ALBUMIN 3.7    Lab 04/15/12 2322  LIPASE 120*  AMYLASE --   No results found for this basename: AMMONIA:5 in the last 168 hours CBC:  Lab 04/17/12 0600 04/16/12 1615 04/15/12 2322  WBC 10.5 14.1* 15.2*  NEUTROABS -- -- 12.1*  HGB 12.1 12.3 13.7  HCT 36.8 37.1 42.1  MCV 83.8 83.4 83.2  PLT 183 195 208   Cardiac Enzymes:  Lab 04/15/12 2322  CKTOTAL --  CKMB --  CKMBINDEX --  TROPONINI <0.30   BNP (last 3 results) No results found for this basename: PROBNP:3 in the last 8760 hours CBG:  Lab 04/10/12 1702  GLUCAP 88    Recent Results (from the past 240 hour(s))  URINE CULTURE     Status: Normal   Collection Time   04/07/12  8:57 PM      Component Value Range Status Comment   Specimen Description URINE, CATHETERIZED   Final    Special Requests NONE   Final    Culture  Setup Time 04/07/2012 22:43   Final    Colony Count NO GROWTH   Final    Culture NO GROWTH   Final    Report Status 04/09/2012 FINAL   Final   CLOSTRIDIUM DIFFICILE BY PCR     Status: Normal   Collection Time   04/08/12  2:52 PM      Component Value Range Status Comment   C difficile by pcr NEGATIVE  NEGATIVE Final   URINE CULTURE     Status: Normal   Collection Time   04/16/12 12:03 AM      Component Value Range Status Comment   Specimen Description URINE, RANDOM   Final    Special Requests NONE   Final    Culture  Setup Time 04/16/2012 13:12   Final    Colony Count NO GROWTH   Final    Culture NO GROWTH   Final    Report Status 04/17/2012 FINAL   Final      Studies: Ct Abdomen Pelvis Wo Contrast  04/16/2012  *RADIOLOGY REPORT*  Clinical Data: Abdominal pain, nausea, vomiting, weakness, dizziness, renal failure, hypertension  CT ABDOMEN AND PELVIS WITHOUT CONTRAST  Technique:  Multidetector CT imaging of the abdomen and pelvis was performed following the standard protocol without intravenous contrast. Sagittal and coronal MPR images  reconstructed from axial data set.  The patient drank dilute oral contrast for exam  Comparison: 07/14/2009  Findings: Lung bases appear mildly hyperaerated without infiltrate or effusion. Scattered atherosclerotic calcifications. Small nonobstructing bilateral renal calculi. Within limits of a nonenhanced exam, liver, spleen, pancreas, kidneys, and adrenal glands otherwise unremarkable. Unremarkable bladder, ureters, uterus, and adnexae. Gallbladder and  appendix surgically absent by history.  Large and small bowel loops grossly unremarkable. Gastric antrum incompletely distended, unable to exclude wall thickening. Multiple ventral hernias are identified containing fat, largest infraumbilical with fascial defect measuring 4.1 x 4.9 cm. No mass, adenopathy, free fluid or inflammatory process. Degenerative disc disease changes lower lumbar spine.  IMPRESSION: No acute intra abdominal or intrapelvic abnormalities. Nonobstructing bilateral renal calculi. Scattered atherosclerotic disease. Multiple ventral hernias containing fat.   Original Report Authenticated By: Lavonia Dana, M.D.    Dg Chest 2 View  04/16/2012  *RADIOLOGY REPORT*  Clinical Data: Diffuse abdominal pain, nausea and vomiting.  CHEST - 2 VIEW  Comparison: 04/07/2012.  Findings: Normal sized heart.  Clear lungs.  The lungs remain mildly hyperexpanded.  Diffuse osteopenia and minimal thoracic spine degenerative changes.  IMPRESSION:  1.  No acute abnormality. 2.  Stable mild changes of COPD.   Original Report Authenticated By: Claudie Revering, M.D.     Scheduled Meds:    . amLODipine  5 mg Oral Daily  . aspirin  325 mg Oral Daily  . atorvastatin  10 mg Oral q1800  . enoxaparin (LOVENOX) injection  30 mg Subcutaneous Q24H  . imipramine  100 mg Oral QHS  . metoprolol  50 mg Oral BID  . omega-3 acid ethyl esters  1 g Oral Daily  . pantoprazole  40 mg Oral Daily  . polyethylene glycol  17 g Oral Daily  . senna-docusate  1 tablet Oral BID    Continuous Infusions:    . sodium chloride 75 mL/hr at 04/17/12 [id number]    Principal Problem:  *Acute-on-chronic kidney injury Active Problems:  HYPERLIPIDEMIA  OBESITY  HYPERTENSION, ESSENTIAL NOS  Dehydration    Time spent: 30 min    Bettejane Leavens, [facility]  [facility] Pager [phone]. If 8PM-8AM, please contact night-coverage at [email or web address], password [facility] 04/17/2012, 1:42 PM  LOS: 2 days

## 2012-04-17 NOTE — Progress Notes (Signed)
Rehab Admissions Coordinator Note:  Patient was screened by Retta Diones for appropriateness for an Inpatient Acute Rehab Consult.  Noted PT recommending possible need for CIR.  At this time, we are recommending Inpatient Rehab consult.  Retta Diones 04/17/2012, 8:46 AM  I can be reached at [phone].

## 2012-04-17 NOTE — Telephone Encounter (Signed)
I tried calling her - but her work number only gives me an option to choose an extension or dept -and I do not know what to choose ... When I dial [phone] for operator , I am not getting anyone

## 2012-04-18 ENCOUNTER — Inpatient Hospital Stay (HOSPITAL_COMMUNITY)
Admission: RE | Admit: 2012-04-18 | Discharge: 2012-04-25 | DRG: 945 | Disposition: A | Discharge status: Home Health | Payer: Medicare PPO | Source: Intra-hospital | Attending: Physical Medicine & Rehabilitation | Admitting: Physical Medicine & Rehabilitation

## 2012-04-18 DIAGNOSIS — K5900 Constipation, unspecified: Secondary | ICD-10-CM | POA: Diagnosis not present

## 2012-04-18 DIAGNOSIS — E871 Hypo-osmolality and hyponatremia: Secondary | ICD-10-CM

## 2012-04-18 DIAGNOSIS — G8929 Other chronic pain: Secondary | ICD-10-CM

## 2012-04-18 DIAGNOSIS — M810 Age-related osteoporosis without current pathological fracture: Secondary | ICD-10-CM | POA: Diagnosis not present

## 2012-04-18 DIAGNOSIS — R5381 Other malaise: Secondary | ICD-10-CM | POA: Diagnosis not present

## 2012-04-18 DIAGNOSIS — Z96659 Presence of unspecified artificial knee joint: Secondary | ICD-10-CM

## 2012-04-18 DIAGNOSIS — Z5189 Encounter for other specified aftercare: Secondary | ICD-10-CM | POA: Diagnosis present

## 2012-04-18 DIAGNOSIS — E860 Dehydration: Secondary | ICD-10-CM | POA: Diagnosis not present

## 2012-04-18 DIAGNOSIS — Z79899 Other long term (current) drug therapy: Secondary | ICD-10-CM | POA: Diagnosis not present

## 2012-04-18 DIAGNOSIS — A4189 Other specified sepsis: Secondary | ICD-10-CM

## 2012-04-18 DIAGNOSIS — Z87891 Personal history of nicotine dependence: Secondary | ICD-10-CM | POA: Diagnosis not present

## 2012-04-18 DIAGNOSIS — N179 Acute kidney failure, unspecified: Secondary | ICD-10-CM | POA: Diagnosis not present

## 2012-04-18 DIAGNOSIS — E785 Hyperlipidemia, unspecified: Secondary | ICD-10-CM

## 2012-04-18 DIAGNOSIS — N189 Chronic kidney disease, unspecified: Secondary | ICD-10-CM | POA: Diagnosis not present

## 2012-04-18 DIAGNOSIS — I129 Hypertensive chronic kidney disease with stage 1 through stage 4 chronic kidney disease, or unspecified chronic kidney disease: Secondary | ICD-10-CM | POA: Diagnosis not present

## 2012-04-18 DIAGNOSIS — E119 Type 2 diabetes mellitus without complications: Secondary | ICD-10-CM | POA: Diagnosis not present

## 2012-04-18 DIAGNOSIS — M549 Dorsalgia, unspecified: Secondary | ICD-10-CM

## 2012-04-18 DIAGNOSIS — Z7982 Long term (current) use of aspirin: Secondary | ICD-10-CM | POA: Diagnosis not present

## 2012-04-18 DIAGNOSIS — E669 Obesity, unspecified: Secondary | ICD-10-CM

## 2012-04-18 DIAGNOSIS — N390 Urinary tract infection, site not specified: Secondary | ICD-10-CM | POA: Diagnosis not present

## 2012-04-18 LAB — CREATININE, SERUM
Creatinine, Ser: 1.02 mg/dL (ref 0.50–1.10)
GFR calc Af Amer: 58 mL/min — ABNORMAL LOW (ref >90)
GFR calc non Af Amer: 50 mL/min — ABNORMAL LOW (ref >90)

## 2012-04-18 LAB — CBC
Hemoglobin: 11.9 g/dL — ABNORMAL LOW (ref 12.0–15.0)
MCHC: 33 g/dL (ref 30.0–36.0)
Platelets: 176 10*3/uL (ref 150–400)
RBC: 4.38 MIL/uL (ref 3.87–5.11)
RDW: 14.3 % (ref 11.5–15.5)
WBC: 8.8 10*3/uL (ref 4.0–10.5)

## 2012-04-18 LAB — BASIC METABOLIC PANEL
GFR calc Af Amer: 60 mL/min — ABNORMAL LOW (ref >90)
GFR calc non Af Amer: 51 mL/min — ABNORMAL LOW (ref >90)
Potassium: 4.1 mEq/L (ref 3.5–5.1)
Sodium: 132 mEq/L — ABNORMAL LOW (ref 135–145)

## 2012-04-18 MED ORDER — SENNOSIDES-DOCUSATE SODIUM 8.6-50 MG PO TABS
1.0000 | ORAL_TABLET | Freq: Two times a day (BID) | ORAL | Status: DC
  Administered 2012-04-18 – 2012-04-25 (×11): 1 via ORAL
  Filled 2012-04-18 (×17): qty 1

## 2012-04-18 MED ORDER — MAGNESIUM CITRATE PO SOLN
1.0000 | Freq: Once | ORAL | Status: AC
  Administered 2012-04-18: 1 via ORAL
  Filled 2012-04-18: qty 296

## 2012-04-18 MED ORDER — CYCLOBENZAPRINE HCL 10 MG PO TABS: 5.0000 mg | ORAL_TABLET | Freq: Two times a day (BID) | ORAL | Status: DC | PRN

## 2012-04-18 MED ORDER — ALUM & MAG HYDROXIDE-SIMETH 200-200-20 MG/5ML PO SUSP: 30.0000 mL | Freq: Four times a day (QID) | ORAL | Status: DC | PRN

## 2012-04-18 MED ORDER — ATORVASTATIN CALCIUM 10 MG PO TABS
10.0000 mg | ORAL_TABLET | Freq: Every day | ORAL | Status: DC
  Administered 2012-04-20 – 2012-04-24 (×5): 10 mg via ORAL
  Filled 2012-04-18 (×7): qty 1

## 2012-04-18 MED ORDER — OMEGA-3-ACID ETHYL ESTERS 1 G PO CAPS
1.0000 g | ORAL_CAPSULE | Freq: Every day | ORAL | Status: DC
  Administered 2012-04-19 – 2012-04-25 (×7): 1 g via ORAL
  Filled 2012-04-18 (×9): qty 1

## 2012-04-18 MED ORDER — AMLODIPINE BESYLATE 5 MG PO TABS: 5.0000 mg | ORAL_TABLET | Freq: Every day | ORAL | Status: DC

## 2012-04-18 MED ORDER — MAGNESIUM CITRATE PO SOLN: 296.0000 mL | ORAL | Status: DC | PRN

## 2012-04-18 MED ORDER — POLYETHYLENE GLYCOL 3350 17 G PO PACK
17.0000 g | PACK | Freq: Two times a day (BID) | ORAL | Status: DC
  Administered 2012-04-18 – 2012-04-25 (×13): 17 g via ORAL
  Filled 2012-04-18 (×17): qty 1

## 2012-04-18 MED ORDER — ENOXAPARIN SODIUM 40 MG/0.4ML ~~LOC~~ SOLN: 40.0000 mg | SUBCUTANEOUS | Status: DC

## 2012-04-18 MED ORDER — POLYETHYLENE GLYCOL 3350 17 G PO PACK: 17.0000 g | PACK | Freq: Two times a day (BID) | ORAL | Status: DC

## 2012-04-18 MED ORDER — IMIPRAMINE HCL 50 MG PO TABS
100.0000 mg | ORAL_TABLET | Freq: Every day | ORAL | Status: DC
  Administered 2012-04-18 – 2012-04-22 (×5): 100 mg via ORAL
  Filled 2012-04-18 (×9): qty 2

## 2012-04-18 MED ORDER — ONDANSETRON HCL 4 MG/2ML IJ SOLN: 4.0000 mg | Freq: Four times a day (QID) | INTRAMUSCULAR | Status: DC | PRN

## 2012-04-18 MED ORDER — CYCLOBENZAPRINE HCL 5 MG PO TABS: 5.0000 mg | ORAL_TABLET | Freq: Two times a day (BID) | ORAL | Status: DC | PRN

## 2012-04-18 MED ORDER — PANTOPRAZOLE SODIUM 40 MG PO TBEC
40.0000 mg | DELAYED_RELEASE_TABLET | Freq: Every day | ORAL | Status: DC
  Administered 2012-04-19 – 2012-04-25 (×7): 40 mg via ORAL
  Filled 2012-04-18 (×9): qty 1

## 2012-04-18 MED ORDER — ASPIRIN EC 325 MG PO TBEC
325.0000 mg | DELAYED_RELEASE_TABLET | Freq: Every day | ORAL | Status: DC
  Administered 2012-04-19 – 2012-04-25 (×7): 325 mg via ORAL
  Filled 2012-04-18 (×9): qty 1

## 2012-04-18 MED ORDER — ONDANSETRON HCL 4 MG PO TABS: 4.0000 mg | ORAL_TABLET | Freq: Four times a day (QID) | ORAL | Status: DC | PRN

## 2012-04-18 MED ORDER — METOPROLOL TARTRATE 50 MG PO TABS
50.0000 mg | ORAL_TABLET | Freq: Two times a day (BID) | ORAL | Status: DC
  Administered 2012-04-18 – 2012-04-25 (×14): 50 mg via ORAL
  Filled 2012-04-18 (×17): qty 1

## 2012-04-18 MED ORDER — HYDROCODONE-ACETAMINOPHEN 5-325 MG PO TABS
1.0000 | ORAL_TABLET | Freq: Four times a day (QID) | ORAL | Status: DC | PRN
  Administered 2012-04-19 – 2012-04-25 (×8): 1 via ORAL
  Filled 2012-04-18 (×9): qty 1

## 2012-04-18 MED ORDER — SORBITOL 70 % SOLN
30.0000 mL | Freq: Every day | Status: DC | PRN
  Administered 2012-04-20: 30 mL via ORAL
  Filled 2012-04-18: qty 30

## 2012-04-18 MED ORDER — ACETAMINOPHEN 325 MG PO TABS: 325.0000 mg | ORAL_TABLET | ORAL | Status: DC | PRN

## 2012-04-18 MED ORDER — AMLODIPINE BESYLATE 5 MG PO TABS
5.0000 mg | ORAL_TABLET | Freq: Every day | ORAL | Status: DC
  Administered 2012-04-19 – 2012-04-25 (×7): 5 mg via ORAL
  Filled 2012-04-18 (×9): qty 1

## 2012-04-18 MED ORDER — SENNOSIDES-DOCUSATE SODIUM 8.6-50 MG PO TABS: 2.0000 | ORAL_TABLET | Freq: Two times a day (BID) | ORAL | Status: DC

## 2012-04-18 MED ORDER — BISACODYL 10 MG RE SUPP: 10.0000 mg | Freq: Every day | RECTAL | Status: DC | PRN

## 2012-04-18 MED ORDER — ENOXAPARIN SODIUM 40 MG/0.4ML ~~LOC~~ SOLN
40.0000 mg | SUBCUTANEOUS | Status: DC
  Administered 2012-04-19 – 2012-04-25 (×7): 40 mg via SUBCUTANEOUS
  Filled 2012-04-18 (×7): qty 0.4

## 2012-04-18 NOTE — Discharge Summary (Signed)
Physician Discharge Summary  Erin [name] [id number] DOB: 1929-03-30 DOA: 04/15/2012  PCP: [name], MD  Admit date: 04/15/2012 Discharge date: 04/18/2012  Recommendations for Outpatient Follow-up:  To Inpatient rehab for ongoing care PCP:  Minimize constipating medications where possible.  Make sure bowel regimen is working  Discharge Diagnoses:  Principal Problem:  *Acute-on-chronic kidney injury Active Problems:  HYPERLIPIDEMIA  OBESITY  HYPERTENSION, ESSENTIAL NOS  Dehydration   Discharge Condition: stable, improved  Diet recommendation: healthy heart, high fiber.  Drink plenty of water.    Wt Readings from Last 3 Encounters:  04/17/12 99.5 kg (219 lb 5.7 oz)  04/10/12 107.7 kg (237 lb 7 oz)  04/01/12 106.142 kg (234 lb)    History of present illness:   This is a 79 year old female who was recently admitted here in discharge after 3 days, her admission diagnoses was a UTI. She completed her antibiotics, however she's continued not to feel well. Today she developed some stomach pains, stating that "she could not bear''. She was dizzy, she had nausea and vomiting. No evidence of blood. She had no diarrhea, instead she is constipated. She used a suppository today and had a BM here in the ER. She just in generally weak and deconditioned, with a poor appetite. Per her daughter the patient has some mild altered mentation. The daughter brought her back today after she developed stomach pains. History provided mainly by the daughter who is at the bedside but also by the patient.    Hospital Course:   Constipation: Likely this is the cause of many of the other problems that the patient presented with. It likely led to her feeling poorly, not eating and drinking as well, followed by electrolyte derangement and confusion. Advised patient and family members that she should NOT allow herself to become constipated and she should take medication to have at least 2 BM daily. Minimize  constipating medications (narcotics, imipramine, BB if possible). Defer to PCP.  She should take miralax BID, senna/colace 2 tabs BID with bisacodyl suppository in the evening if no BM during the day and magnesium citrate the following morning if still no BM.  If she needs to use the magnesium citrate, please increase miralax to 34gms BID and titrate to having two to three soft BMs daily.     Acute and chronic kidney injury: Likely due to dehydration and resolved with IVF.    Hyponatremia, resolved to patient's baseline sodium level of low 130s after hydration.  May have a component of reset osmostat, plus medication side effect.  TSH wnl.   Mild hyperkalemia from dehydration: No peaked T-waves on EKG and resolved with IVF Metabolic acidosis, anion gap: Most likely due to ketosis from poor PO intake. May be due to renal dysfunction, though this is less likely as her creatinine was only mildly elevated. Lactic acidosis was also a consideration but less likely. Resolved with IVF.  Leukocytosis: CXR negative. UA and UCx negative.  Likely from constipation and dehydration  HYPERTENSION, ESSENTIAL NOS: Elevated blood pressures, tending down.  Continue metoprolol and added amlodipine which may need to be dose adjusted as an outpatient because BPs are still mildly elevated.    HYPERLIPIDEMIA: Stable. Continued statin  OBESITY:  Encouraged high fiber, healthy heart diet  Sciatica: States that her pain is better with flexeril. Has outpatient follow up for sciatica.  Added flexeril 5mg  po BID prn pain to her narcotic regimen which seemed to help.  Consider gabapentin as outpatient.    Weakness/deconditioning:  To inpatient rehab  Consultants:  PM&R Procedures:  CT abdomen/pelvis Antibiotics:  No   Discharge Exam: Filed Vitals:   04/18/12 1338  BP: 126/75  Pulse: 83  Temp: 98.6 F (37 C)  Resp: 18   Filed Vitals:   04/17/12 2049 04/18/12 0454 04/18/12 0926 04/18/12 1338  BP: 142/51 147/55  145/72 126/75  Pulse: 74 84 87 83  Temp: 97.6 F (36.4 C) 97.8 F (36.6 C) 97.7 F (36.5 C) 98.6 F (37 C)  TempSrc: Oral Oral Oral Oral  Resp: 20 21 18 18   Height: 5' 10.5" (1.791 m)     Weight: 99.5 kg (219 lb 5.7 oz)     SpO2: 100% 96% 99% 96%   General: NAD  HEENT: Moist tongue  Cardiovascular: RRR, no m/r/g  Respiratory: CTA bilaterally  Abdomen: NABS, soft, mildly distended, nontender  MSK: Normal tone and bulk. Pain that worsens with range of motion of the right hip.  Neuro: Grossly intact  Discharge Instructions      Discharge Orders    Future Appointments: Provider: Department: Dept Phone: Center:   06/28/2012 10:00 AM [id number]   07/05/2012 2:30 PM Abner Greenspan, MD [facility] [phone] [id number]     Future Orders Please Complete By Expires   Diet - low sodium heart healthy      Comments:   High fiber   Increase activity slowly      Discharge instructions      Comments:   Take miralax BID, senna/colace 2 tabs BID with bisacodyl suppository in the evening if no BM during the day and magnesium citrate the following morning if still no BM.  If you need to use the magnesium citrate, please increase the miralax to 34gms BID and may increase as needed until you have two to three soft BMs daily.  Please stay hydrated and eat a high fiber diet.   Call MD for:  temperature >100.4      Call MD for:  persistant nausea and vomiting      Call MD for:  severe uncontrolled pain      Call MD for:  difficulty breathing, headache or visual disturbances      Call MD for:  hives      Call MD for:  persistant dizziness or light-headedness      Call MD for:  extreme fatigue          Medication List     As of 04/18/2012  1:47 PM    STOP taking these medications         DSS 100 MG Caps      TAKE these medications         amLODipine 5 MG tablet   Commonly known as: NORVASC   Take 1  tablet (5 mg total) by mouth daily.      aspirin 325 MG EC tablet   Take 325 mg by mouth daily.      atorvastatin 10 MG tablet   Commonly known as: LIPITOR   Take 10 mg by mouth daily.      calcium carbonate 600 MG Tabs   Commonly known as: OS-CAL   Take 600 mg by mouth 2 (two) times daily with a meal.      cyclobenzaprine 10 MG tablet   Commonly known as: FLEXERIL   Take 1 tablet (10 mg total) by mouth at bedtime as needed for muscle spasms.  cyclobenzaprine 5 MG tablet   Commonly known as: FLEXERIL   Take 1 tablet (5 mg total) by mouth 2 (two) times daily as needed for muscle spasms.      ferrous sulfate 325 (65 FE) MG tablet   Take 325 mg by mouth daily with breakfast.      imipramine 50 MG tablet   Commonly known as: TOFRANIL   Take 100 mg by mouth at bedtime.      LAXATIVE 10 MG suppository   Generic drug: bisacodyl   Place 10 mg rectally daily as needed. For constipation      magnesium citrate solution   Take 296 mLs by mouth every other day as needed (severe constipation).      metoprolol 50 MG tablet   Commonly known as: LOPRESSOR   Take 1 tablet (50 mg total) by mouth 2 (two) times daily.      multivitamin with minerals Tabs   Take 1 tablet by mouth daily.      Omega-3 Fatty Acids 300 MG Caps   Take 1 capsule by mouth 3 (three) times daily.      omeprazole 20 MG capsule   Commonly known as: PRILOSEC   Take 20 mg by mouth daily.      polyethylene glycol packet   Commonly known as: MIRALAX / GLYCOLAX   Take 17 g by mouth 2 (two) times daily.      senna-docusate 8.6-50 MG per tablet   Commonly known as: Senokot-S   Take 2 tablets by mouth 2 (two) times daily.      SYSTANE OP   Place 1 drop into both eyes as needed. For dry eyes      vitamin C 500 MG tablet   Commonly known as: ASCORBIC ACID   Take 500 mg by mouth daily.        Follow-up Information    Follow up with [name], MD. Call in 2 weeks. (f/u constipation)    Contact  information:   [address] [place] [address] [phone]           The results of significant diagnostics from this hospitalization (including imaging, microbiology, ancillary and laboratory) are listed below for reference.    Significant Diagnostic Studies: Ct Abdomen Pelvis Wo Contrast  04/16/2012  *RADIOLOGY REPORT*  Clinical Data: Abdominal pain, nausea, vomiting, weakness, dizziness, renal failure, hypertension  CT ABDOMEN AND PELVIS WITHOUT CONTRAST  Technique:  Multidetector CT imaging of the abdomen and pelvis was performed following the standard protocol without intravenous contrast. Sagittal and coronal MPR images reconstructed from axial data set.  The patient drank dilute oral contrast for exam  Comparison: 07/14/2009  Findings: Lung bases appear mildly hyperaerated without infiltrate or effusion. Scattered atherosclerotic calcifications. Small nonobstructing bilateral renal calculi. Within limits of a nonenhanced exam, liver, spleen, pancreas, kidneys, and adrenal glands otherwise unremarkable. Unremarkable bladder, ureters, uterus, and adnexae. Gallbladder and appendix surgically absent by history.  Large and small bowel loops grossly unremarkable. Gastric antrum incompletely distended, unable to exclude wall thickening. Multiple ventral hernias are identified containing fat, largest infraumbilical with fascial defect measuring 4.1 x 4.9 cm. No mass, adenopathy, free fluid or inflammatory process. Degenerative disc disease changes lower lumbar spine.  IMPRESSION: No acute intra abdominal or intrapelvic abnormalities. Nonobstructing bilateral renal calculi. Scattered atherosclerotic disease. Multiple ventral hernias containing fat.   Original Report Authenticated By: Lavonia Dana, M.D.    Dg Chest 2 View  04/16/2012  *RADIOLOGY  REPORT*  Clinical Data: Diffuse abdominal pain, nausea and vomiting.  CHEST - 2 VIEW  Comparison: 04/07/2012.  Findings:  Normal sized heart.  Clear lungs.  The lungs remain mildly hyperexpanded.  Diffuse osteopenia and minimal thoracic spine degenerative changes.  IMPRESSION:  1.  No acute abnormality. 2.  Stable mild changes of COPD.   Original Report Authenticated By: Claudie Revering, M.D.    Dg Chest 2 View  04/04/2012  *RADIOLOGY REPORT*  Clinical Data: Near syncope  CHEST - 2 VIEW  Comparison: 07/13/2009  Findings: Heart size and vascular pattern are normal.  Lungs are clear.  No change from prior study.  IMPRESSION: Negative chest radiographs.   Original Report Authenticated By: Skipper Cliche, M.D.    Ct Head Wo Contrast  04/04/2012  *RADIOLOGY REPORT*  Clinical Data: Near-syncope  CT HEAD WITHOUT CONTRAST  Technique:  Contiguous axial images were obtained from the base of the skull through the vertex without contrast.  Comparison: None.  Findings: No skull fracture is noted.  Paranasal sinuses and mastoid air cells are unremarkable.  No intracranial hemorrhage, mass effect or midline shift.  Mild cerebral atrophy.  Periventricular and patchy subcortical white matter decreased attenuation probable due to chronic small vessel ischemic changes.  No acute infarction.  No mass lesion is noted on this unenhanced scan.  IMPRESSION: No acute intracranial abnormality.  Mild cerebral atrophy. Periventricular and patchy subcortical white matter decreased attenuation probable due to chronic small vessel ischemic changes.   Original Report Authenticated By: Lahoma Crocker, M.D.    Dg Abd Acute W/chest  04/07/2012  *RADIOLOGY REPORT*  Clinical Data: Hernia.  Urinary tract infection  ACUTE ABDOMEN SERIES (ABDOMEN 2 VIEW & CHEST 1 VIEW)  Comparison: 04/04/2012  Findings: Normal heart size.  No pleural effusion or edema. Biapical scarring is similar to previous exam.  No superimposed airspace consolidation.  Moderate stool burden identified within the colon.  No dilated loops of small bowel or fluid levels identified.  IMPRESSION:  1.   Nonobstructive bowel gas pattern. 2.  No active cardiopulmonary abnormalities.   Original Report Authenticated By: Kerby Moors, M.D.     Microbiology: Recent Results (from the past 240 hour(s))  CLOSTRIDIUM DIFFICILE BY PCR     Status: Normal   Collection Time   04/08/12  2:52 PM      Component Value Range Status Comment   C difficile by pcr NEGATIVE  NEGATIVE Final   URINE CULTURE     Status: Normal   Collection Time   04/16/12 12:03 AM      Component Value Range Status Comment   Specimen Description URINE, RANDOM   Final    Special Requests NONE   Final    Culture  Setup Time 04/16/2012 13:12   Final    Colony Count NO GROWTH   Final    Culture NO GROWTH   Final    Report Status 04/17/2012 FINAL   Final      Labs: Basic Metabolic Panel:  Lab [id number] 04/17/12 0600 04/16/12 1615 04/15/12 2322  NA 132* 133* 129* 126*  K 4.1 4.5 5.1 5.3*  CL 100 101 98 92*  CO2 22 22 19  16*  GLUCOSE 133* 109* 122* 154*  BUN 15 19 23  26*  CREATININE 1.00 1.18* 1.30* 1.63*  CALCIUM 9.6 9.7 9.7 10.5  MG -- -- -- --  PHOS -- -- -- --   Liver Function Tests:  Lab 04/15/12 2322  AST 51*  ALT 43*  ALKPHOS  111  BILITOT 0.4  PROT 8.0  ALBUMIN 3.7    Lab 04/15/12 2322  LIPASE 120*  AMYLASE --   No results found for this basename: AMMONIA:5 in the last 168 hours CBC:  Lab 04/18/12 1129 04/17/12 0600 04/16/12 1615 04/15/12 2322  WBC 8.6 10.5 14.1* 15.2*  NEUTROABS -- -- -- 12.1*  HGB 11.5* 12.1 12.3 13.7  HCT 34.9* 36.8 37.1 42.1  MCV 84.1 83.8 83.4 83.2  PLT 189 183 195 208   Cardiac Enzymes:  Lab 04/15/12 2322  CKTOTAL --  CKMB --  CKMBINDEX --  TROPONINI <0.30   BNP: BNP (last 3 results) No results found for this basename: PROBNP:3 in the last 8760 hours CBG: No results found for this basename: GLUCAP:5 in the last 168 hours  Time coordinating discharge: 45 minutes  Signed:  Martia Dalby  [facility] 04/18/2012, 1:47 PM

## 2012-04-18 NOTE — H&P (Signed)
Physical Medicine and Rehabilitation Admission H&P  Chief Complaint   Patient presents with   .  Abdominal Pain   .  Nausea   .  Emesis   .  Weakness   .  Dizziness   :  HPI: Erin [name] is a 79 y.o. right-handed female with history of chronic back pain and hypertension. Patient with recent discharge 04/10/2012 after urinary tract infection and was discharged to home with family assistance. Patient was readmitted 04/16/2012 with nonspecific stomach discomfort as well as nausea vomiting. She had no diarrhea but did complain of constipation. Daughter had reported mild altered mentation. CT of the abdomen revealed no acute intra-abdominal or intrapelvic abnormalities. Chest x-ray was unremarkable. A followup urine culture on 04/16/2012 showed no growth. Mild hyponatremia of 126. Patient was placed on gentle IV fluids. Urine osmolality of 297. Her sodium level steadily improved 133. Issues in regards to constipation resolved with laxative assistance. Patient was placed on subcutaneous Lovenox for DVT prophylaxis.  Physical therapy evaluation completed 04/17/2011 ongoing patient noted be deconditioned with recommendations for physical medicine rehabilitation consult to consider inpatient rehabilitation services. Patient was felt to be a candidate for inpatient rehabilitation services and was admitted for comprehensive rehabilitation program  Review of Systems  Gastrointestinal: Positive for nausea, vomiting, abdominal pain and constipation.  Musculoskeletal: Positive for chronic back pain.  Neurological: Positive for weakness.  All other systems reviewed and are negative  Past Medical History   Diagnosis  Date   .  Diabetes mellitus    .  Hyperlipidemia    .  Osteoarthritis    .  Osteoporosis    .  Hypertension    .  Obesity    .  Upper GI bleed      AV malformation/when anticoag   .  Sacroiliac joint dysfunction  04/01/2012    Past Surgical History   Procedure  Date   .  Appendectomy      .  Cholecystectomy    .  Colon resection  1988     secondary to cancer   .  Polypectomy  1998     benign X 2   .  Cervicitis  1964     conization   .  Total knee arthroplasty  07/2009     right   .  Endometrial polyp  05/2000     hyperplasia-laser treatment   .  Joint replacement      Rt knee replacement    Family History   Problem  Relation  Age of Onset   .  Stroke  Father    .  Cancer  Brother       lung    Social History: reports that she quit smoking about 49 years ago. Her smoking use included Cigarettes. She has never used smokeless tobacco. She reports that she does not drink alcohol or use illicit drugs.  Allergies:  Allergies   Allergen  Reactions   .  Diclofenac Sodium  Other (See Comments)     : vomiting   .  Fluoxetine Hcl  Other (See Comments)     vomiting   .  Shellfish Allergy  Swelling    Medications Prior to Admission   Medication  Sig  Dispense  Refill   .  Ascorbic Acid (VITAMIN C) 500 MG tablet  Take 500 mg by mouth daily.     Marland Kitchen  aspirin 325 MG EC tablet  Take 325 mg by mouth daily.     Marland Kitchen  atorvastatin (LIPITOR) 10 MG tablet  Take 10 mg by mouth daily.     .  bisacodyl (LAXATIVE) 10 MG suppository  Place 10 mg rectally daily as needed. For constipation     .  calcium carbonate (OS-CAL) 600 MG TABS  Take 600 mg by mouth 2 (two) times daily with a meal.     .  cyclobenzaprine (FLEXERIL) 10 MG tablet  Take 1 tablet (10 mg total) by mouth at bedtime as needed for muscle spasms.  30 tablet  0   .  docusate sodium 100 MG CAPS  Take 100 mg by mouth 2 (two) times daily as needed for constipation.     .  ferrous sulfate 325 (65 FE) MG tablet  Take 325 mg by mouth daily with breakfast.     .  imipramine (TOFRANIL) 50 MG tablet  Take 100 mg by mouth at bedtime.     .  metoprolol (LOPRESSOR) 50 MG tablet  Take 1 tablet (50 mg total) by mouth 2 (two) times daily.  60 tablet  0   .  Multiple Vitamin (MULTIVITAMIN WITH MINERALS) TABS  Take 1 tablet by mouth daily.      .  Omega-3 Fatty Acids 300 MG CAPS  Take 1 capsule by mouth 3 (three) times daily.     Marland Kitchen  omeprazole (PRILOSEC) 20 MG capsule  Take 20 mg by mouth daily.     Vladimir Faster Glycol-Propyl Glycol (SYSTANE OP)  Place 1 drop into both eyes as needed. For dry eyes      Home:  Home Living  Lives With: Son  Available Help at Discharge: Family;Available 24 hours/day  Type of Home: Mobile home  Home Access: Ramped entrance  Home Layout: One level  Bathroom Shower/Tub: Administrator, [employer] Service: Standard  Bathroom Accessibility: Yes  How Accessible: Accessible via walker  [place]: Bedside commode/3-in-1;Walker - rolling;Walker - four wheeled;Wheelchair - Teacher, music Comments: Son is special needs, and is able to assist patient with meals and housekeeping.  Functional History:  Prior Function  Meal Prep: Moderate  Light Housekeeping: Moderate  Able to Take Stairs?: No  Driving: Yes  Vocation: Retired  Comments: Used cane for amb prior to recent admission  Functional Status:  Mobility:  Bed Mobility  Bed Mobility: Supine to Sit;Sitting - Scoot to Edge of Bed;Sit to Supine  Supine to Sit: 4: Min guard;HOB elevated;With rails  Sitting - Scoot to Edge of Bed: 4: Min guard  Sit to Supine: 4: Min assist;With rail;HOB elevated  Transfers  Transfers: Sit to Stand;Stand to Sit  Sit to Stand: 3: Mod assist;With upper extremity assist;With armrests;From bed;From chair/3-in-1  Stand to Sit: 4: Min assist;With upper extremity assist;With armrests;To chair/3-in-1  Ambulation/Gait  Ambulation/Gait Assistance: 4: Min assist  Ambulation Distance (Feet): 15 Feet (15' x 1, 6' x 1)  Assistive device: Rolling walker  Ambulation/Gait Assistance Details: Verbal cues to stand more erect and to stay closer to walker.  Gait Pattern: Step-through pattern;Decreased step length - right;Decreased step length - left;Shuffle;Trunk flexed  Gait velocity: decr   ADL:    Cognition:  Cognition  Arousal/Alertness: Awake/alert  Orientation Level: Oriented to person;Oriented to place;Oriented to time (confuse at times)  Cognition  Overall Cognitive Status: Appears within functional limits for tasks assessed/performed  Arousal/Alertness: Awake/alert  Orientation Level: Appears intact for tasks assessed  Behavior During Session: [facility] for tasks performed  Blood pressure 145/72, pulse 87, temperature 97.7 F (36.5 C), temperature  source Oral, resp. rate 18, height 5' 10.5" (1.791 m), weight 99.5 kg (219 lb 5.7 oz), SpO2 99.00%.  Physical Exam  Vitals reviewed.  Constitutional: She is oriented to person, place, and time.  HENT:  Head: Normocephalic.  Eyes:  Pupils round and reactive to light  Neck: Normal range of motion. Neck supple. No thyromegaly present.  Cardiovascular: Normal rate and regular rhythm. No murmur. Edema 1+ in each leg  Pulmonary/Chest: Effort normal and breath sounds normal. No respiratory distress.  Abdominal: Soft. Bowel sounds are normal. She exhibits no distension. There is no tenderness. There is no rebound.  Neurological: She is alert and oriented to person, place, and time. She displays normal reflexes. No cranial nerve deficit. Coordination normal.  Follow simple commands. She was able to name person place and date of birth. Strength 4/5 proximally. LES 2-3 proximally to 4/5 distally. Distracted.  Skin: Skin is warm and dry.  Psychiatric: She has a normal mood and affect. Her behavior is normal. Judgment and thought content normal  Results for orders placed during the hospital encounter of 04/15/12 (from the past 48 hour(s))   BASIC METABOLIC PANEL Status: Abnormal    Collection Time    04/16/12 4:15 PM   Component  Value  Range  Comment    Sodium  129 (*)  135 - 145 mEq/L     Potassium  5.1  3.5 - 5.1 mEq/L     Chloride  98  96 - 112 mEq/L     CO2  19  19 - 32 mEq/L     Glucose, Bld  122 (*)  70 - 99 mg/dL     BUN  23  6 - 23  mg/dL     Creatinine, Ser  1.30 (*)  0.50 - 1.10 mg/dL     Calcium  9.7  8.4 - 10.5 mg/dL     GFR calc non Af Amer  37 (*)  >90 mL/min     GFR calc Af Amer  43 (*)  >90 mL/min    CBC Status: Abnormal    Collection Time    04/16/12 4:15 PM   Component  Value  Range  Comment    WBC  14.1 (*)  4.0 - 10.5 K/uL     RBC  4.45  3.87 - 5.11 MIL/uL     Hemoglobin  12.3  12.0 - 15.0 g/dL     HCT  37.1  36.0 - 46.0 %     MCV  83.4  78.0 - 100.0 fL     MCH  27.6  26.0 - 34.0 pg     MCHC  33.2  30.0 - 36.0 g/dL     RDW  14.1  11.5 - 15.5 %     Platelets  195  150 - 400 K/uL    OSMOLALITY, URINE Status: Abnormal    Collection Time    04/16/12 5:35 PM   Component  Value  Range  Comment    Osmolality, Ur  297 (*)  390 - 1090 mOsm/kg    SODIUM, URINE, RANDOM Status: Normal    Collection Time    04/16/12 5:35 PM   Component  Value  Range  Comment    Sodium, Ur  41     BASIC METABOLIC PANEL Status: Abnormal    Collection Time    04/17/12 6:00 AM   Component  Value  Range  Comment    Sodium  133 (*)  135 - 145 mEq/L  Potassium  4.5  3.5 - 5.1 mEq/L     Chloride  101  96 - 112 mEq/L     CO2  22  19 - 32 mEq/L     Glucose, Bld  109 (*)  70 - 99 mg/dL     BUN  19  6 - 23 mg/dL     Creatinine, Ser  1.18 (*)  0.50 - 1.10 mg/dL     Calcium  9.7  8.4 - 10.5 mg/dL     GFR calc non Af Amer  42 (*)  >90 mL/min     GFR calc Af Amer  49 (*)  >90 mL/min    CBC Status: Normal    Collection Time    04/17/12 6:00 AM   Component  Value  Range  Comment    WBC  10.5  4.0 - 10.5 K/uL     RBC  4.39  3.87 - 5.11 MIL/uL     Hemoglobin  12.1  12.0 - 15.0 g/dL     HCT  36.8  36.0 - 46.0 %     MCV  83.8  78.0 - 100.0 fL     MCH  27.6  26.0 - 34.0 pg     MCHC  32.9  30.0 - 36.0 g/dL     RDW  14.2  11.5 - 15.5 %     Platelets  183  150 - 400 K/uL    TSH Status: Normal    Collection Time    04/17/12 6:00 AM   Component  Value  Range  Comment    TSH  1.603  0.350 - 4.500 uIU/mL     No results found.  Post  Admission Physician Evaluation:  1. Functional deficits secondary to deconditioning after multiple medical issues over 2 hospitalizations. 2. Patient is admitted to receive collaborative, interdisciplinary care between the physiatrist, rehab nursing staff, and therapy team. 3. Patient's level of medical complexity and substantial therapy needs in context of that medical necessity cannot be provided at a lesser intensity of care such as a SNF. 4. Patient has experienced substantial functional loss from his/her baseline which was documented above under the "Functional History" and "Functional Status" headings. Judging by the patient's diagnosis, physical exam, and functional history, the patient has potential for functional progress which will result in measurable gains while on inpatient rehab. These gains will be of substantial and practical use upon discharge in facilitating mobility and self-care at the household level. 5. Physiatrist will provide 24 hour management of medical needs as well as oversight of the therapy plan/treatment and provide guidance as appropriate regarding the interaction of the two. 6. 24 hour rehab nursing will assist with bladder management, bowel management, safety, skin/wound care, disease management, medication administration, pain management and patient education and help integrate therapy concepts, techniques,education, etc. 7. PT will assess and treat for: Lower extremity strength, range of motion, stamina, balance, functional mobility, safety, adaptive techniques and equipment. Goals are: mod I. 8. OT will assess and treat for: ADL's, functional mobility, safety, upper extremity strength, adaptive techniques and equipment. Goals are: mod I to supervision. 9. SLP will assess and treat for: n/a. Goals are: n/a. 10. Case Management and Social Worker will assess and treat for psychological issues and discharge planning. 11. Team conference will be held weekly to assess  progress toward goals and to determine barriers to discharge. 12. Patient will receive at least 3 hours of therapy per day at least 5 days per week.  13. ELOS: 7 days Prognosis: excellent Medical Problem List and Plan:  1. Deconditioning after multiple prolonged medical issues  2. DVT Prophylaxis/Anticoagulation: Subcutaneous Lovenox. Monitor platelet counts any signs of bleeding  3. Pain Management/chronic back pain. Hydrocodone as needed as well as Flexeril of which she was on prior to admission. Monitor the increased mobility. Provide K. pad for comfort as needed. Limit sedating meds as possible. 4. Neuropsych: This patient is capable of making decisions on his/her own behalf.  5. Recent urinary tract infection. Followup urinalysis study showed no growth. Check PVRs x3 to ensure adequate emptying 6. Hyponatremia. Sodium improved with latest sodium level 133. Followup chemistries on admit 7. Hypertension. Norvasc 5 mg daily, Lopressor 50 mg twice a day. Normotensive presently 8. Constipation. Resolved. Continue MiraLAX as well as Senokot tablets for now. Patient denies any nausea vomiting at this time  9. Hyperlipidemia. Lipitor  10. Chronic renal insufficiency. Baseline creatinine 1.69. Followup chemistries. Balance i's o's, follow weight.  Oval Linsey, MD 04/18/2012

## 2012-04-18 NOTE — Consult Note (Signed)
Physical Medicine and Rehabilitation Consult Reason for Consult: Deconditioning/acute renal failure Referring Physician: [facility]   HPI: Erin [name] is a 79 y.o. right-handed female with history of diabetes mellitus with peripheral neuropathy and hypertension. Patient with recent discharge 04/10/2012 after urinary tract infection and was discharged to home with family assistance. Patient was readmitted 04/16/2012 with nonspecific stomach discomfort as well as nausea vomiting. She had no diarrhea but did complain of constipation. Daughter had reported mild altered mentation. CT of the abdomen revealed no acute intra-abdominal or intrapelvic abnormalities. Chest x-ray was unremarkable. A followup urine culture on 04/16/2012 showed no growth. Mild hyponatremia of 126. Patient was placed on gentle IV fluids. Urine osmolality of 297. Her sodium level steadily improved 133. Issues in regards to constipation resolved with laxative assistance. Patient was placed on subcutaneous Lovenox for DVT prophylaxis. Physical therapy evaluation completed 04/17/2011 ongoing patient noted be deconditioned with recommendations for physical medicine rehabilitation consult to consider inpatient rehabilitation services  Review of Systems  Gastrointestinal: Positive for nausea, vomiting, abdominal pain and constipation.  Musculoskeletal: Positive for back pain.  Neurological: Positive for weakness.  All other systems reviewed and are negative.   Past Medical History  Diagnosis Date  . Diabetes mellitus   . Hyperlipidemia   . Osteoarthritis   . Osteoporosis   . Hypertension   . Obesity   . Upper GI bleed     AV malformation/when anticoag  . Sacroiliac joint dysfunction 04/01/2012   Past Surgical History  Procedure Date  . Appendectomy   . Cholecystectomy   . Colon resection 1988    secondary to cancer  . Polypectomy 1998    benign X 2  . Cervicitis 1964    conization   . Total knee arthroplasty 07/2009   right   . Endometrial polyp 05/2000    hyperplasia-laser treatment  . Joint replacement     Rt knee replacement   Family History  Problem Relation Age of Onset  . Stroke Father   . Cancer Brother     lung   Social History:  reports that she quit smoking about 49 years ago. Her smoking use included Cigarettes. She has never used smokeless tobacco. She reports that she does not drink alcohol or use illicit drugs. Allergies:  Allergies  Allergen Reactions  . Diclofenac Sodium Other (See Comments)    : vomiting  . Fluoxetine Hcl Other (See Comments)     vomiting  . Shellfish Allergy Swelling   Medications Prior to Admission  Medication Sig Dispense Refill  . Ascorbic Acid (VITAMIN C) 500 MG tablet Take 500 mg by mouth daily.        Marland Kitchen aspirin 325 MG EC tablet Take 325 mg by mouth daily.        Marland Kitchen atorvastatin (LIPITOR) 10 MG tablet Take 10 mg by mouth daily.      . bisacodyl (LAXATIVE) 10 MG suppository Place 10 mg rectally daily as needed. For constipation      . calcium carbonate (OS-CAL) 600 MG TABS Take 600 mg by mouth 2 (two) times daily with a meal.        . cyclobenzaprine (FLEXERIL) 10 MG tablet Take 1 tablet (10 mg total) by mouth at bedtime as needed for muscle spasms.  30 tablet  0  . docusate sodium 100 MG CAPS Take 100 mg by mouth 2 (two) times daily as needed for constipation.      . ferrous sulfate 325 (65 FE) MG tablet Take 325 mg  by mouth daily with breakfast.      . imipramine (TOFRANIL) 50 MG tablet Take 100 mg by mouth at bedtime.      . metoprolol (LOPRESSOR) 50 MG tablet Take 1 tablet (50 mg total) by mouth 2 (two) times daily.  60 tablet  0  . Multiple Vitamin (MULTIVITAMIN WITH MINERALS) TABS Take 1 tablet by mouth daily.      . Omega-3 Fatty Acids 300 MG CAPS Take 1 capsule by mouth 3 (three) times daily.        Marland Kitchen omeprazole (PRILOSEC) 20 MG capsule Take 20 mg by mouth daily.      Vladimir Faster Glycol-Propyl Glycol (SYSTANE OP) Place 1 drop into both eyes as  needed. For dry eyes        Home: Home Living Lives With: Son Available Help at Discharge: Family;Available 24 hours/day Type of Home: Mobile home Home Access: Ramped entrance Home Layout: One level Bathroom Shower/Tub: Chiropodist: Standard Bathroom Accessibility: Yes How Accessible: Accessible via walker [place]: Bedside commode/3-in-1;Walker - rolling;Walker - four wheeled;Wheelchair - Metallurgist Comments: Son is special needs, and is able to assist patient with meals and housekeeping.  Functional History: Prior Function Meal Prep: Moderate Light Housekeeping: Moderate Able to Take Stairs?: No Driving: Yes Vocation: Retired Comments: Used cane for amb prior to recent admission Functional Status:  Mobility: Bed Mobility Bed Mobility: Supine to Sit;Sitting - Scoot to Edge of Bed;Sit to Supine Supine to Sit: 4: Min guard;HOB elevated;With rails Sitting - Scoot to Edge of Bed: 4: Min guard Sit to Supine: 4: Min assist;With rail;HOB elevated Transfers Transfers: Sit to Stand;Stand to Sit Sit to Stand: 3: Mod assist;With upper extremity assist;With armrests;From bed;From chair/3-in-1 Stand to Sit: 4: Min assist;With upper extremity assist;With armrests;To chair/3-in-1 Ambulation/Gait Ambulation/Gait Assistance: 4: Min assist Ambulation Distance (Feet): 15 Feet (15' x 1, 6' x 1) Assistive device: Rolling walker Ambulation/Gait Assistance Details: Verbal cues to stand more erect and to stay closer to walker. Gait Pattern: Step-through pattern;Decreased step length - right;Decreased step length - left;Shuffle;Trunk flexed Gait velocity: decr    ADL:    Cognition: Cognition Arousal/Alertness: Awake/alert Orientation Level: Oriented X4 Cognition Overall Cognitive Status: Appears within functional limits for tasks assessed/performed Arousal/Alertness: Awake/alert Orientation Level: Appears intact for tasks  assessed Behavior During Session: [facility] for tasks performed  Blood pressure 147/55, pulse 84, temperature 97.8 F (36.6 C), temperature source Oral, resp. rate 21, height 5' 10.5" (1.791 m), weight 99.5 kg (219 lb 5.7 oz), SpO2 96.00%. Physical Exam  Vitals reviewed. Constitutional: She is oriented to person, place, and time.  HENT:  Head: Normocephalic.  Eyes:       Pupils round and reactive to light  Neck: Normal range of motion. Neck supple. No thyromegaly present.  Cardiovascular: Normal rate and regular rhythm.   Pulmonary/Chest: Effort normal and breath sounds normal. No respiratory distress.  Abdominal: Soft. Bowel sounds are normal. She exhibits no distension. There is no tenderness. There is no rebound.  Neurological: She is alert and oriented to person, place, and time. She displays normal reflexes. No cranial nerve deficit. Coordination normal.       Follow simple commands. She was able to name person place and date of birth. Strength 4/5 proximally. LES 2-3 proximally to 4/5 distally. Distracted.   Skin: Skin is warm and dry.  Psychiatric: She has a normal mood and affect. Her behavior is normal. Judgment and thought content normal.    Results for  orders placed during the hospital encounter of 04/15/12 (from the past 24 hour(s))  BASIC METABOLIC PANEL     Status: Abnormal   Collection Time   04/17/12  6:00 AM      Component Value Range   Sodium 133 (*) 135 - 145 mEq/L   Potassium 4.5  3.5 - 5.1 mEq/L   Chloride 101  96 - 112 mEq/L   CO2 22  19 - 32 mEq/L   Glucose, Bld 109 (*) 70 - 99 mg/dL   BUN 19  6 - 23 mg/dL   Creatinine, Ser 1.18 (*) 0.50 - 1.10 mg/dL   Calcium 9.7  8.4 - 10.5 mg/dL   GFR calc non Af Amer 42 (*) >90 mL/min   GFR calc Af Amer 49 (*) >90 mL/min  CBC     Status: Normal   Collection Time   04/17/12  6:00 AM      Component Value Range   WBC 10.5  4.0 - 10.5 K/uL   RBC 4.39  3.87 - 5.11 MIL/uL   Hemoglobin 12.1  12.0 - 15.0 g/dL   HCT 36.8  36.0 -  46.0 %   MCV 83.8  78.0 - 100.0 fL   MCH 27.6  26.0 - 34.0 pg   MCHC 32.9  30.0 - 36.0 g/dL   RDW 14.2  11.5 - 15.5 %   Platelets 183  150 - 400 K/uL  TSH     Status: Normal   Collection Time   04/17/12  6:00 AM      Component Value Range   TSH 1.603  0.350 - 4.500 uIU/mL   No results found.  Assessment/Plan: Diagnosis: deconditioned after multiple, prolonged medical issues 1. Does the need for close, 24 hr/day medical supervision in concert with the patient's rehab needs make it unreasonable for this patient to be served in a less intensive setting? Yes 2. Co-Morbidities requiring supervision/potential complications: ablan, htn, AKD, CKD 3. Due to bladder management, bowel management, safety, skin/wound care, disease management, medication administration, pain management and patient education, does the patient require 24 hr/day rehab nursing? Yes 4. Does the patient require coordinated care of a physician, rehab nurse, PT (1-2 hrs/day, 5 days/week) and OT (1-2 hrs/day, 5 days/week) to address physical and functional deficits in the context of the above medical diagnosis(es)? Yes Addressing deficits in the following areas: balance, endurance, locomotion, strength, transferring, bowel/bladder control, bathing, dressing, feeding, grooming, toileting and psychosocial support 5. Can the patient actively participate in an intensive therapy program of at least 3 hrs of therapy per day at least 5 days per week? Yes 6. The potential for patient to make measurable gains while on inpatient rehab is good 7. Anticipated functional outcomes upon discharge from inpatient rehab are mod I to supervision with PT, mod I to supervision with OT, n/a with SLP. 8. Estimated rehab length of stay to reach the above functional goals is: 7-10 days 9. Does the patient have adequate social supports to accommodate these discharge functional goals? Yes 10. Anticipated D/C setting: Home 11. Anticipated post D/C  treatments: [place] therapy 12. Overall Rehab/Functional Prognosis: excellent  RECOMMENDATIONS: This patient's condition is appropriate for continued rehabilitative care in the following setting: CIR Patient has agreed to participate in recommended program. Yes and Potentially Note that insurance prior authorization may be required for reimbursement for recommended care.  Comment:Rehab RN to follow up.   Oval Linsey, MD     04/18/2012

## 2012-04-18 NOTE — Progress Notes (Signed)
I met with patient at bedside. Patient in agreement to an admission to inpt rehab today. Dr. Zettie Pho, RN, and SW aware. I contacted pt's niece, [name], with her permission and she is also in agreement. [id number]

## 2012-04-18 NOTE — PMR Pre-admission (Signed)
PMR Admission Coordinator Pre-Admission Assessment  Patient: Erin [name] is an 79 y.o., female MRN: [id number] DOB: 1928/11/23 Height: 5' 10.5" (179.1 cm) Weight: 99.5 kg (219 lb 5.7 oz)              Insurance Information HMO:     PPO:      PCP:      IPA:      80/20: yes     OTHER:  No HMO PRIMARY: Medicare a and b      Policy#: [id number]      Subscriber: pt Benefits:  Phone #: [email or web address]     Name: 04/18/12 Eff. Date: a 06/11/95 b 10/10/00     Deduct: $1216      Out of Pocket Max: none      Life Max: none CIR: 100%      SNF: 20 full days LBD 08/13/09 [facility] after [facility] Outpatient: 80%     Co-Pay: 20% Home Health: 100%      Co-Pay: none DME: 80%     Co-Pay: 20% Providers: pt choice  SECONDARY: [employer] of [place] PPO      Policy#: [id number]      Subscriber: pt no auth needed with medicare primary  Medicaid Application Date:       Case Manager:   Emergency Contact Information Contact Information    Name Relation Home Work [place] Relative [phone]     Current Medical History  Patient Admitting Diagnosis:deconditioned after multiple, prolonged medical issues History of Present Illness: Erin [name] is a 79 y.o. right-handed female with history of diabetes mellitus with peripheral neuropathy and hypertension. Patient with recent discharge 04/10/2012 after urinary tract infection and was discharged to home with family assistance. Patient was readmitted 04/16/2012 with nonspecific stomach discomfort as well as nausea vomiting. She had no diarrhea but did complain of constipation. Niece had reported mild altered mentation. CT of the abdomen revealed no acute intra-abdominal or intrapelvic abnormalities. Chest x-ray was unremarkable. A followup urine culture on 04/16/2012 showed no growth. Mild hyponatremia of 126. Patient was placed on gentle IV fluids. Urine osmolality of 297. Her sodium level steadily improved 133. Issues in regards to constipation  resolved with laxative assistance. Patient was placed on subcutaneous Lovenox for DVT prophylaxis.   Past Medical History  Past Medical History  Diagnosis Date  . Diabetes mellitus   . Hyperlipidemia   . Osteoarthritis   . Osteoporosis   . Hypertension   . Obesity   . Upper GI bleed     AV malformation/when anticoag  . Sacroiliac joint dysfunction 04/01/2012    Family History  family history includes Cancer in her brother and Stroke in her father.  Prior Rehab/Hospitalizations: SNF 2011 after TKR/[facility]   Current Medications  Current facility-administered medications:acetaminophen (TYLENOL) suppository 650 mg, 650 mg, Rectal, Q6H PRN, Quintella Baton, MD;  acetaminophen (TYLENOL) tablet 650 mg, 650 mg, Oral, Q6H PRN, Debby Crosley, MD;  alum & mag hydroxide-simeth (MAALOX/MYLANTA) 200-200-20 MG/5ML suspension 30 mL, 30 mL, Oral, Q6H PRN, Debby Crosley, MD;  amLODipine (NORVASC) tablet 5 mg, 5 mg, Oral, Daily, Janece Canterbury, MD, 5 mg at 04/18/12 1040 aspirin EC tablet 325 mg, 325 mg, Oral, Daily, Debby Crosley, MD, 325 mg at 04/18/12 1040;  atorvastatin (LIPITOR) tablet 10 mg, 10 mg, Oral, q1800, Debby Crosley, MD, 10 mg at 04/17/12 1739;  bisacodyl (DULCOLAX) suppository 10 mg, 10 mg, Rectal, Daily PRN, Debby Crosley, MD;  cyclobenzaprine (FLEXERIL) tablet 10 mg,  10 mg, Oral, QHS PRN, Quintella Baton, MD, 10 mg at 04/17/12 0046 cyclobenzaprine (FLEXERIL) tablet 5 mg, 5 mg, Oral, BID PRN, Janece Canterbury, MD;  enoxaparin (LOVENOX) injection 40 mg, 40 mg, Subcutaneous, Q24H, Janece Canterbury, MD, 40 mg at 04/18/12 1041;  HYDROcodone-acetaminophen (NORCO/VICODIN) 5-325 MG per tablet 1 tablet, 1 tablet, Oral, Q6H PRN, Ritta Slot, NP, 1 tablet at 04/17/12 2017;  imipramine (TOFRANIL) tablet 100 mg, 100 mg, Oral, QHS, Debby Crosley, MD, 100 mg at 04/17/12 2116 magnesium hydroxide (MILK OF MAGNESIA) suspension 30 mL, 30 mL, Oral, Daily PRN, Janece Canterbury, MD;  metoprolol (LOPRESSOR) tablet  50 mg, 50 mg, Oral, BID, Debby Crosley, MD, 50 mg at 04/18/12 1041;  omega-3 acid ethyl esters (LOVAZA) capsule 1 g, 1 g, Oral, Daily, Debby Crosley, MD, 1 g at 04/18/12 1042;  ondansetron (ZOFRAN) injection 4 mg, 4 mg, Intravenous, Q6H PRN, Debby Crosley, MD ondansetron (ZOFRAN) tablet 4 mg, 4 mg, Oral, Q6H PRN, Debby Crosley, MD;  pantoprazole (PROTONIX) EC tablet 40 mg, 40 mg, Oral, Daily, Debby Crosley, MD, 40 mg at 04/18/12 1040;  polyethylene glycol (MIRALAX / GLYCOLAX) packet 17 g, 17 g, Oral, BID, Janece Canterbury, MD, 17 g at 04/18/12 1043;  senna-docusate (Senokot-S) tablet 1 tablet, 1 tablet, Oral, BID, Janece Canterbury, MD, 1 tablet at 04/18/12 1043  Patients Current Diet: Cardiac  Precautions / Restrictions Precautions Precautions: Fall Restrictions Weight Bearing Restrictions: No   Prior Activity Level Household: active and independent  Development worker, international aid / [place] Devices/Equipment: Environmental consultant (specify type);Eyeglasses;CBG Meter;Shower chair with back;Raised toilet seat with rails Home Adaptive Equipment: Bedside commode/3-in-1;Walker - rolling;Walker - four wheeled;Wheelchair - manual;Straight cane  Prior Functional Level Prior Function Level of Independence: Independent with assistive device(s);Needs assistance Needs Assistance: Meal Prep;Light Housekeeping Meal Prep: Moderate Light Housekeeping: Moderate Able to Take Stairs?: Yes Driving: Yes Vocation: Retired Comments: Lives with her 77 year old mentally handicapped son, Erin [name]. he does the cooking and helping Pt with household upkeep  Current Functional Level Cognition  Arousal/Alertness: Awake/alert Overall Cognitive Status: Appears within functional limits for tasks assessed/performed Orientation Level: Oriented to person;Oriented to place;Oriented to time (confuse at times)    Extremity Assessment (includes Sensation/Coordination)  RUE ROM/Strength/Tone: WFL for tasks assessed  RLE  ROM/Strength/Tone: Deficits RLE ROM/Strength/Tone Deficits: General weakness - strength 4-/5 RLE Sensation: WFL - Light Touch    ADLs       Mobility  Bed Mobility: Supine to Sit;Sitting - Scoot to Edge of Bed;Sit to Supine Supine to Sit: 4: Min guard;HOB elevated;With rails Sitting - Scoot to Edge of Bed: 4: Min guard Sit to Supine: 4: Min assist;With rail;HOB elevated    Transfers  Transfers: Sit to Stand;Stand to Sit Sit to Stand: 3: Mod assist;With upper extremity assist;With armrests;From bed;From chair/3-in-1 Stand to Sit: 4: Min assist;With upper extremity assist;With armrests;To chair/3-in-1    Ambulation / Gait / Stairs / Emergency planning/management officer  Ambulation/Gait Ambulation/Gait Assistance: 4: Min Wellsite geologist (Feet): 15 Feet (15' x 1, 6' x 1) Assistive device: Rolling walker Ambulation/Gait Assistance Details: Verbal cues to stand more erect and to stay closer to walker. Gait Pattern: Step-through pattern;Decreased step length - right;Decreased step length - left;Shuffle;Trunk flexed Gait velocity: decr    Posture / Balance Static Standing Balance Static Standing - Balance Support: Bilateral upper extremity supported (on walker) Static Standing - Level of Assistance: 4: Min assist    Special needs/care consideration BiPAP/CPAP no CPM no Continuous Drip IV no Dialysis no  Days no Life Vest no Oxygen no Special Bed no Trach Size no Wound Vac (area) no      Location no Skin no                              Location no Bowel mgmt: BM 1/6 but lots constipation issues Bladder mgmt: continent Diabetic mgmt yes   Previous Home Environment Living Arrangements: Children (lives with 71 yo old handicapped son) Lives With: Son Available Help at Discharge:  Erin [name] available 24/7) Type of Home: Mobile home Home Layout: One level Home Access: Ramped entrance Bathroom Shower/Tub: Chiropodist: Standard Bathroom Accessibility: Yes How  Accessible: Accessible via walker Home Care Services: No Additional Comments: Son is special needs, and is able to assist patient with meals and housekeeping.  Discharge Living Setting Plans for Discharge Living Setting: Patient's home;Mobile Home Type of Home at Discharge: Mobile home Discharge Home Layout: One level Discharge Home Access: [place] entrance Discharge Bathroom Shower/Tub: [place] unit Discharge Bathroom Toilet: Standard Discharge [place] Accessibility: Yes How Accessible: Accessible via walker Do you have any problems obtaining your medications?: No  Social/Family/Support Systems Patient Roles: Parent Contact Information: Guss Bunde, niece Anticipated Caregiver: Erin [name] and [name] Anticipated Caregiver's Contact Information: see above Ability/Limitations of Caregiver: Erin [name] physically able, mentally handicapped Caregiver Availability: 24/7 Discharge Plan Discussed with Primary Caregiver: Yes Is Caregiver In Agreement with Plan?: Yes Does Caregiver/Family have Issues with Lodging/Transportation while Pt is in Rehab?: No    Goals/Additional Needs Patient/Family Goal for Rehab: Mod I to Supervision with PT and OT Expected length of stay: ELOS 7 to 10 days Pt/Family Agrees to Admission and willing to participate: Yes Program Orientation Provided & Reviewed with Pt/Caregiver Including Roles  & Responsibilities: Yes   Decrease burden of Care through IP rehab admission: n/a   Possible need for SNF placement upon discharge: not anticipated   Patient Condition: This patient's condition remains as documented in the Consult dated 04/18/12, in which the Rehabilitation Physician determined and documented that the patient's condition is appropriate for intensive rehabilitative care in an inpatient rehabilitation facility.  Preadmission Screen Completed By:  Cleatrice Burke, 04/18/2012 11:47 AM ______________________________________________________________________    Discussed status with Dr. Naaman Plummer on 04/18/12 at  1154 and received telephone approval for admission today.  Admission Coordinator:  Cleatrice Burke, time [id number] Date 04/18/12.

## 2012-04-19 ENCOUNTER — Encounter (HOSPITAL_COMMUNITY): Payer: Medicare Other | Admitting: Occupational Therapy

## 2012-04-19 ENCOUNTER — Inpatient Hospital Stay (HOSPITAL_COMMUNITY): Payer: Medicare Other | Admitting: Occupational Therapy

## 2012-04-19 ENCOUNTER — Inpatient Hospital Stay (HOSPITAL_COMMUNITY): Payer: Medicare PPO | Admitting: Physical Therapy

## 2012-04-19 DIAGNOSIS — A4189 Other specified sepsis: Secondary | ICD-10-CM

## 2012-04-19 DIAGNOSIS — R5381 Other malaise: Secondary | ICD-10-CM

## 2012-04-19 LAB — COMPREHENSIVE METABOLIC PANEL
BUN: 13 mg/dL (ref 6–23)
CO2: 23 mEq/L (ref 19–32)
Calcium: 9.8 mg/dL (ref 8.4–10.5)
Chloride: 101 mEq/L (ref 96–112)
Creatinine, Ser: 1.04 mg/dL (ref 0.50–1.10)
GFR calc Af Amer: 57 mL/min — ABNORMAL LOW (ref >90)
GFR calc non Af Amer: 49 mL/min — ABNORMAL LOW (ref >90)
Total Bilirubin: 0.3 mg/dL (ref 0.3–1.2)

## 2012-04-19 LAB — CBC WITH DIFFERENTIAL/PLATELET
Basophils Relative: 0 % (ref 0–1)
Eosinophils Relative: 0 % (ref 0–5)
HCT: 34.8 % — ABNORMAL LOW (ref 36.0–46.0)
Hemoglobin: 11.5 g/dL — ABNORMAL LOW (ref 12.0–15.0)
Lymphocytes Relative: 33 % (ref 12–46)
MCHC: 33 g/dL (ref 30.0–36.0)
MCV: 83.5 fL (ref 78.0–100.0)
Monocytes Absolute: 0.5 10*3/uL (ref 0.1–1.0)
Monocytes Relative: 5 % (ref 3–12)
Neutro Abs: 5.2 10*3/uL (ref 1.7–7.7)
RDW: 14.4 % (ref 11.5–15.5)

## 2012-04-19 NOTE — Progress Notes (Signed)
Physical Therapy Session Note  Patient Details  Name: Erin [name] MRN: [id number] Date of Birth: 1928-11-27  Today's Date: 04/19/2012 Time: [id number] Time Calculation (min): 39 min   Skilled Therapeutic Interventions/Progress Updates:  Gait training on unit with RW x 14' and 44' with min@, pt with increased trunk  And hip flexion.  Dynamic standing with hands free to fold towels, pt could stand about 1 min before needing to sit back down due to quads fatiguing and feeling like they were cramping, performed with min@.  Transfer w/c to bed with min@, sit to supine with supervision.  Endurance is pt's limiting factor.  Therapy Documentation Precautions:  Precautions Precautions: Fall Precaution Comments: fatigue Restrictions Weight Bearing Restrictions: No Pain: Pain Assessment Pain Assessment: No/denies pain  See FIM for current functional status  Therapy/Group: Individual Therapy  Waylan Boga 04/19/2012, 3:30 PM

## 2012-04-19 NOTE — Progress Notes (Signed)
Patient information reviewed and entered into eRehab system by Regnia Mathwig, RN, CRRN, PPS Coordinator.  Information including medical coding and functional independence measure will be reviewed and updated through discharge.     Per nursing patient was given "Data Collection Information Summary for Patients in Inpatient Rehabilitation Facilities with attached "Privacy Act Statement-Health Care Records" upon admission.  

## 2012-04-19 NOTE — Progress Notes (Signed)
Social Work  Social Work Assessment and Plan  Patient Details  Name: Erin [name] MRN: [id number] Date of Birth: 01-20-1929  Today's Date: 04/19/2012  Problem List:  Patient Active Problem List  Diagnosis  . HYPERLIPIDEMIA  . OBESITY  . ANEMIA, SECONDARY TO ACUTE BLOOD LOSS  . HYPERTENSION, ESSENTIAL NOS  . PHLEBITIS, SUPERFICIAL LEG VEINS  . HEMORRHOIDS  . ABDOMINAL WALL HERNIA  . PRESSURE ULCER HEEL  . OSTEOARTHRITIS  . OSTEOPOROSIS  . SLEEP DISORDER  . Other screening mammogram  . DM2 (diabetes mellitus, type 2)  . Post-menopausal  . Frequent UTI  . Low back pain  . Renal insufficiency  . Sciatica  . Sacroiliac joint dysfunction  . UTI (lower urinary tract infection)  . Dehydration  . Constipation  . Colon cancer H/O  . Acute renal failure  . Hyponatremia  . Generalized weakness  . Acute-on-chronic kidney injury  . Physical deconditioning   Past Medical History:  Past Medical History  Diagnosis Date  . Diabetes mellitus   . Hyperlipidemia   . Osteoarthritis   . Osteoporosis   . Hypertension   . Obesity   . Upper GI bleed     AV malformation/when anticoag  . Sacroiliac joint dysfunction 04/01/2012   Past Surgical History:  Past Surgical History  Procedure Date  . Appendectomy   . Cholecystectomy   . Colon resection 1988    secondary to cancer  . Polypectomy 1998    benign X 2  . Cervicitis 1964    conization   . Total knee arthroplasty 07/2009    right   . Endometrial polyp 05/2000    hyperplasia-laser treatment  . Joint replacement     Rt knee replacement   Social History:  reports that she quit smoking about 49 years ago. Her smoking use included Cigarettes. She has never used smokeless tobacco. She reports that she does not drink alcohol or use illicit drugs.  Family / Support Systems Marital Status: Widow/Widower How Long?: 1998 Patient Roles: Parent Children: son, Jeneen Rinks, lives at home with patient - ("mentally challenged" per pt,  however, able to be left alone at home and manages "just fine") Other Supports: Bufford Spikes @ (H) [phone] or [phone] Anticipated Caregiver: Jeneen Rinks and [name] Ability/Limitations of Caregiver: Jeneen Rinks physically able, mentally handicapped Caregiver Availability: 24/7 Family Dynamics: pt notes good support from son and other local family  Social History Preferred language: English Religion: Methodist Cultural Background: NA Education: HS Read: Yes Write: Yes Employment Status: Retired Freight forwarder Issues: none Guardian/Conservator: neice has POA for pt's son   Abuse/Neglect Physical Abuse: Denies Verbal Abuse: Denies Sexual Abuse: Denies Exploitation of patient/patient's resources: Denies Self-Neglect: Denies  Emotional Status Pt's affect, behavior adn adjustment status: Very pleasant, oriented woman here following multiple medical issues and for deconditining.  Denies any significant emotional distress.  No concerns about son at home or her d/c plans. Recent Psychosocial Issues: None Pyschiatric History: None Substance Abuse History: None  Patient / Family Perceptions, Expectations & Goals Pt/Family understanding of illness & functional limitations: Pt able to provide good report of medical issues she has had over the past month.  Good understanidng of her functional limitations and need for CIR Premorbid pt/family roles/activities: Pt and son managing well at home and sharing in household responsibilities. Anticipated changes in roles/activities/participation: Little change anticipated as pt's goals have been set for modified independent Pt/family expectations/goals: Pt anticipates being able to return home with son with little change to  their routine.  "Just need to get my strength up"  [employer]: None Premorbid Home Care/DME Agencies: None Transportation available at discharge: yes  Discharge Planning Living  Arrangements: Children (lives with 92 yo old handicapped son) [place]: Children;Other relatives Type of Residence: Private residence Insurance Resources: Education officer, museum (specify) Nurse, mental health) Financial Resources: Radio broadcast assistant Screen Referred: No Living Expenses: Own Money Management: Patient Do you have any problems obtaining your medications?: No Home Management: pt and son share  Patient/Family Preliminary Plans: Pt plans to return home with her son as her primary support and additional help from neices as needed. Social Work Anticipated Follow Up Needs: HH/OP Expected length of stay: ELOS 7 to 10 days  Clinical Impression Very pleasant, oriented woman here for stengthening and endurance - Good support at home and pt denies any concerns about managing at d/c. Denies any significant emotional distress, however, will monitor.  Laurence Crofford 04/19/2012, 3:46 PM

## 2012-04-19 NOTE — Plan of Care (Signed)
Overall Plan of Care [facility]) Patient Details Name: Erin [name] MRN: [id number] DOB: Sep 08, 1928  Diagnosis:  Deconditioning after urosepsis and multiple medical  Primary Diagnosis:    Physical deconditioning Co-morbidities: hyponatremia, htn, constipation, renal insufficiency  Functional Problem List  Patient demonstrates impairments in the following areas: Balance, Endurance and Motor  Basic ADL's: grooming, bathing, dressing and toileting Advanced ADL's: simple meal preparation  Transfers:  bed mobility, bed to chair, toilet, tub/shower, car and furniture Locomotion:  ambulation and stairs  Additional Impairments:  Leisure Awareness and Discharge Disposition  Anticipated Outcomes Item Anticipated Outcome  Eating/Swallowing    Basic self-care  Mod I   Tolieting  Mod I   Bowel/Bladder   Regular bowel and bladder pattern  Transfers  Mod I  Locomotion  Mod I  Communication    Cognition    Pain  Pain level less than or equal to 3  Safety/Judgment    Other     Therapy Plan:   OT Intensity: Minimum of 1-2 x/day, 45 to 90 minutes OT Frequency: 5 out of 7 days OT Duration/Estimated Length of Stay: 7 -10 days      Team Interventions: Item RN PT OT SLP SW TR Other  Self Care/Advanced ADL Retraining   x      Neuromuscular Re-Education   x      Therapeutic Activities  x x      UE/LE Strength Training/ROM  x x      UE/LE Coordination Activities  x x      Visual/Perceptual Remediation/Compensation         DME/Adaptive Equipment Instruction  x x      Therapeutic Exercise  x x      Balance/Vestibular Training  x x      Patient/Family Education x x x      Cognitive Remediation/Compensation         Functional Mobility Training  x x      Ambulation/Gait Training  x x      Stair Training  x       Wheelchair Propulsion/Positioning  x       Functional IT sales professional Reintegration  x x      Dysphagia/Aspiration Journalist, newspaper         Bladder Management x        Bowel Management x        Disease Management/Prevention x        Pain Management x x x      Medication Management x        Skin Care/Wound Management x        Splinting/Orthotics  x       Discharge Planning x x x      Psychosocial Support x  x                             Team Discharge Planning: Destination: PT-  ,OT- Home , SLP-  Projected Follow-up: PT- , OT-  Home health OT, SLP-  Projected Equipment Needs: PT- , OT-  , SLP-  Patient/family involved in discharge planning: PT-  ,  OT-Patient, SLP-   MD ELOS: 7-10 days Medical Rehab Prognosis:  Excellent Assessment: The patient has been admitted for CIR therapies. The team will be addressing, functional mobility, strength, stamina, balance, safety, adaptive techniques/equipment, self-care,  bowel and bladder mgt, patient and caregiver education. Goals have been set at mod I.      See Team Conference Notes for weekly updates to the plan of care

## 2012-04-19 NOTE — Progress Notes (Signed)
Occupational Therapy Session Note  Patient Details  Name: Erin [name] MRN: [id number] Date of Birth: 28-Feb-1929  Today's Date: 04/19/2012 Time: 1400-1430 Time Calculation (min): 30 min  Short Term Goals: Week 1:  OT Short Term Goal 1 (Week 1): STG=LTG overall mod I  Skilled Therapeutic Interventions/Progress Updates:    1:1 focus on functional ambulation with upright posture with RW from recliner to toilet in BR, toileting sit to stand, activity tolerance, bed mobility in ADL apartment getting in and out of bed on left side, tub bench transfer into tub as an option for bathing. Pt required rest breaks due to fatigue - pt unable to remain standing after walking out of bathroom to wash hands at sink.   Therapy Documentation Precautions:  Precautions Precautions: Fall Precaution Comments: fatigue Restrictions Weight Bearing Restrictions: No Pain: Pain Assessment Pain Assessment: No/denies pain  See FIM for current functional status  Therapy/Group: Individual Therapy  [name] [facility] 04/19/2012, 3:56 PM

## 2012-04-19 NOTE — Progress Notes (Signed)
[place] Individual Statement of Services  Patient Name:  Erin [name]  Date:  04/19/2012  Welcome to the [facility].  Our goal is to provide you with an individualized program based on your diagnosis and situation, designed to meet your specific needs.  With this comprehensive rehabilitation program, you will be expected to participate in at least 3 hours of rehabilitation therapies Monday-Friday, with modified therapy programming on the weekends.  Your rehabilitation program will include the following services:  Physical Therapy (PT), Occupational Therapy (OT), 24 hour per day rehabilitation nursing, Therapeutic Recreaction (TR), Case Management (Social Worker), Rehabilitation Medicine, Nutrition Services and Pharmacy Services  Weekly team conferences will be held on Tuesdays to discuss your progress.  Your  Social Worker will talk with you frequently to get your input and to update you on team discussions.  Team conferences with you and your family in attendance may also be held.  Expected length of stay: 7-10 days  Overall anticipated outcome: modified independent  Depending on your progress and recovery, your program may change.  Your  Social Worker will coordinate services and will keep you informed of any changes.  Your  Social Worker's name and contact numbers are listed  below.  The following services may also be recommended but are not provided by the [facility] will be made to provide these services after discharge if needed.  Arrangements include referral to agencies that provide these services.  Your insurance has been verified to be:  Medicare and [place] Your primary doctor is:  Dr. [name]  Pertinent information will be shared with your doctor and your insurance  company.   Social Worker:  [place], [place] or ([phone]  Information discussed with and copy given to patient by: Lennart Pall, 04/19/2012, 2:53 PM

## 2012-04-19 NOTE — Progress Notes (Signed)
Subjective/Complaints: Feeling pretty well. Was able to sleep. Good appetite A 12 point review of systems has been performed and if not noted above is otherwise negative.  Objective: Vital Signs: Blood pressure 141/76, pulse 84, temperature 97.9 F (36.6 C), temperature source Oral, resp. rate 18, height 5\' 10"  (1.778 m), weight 103.7 kg (228 lb 9.9 oz), SpO2 98.00%. No results found.  Basename 04/19/12 0640 04/18/12 1948  WBC 8.5 8.8  HGB 11.5* 11.9*  HCT 34.8* 36.5  PLT 183 176    Basename 04/19/12 0640 04/18/12 1948 04/18/12 1129  NA 135 -- 132*  K 4.0 -- 4.1  CL 101 -- 100  GLUCOSE 127* -- 133*  BUN 13 -- 15  CREATININE 1.04 1.02 --  CALCIUM 9.8 -- 9.6   CBG (last 3)  No results found for this basename: GLUCAP:3 in the last 72 hours  Wt Readings from Last 3 Encounters:  04/19/12 103.7 kg (228 lb 9.9 oz)  04/17/12 99.5 kg (219 lb 5.7 oz)  04/10/12 107.7 kg (237 lb 7 oz)    Physical Exam:  Cognition  Arousal/Alertness: Awake/alert  Orientation Level: Oriented to person;Oriented to place;Oriented to time (confuse at times)  Cognition  Overall Cognitive Status: Appears within functional limits for tasks assessed/performed  Arousal/Alertness: Awake/alert  Orientation Level: Appears intact for tasks assessed  Behavior During Session: [facility] for tasks performed  Blood pressure 145/72, pulse 87, temperature 97.7 F (36.5 C), temperature source Oral, resp. rate 18, height 5' 10.5" (1.791 m), weight 99.5 kg (219 lb 5.7 oz), SpO2 99.00%.  Physical Exam  Vitals reviewed.  Constitutional: She is oriented to person, place, and time.  HENT:  Head: Normocephalic.  Eyes:  Pupils round and reactive to light  Neck: Normal range of motion. Neck supple. No thyromegaly present.  Cardiovascular: Normal rate and regular rhythm. No murmur. Edema 1+ in each leg  Pulmonary/Chest: Effort normal and breath sounds normal. No respiratory distress.  Abdominal: Soft. Bowel sounds are normal.  She exhibits no distension. There is no tenderness. There is no rebound.  Neurological: She is alert and oriented to person, place, and time. She displays normal reflexes. No cranial nerve deficit. Coordination normal.  Follow simple commands. She was able to name person place and date of birth. Strength 4/5 proximally. LES 2 + to 3 proximally to 4/5 distally. Distracted.  Skin: Skin is warm and dry.  Psychiatric: She has a normal mood and affect. Her behavior is normal. Judgment and thought content normal    Assessment/Plan: 1. Functional deficits secondary to deconditioning after multiple medical issues over 2 hospitalizations which require 3+ hours per day of interdisciplinary therapy in a comprehensive inpatient rehab setting. Physiatrist is providing close team supervision and 24 hour management of active medical problems listed below. Physiatrist and rehab team continue to assess barriers to discharge/monitor patient progress toward functional and medical goals. FIM:             FIM - Bed/Chair Transfer Bed/Chair Transfer: 4: Chair or W/C > Bed: Min A (steadying Pt. > 75%);4: Bed > Chair or W/C: Min A (steadying Pt. > 75%)  FIM - Locomotion: Wheelchair Locomotion: Wheelchair: 2: Travels 50 - 149 ft with supervision, cueing or coaxing FIM - Locomotion: Ambulation Locomotion: Ambulation: 1: Travels less than 50 ft with moderate assistance (Pt: 50 - 74%)  Comprehension Comprehension Mode: Auditory Comprehension: 6-Follows complex conversation/direction: With extra time/assistive device  Expression Expression Mode: Verbal Expression: 6-Expresses complex ideas: With extra time/assistive device  Social Interaction  Social Interaction: 6-Interacts appropriately with others with medication or extra time (anti-anxiety, antidepressant).  Problem Solving Problem Solving: 6-Solves complex problems: With extra time  Memory Memory: 6-More than reasonable amt of time  Medical  Problem List and Plan:  1. Deconditioning after multiple prolonged medical issues  2. DVT Prophylaxis/Anticoagulation: Subcutaneous Lovenox. Monitor platelet counts any signs of bleeding  3. Pain Management/chronic back pain. Hydrocodone as needed as well as Flexeril of which she was on prior to admission. Monitor the increased mobility. Provide K. pad for comfort as needed. Limit sedating meds as possible.  4. Neuropsych: This patient is capable of making decisions on his/her own behalf.  5. Recent urinary tract infection. Checking pvr's 6. Hyponatremia. Sodium improved to 135 7. Hypertension. Norvasc 5 mg daily, Lopressor 50 mg twice a day. Normotensive presently  8. Constipation. Resolved. Continue MiraLAX as well as Senokot tablets for now. Patient denies any nausea vomiting at this time  9. Hyperlipidemia. Lipitor  10. Chronic renal insufficiency. CR now 1.04 today.  LOS (Days) 1 A FACE TO FACE EVALUATION WAS PERFORMED  Lebaron Bautch T 04/19/2012 10:38 AM

## 2012-04-19 NOTE — Evaluation (Signed)
Physical Therapy Assessment and Plan  Patient Details  Name: Erin [name] MRN: [id number] Date of Birth: 1929/02/25  PT Diagnosis: Difficulty walking and Muscle weakness Rehab Potential: Good ELOS: 10 days   Today's Date: 04/19/2012 Time: 0800-0900 Time Calculation (min): 60 min  Problem List:  Patient Active Problem List  Diagnosis  . HYPERLIPIDEMIA  . OBESITY  . ANEMIA, SECONDARY TO ACUTE BLOOD LOSS  . HYPERTENSION, ESSENTIAL NOS  . PHLEBITIS, SUPERFICIAL LEG VEINS  . HEMORRHOIDS  . ABDOMINAL WALL HERNIA  . PRESSURE ULCER HEEL  . OSTEOARTHRITIS  . OSTEOPOROSIS  . SLEEP DISORDER  . Other screening mammogram  . DM2 (diabetes mellitus, type 2)  . Post-menopausal  . Frequent UTI  . Low back pain  . Renal insufficiency  . Sciatica  . Sacroiliac joint dysfunction  . UTI (lower urinary tract infection)  . Dehydration  . Constipation  . Colon cancer H/O  . Acute renal failure  . Hyponatremia  . Generalized weakness  . Acute-on-chronic kidney injury  . Physical deconditioning    Past Medical History:  Past Medical History  Diagnosis Date  . Diabetes mellitus   . Hyperlipidemia   . Osteoarthritis   . Osteoporosis   . Hypertension   . Obesity   . Upper GI bleed     AV malformation/when anticoag  . Sacroiliac joint dysfunction 04/01/2012   Past Surgical History:  Past Surgical History  Procedure Date  . Appendectomy   . Cholecystectomy   . Colon resection 1988    secondary to cancer  . Polypectomy 1998    benign X 2  . Cervicitis 1964    conization   . Total knee arthroplasty 07/2009    right   . Endometrial polyp 05/2000    hyperplasia-laser treatment  . Joint replacement     Rt knee replacement    Assessment & Plan Clinical Impression: Patient is a 79 y.o. year old female with recent discharge 04/10/2012 after urinary tract infection and was discharged to home with family assistance. Patient was readmitted 04/16/2012 with nonspecific stomach  discomfort as well as nausea vomiting. She had no diarrhea but did complain of constipation. Daughter had reported mild altered mentation. CT of the abdomen revealed no acute intra-abdominal or intrapelvic abnormalities. Chest x-ray was unremarkable. A followup urine culture on 04/16/2012 showed no growth. Mild hyponatremia of 126. Patient was placed on gentle IV fluids. Urine osmolality of 297. Her sodium level steadily improved 133. Issues in regards to constipation resolved with laxative assistance. Patient transferred to CIR on 04/18/2012 .   Patient currently requires mod with mobility secondary to muscle weakness, decreased cardiorespiratoy endurance and decreased standing balance.  Prior to hospitalization, patient was modified independent  with mobility and lived with Son in a Mobile home home.  Home access is  Ramped entrance.  Patient will benefit from skilled PT intervention to maximize safe functional mobility, minimize fall risk and decrease caregiver burden for planned discharge home with intermittent assist.  Anticipate patient will benefit from follow up [facility] at discharge.  PT - End of Session Activity Tolerance: Tolerates 30+ min activity with multiple rests Endurance Deficit: Yes Endurance Deficit Description: very fatigued with minimal activity; perform functional ambulation with forward flexed posture- challenging to come fully erect PT Assessment Rehab Potential: Good PT Plan PT Intensity: Minimum of 1-2 x/day ,45 to 90 minutes PT Frequency: 5 out of 7 days PT Duration Estimated Length of Stay: 10 days PT Treatment/Interventions: Ambulation/gait training;Community reintegration;DME/adaptive equipment instruction;Neuromuscular re-education;Therapeutic  Exercise;UE/LE Strength taining/ROM;Splinting/orthotics;Pain management;Discharge planning;Balance/vestibular training;Patient/family education;Stair training;Wheelchair propulsion/positioning;UE/LE Coordination activities;Therapeutic  Activities;Functional mobility training PT Recommendation Follow Up Recommendations: Home health PT Patient destination: Home  PT Evaluation Precautions/Restrictions Precautions Precautions: Fall Precaution Comments: fatigue Restrictions Weight Bearing Restrictions: No Pain Pain Assessment Pain Assessment: No/denies pain Home Living/Prior Functioning Home Living Lives With: Son Available Help at Discharge: Family Type of Home: Mobile home Home Access: Ramped entrance Home Layout: One level Bathroom Shower/Tub: Chiropodist: Standard Home Adaptive Equipment: Environmental consultant - rolling;Straight cane;Walker - four wheeled Additional Comments: Son is special needs, and is able to assist patient with meals and housekeeping Prior Function Level of Independence: Independent with transfers;Independent with homemaking with ambulation;Independent with gait Able to Take Stairs?: Yes Driving: Yes Cognition Overall Cognitive Status: Appears within functional limits for tasks assessed Arousal/Alertness: Awake/alert Orientation Level: Oriented X4 Safety/Judgment: Appears intact Sensation Sensation Light Touch: Appears Intact Proprioception: Appears Intact Coordination Gross Motor Movements are Fluid and Coordinated: Yes Fine Motor Movements are Fluid and Coordinated: Yes Motor  Motor Motor: Within Functional Limits Motor - Skilled Clinical Observations: generalized weakness  Mobility Bed Mobility Supine to Sit: 4: Min assist Sit to Supine: 4: Min guard Transfers Sit to Stand: 4: Min assist Stand to Sit: 4: Min assist Stand Pivot Transfers: 3: Mod assist Stand Pivot Transfer Details (indicate cue type and reason): lifting assist, cues for UE/LE placement, steadying assist with turning Locomotion  Ambulation Ambulation: Yes Ambulation/Gait Assistance: 3: Mod assist Ambulation Distance (Feet): 7 Feet Assistive device: None Ambulation/Gait Assistance Details: unable  to fully extend B knees, forward flexed trunk, decreased cadence Stairs / Additional Locomotion Stairs: Yes Stairs Assistance: 4: Min assist Stair Management Technique: Two rails Number of Stairs: 3  Wheelchair Mobility Wheelchair Mobility: Yes Wheelchair Assistance: 5: Careers information officer: Both upper extremities Wheelchair Parts Management: Needs assistance Distance: 75  Trunk/Postural Assessment  Cervical Assessment Cervical Assessment:  (forward head) Thoracic Assessment Thoracic Assessment:  (mild kyphosis) Lumbar Assessment Lumbar Assessment:  (forward flexed trunk in standing) Postural Control Postural Control: Within Functional Limits  Balance Static Standing Balance Static Standing - Balance Support: During functional activity Static Standing - Level of Assistance: 4: Min assist Dynamic Standing Balance Dynamic Standing - Level of Assistance: 3: Mod assist Extremity Assessment  RUE Assessment RUE Assessment: Within Functional Limits LUE Assessment LUE Assessment: Within Functional Limits RLE Assessment RLE Assessment:  (grossly 3/5 except unable to achieve full knee extension) LLE Assessment LLE Assessment:  (grossly 3/5, unable to achieve full knee extension)  FIM:  FIM - Bed/Chair Transfer Bed/Chair Transfer: 4: Supine > Sit: Min A (steadying Pt. > 75%/lift 1 leg) FIM - Locomotion: Wheelchair Distance: 75 Locomotion: Wheelchair: 2: Travels 50 - 149 ft with supervision, cueing or coaxing FIM - Locomotion: Ambulation Ambulation/Gait Assistance: 3: Mod assist Locomotion: Ambulation: 1: Travels less than 50 ft with moderate assistance (Pt: 50 - 74%) FIM - Locomotion: Stairs Locomotion: Stairs: 1: Up and Down < 4 stairs with minimal assistance (Pt.>75%)   Refer to Care Plan for Long Term Goals  Recommendations for other services: None  Discharge Criteria: Patient will be discharged from PT if patient refuses treatment 3 consecutive times without  medical reason, if treatment goals not met, if there is a change in medical status, if patient makes no progress towards goals or if patient is discharged from hospital.  The above assessment, treatment plan, treatment alternatives and goals were discussed and mutually agreed upon: by patient  Treatment initiated during session: Short distance gait  training with RW 3 x 15-20' with min A.  Pt with very forward flexed posture, unable to fully extend B knees, fatigues easily.  Pt HR 80-90 bpm with activity.  Transfer training sit to stand from various surfaces for increasing functional activity tolerance.  Pt requires min-mod A from low surfaces, supervision from elevated surfaces.  Ramp training with RW to simulate home entry, pt able to ascend ramp with min A with RW.  Peter Keyworth 04/19/2012, 12:26 PM

## 2012-04-19 NOTE — Evaluation (Signed)
Occupational Therapy Assessment and Plan  Patient Details  Name: Erin [name] MRN: [id number] Date of Birth: [birth year]  OT Diagnosis: muscle weakness (generalized) Rehab Potential: Rehab Potential: Good ELOS: 7 -10 days   Today's Date: 04/19/2012 Time: [id number] Time Calculation (min): 60 min  Problem List:  Patient Active Problem List  Diagnosis  . HYPERLIPIDEMIA  . OBESITY  . ANEMIA, SECONDARY TO ACUTE BLOOD LOSS  . HYPERTENSION, ESSENTIAL NOS  . PHLEBITIS, SUPERFICIAL LEG VEINS  . HEMORRHOIDS  . ABDOMINAL WALL HERNIA  . PRESSURE ULCER HEEL  . OSTEOARTHRITIS  . OSTEOPOROSIS  . SLEEP DISORDER  . Other screening mammogram  . DM2 (diabetes mellitus, type 2)  . Post-menopausal  . Frequent UTI  . Low back pain  . Renal insufficiency  . Sciatica  . Sacroiliac joint dysfunction  . UTI (lower urinary tract infection)  . Dehydration  . Constipation  . Colon cancer H/O  . Acute renal failure  . Hyponatremia  . Generalized weakness  . Acute-on-chronic kidney injury  . Physical deconditioning    Past Medical History:  Past Medical History  Diagnosis Date  . Diabetes mellitus   . Hyperlipidemia   . Osteoarthritis   . Osteoporosis   . Hypertension   . Obesity   . Upper GI bleed     AV malformation/when anticoag  . Sacroiliac joint dysfunction 04/01/2012   Past Surgical History:  Past Surgical History  Procedure Date  . Appendectomy   . Cholecystectomy   . Colon resection 1988    secondary to cancer  . Polypectomy 1998    benign X 2  . Cervicitis 1964    conization   . Total knee arthroplasty 07/2009    right   . Endometrial polyp 05/2000    hyperplasia-laser treatment  . Joint replacement     Rt knee replacement    Assessment & Plan Clinical Impression: Patient is a 79 y.o. year old female right-handed female with history of chronic back pain and hypertension. Patient with recent discharge 04/10/2012 after urinary tract infection and was discharged to  home with family assistance. Patient was readmitted 04/16/2012 with nonspecific stomach discomfort as well as nausea vomiting. She had no diarrhea but did complain of constipation. Daughter had reported mild altered mentation. CT of the abdomen revealed no acute intra-abdominal or intrapelvic abnormalities. Chest x-ray was unremarkable. A followup urine culture on 04/16/2012 showed no growth. Mild hyponatremia of 126. Patient was placed on gentle IV fluids. Urine osmolality of 297. Her sodium level steadily improved 133. Issues in regards to constipation resolved with laxative assistance. Patient was placed on subcutaneous Lovenox for DVT prophylaxis.  Physical therapy evaluation completed 04/17/2011 ongoing patient noted be deconditioned   Patient transferred to CIR on 04/18/2012 .    Patient currently requires min to mod A  with basic self-care skills and basic mobility secondary to muscle weakness, decreased cardiorespiratoy endurance and decreased standing balance, decreased balance strategies and poor activity tolerance/ edurance requiring rest breaks.  Prior to hospitalization, patient could complete ADL with independent .  Patient will benefit from skilled intervention to decrease level of assist with basic self-care skills and increase independence with basic self-care skills prior to discharge home independently.  Anticipate patient will require intermittent supervision and for Iadls and follow up home health.  OT - End of Session Activity Tolerance: Tolerates 10 - 20 min activity with multiple rests Endurance Deficit: Yes Endurance Deficit Description: very fatigued with minimal activity; perform functional ambulation with forward flexed  posture- challenging to come fully erect OT Assessment Rehab Potential: Good OT Plan OT Intensity: Minimum of 1-2 x/day, 45 to 90 minutes OT Frequency: 5 out of 7 days OT Duration/Estimated Length of Stay: 7 -10 days OT Treatment/Interventions:  Balance/vestibular training;Functional mobility training;DME/adaptive equipment instruction;Pain management;Discharge planning;Patient/family education;Self Care/advanced ADL retraining;Therapeutic Activities;UE/LE Coordination activities;UE/LE Strength taining/ROM;Therapeutic Exercise OT Recommendation Patient destination: Home Follow Up Recommendations: Home health OT  OT Evaluation Precautions/Restrictions  Precautions Precautions: Fall Precaution Comments: fatigue General Chart Reviewed: Yes Family/Caregiver Present: No Vital Signs   Pain Pain Assessment Pain Assessment: No/denies pain Home Living/Prior Functioning Home Living Lives With: Son Available Help at Discharge: Family Type of Home: Mobile home Home Access: Ramped entrance Home Layout: One level Bathroom Shower/Tub: Chiropodist: Standard Home Adaptive Equipment: Bedside commode/3-in-1;Walker - rolling;Walker - four wheeled;Straight cane Additional Comments: Son is special needs, and is able to assist patient with meals and housekeeping ADL   Vision/Perception  Vision - History Baseline Vision: Wears glasses all the time Patient Visual Report: No change from baseline Vision - Assessment Eye Alignment: Within Functional Limits Perception Perception: Within Functional Limits Praxis Praxis: Intact  Cognition Overall Cognitive Status: Appears within functional limits for tasks assessed Arousal/Alertness: Awake/alert Orientation Level: Oriented X4 Safety/Judgment: Appears intact Sensation Sensation Light Touch: Appears Intact Proprioception: Appears Intact Coordination Gross Motor Movements are Fluid and Coordinated: Yes Fine Motor Movements are Fluid and Coordinated: Yes Motor  Motor Motor - Skilled Clinical Observations: generalized weakness Mobility  Bed Mobility Sit to Supine: 4: Min guard Transfers Sit to Stand: 4: Min assist Stand to Sit: 4: Min assist  Trunk/Postural  Assessment  Cervical Assessment Cervical Assessment:  (forward flexed) Thoracic Assessment Thoracic Assessment:  (forward flexed) Postural Control Postural Control: Within Functional Limits  Balance Static Standing Balance Static Standing - Balance Support: During functional activity Static Standing - Level of Assistance: 4: Min assist Extremity/Trunk Assessment RUE Assessment RUE Assessment: Within Functional Limits LUE Assessment LUE Assessment: Within Functional Limits  See FIM for current functional status Refer to Care Plan for Long Term Goals  Recommendations for other services: None  Discharge Criteria: Patient will be discharged from OT if patient refuses treatment 3 consecutive times without medical reason, if treatment goals not met, if there is a change in medical status, if patient makes no progress towards goals or if patient is discharged from hospital.  The above assessment, treatment plan, treatment alternatives and goals were discussed and mutually agreed upon: by patient  OT eval initiated: with OT role, purpose and goals discussed with pt. Self care retraining at shower level with focus on short distance functional ambulation with mod A without AD, shower stall transfers, sit to stand, standing tolerance with upright posture, activity tolerance and endurance. Pt with increased fatigue requiring rest breaks. Pt with difficulty coming into fully upright posture in standing  [name] [facility] 04/19/2012, 11:09 AM

## 2012-04-20 ENCOUNTER — Encounter (HOSPITAL_COMMUNITY): Payer: Medicare Other | Admitting: Occupational Therapy

## 2012-04-20 ENCOUNTER — Inpatient Hospital Stay (HOSPITAL_COMMUNITY): Payer: Medicare PPO | Admitting: Physical Therapy

## 2012-04-20 ENCOUNTER — Inpatient Hospital Stay (HOSPITAL_COMMUNITY): Payer: Medicare Other | Admitting: Occupational Therapy

## 2012-04-20 NOTE — Progress Notes (Signed)
Occupational Therapy Session Note  Patient Details  Name: Erin [name] MRN: [id number] Date of Birth: November 21, 1928  Today's Date: 04/20/2012 Time: 1300-1330 Time Calculation (min): 30 min  Short Term Goals: Week 1:  OT Short Term Goal 1 (Week 1): STG=LTG overall mod I  Skilled Therapeutic Interventions/Progress Updates:    1:1 therapeutic activity of navigating through obstacle course with RW (side stepping through cones, stepping up and down on a step, crossing over thresholds, going up and down a ramp (simulating one outside of her house). Pt required 3 rest breaks in session due to fatigue. Min tactile cues for upright posture.   Therapy Documentation Precautions:  Precautions Precautions: Fall Precaution Comments: fatigue Restrictions Weight Bearing Restrictions: No Pain:  no c/o pain just fatigue  See FIM for current functional status  Therapy/Group: Individual Therapy  [name] [facility] 04/20/2012, 2:38 PM

## 2012-04-20 NOTE — Progress Notes (Signed)
Physical Therapy Note  Patient Details  Name: Erin [name] MRN: [id number] Date of Birth: Jul 08, 1928 Today's Date: 04/20/2012  Time: 1400-1430 30 minutes  No c/o pain.  Treatment focused on increasing strength and activity tolerance.  Nu step x 10 minutes level 4 at 30-40 steps/minute.  Gait training 150' with supervision!  Improved activity tolerance.  Individual therapy   [name] 04/20/2012, 3:06 PM

## 2012-04-20 NOTE — Progress Notes (Signed)
Subjective/Complaints:  slept well. Had a great day with therapy A 12 point review of systems has been performed and if not noted above is otherwise negative.  Objective: Vital Signs: Blood pressure 157/64, pulse 76, temperature 98.1 F (36.7 C), temperature source Oral, resp. rate 18, height 5\' 10"  (1.778 m), weight 103.7 kg (228 lb 9.9 oz), SpO2 97.00%. No results found.  Basename 04/19/12 0640 04/18/12 1948  WBC 8.5 8.8  HGB 11.5* 11.9*  HCT 34.8* 36.5  PLT 183 176    Basename 04/19/12 0640 04/18/12 1948 04/18/12 1129  NA 135 -- 132*  K 4.0 -- 4.1  CL 101 -- 100  GLUCOSE 127* -- 133*  BUN 13 -- 15  CREATININE 1.04 1.02 --  CALCIUM 9.8 -- 9.6   CBG (last 3)  No results found for this basename: GLUCAP:3 in the last 72 hours  Wt Readings from Last 3 Encounters:  04/19/12 103.7 kg (228 lb 9.9 oz)  04/17/12 99.5 kg (219 lb 5.7 oz)  04/10/12 107.7 kg (237 lb 7 oz)    Physical Exam:     Blood pressure 145/72, pulse 87, temperature 97.7 F (36.5 C), temperature source Oral, resp. rate 18, height 5' 10.5" (1.791 m), weight 99.5 kg (219 lb 5.7 oz), SpO2 99.00%.  Physical Exam  Vitals reviewed.  Constitutional: She is oriented to person, place, and time.  HENT:  Head: Normocephalic.  Eyes:  Pupils round and reactive to light  Neck: Normal range of motion. Neck supple. No thyromegaly present.  Cardiovascular: Normal rate and regular rhythm. No murmur. Edema 1+ in each leg  Pulmonary/Chest: Effort normal and breath sounds normal. No respiratory distress.  Abdominal: Soft. Bowel sounds are normal. She exhibits no distension. There is no tenderness. There is no rebound.  Neurological: She is alert and oriented to person, place, and time. She displays normal reflexes. No cranial nerve deficit. Coordination normal.  Follow simple commands. She was able to name person place and date of birth. Strength 4/5 proximally. LES  3 proximally to 4/5 distally. Distracted.  Skin: Skin is  warm and dry.  Psychiatric: She has a normal mood and affect. Her behavior is normal. Judgment and thought content normal    Assessment/Plan: 1. Functional deficits secondary to deconditioning after multiple medical issues over 2 hospitalizations which require 3+ hours per day of interdisciplinary therapy in a comprehensive inpatient rehab setting. Physiatrist is providing close team supervision and 24 hour management of active medical problems listed below. Physiatrist and rehab team continue to assess barriers to discharge/monitor patient progress toward functional and medical goals. FIM: FIM - Bathing Bathing Steps Patient Completed: Chest;Right Arm;Left Arm;Abdomen;Front perineal area;Right upper leg;Left upper leg;Left lower leg (including foot);Right lower leg (including foot) Bathing: 4: Min-Patient completes 8-9 79f 10 parts or 75+ percent  FIM - Upper Body Dressing/Undressing Upper body dressing/undressing steps patient completed: Thread/unthread right sleeve of front closure shirt/dress;Thread/unthread left sleeve of front closure shirt/dress;Button/unbutton shirt;Pull shirt around back of front closure shirt/dress Upper body dressing/undressing: 5: Set-up assist to: Obtain clothing/put away FIM - Lower Body Dressing/Undressing Lower body dressing/undressing steps patient completed: Thread/unthread right underwear leg;Thread/unthread left underwear leg;Thread/unthread right pants leg;Thread/unthread left pants leg;Pull pants up/down;Don/Doff left sock;Don/Doff right sock Lower body dressing/undressing: 4: Min-Patient completed 75 plus % of tasks  FIM - Toileting Toileting steps completed by patient: Performs perineal hygiene;Adjust clothing prior to toileting;Adjust clothing after toileting Toileting Assistive Devices: Grab bar or rail for support Toileting: 4: Steadying assist     FIM -  Bed/Chair Transfer Bed/Chair Transfer: 4: Supine > Sit: Min A (steadying Pt. > 75%/lift 1  leg)  FIM - Locomotion: Wheelchair Distance: 75 Locomotion: Wheelchair: 2: Travels 50 - 149 ft with supervision, cueing or coaxing FIM - Locomotion: Ambulation Ambulation/Gait Assistance: 3: Mod assist Locomotion: Ambulation: 1: Travels less than 50 ft with moderate assistance (Pt: 50 - 74%)  Comprehension Comprehension Mode: Auditory Comprehension: 5-Understands complex 90% of the time/Cues < 10% of the time  Expression Expression Mode: Verbal Expression: 5-Expresses complex 90% of the time/cues < 10% of the time  Social Interaction Social Interaction: 6-Interacts appropriately with others with medication or extra time (anti-anxiety, antidepressant).  Problem Solving Problem Solving: 5-Solves complex 90% of the time/cues < 10% of the time  Memory Memory: 6-More than reasonable amt of time  Medical Problem List and Plan:  1. Deconditioning after multiple prolonged medical issues  2. DVT Prophylaxis/Anticoagulation: Subcutaneous Lovenox. Monitor platelet counts any signs of bleeding  3. Pain Management/chronic back pain. Hydrocodone as needed as well as Flexeril of which she was on prior to admission. Monitor the increased mobility. Provide K. pad for comfort as needed. Limit sedating meds as possible.  4. Neuropsych: This patient is capable of making decisions on his/her own behalf.  5. Recent urinary tract infection/urosepsis. Checking pvr's. Wbc's decreasing 6. Hyponatremia. Sodium improved to 135 7. Hypertension. Norvasc 5 mg daily, Lopressor 50 mg twice a day. Normotensive presently  8. Constipation. Resolved. Continue MiraLAX as well as Senokot tablets for now. Patient denies any nausea vomiting at this time  9. Hyperlipidemia. Lipitor  10. Chronic renal insufficiency. CR now 1.04 today.  LOS (Days) 2 A FACE TO FACE EVALUATION WAS PERFORMED  SWARTZ,ZACHARY T 04/20/2012 8:23 AM

## 2012-04-20 NOTE — Progress Notes (Signed)
Physical Therapy Note  Patient Details  Name: KENJA POLKINGHORNE MRN: [id number] Date of Birth: 15-Feb-1929 Today's Date: 04/20/2012  Time: 1100-1145 45 minutes  No c/o pain.  Short distance gait training focus on upright posture with supervision, verbal cuing.  Step ups and tap ups for LE strength and endurance, pt able to tolerate 7-10 step ups with R LE, 3-5 with L LE before fatigue.  Standing tolerance/balance activity with reaching, placing objects with pt able to stand 45 seconds at a time before fatigue.  Bed mobility training with supervision, increased time due to fatigue.  Individual therapy   Ahana Najera 04/20/2012, 12:20 PM

## 2012-04-20 NOTE — Progress Notes (Signed)
Physical Therapy Note  Patient Details  Name: Erin [name] MRN: [id number] Date of Birth: 07-22-1928 Today's Date: 04/20/2012  Time: 930-957 27 minutes  No c/o pain, pt c/o fatigue from previous OT session, rests as needed.  Short distance gait with RW with focus on improving activity tolerance and posture.  Pt requires cuing for trunk and hip extension and is able to extend trunk for brief periods with gait but fatigues quickly.  Multiple walks of 30-40' with supervision.  Simulated mini van transfer with supervision.  Pt with good technique and safety awareness.  Individual therapy   Cena Bruhn 04/20/2012, 9:55 AM

## 2012-04-20 NOTE — Progress Notes (Signed)
Occupational Therapy Session Note  Patient Details  Name: Erin [name] MRN: [id number] Date of Birth: 07/15/1928  Today's Date: 04/20/2012 Time: 0830-0930 Time Calculation (min): 60 min  Short Term Goals: Week 1:  OT Short Term Goal 1 (Week 1): STG=LTG overall mod I  Skilled Therapeutic Interventions/Progress Updates:    1:1 self care retraining at shower level down in tub room with focus on basic transfers with and without RW, tub transfer using tub bench, activity tolerance, sit to stands, standing balance with clothing management. Pt with increased fatigue today requiring extra rest breaks after bath and required assistance with socks today due to fatigue. Pt with c/o dizziness with bending over HR 103 and O2 95%. Resolved quickly.  Therapy Documentation Precautions:  Precautions Precautions: Fall Precaution Comments: fatigue Restrictions Weight Bearing Restrictions: No    Vital Signs: Therapy Vitals Temp: 98.1 F (36.7 C) Temp src: Oral Pulse Rate: 76  Resp: 18  BP: 157/64 mmHg Oxygen Therapy SpO2: 97 % O2 Device: None (Room air) Pain:  no c/o pain  See FIM for current functional status  Therapy/Group: Individual Therapy  [name] [facility] 04/20/2012, 9:28 AM

## 2012-04-21 ENCOUNTER — Inpatient Hospital Stay (HOSPITAL_COMMUNITY): Payer: Medicare PPO | Admitting: Physical Therapy

## 2012-04-21 ENCOUNTER — Inpatient Hospital Stay (HOSPITAL_COMMUNITY): Payer: Medicare PPO

## 2012-04-21 ENCOUNTER — Encounter (HOSPITAL_COMMUNITY): Payer: Medicare Other | Admitting: Occupational Therapy

## 2012-04-21 NOTE — Progress Notes (Signed)
Physical Therapy Session Note  Patient Details  Name: Erin [name] MRN: [id number] Date of Birth: 05/18/1928  Today's Date: 04/21/2012 Time: 1300-1400 Time Calculation (min): 60 min  Skilled Therapeutic Interventions/Progress Updates:   Treatment focused on activities for overall endurance, balance, and strengthening. Dynamic balance activity in standing to kick ball to another patient and progressed to baseball to bat and pitch the ball without UE support. Multiple seated rest breaks needed due to pain and fatigue. Gait training with RW through obstacle course to walk on compliant surface, step over threshold and weave in and out of cones; 1 seated rest break needed. While resting pt completed therex for functional strengthening including ankle pumps, seated marches, and LAQs. Returned to bed end of session with overall S.  Therapy Documentation Precautions:  Precautions Precautions: Fall Precaution Comments: fatigue Restrictions Weight Bearing Restrictions: No  Pain: C/o pain in hip/buttocks - RN notified for pain medication.   Locomotion : Ambulation Ambulation/Gait Assistance: 4: Min assist    See FIM for current functional status  Therapy/Group: Individual Therapy  Canary Brim [facility] 04/21/2012, 4:28 PM

## 2012-04-21 NOTE — Progress Notes (Signed)
Occupational Therapy Session Note  Patient Details  Name: [name] MRN: [id number] Date of Birth: 13-Jul-1928  Today's Date: 04/21/2012 Time: 0800-0900 Time Calculation (min): 60 min  Short Term Goals: Week 1:  OT Short Term Goal 1 (Week 1): STG=LTG overall mod I  Skilled Therapeutic Interventions/Progress Updates:    Pt with c/o dizziness once got to BR but then resolved (reported this is new since being here) and pt is very thirsty in all of her session but reported not putting out a lot. Made MD aware. Self care retraining at shower level in her room with focus on activity tolerance, shower transfer, sit to stands from different surfaces, endurance with frequent rest break s. Pt progressing towards LTG, with continued focus on activity tolerance.  Therapy Documentation Precautions:  Precautions Precautions: Fall Precaution Comments: fatigue Restrictions Weight Bearing Restrictions: No General:   Vital Signs: Therapy Vitals Temp: 98.6 F (37 C) Temp src: Oral Pulse Rate: 68  Resp: 20  BP: 120/58 mmHg Oxygen Therapy SpO2: 99 % O2 Device: None (Room air) Pain: Pain Assessment Pain Assessment: No/denies pain  See FIM for current functional status  Therapy/Group: Individual Therapy  [name] [facility] 04/21/2012, 8:40 AM

## 2012-04-21 NOTE — Progress Notes (Signed)
Subjective/Complaints:  doing well. Continues to excel in therapy. A 12 point review of systems has been performed and if not noted above is otherwise negative.  Objective: Vital Signs: Blood pressure 120/58, pulse 68, temperature 98.6 F (37 C), temperature source Oral, resp. rate 20, height 5\' 10"  (1.778 m), weight 103.7 kg (228 lb 9.9 oz), SpO2 99.00%. No results found.  Basename 04/19/12 0640 04/18/12 1948  WBC 8.5 8.8  HGB 11.5* 11.9*  HCT 34.8* 36.5  PLT 183 176    Basename 04/19/12 0640 04/18/12 1948 04/18/12 1129  NA 135 -- 132*  K 4.0 -- 4.1  CL 101 -- 100  GLUCOSE 127* -- 133*  BUN 13 -- 15  CREATININE 1.04 1.02 --  CALCIUM 9.8 -- 9.6   CBG (last 3)  No results found for this basename: GLUCAP:3 in the last 72 hours  Wt Readings from Last 3 Encounters:  04/19/12 103.7 kg (228 lb 9.9 oz)  04/17/12 99.5 kg (219 lb 5.7 oz)  04/10/12 107.7 kg (237 lb 7 oz)    Physical Exam:     Blood pressure 145/72, pulse 87, temperature 97.7 F (36.5 C), temperature source Oral, resp. rate 18, height 5' 10.5" (1.791 m), weight 99.5 kg (219 lb 5.7 oz), SpO2 99.00%.  Physical Exam  Vitals reviewed.  Constitutional: She is oriented to person, place, and time.  HENT:  Head: Normocephalic.  Eyes:  Pupils round and reactive to light  Neck: Normal range of motion. Neck supple. No thyromegaly present.  Cardiovascular: Normal rate and regular rhythm. No murmur. Edema trace to 1+ in each leg  Pulmonary/Chest: Effort normal and breath sounds normal. No respiratory distress.  Abdominal: Soft. Bowel sounds are normal. She exhibits no distension. There is no tenderness. There is no rebound.  Neurological: She is alert and oriented to person, place, and time. She displays normal reflexes. No cranial nerve deficit. Coordination normal.  Follow simple commands. She was able to name person place and date of birth. Strength 4/5 proximally. LES  3+ to 4 proximally to 4+/5 distally. Distracted.   Skin: Skin is warm and dry.  Psychiatric: She has a normal mood and affect. Her behavior is normal. Judgment and thought content normal    Assessment/Plan: 1. Functional deficits secondary to deconditioning after multiple medical issues over 2 hospitalizations which require 3+ hours per day of interdisciplinary therapy in a comprehensive inpatient rehab setting. Physiatrist is providing close team supervision and 24 hour management of active medical problems listed below. Physiatrist and rehab team continue to assess barriers to discharge/monitor patient progress toward functional and medical goals. FIM: FIM - Bathing Bathing Steps Patient Completed: Chest;Right Arm;Right lower leg (including foot);Left Arm;Abdomen;Left lower leg (including foot);Front perineal area;Buttocks;Right upper leg;Left upper leg Bathing: 5: Set-up assist to: Adjust water temp  FIM - Upper Body Dressing/Undressing Upper body dressing/undressing steps patient completed: Thread/unthread right bra strap;Hook/unhook bra;Thread/unthread left bra strap;Thread/unthread left sleeve of pullover shirt/dress;Put head through opening of pull over shirt/dress;Pull shirt over trunk;Thread/unthread right sleeve of pullover shirt/dresss Upper body dressing/undressing: 5: Set-up assist to: Obtain clothing/put away FIM - Lower Body Dressing/Undressing Lower body dressing/undressing steps patient completed: Thread/unthread right underwear leg;Thread/unthread left underwear leg;Pull underwear up/down;Thread/unthread right pants leg;Thread/unthread left pants leg;Pull pants up/down Lower body dressing/undressing: 4: Min-Patient completed 75 plus % of tasks  FIM - Toileting Toileting steps completed by patient: Adjust clothing prior to toileting;Performs perineal hygiene;Adjust clothing after toileting Toileting Assistive Devices: Grab bar or rail for support Toileting: 4: Steadying assist  FIM - Engineer, structural Devices: Mining engineer Transfers: 5-To toilet/BSC: Supervision (verbal cues/safety issues)  FIM - IT sales professional Transfer: 4: Chair or W/C > Bed: Min A (steadying Pt. > 75%);4: Bed > Chair or W/C: Min A (steadying Pt. > 75%)  FIM - Locomotion: Wheelchair Distance: 75 Locomotion: Wheelchair: 2: Travels 50 - 149 ft with supervision, cueing or coaxing FIM - Locomotion: Ambulation Ambulation/Gait Assistance: 3: Mod assist Locomotion: Ambulation: 1: Travels less than 50 ft with supervision/safety issues  Comprehension Comprehension Mode: Auditory Comprehension: 6-Follows complex conversation/direction: With extra time/assistive device  Expression Expression Mode: Verbal Expression: 6-Expresses complex ideas: With extra time/assistive device  Social Interaction Social Interaction: 6-Interacts appropriately with others with medication or extra time (anti-anxiety, antidepressant).  Problem Solving Problem Solving: 6-Solves complex problems: With extra time  Memory Memory: 6-More than reasonable amt of time  Medical Problem List and Plan:  1. Deconditioning after multiple prolonged medical issues  2. DVT Prophylaxis/Anticoagulation: Subcutaneous Lovenox. Monitor platelet counts any signs of bleeding  3. Pain Management/chronic back pain. Hydrocodone as needed as well as Flexeril of which she was on prior to admission.   Provided K. pad for comfort as needed. Limit sedating meds as possible.  4. Neuropsych: This patient is capable of making decisions on his/her own behalf.  5. Recent urinary tract infection/urosepsis. Wbc's decreasing. Urinary habits normalizing. 6. Hyponatremia. Sodium improved to 135 7. Hypertension. Norvasc 5 mg daily, Lopressor 50 mg twice a day. Normotensive presently  8. Constipation. Resolved. Continue MiraLAX as well as Senokot tablets for now. Patient denies any nausea vomiting at this time  9. Hyperlipidemia. Lipitor  10.  Chronic renal insufficiency. CR now 1.04 today.  LOS (Days) 3 A FACE TO FACE EVALUATION WAS PERFORMED  Tatsuo Musial T 04/21/2012 8:18 AM

## 2012-04-21 NOTE — Progress Notes (Signed)
Physical Therapy Session Note  Patient Details  Name: Erin [name] MRN: [id number] Date of Birth: 1928-06-21  Today's Date: 04/21/2012 Time: [id number] Time Calculation (min): 45 min  Skilled Therapeutic Interventions/Progress Updates:  Patient ambulated for improved LE strengthening and endurance with RW and supervision with slow cadence and cuing for improved proximity to walker 140' x2.  NuStep at level 4 for 10 minutes with moderate exertion reported.  Step-ups onto 6" step for LE strengthening 5x each leg with seated rest between right and left.  Upon return to room supervision for transfer from RW to commode, nursing advised patient in bathroom.    Therapy Documentation Pain: Pain Assessment Pain Assessment: No/denies pain  See FIM for current functional status  Therapy/Group: Individual Therapy  Forde Dandy 04/21/2012, 12:28 PM

## 2012-04-21 NOTE — Progress Notes (Signed)
Physical Therapy Note  Patient Details  Name: Erin [name] MRN: [id number] Date of Birth: 08-04-1928 Today's Date: 04/21/2012  Time: 1100-1130 30 minutes  Pt c/o "nerve pain" R buttock to calf, eased with rest.  Treatment focused on kitchen mobility for home management tasks and activity tolerance.  Pt stood with supervision to unload dishwasher and put dishes/silverware away.  Pt required frequent rest breaks due to fatigue from previous sessions.  Pt safe with mobility including side and backward stepping in kitchen environment.  Individual therapy   Esteban Kobashigawa 04/21/2012, 11:31 AM

## 2012-04-22 ENCOUNTER — Inpatient Hospital Stay (HOSPITAL_COMMUNITY): Payer: Medicare PPO

## 2012-04-22 DIAGNOSIS — A4189 Other specified sepsis: Secondary | ICD-10-CM

## 2012-04-22 DIAGNOSIS — R5381 Other malaise: Secondary | ICD-10-CM

## 2012-04-22 NOTE — Progress Notes (Signed)
Occupational Therapy Session Note  Patient Details  Name: Erin [name] MRN: [id number] Date of Birth: 07-15-1928  Today's Date: 04/22/2012 Time: [id number] Time Calculation (min): 38 min  Short Term Goals: Week 1:  OT Short Term Goal 1 (Week 1): STG=LTG overall mod I  Skilled Therapeutic Interventions: Therapeutic activities with emphasis on functional mobility using RW and endurance training using NuStep.   Patient educated on methods to build endurance to include charting progress on her ability to tolerate increased load on NuStep, or increased rate (rpm), or increased total time relative to perceived exertion as per BORG scale.   Patient performed NuStep X 12:15 min, level 2, chair position 14, at rate of 25-29 watts with resultant exertion after activity reported as being only 11 (light) this date.   Patient required cues to remember to use arms to rise from seated and to lower herself back into chairs as well as cues to lift her head up before ambulating.   Participation was good throughout activity with good attention and social interaction.    Therapy Documentation Precautions:  Precautions Precautions: Fall Precaution Comments: fatigue Restrictions Weight Bearing Restrictions: No  Pain: Pain Assessment Pain Assessment: No/denies pain  See FIM for current functional status  Therapy/Group: Individual Therapy  Maryan Puls 04/22/2012, 3:44 PM

## 2012-04-22 NOTE — Progress Notes (Signed)
Patient ID: Erin [name], female   DOB: [birth year], 79 y.o.   MRN: [id number] Subjective/Complaints:  doing well. Continues to excel in therapy. A 12 point review of systems has been performed and if not noted above is otherwise negative.  Objective: Vital Signs: Blood pressure 121/75, pulse 77, temperature 98.4 F (36.9 C), temperature source Oral, resp. rate 17, height 5\' 10"  (1.778 m), weight 103.7 kg (228 lb 9.9 oz), SpO2 98.00%. No results found. No results found for this basename: WBC:2,HGB:2,HCT:2,PLT:2 in the last 72 hours No results found for this basename: NA:2,K:2,CL:2,CO:2,GLUCOSE:2,BUN:2,CREATININE:2,CALCIUM:2 in the last 72 hours CBG (last 3)  No results found for this basename: GLUCAP:3 in the last 72 hours  Wt Readings from Last 3 Encounters:  04/19/12 103.7 kg (228 lb 9.9 oz)  04/17/12 99.5 kg (219 lb 5.7 oz)  04/10/12 107.7 kg (237 lb 7 oz)    Physical Exam:     Blood pressure 145/72, pulse 87, temperature 97.7 F (36.5 C), temperature source Oral, resp. rate 18, height 5' 10.5" (1.791 m), weight 99.5 kg (219 lb 5.7 oz), SpO2 99.00%.  Physical Exam  Vitals reviewed.  Constitutional: She is oriented to person, place, and time.  HENT:  Head: Normocephalic.  Eyes:  Pupils round and reactive to light  Neck: Normal range of motion. Neck supple. No thyromegaly present.  Cardiovascular: Normal rate and regular rhythm. No murmur. Edema trace to 1+ in each leg  Pulmonary/Chest: Effort normal and breath sounds normal. No respiratory distress.  Abdominal: Soft. Bowel sounds are normal. She exhibits no distension. There is no tenderness. There is no rebound.  Neurological: She is alert and oriented to person, place, and time. She displays normal reflexes. No cranial nerve deficit. Coordination normal. +SLR on R side Follow simple commands. She was able to name person place and date of birth. Strength 4/5 proximally. LES  3+ to 4 proximally to 4+/5 distally. Distracted.    Skin: Skin is warm and dry.  Psychiatric: She has a normal mood and affect. Her behavior is normal. Judgment and thought content normal    Assessment/Plan: 1. Functional deficits secondary to deconditioning after multiple medical issues over 2 hospitalizations which require 3+ hours per day of interdisciplinary therapy in a comprehensive inpatient rehab setting. Physiatrist is providing close team supervision and 24 hour management of active medical problems listed below. Physiatrist and rehab team continue to assess barriers to discharge/monitor patient progress toward functional and medical goals. FIM: FIM - Bathing Bathing Steps Patient Completed: Chest;Right Arm;Right lower leg (including foot);Left Arm;Abdomen;Left lower leg (including foot);Front perineal area;Buttocks;Right upper leg;Left upper leg Bathing: 5: Set-up assist to: Adjust water temp  FIM - Upper Body Dressing/Undressing Upper body dressing/undressing steps patient completed: Thread/unthread right bra strap;Hook/unhook bra;Thread/unthread left bra strap;Thread/unthread left sleeve of pullover shirt/dress;Put head through opening of pull over shirt/dress;Pull shirt over trunk;Thread/unthread right sleeve of pullover shirt/dresss Upper body dressing/undressing: 5: Set-up assist to: Obtain clothing/put away FIM - Lower Body Dressing/Undressing Lower body dressing/undressing steps patient completed: Thread/unthread right underwear leg;Thread/unthread left underwear leg;Pull underwear up/down;Thread/unthread right pants leg;Thread/unthread left pants leg;Pull pants up/down;Don/Doff right sock;Don/Doff left sock;Don/Doff right shoe;Don/Doff left shoe Lower body dressing/undressing: 5: Supervision: Safety issues/verbal cues  FIM - Toileting Toileting steps completed by patient: Adjust clothing prior to toileting;Performs perineal hygiene;Adjust clothing after toileting Toileting Assistive Devices: Grab bar or rail for  support Toileting: 4: Steadying assist  FIM - Radio producer Devices: Grab bars;Walker Toilet Transfers: 5-To toilet/BSC: Supervision (verbal cues/safety issues)  FIM - Bed/Chair Transfer Bed/Chair Transfer: 5: Chair or W/C > Bed: Supervision (verbal cues/safety issues);5: Bed > Chair or W/C: Supervision (verbal cues/safety issues)  FIM - Locomotion: Wheelchair Distance: 75 Locomotion: Wheelchair: 2: Travels 50 - 149 ft with supervision, cueing or coaxing FIM - Locomotion: Ambulation Locomotion: Ambulation Assistive Devices: Administrator Ambulation/Gait Assistance: 4: Min assist Locomotion: Ambulation: 2: Travels 50 - 149 ft with supervision/safety issues  Comprehension Comprehension Mode: Auditory Comprehension: 6-Follows complex conversation/direction: With extra time/assistive device  Expression Expression Mode: Verbal Expression: 6-Expresses complex ideas: With extra time/assistive device  Social Interaction Social Interaction: 6-Interacts appropriately with others with medication or extra time (anti-anxiety, antidepressant).  Problem Solving Problem Solving: 6-Solves complex problems: With extra time  Memory Memory: 6-More than reasonable amt of time  Medical Problem List and Plan:  1. Deconditioning after multiple prolonged medical issues  2. DVT Prophylaxis/Anticoagulation: Subcutaneous Lovenox. Monitor platelet counts any signs of bleeding  3. Pain Management/chronic back pain.Also with sciatica trial gabapentin, may benefit from [facility] but would need MRI first   Hydrocodone as needed as well as Flexeril of which she was on prior to admission.   Provided K. pad for comfort as needed. Limit sedating meds as possible.  4. Neuropsych: This patient is capable of making decisions on his/her own behalf.  5. Recent urinary tract infection/urosepsis. Wbc's decreasing. Urinary habits normalizing. 6. Hyponatremia. Sodium improved to 135 7.  Hypertension. Norvasc 5 mg daily, Lopressor 50 mg twice a day. Normotensive presently  8. Constipation. Resolved. Continue MiraLAX as well as Senokot tablets for now. Patient denies any nausea vomiting at this time  9. Hyperlipidemia. Lipitor  10. Chronic renal insufficiency. CR now 1.04 today.  LOS (Days) 4 A FACE TO FACE EVALUATION WAS PERFORMED  Charlett Blake 04/22/2012 10:43 AM

## 2012-04-23 ENCOUNTER — Inpatient Hospital Stay (HOSPITAL_COMMUNITY): Payer: Medicare PPO

## 2012-04-23 ENCOUNTER — Inpatient Hospital Stay (HOSPITAL_COMMUNITY): Payer: Medicare PPO | Admitting: *Deleted

## 2012-04-23 ENCOUNTER — Inpatient Hospital Stay (HOSPITAL_COMMUNITY): Payer: Medicare PPO | Admitting: Physical Therapy

## 2012-04-23 MED ORDER — IMIPRAMINE HCL 50 MG PO TABS
50.0000 mg | ORAL_TABLET | Freq: Every day | ORAL | Status: DC
  Administered 2012-04-23 – 2012-04-24 (×2): 50 mg via ORAL
  Filled 2012-04-23 (×4): qty 1

## 2012-04-23 MED ORDER — GABAPENTIN 100 MG PO CAPS
100.0000 mg | ORAL_CAPSULE | Freq: Every day | ORAL | Status: DC
  Administered 2012-04-23 – 2012-04-24 (×2): 100 mg via ORAL
  Filled 2012-04-23 (×5): qty 1

## 2012-04-23 NOTE — Progress Notes (Signed)
Physical Therapy Note  Patient Details  Name: Erin [name] MRN: [id number] Date of Birth: 11-07-1928 Today's Date: 04/23/2012  14:30-15:30 (60 min) Individual session Pain reported:6/10 in R Hip, pain medicine has been administered by RN during session Gait training: Patient ambulated with FWW from her room to the gym and back with 2 sitting rest breaks in between.During ambulation verbal cues provided for posture and step length. Gait training provided also w/o AD on a distance of 30 feet with HHA, in preparation to d/c home, where patient walks holding to furniture. Therapeutic Activities: supine to sit, sit to stand transfers with stand by assist. Reciprocal step ups 3x 10 steps. Dynamic standing balance training including bouncing the ball, and side steps. Turning R and L w/o AD.   [name] Spanish 04/23/2012, 4:52 PM

## 2012-04-23 NOTE — Progress Notes (Addendum)
Patient ID: Erin [name], female   DOB: 02-Nov-1928, 79 y.o.   MRN: [id number] Subjective/Complaints: Still has some R sciatic pain.  C/O drowsiness during the day A 12 point review of systems has been performed and if not noted above is otherwise negative.  Objective: Vital Signs: Blood pressure 134/76, pulse 65, temperature 98.1 F (36.7 C), temperature source Oral, resp. rate 17, height 5\' 10"  (1.778 m), weight 103.7 kg (228 lb 9.9 oz), SpO2 95.00%. No results found. No results found for this basename: WBC:2,HGB:2,HCT:2,PLT:2 in the last 72 hours No results found for this basename: NA:2,K:2,CL:2,CO:2,GLUCOSE:2,BUN:2,CREATININE:2,CALCIUM:2 in the last 72 hours CBG (last 3)  No results found for this basename: GLUCAP:3 in the last 72 hours  Wt Readings from Last 3 Encounters:  04/19/12 103.7 kg (228 lb 9.9 oz)  04/17/12 99.5 kg (219 lb 5.7 oz)  04/10/12 107.7 kg (237 lb 7 oz)    Physical Exam:   Blood pressure 145/72, pulse 87, temperature 97.7 F (36.5 C), temperature source Oral, resp. rate 18, height 5' 10.5" (1.791 m), weight 99.5 kg (219 lb 5.7 oz), SpO2 99.00%.  Physical Exam  Vitals reviewed.  Constitutional: She is oriented to person, place, and time.  HENT:  Head: Normocephalic.  Eyes:  Pupils round and reactive to light  Neck: Normal range of motion. Neck supple. No thyromegaly present.  Cardiovascular: Normal rate and regular rhythm. No murmur. Edema trace to 1+ in each leg  Pulmonary/Chest: Effort normal and breath sounds normal. No respiratory distress.  Abdominal: Soft. Bowel sounds are normal. She exhibits no distension. There is no tenderness. There is no rebound.  Neurological: She is alert and oriented to person, place, and time. She displays normal reflexes. No cranial nerve deficit. Coordination normal. +SLR on R side Follow simple commands. She was able to name person place and date of birth. Strength 4/5 proximally. LES  3+ to 4 proximally to 4+/5 distally.  Distracted.  Skin: Skin is warm and dry.  Psychiatric: She has a normal mood and affect. Her behavior is normal. Judgment and thought content normal    Assessment/Plan: 1. Functional deficits secondary to deconditioning after multiple medical issues over 2 hospitalizations which require 3+ hours per day of interdisciplinary therapy in a comprehensive inpatient rehab setting. Physiatrist is providing close team supervision and 24 hour management of active medical problems listed below. Physiatrist and rehab team continue to assess barriers to discharge/monitor patient progress toward functional and medical goals. FIM: FIM - Bathing Bathing Steps Patient Completed: Chest;Right Arm;Left Arm;Abdomen;Front perineal area;Buttocks;Right upper leg;Left upper leg;Right lower leg (including foot);Left lower leg (including foot) Bathing: 5: Set-up assist to: Obtain items  FIM - Upper Body Dressing/Undressing Upper body dressing/undressing steps patient completed: Thread/unthread right sleeve of pullover shirt/dresss;Thread/unthread left sleeve of pullover shirt/dress;Put head through opening of pull over shirt/dress;Pull shirt over trunk Upper body dressing/undressing: 5: Set-up assist to: Obtain clothing/put away FIM - Lower Body Dressing/Undressing Lower body dressing/undressing steps patient completed: Thread/unthread right underwear leg;Thread/unthread left underwear leg;Pull underwear up/down;Thread/unthread right pants leg;Thread/unthread left pants leg;Pull pants up/down;Don/Doff right sock;Don/Doff left sock;Don/Doff right shoe;Don/Doff left shoe Lower body dressing/undressing: 5: Set-up assist to: Obtain clothing  FIM - Toileting Toileting steps completed by patient: Adjust clothing prior to toileting;Performs perineal hygiene;Adjust clothing after toileting Toileting Assistive Devices: Grab bar or rail for support Toileting: 6: More than reasonable amount of time  FIM - Glass blower/designer Devices: Walker;Grab bars Toilet Transfers: 5-To toilet/BSC: Supervision (verbal cues/safety issues);5-From toilet/BSC: Supervision (  verbal cues/safety issues)  FIM - Engineer, site Assistive Devices: Copy: 5: Supine > Sit: Supervision (verbal cues/safety issues);5: Sit > Supine: Supervision (verbal cues/safety issues)  FIM - Locomotion: Wheelchair Distance: 75 Locomotion: Wheelchair: 2: Travels 50 - 149 ft with supervision, cueing or coaxing FIM - Locomotion: Ambulation Locomotion: Ambulation Assistive Devices: Administrator Ambulation/Gait Assistance: 4: Min assist Locomotion: Ambulation: 2: Travels 50 - 149 ft with supervision/safety issues  Comprehension Comprehension Mode: Auditory Comprehension: 6-Follows complex conversation/direction: With extra time/assistive device  Expression Expression Mode: Verbal Expression: 6-Expresses complex ideas: With extra time/assistive device  Social Interaction Social Interaction: 6-Interacts appropriately with others with medication or extra time (anti-anxiety, antidepressant).  Problem Solving Problem Solving: 6-Solves complex problems: With extra time  Memory Memory: 7-Complete Independence: No helper  Medical Problem List and Plan:  1. Deconditioning after multiple prolonged medical issues  2. DVT Prophylaxis/Anticoagulation: Subcutaneous Lovenox. Monitor platelet counts any signs of bleeding  3. Pain Management/chronic back pain.Also with sciatica trial gabapentin  qhs , may benefit from [facility] but would need MRI first   Hydrocodone as needed as well as Flexeril of which she was on prior to admission.   Provided K. pad for comfort as needed. Limit sedating meds as possible reduce tofranil.  4. Neuropsych: This patient is capable of making decisions on his/her own behalf.  5. Recent urinary tract infection/urosepsis. Wbc's decreasing. Urinary habits  normalizing. 6. Hyponatremia. Sodium improved to 135 7. Hypertension. Norvasc 5 mg daily, Lopressor 50 mg twice a day. Normotensive presently  8. Constipation. Resolved. Continue MiraLAX as well as Senokot tablets for now. Patient denies any nausea vomiting at this time  9. Hyperlipidemia. Lipitor  10. Chronic renal insufficiency. CR now 1.04 today.  LOS (Days) 5 A FACE TO FACE EVALUATION WAS PERFORMED  Charlett Blake 04/23/2012 9:31 AM

## 2012-04-23 NOTE — Progress Notes (Signed)
Physical Therapy Note  Patient Details  Name: KINYA REVUELTA MRN: [id number] Date of Birth: [birth year] Today's Date: 04/23/2012  1000-1055 (55 minutes) individual Pain: no reported pain/premedicated Focus of treatment: Therapeutic exercises/activities focused on activity tolerance, sit to stand, standing alignment/balance; gait training with RW Treatment: sit to stand from recliner SBA with 3 attempts to RW ; transfers with RW SBA with vcs for brakes; Nustep Level 3 X 10 minutes for activity tolerance; sit to stand from mat X 10 using UEs as assist; standing without AD SBA with min forward trunk lean; gait 80 feet X 2 RW SBA.    Azya Barbero,JIM 04/23/2012, 7:33 AM

## 2012-04-23 NOTE — Progress Notes (Signed)
Occupational Therapy Session Note  Patient Details  Name: Erin [name] MRN: [id number] Date of Birth: [birth year]  Today's Date: 04/23/2012 Time: 0800-0856 Time Calculation (min): 56 min  Short Term Goals: Week 1:  OT Short Term Goal 1 (Week 1): STG=LTG overall mod I  Skilled Therapeutic Interventions: ADL-retraining at tub/shower combination with emphasis safe transfers using DEM, discharge planning and practice use of learned energy conservation techniques.   Patient able to complete transfers and progress through activity with only supervision and setup assist.   Patient demonstrates effective use of energy conservation strategies as taught, taking breaks as needed and allowing setup assist which she states her son will also provide post d/c.  No evidence of balance impairment although patient accepts cues to lift her head when ambulating; patient states she has habitually flexed upper torso posture since late adolescence.     Therapy Documentation Precautions:  Precautions Precautions: Fall Precaution Comments: fatigue Restrictions Weight Bearing Restrictions: No  Pain: Pain Assessment Pain Assessment: 0-10 Pain Score:   6 Pain Location: Leg Pain Descriptors: Pressure Pain Onset: Awakened from sleep Patients Stated Pain Goal: 0 Pain Intervention(s): Medication (See eMAR)  See FIM for current functional status  Therapy/Group: Individual Therapy  Maryan Puls 04/23/2012, 8:57 AM

## 2012-04-24 ENCOUNTER — Inpatient Hospital Stay (HOSPITAL_COMMUNITY): Payer: Medicare PPO | Admitting: Occupational Therapy

## 2012-04-24 ENCOUNTER — Inpatient Hospital Stay (HOSPITAL_COMMUNITY): Payer: Medicare PPO | Admitting: Physical Therapy

## 2012-04-24 DIAGNOSIS — R5381 Other malaise: Secondary | ICD-10-CM

## 2012-04-24 DIAGNOSIS — A4189 Other specified sepsis: Secondary | ICD-10-CM

## 2012-04-24 NOTE — Progress Notes (Signed)
Physical Therapy Note  Patient Details  Name: Erin [name] MRN: [id number] Date of Birth: 02-Sep-1928 Today's Date: 04/24/2012  1430-1510 (40 minutes) individual Pain: 7/10 RT LE/ nurse notified /pain meds given Focus of treatment: Therapeutic exercises bilateral LE strengthening (review home exercise program) Treatment: Nustep Level 4 X 10 minutes for general activity tolerance; bilateral LE strengthening (written, illustrated home exercise program)- standing (using parallel bar for support) X 10 bilateral LE strengthening - hip flexion, hip extension, knee flexion, hip abduction; gait to room RW SBA  to modified independent 120 feet.    Mahina Salatino,JIM 04/24/2012, 7:36 AM

## 2012-04-24 NOTE — Discharge Summary (Signed)
  Discharge summary job # [id number]

## 2012-04-24 NOTE — Progress Notes (Signed)
Physical Therapy Note  Patient Details  Name: Erin [name] MRN: [id number] Date of Birth: [birth year] Today's Date: 04/24/2012  Time: 1100-1155 55 minutes  No c/o pain.  Pt c/o fatigue after OT session, required prolonged rest breaks throughout treatment.  Pt performed car transfers, gait in controlled and household environments, ramp/curb training with RW all with mod I.  Pt educated on and expressed understanding of PT recommendations for use of RW at all times in the house and community and for follow up HHPT to improve strength and balance.  Stair negotiation with mod I x 5 stairs with 2 handrails.  Review of HEP for supine/seated/standing therex to promote activity tolerance and mm strength.  Pt reports she feels safe and ready for d/c home tomorrow.  Individual therapy   Otilio Groleau 04/24/2012, 12:22 PM

## 2012-04-24 NOTE — Progress Notes (Signed)
Occupational Therapy Session Note  Patient Details  Name: Erin [name] MRN: [id number] Date of Birth: Jan 28, 1929  Today's Date: 04/24/2012 Time: 1004-1100 Time Calculation (min): 56 min  Short Term Goals: Week 1:  OT Short Term Goal 1 (Week 1): STG=LTG overall mod I  Skilled Therapeutic Interventions/Progress Updates:    Martin Majestic down to ADL apartment for shower.  Performed shower using tub bench and RW.  Pt overall supervision for transfer in and out of shower, modified independent for all bathing and dressing tasks sit to stand.  Pt needing occasional rest breaks of less than 1 min while performing toilet transfer in her room and when gathering clothing prior to the shower.  O2 sats at 99% on room air and HR at 75 BPM during session.  Educated pt to not have throw rugs in the bathroom especially in front of the shower and to use a towel to dry off her feet, and then remove it before standing.  Needs min instructional cueing to stand up as tall as she can and to not look down at the floor.  Pt states that her niece can provide initial supervision for shower transfers at home. Pt still exhibits slight flexed trunk and neck with mobility.    Therapy Documentation Precautions:  Precautions Precautions: Fall Precaution Comments: fatigue Restrictions Weight Bearing Restrictions: No  Vital Signs: Therapy Vitals Pulse Rate: 75  BP: 125/70 mmHg Oxygen Therapy SpO2: 99 % O2 Device: None (Room air) Pulse Oximetry Type: Intermittent Pain: Pain Assessment Pain Assessment: No/denies pain  ADL: See FIM for current functional status  Therapy/Group: Individual Therapy  Taegen Lennox OTR/L  04/24/2012, 12:40 PM

## 2012-04-24 NOTE — Progress Notes (Signed)
Patient ID: Erin [name], female   DOB: 03/14/1929, 79 y.o.   MRN: [id number] Subjective/Complaints: Feeling stronger. Slept well last night. Denies pain this am  A 12 point review of systems has been performed and if not noted above is otherwise negative.  Objective: Vital Signs: Blood pressure 143/68, pulse 78, temperature 97.8 F (36.6 C), temperature source Oral, resp. rate 17, height 5\' 10"  (1.778 m), weight 103.7 kg (228 lb 9.9 oz), SpO2 97.00%. No results found. No results found for this basename: WBC:2,HGB:2,HCT:2,PLT:2 in the last 72 hours No results found for this basename: NA:2,K:2,CL:2,CO:2,GLUCOSE:2,BUN:2,CREATININE:2,CALCIUM:2 in the last 72 hours CBG (last 3)  No results found for this basename: GLUCAP:3 in the last 72 hours  Wt Readings from Last 3 Encounters:  04/19/12 103.7 kg (228 lb 9.9 oz)  04/17/12 99.5 kg (219 lb 5.7 oz)  04/10/12 107.7 kg (237 lb 7 oz)    Physical Exam:   Blood pressure 145/72, pulse 87, temperature 97.7 F (36.5 C), temperature source Oral, resp. rate 18, height 5' 10.5" (1.791 m), weight 99.5 kg (219 lb 5.7 oz), SpO2 99.00%.  Physical Exam  Vitals reviewed.  Constitutional: She is oriented to person, place, and time.  HENT:  Head: Normocephalic.  Eyes:  Pupils round and reactive to light  Neck: Normal range of motion. Neck supple. No thyromegaly present.  Cardiovascular: Normal rate and regular rhythm. No murmur. Edema trace to 1+ in each leg  Pulmonary/Chest: Effort normal and breath sounds normal. No respiratory distress.  Abdominal: Soft. Bowel sounds are normal. She exhibits no distension. There is no tenderness. There is no rebound.  Neurological: She is alert and oriented to person, place, and time. She displays normal reflexes. No cranial nerve deficit. Coordination normal. +SLR on R side Follow simple commands. She was able to name person place and date of birth. Strength 4/5 proximally. LES  3+ to 4 proximally to 4+/5 distally.  Distracted.  Skin: Skin is warm and dry.  Psychiatric: She has a normal mood and affect. Her behavior is normal. Judgment and thought content normal    Assessment/Plan: 1. Functional deficits secondary to deconditioning after multiple medical issues over 2 hospitalizations which require 3+ hours per day of interdisciplinary therapy in a comprehensive inpatient rehab setting. Physiatrist is providing close team supervision and 24 hour management of active medical problems listed below. Physiatrist and rehab team continue to assess barriers to discharge/monitor patient progress toward functional and medical goals. FIM: FIM - Bathing Bathing Steps Patient Completed: Chest;Right Arm;Left Arm;Abdomen;Front perineal area;Buttocks;Right upper leg;Left upper leg;Right lower leg (including foot);Left lower leg (including foot) Bathing: 5: Set-up assist to: Obtain items  FIM - Upper Body Dressing/Undressing Upper body dressing/undressing steps patient completed: Thread/unthread right sleeve of pullover shirt/dresss;Thread/unthread left sleeve of pullover shirt/dress;Put head through opening of pull over shirt/dress;Pull shirt over trunk Upper body dressing/undressing: 5: Set-up assist to: Obtain clothing/put away FIM - Lower Body Dressing/Undressing Lower body dressing/undressing steps patient completed: Thread/unthread right underwear leg;Thread/unthread left underwear leg;Pull underwear up/down;Thread/unthread right pants leg;Thread/unthread left pants leg;Pull pants up/down;Don/Doff right sock;Don/Doff left sock;Don/Doff right shoe;Don/Doff left shoe Lower body dressing/undressing: 5: Set-up assist to: Obtain clothing  FIM - Toileting Toileting steps completed by patient: Adjust clothing prior to toileting;Performs perineal hygiene;Adjust clothing after toileting Toileting Assistive Devices: Grab bar or rail for support Toileting: 4: Steadying assist  FIM - Engineer, structural Devices: Grab bars;Walker Toilet Transfers: 5-To toilet/BSC: Supervision (verbal cues/safety issues)  FIM - Engineer, site  Assistive Devices: Copy: 5: Supine > Sit: Supervision (verbal cues/safety issues);5: Sit > Supine: Supervision (verbal cues/safety issues);5: Bed > Chair or W/C: Supervision (verbal cues/safety issues)  FIM - Locomotion: Wheelchair Distance: 75 Locomotion: Wheelchair: 0: Activity did not occur FIM - Locomotion: Ambulation Locomotion: Ambulation Assistive Devices: Administrator Ambulation/Gait Assistance: 4: Min guard Locomotion: Ambulation: 2: Travels 50 - 149 ft with supervision/safety issues  Comprehension Comprehension Mode: Auditory Comprehension: 5-Understands complex 90% of the time/Cues < 10% of the time  Expression Expression Mode: Verbal Expression: 6-Expresses complex ideas: With extra time/assistive device  Social Interaction Social Interaction: 6-Interacts appropriately with others with medication or extra time (anti-anxiety, antidepressant).  Problem Solving Problem Solving: 5-Solves complex 90% of the time/cues < 10% of the time  Memory Memory: 5-Recognizes or recalls 90% of the time/requires cueing < 10% of the time  Medical Problem List and Plan:  1. Deconditioning after multiple prolonged medical issues  2. DVT Prophylaxis/Anticoagulation: Subcutaneous Lovenox. Monitor platelet counts any signs of bleeding  3. Pain Management/chronic back pain.Also with sciatica trial gabapentin  qhs , may benefit from [facility] but would need MRI first   Hydrocodone as needed as well as Flexeril of which she was on prior to admission.   Provided K. pad for comfort as needed. Limit sedating meds as possible reduce tofranil.  4. Neuropsych: This patient is capable of making decisions on his/her own behalf.  5. Recent urinary tract infection/urosepsis. Wbc's decreasing. Urinary habits near normal. 6.  Hyponatremia. Sodium improved to 135 7. Hypertension. Norvasc 5 mg daily, Lopressor 50 mg twice a day. Normotensive presently  8. Constipation. Having regular bm's. Continue MiraLAX as well as Senokot tablets for now. Patient denies any nausea vomiting at this time  9. Hyperlipidemia. Lipitor  10. Chronic renal insufficiency. I's and O's fairly balanced. Recheck tomorrow  LOS (Days) 6 A FACE TO FACE EVALUATION WAS PERFORMED  SWARTZ,ZACHARY T 04/24/2012 8:33 AM

## 2012-04-24 NOTE — Progress Notes (Signed)
Occupational Therapy Session Note  Patient Details  Name: Erin [name] MRN: [id number] Date of Birth: [birth year]  Today's Date: 04/24/2012 Time: 1350-1416 Time Calculation (min): 26 min  Short Term Goals: Week 1:  OT Short Term Goal 1 (Week 1): STG=LTG overall mod I  Skilled Therapeutic Interventions/Progress Updates:    Pt seen for OT treatment.  Ambulated to the OT apartment with close supervision using the RW.  Once in the apartment pt worked on making up the bed using her RW.  Needed min instructional cueing for positioning of walker when attempting to reach out in front and adjust the bed sheet.  Pt needed one rest break during the task after standing for approximately 5 mins.  O2 Sats remained greater than 98% with HR at 73-78 after standing.  Educated pt on taking rest breaks as needed by sitting on the edge of the bed or a nearby chair.  Overall pt able to perform the task with distant supervision.  Therapy Documentation Precautions:  Precautions Precautions: Fall Precaution Comments: fatigue Restrictions Weight Bearing Restrictions: No  Vital Signs: Pulse Rate: 66  Patient Position, if appropriate: Sitting Oxygen Therapy SpO2: 99 % O2 Device: None (Room air) Pulse Oximetry Type: Intermittent Pain: Pain Assessment Pain Assessment: Faces Pain Score:   2 Faces Pain Scale: Hurts a little bit Pain Type: Acute pain Pain Location: Hip Pain Orientation: Right Pain Descriptors: Aching Pain Onset: With Activity Patients Stated Pain Goal: 2 Pain Intervention(s): Ambulation/increased activity;Emotional support ADL: See FIM for current functional status  Therapy/Group: Individual Therapy  Samari Gorby OTR/L 04/24/2012, 4:14 PM

## 2012-04-24 NOTE — Progress Notes (Signed)
Physical Therapy Discharge Summary  Patient Details  Name: Erin [name] MRN: [id number] Date of Birth: 04-13-1928  Today's Date: 04/24/2012  Patient has met 6 of 6 long term goals due to improved activity tolerance, improved balance and increased strength.  Patient to discharge at an ambulatory level Modified Independent.   Pt is Mod I with RW for short distance gait, balance, and household mobility.  Pt limited by decreased activity tolerance, requiring frequent rest breaks during activity.  Reasons goals not met: n/a  Recommendation:  Patient will benefit from ongoing skilled PT services in home health setting to continue to advance safe functional mobility, address ongoing impairments in activity tolerance, gait, strength, and minimize fall risk.  Equipment: No equipment provided, pt owns RW.  Reasons for discharge: treatment goals met and discharge from hospital  Patient/family agrees with progress made and goals achieved: Yes  PT Discharge   Cognition Overall Cognitive Status: Appears within functional limits for tasks assessed Sensation Sensation Light Touch: Appears Intact Proprioception: Appears Intact Coordination Gross Motor Movements are Fluid and Coordinated: Yes Motor  Motor Motor: Within Functional Limits   Trunk/Postural Assessment  Cervical Assessment Cervical Assessment:  (fwd head) Thoracic Assessment Thoracic Assessment:  (mild kyphosis) Lumbar Assessment Lumbar Assessment:  (fwd flexed trunk in standing) Postural Control Postural Control: Within Functional Limits  Balance Static Standing Balance Static Standing - Level of Assistance: 6: Modified independent (Device/Increase time) Dynamic Standing Balance Dynamic Standing - Level of Assistance: 6: Modified independent (Device/Increase time) Extremity Assessment      RLE Assessment RLE Assessment: Within Functional Limits LLE Assessment LLE Assessment: Within Functional Limits  See FIM for  current functional status  DONAWERTH,KAREN 04/24/2012, 2:54 PM

## 2012-04-24 NOTE — Progress Notes (Signed)
Agreed to take scheduled miralax at HS, but declined the need for scheduled senna. LBM 04/23/12. Complains of right hip pain, PRN vicodin given at 2140. Wears SCD's at night. Erin [name]

## 2012-04-25 ENCOUNTER — Telehealth: Payer: Self-pay

## 2012-04-25 LAB — BASIC METABOLIC PANEL
Calcium: 9.7 mg/dL (ref 8.4–10.5)
GFR calc Af Amer: 56 mL/min — ABNORMAL LOW (ref >90)
GFR calc non Af Amer: 48 mL/min — ABNORMAL LOW (ref >90)
Glucose, Bld: 135 mg/dL — ABNORMAL HIGH (ref 70–99)
Sodium: 136 mEq/L (ref 135–145)

## 2012-04-25 MED ORDER — OMEPRAZOLE 20 MG PO CPDR: 20.0000 mg | DELAYED_RELEASE_CAPSULE | Freq: Every day | ORAL | Status: DC

## 2012-04-25 MED ORDER — ASPIRIN 325 MG PO TBEC: 325.0000 mg | DELAYED_RELEASE_TABLET | Freq: Every day | ORAL | Status: DC

## 2012-04-25 MED ORDER — VITAMIN C 500 MG PO TABS: 500.0000 mg | ORAL_TABLET | Freq: Every day | ORAL | Status: DC

## 2012-04-25 MED ORDER — METOPROLOL TARTRATE 50 MG PO TABS: 50.0000 mg | ORAL_TABLET | Freq: Two times a day (BID) | ORAL | Status: DC

## 2012-04-25 MED ORDER — CALCIUM CARBONATE 600 MG PO TABS: 600.0000 mg | ORAL_TABLET | Freq: Two times a day (BID) | ORAL | Status: DC

## 2012-04-25 MED ORDER — ATORVASTATIN CALCIUM 10 MG PO TABS: 10.0000 mg | ORAL_TABLET | Freq: Every day | ORAL | Status: DC

## 2012-04-25 MED ORDER — ADULT MULTIVITAMIN W/MINERALS CH: 1.0000 | ORAL_TABLET | Freq: Every day | ORAL | Status: DC

## 2012-04-25 MED ORDER — AMLODIPINE BESYLATE 5 MG PO TABS: 5.0000 mg | ORAL_TABLET | Freq: Every day | ORAL | Status: DC

## 2012-04-25 MED ORDER — IMIPRAMINE HCL 50 MG PO TABS: 100.0000 mg | ORAL_TABLET | Freq: Every day | ORAL | Status: DC

## 2012-04-25 MED ORDER — GABAPENTIN 100 MG PO CAPS: 100.0000 mg | ORAL_CAPSULE | Freq: Every day | ORAL | Status: DC

## 2012-04-25 MED ORDER — FERROUS SULFATE 325 (65 FE) MG PO TABS: 325.0000 mg | ORAL_TABLET | Freq: Every day | ORAL | Status: DC

## 2012-04-25 MED ORDER — HYDROCODONE-ACETAMINOPHEN 5-325 MG PO TABS: 1.0000 | ORAL_TABLET | Freq: Four times a day (QID) | ORAL | Status: DC | PRN

## 2012-04-25 MED ORDER — CYCLOBENZAPRINE HCL 5 MG PO TABS: 5.0000 mg | ORAL_TABLET | Freq: Two times a day (BID) | ORAL | Status: DC | PRN

## 2012-04-25 MED ORDER — SENNOSIDES-DOCUSATE SODIUM 8.6-50 MG PO TABS: 2.0000 | ORAL_TABLET | Freq: Two times a day (BID) | ORAL | Status: DC

## 2012-04-25 NOTE — Telephone Encounter (Signed)
Erin [name] advise of what Dr. Glori Bickers said

## 2012-04-25 NOTE — Progress Notes (Signed)
Social Work  Discharge Note  The overall goal for the admission was met for:   Discharge location: Yes - home with family to assist as needed  Length of Stay: Yes - 7 days  Discharge activity level: Yes - modified independent  Home/community participation: Yes  Services provided included: MD, RD, PT, OT, RN, TR, Pharmacy and [place]: Medicare and Private Insurance: [place]  Follow-up services arranged: Home Health: PT, OT via [place], DME: tub bench via [facility] and Patient/Family has no preference for HH/DME agencies  Comments (or additional information):  Patient/Family verbalized understanding of follow-up arrangements: Yes  Individual responsible for coordination of the follow-up plan: patient  Confirmed correct DME delivered: Lindzie Boxx 04/25/2012    Zaul Hubers

## 2012-04-25 NOTE — Telephone Encounter (Signed)
Erin [name] PT with [facility] left v/m; Erin [name] had PT home health referral for evaluation; unable to reach pt by multiple phone messages in over one week, has went by home with no response, Erin [name] considers this a case not taken. If need to speak with Erin [name] can call back.

## 2012-04-25 NOTE — Discharge Summary (Signed)
[name], [name] NO.:  [id number]  MEDICAL RECORD NO.:  [id number]  LOCATION:  [id number]                         FACILITY:  [facility]  PHYSICIAN:  Lauraine Rinne, P.A.  DATE OF BIRTH:  08-28-1928  DATE OF ADMISSION:  04/18/2012 DATE OF DISCHARGE:  04/25/2012                              DISCHARGE SUMMARY   DISCHARGE DIAGNOSES: 1. Deconditioning after multi-prolonged medical issues. 2. Subcutaneous Lovenox for deep vein thrombosis prophylaxis. 3. Pain management. 4. Chronic back pain. 5. Recent urine tract infection, resolved. 6. Hyponatremia, resolved. 7. Hypertension. 8. Constipation, resolved. 9. Hyperlipidemia, 10.Chronic renal insufficiency with baseline creatinine 1.69.  HISTORY OF PRESENT ILLNESS:  This is an 79 year old right-handed female with chronic back pain and hypertension.  The patient was recently discharged on April 10, 2012 after urinary tract infection, discharged to home with family assistance.  She was readmitted on April 16, 2012 with nonspecific stomach discomfort as well as nausea and vomiting.  She had no diarrhea but did complain of constipation. Daughter had reported mild altered mentation.  CT of abdomen revealed no acute intra-abdominal abnormalities.  Chest x-ray unremarkable. Followup urine culture showed no growth.  Mild hyponatremia of 126, placed on gentle IV fluids with urine osmolality of 297.  Her sodium level steadily improved.  Bouts of constipation resolved with laxative assistance.  Maintained on subcutaneous Lovenox for DVT prophylaxis. Physical and occupational therapy ongoing for deconditioning and the patient was felt to be a good candidate for inpatient rehab services and was admitted for comprehensive rehab program.  PAST MEDICAL HISTORY:  See discharge diagnoses.  SOCIAL HISTORY:  Lives with son and assistance as needed at mobile home with a ramp entrance.  FUNCTIONAL STATUS:  Functional history prior to  admission was driving, retired, used a cane.  Functional status upon admission to rehab service was minimal assist to ambulate 15 feet with a rolling walker.  PHYSICAL EXAMINATION:  VITAL SIGNS:  Blood pressure 145/72, pulse 87, temperature 97, respirations 18. GENERAL:  This was an alert female, oriented x3.  Pupils round and reactive to light. LUNGS:  Clear to auscultation. CARDIAC:  Regular rate and rhythm. ABDOMEN:  Soft, nontender.  Good bowel sounds.  She moved all extremities.  REHABILITATION HOSPITAL COURSE:  The patient was admitted to inpatient rehab services with therapies initiated on a 3-hour daily basis consisting of physical therapy, occupational therapy, and rehabilitation nursing.  The following issues were addressed during the patient's rehabilitation stay.  Pertaining to Erin [name]'s deconditioning after multiple prolonged medical issues, she continued to participate fully with therapies making progressive gains.  Subcutaneous Lovenox for DVT prophylaxis.  Noted chronic back pain with history of sciatica, placed on Neurontin and monitored as well as hydrocodone.  Recent urinary tract infection resolved.  Latest urine culture no growth.  She had no dysuria or hematuria.  Hyponatremia again improved to 135 and monitored.  Blood pressures monitored on Norvasc and Lopressor with no orthostatic changes.  She did have bouts of constipation that had since resolved with laxative assistance with nausea and vomiting.  Chronic renal insufficiency with baseline creatinine stable on latest creatinine of 1.02.  The patient received weekly collaborative interdisciplinary team  conferences to discuss estimated length of stay, family teaching, and any barriers to her discharge.  She was ambulating 30 feet with handheld assistance.  Strength and endurance continued to improve.  Supine to sit with standby assist.  The patient was able to complete transfers and progress to activities  only supervision and setup assistance for activities of daily living.  Full family teaching was completed.  Plan will be discharged to home with family with ongoing therapies dictated as per rehab services.  DISCHARGE MEDICATIONS: 1. Flexeril 5 mg p.o. t.i.d. as needed muscle spasms. 2. Hydrocodone 5/325 one tablet every 6 hours as needed for pain. 3. Norvasc 5 mg p.o. daily. 4. Aspirin 325 mg p.o. daily. 5. Lipitor 10 mg p.o. daily. 6. Neurontin 100 mg p.o. at bedtime. 7. Tofranil 50 mg p.o. at bedtime. 8. Lopressor 50 mg p.o. b.i.d. 9. Lovaza 1 g p.o. daily. 10.Protonix 40 mg p.o. daily. 11.MiraLax 17 g with 8 ounces of water p.o. b.i.d., hold for loose     stool. 12.Senokot one tablet p.o. b.i.d.  DIET:  Regular.  SPECIAL INSTRUCTIONS:  The patient should follow up with Dr. Glori [name] for medical management and Dr. Alger [name] at the outpatient rehab service office as needed.     Lauraine Rinne, P.A.     DA/MEDQ  D:  04/24/2012  T:  04/25/2012  Job:  [id number]  cc:   Erin [name], M.D. Erin A. Erin Bickers, MD

## 2012-04-25 NOTE — Progress Notes (Signed)
Occupational Therapy Discharge Summary  Patient Details  Name: ENAJAH FARIN MRN: [id number] Date of Birth: [birth year]  Today's Date: 04/25/2012 Time:  -     Patient has met 8 of 8 long term goals due to improved activity tolerance, improved balance and postural control.  Patient to discharge at overall Modified Independent level with RW for bathing, dressing, toileting transfers and basic functional ambulation short distances.  Patient's care partner is independent to provide the necessary physical assistance at discharge for IADLs.  Pt still continues to requires rest breaks due to fatigue and demonstrates lower activity tolerance.   Reasons goals not met: n/a  Recommendation:  Patient will benefit from ongoing skilled OT services in home health setting to continue to advance functional skills in the area of BADL and iADL.  Equipment: No equipment provided  Reasons for discharge: treatment goals met and discharge from hospital  Patient/family agrees with progress made and goals achieved: Yes  OT Discharge Precautions/Restrictions  Precautions Precautions: Fall Restrictions Weight Bearing Restrictions: No   Pain Pain Assessment Pain Assessment: 0-10 Pain Score:   5 Pain Type: Acute pain Pain Location: Hip Pain Orientation: Right Pain Descriptors: Aching Pain Onset: Gradual Patients Stated Pain Goal: 2 Pain Intervention(s): Medication (See eMAR) ADL   Vision/Perception  Vision - History Baseline Vision: Wears glasses all the time Patient Visual Report: No change from baseline Vision - Assessment Eye Alignment: Within Functional Limits Perception Perception: Within Functional Limits Praxis Praxis: Intact  Cognition Overall Cognitive Status: Appears within functional limits for tasks assessed Arousal/Alertness: Awake/alert Orientation Level: Oriented X4 Sensation Sensation Light Touch: Appears Intact Stereognosis: Appears Intact Hot/Cold: Appears  Intact Proprioception: Appears Intact Coordination Gross Motor Movements are Fluid and Coordinated: Yes Fine Motor Movements are Fluid and Coordinated: Yes Motor  Motor Motor: Within Functional Limits Mobility  Transfers Sit to Stand: 6: Modified independent (Device/Increase time) Stand to Sit: 6: Modified independent (Device/Increase time)  Trunk/Postural Assessment  Cervical Assessment Cervical Assessment: Within Functional Limits (fwd head) Thoracic Assessment Thoracic Assessment: Within Functional Limits (mild kyphosis) Lumbar Assessment Lumbar Assessment: Within Functional Limits (fwd flexed trunk in standing) Postural Control Postural Control: Within Functional Limits  Balance Static Standing Balance Static Standing - Level of Assistance: 6: Modified independent (Device/Increase time) Dynamic Standing Balance Dynamic Standing - Level of Assistance: 6: Modified independent (Device/Increase time) Extremity/Trunk Assessment RUE Assessment RUE Assessment: Within Functional Limits LUE Assessment LUE Assessment: Within Functional Limits  See FIM for current functional status  [name] [facility] 04/25/2012, 3:32 PM

## 2012-04-25 NOTE — Progress Notes (Signed)
Patient ID: Erin [name], female   DOB: 11-07-1928, 79 y.o.   MRN: [id number] Subjective/Complaints: Slept well. Excited to go home.  A 12 point review of systems has been performed and if not noted above is otherwise negative.  Objective: Vital Signs: Blood pressure 128/75, pulse 66, temperature 98.3 F (36.8 C), temperature source Oral, resp. rate 19, height 5\' 10"  (1.778 m), weight 103.7 kg (228 lb 9.9 oz), SpO2 96.00%. No results found. No results found for this basename: WBC:2,HGB:2,HCT:2,PLT:2 in the last 72 hours No results found for this basename: NA:2,K:2,CL:2,CO:2,GLUCOSE:2,BUN:2,CREATININE:2,CALCIUM:2 in the last 72 hours CBG (last 3)  No results found for this basename: GLUCAP:3 in the last 72 hours  Wt Readings from Last 3 Encounters:  04/19/12 103.7 kg (228 lb 9.9 oz)  04/17/12 99.5 kg (219 lb 5.7 oz)  04/10/12 107.7 kg (237 lb 7 oz)    Physical Exam:   Blood pressure 145/72, pulse 87, temperature 97.7 F (36.5 C), temperature source Oral, resp. rate 18, height 5' 10.5" (1.791 m), weight 99.5 kg (219 lb 5.7 oz), SpO2 99.00%.  Physical Exam  Vitals reviewed.  Constitutional: She is oriented to person, place, and time.  HENT:  Head: Normocephalic.  Eyes:  Pupils round and reactive to light  Neck: Normal range of motion. Neck supple. No thyromegaly present.  Cardiovascular: Normal rate and regular rhythm. No murmur. Edema trace to 1+ in each leg  Pulmonary/Chest: Effort normal and breath sounds normal. No respiratory distress.  Abdominal: Soft. Bowel sounds are normal. She exhibits no distension. There is no tenderness. There is no rebound.  Neurological: She is alert and oriented to person, place, and time. She displays normal reflexes. No cranial nerve deficit. Coordination normal. +SLR on R side Follow simple commands. She was able to name person place and date of birth. Strength 4/5 proximally. LES  3+ to 4 proximally to 4+/5 distally. Distracted.  Skin: Skin is  warm and dry.  Psychiatric: She has a normal mood and affect. Her behavior is normal. Judgment and thought content normal    Assessment/Plan: 1. Functional deficits secondary to deconditioning after multiple medical issues over 2 hospitalizations which require 3+ hours per day of interdisciplinary therapy in a comprehensive inpatient rehab setting. Physiatrist is providing close team supervision and 24 hour management of active medical problems listed below. Physiatrist and rehab team continue to assess barriers to discharge/monitor patient progress toward functional and medical goals. FIM: FIM - Bathing Bathing Steps Patient Completed: Chest;Right Arm;Left Arm;Abdomen;Front perineal area;Buttocks;Right upper leg;Left upper leg;Right lower leg (including foot);Left lower leg (including foot) Bathing: 6: Assistive device (Comment) (tub bench)  FIM - Upper Body Dressing/Undressing Upper body dressing/undressing steps patient completed: Thread/unthread right bra strap;Thread/unthread left bra strap;Hook/unhook bra;Thread/unthread right sleeve of pullover shirt/dresss;Pull shirt over trunk;Put head through opening of pull over shirt/dress;Thread/unthread left sleeve of pullover shirt/dress Upper body dressing/undressing: 6: More than reasonable amount of time FIM - Lower Body Dressing/Undressing Lower body dressing/undressing steps patient completed: Thread/unthread right pants leg;Pull pants up/down;Thread/unthread left pants leg;Don/Doff right shoe;Don/Doff left shoe Lower body dressing/undressing: 6: More than reasonable amount of time  FIM - Toileting Toileting steps completed by patient: Adjust clothing prior to toileting;Performs perineal hygiene;Adjust clothing after toileting Toileting Assistive Devices: Grab bar or rail for support Toileting: 5: Supervision: Safety issues/verbal cues  FIM - Radio producer Devices: Elevated toilet seat;Grab bars Toilet  Transfers: 6-More than reasonable amt of time  FIM - Control and instrumentation engineer Devices: Adult nurse  Transfer: 6: Bed > Chair or W/C: No assist;6: Chair or W/C > Bed: No assist;6: Sit > Supine: No assist;6: Supine > Sit: No assist  FIM - Locomotion: Wheelchair Distance: 75 Locomotion: Wheelchair: 0: Activity did not occur FIM - Locomotion: Ambulation Locomotion: Ambulation Assistive Devices: Administrator Ambulation/Gait Assistance: 4: Min guard Locomotion: Ambulation: 5: Household Independent - travels 50 - 149 ft independent or modified independent  Comprehension Comprehension Mode: Auditory Comprehension: 5-Understands complex 90% of the time/Cues < 10% of the time  Expression Expression Mode: Verbal Expression: 6-Expresses complex ideas: With extra time/assistive device  Social Interaction Social Interaction: 6-Interacts appropriately with others with medication or extra time (anti-anxiety, antidepressant).  Problem Solving Problem Solving: 5-Solves complex 90% of the time/cues < 10% of the time  Memory Memory: 5-Recognizes or recalls 90% of the time/requires cueing < 10% of the time  Medical Problem List and Plan:  1. Deconditioning after multiple prolonged medical issues  2. DVT Prophylaxis/Anticoagulation: Subcutaneous Lovenox. Monitor platelet counts any signs of bleeding  3. Pain Management/chronic back pain.Also with sciatica trial gabapentin  qhs , may benefit from [facility]  As an outpt. Hydrocodone as needed as well as Flexeril of which she was on prior to admission.   Provided K. pad for comfort as needed. Limit sedating meds as possible.  4. Neuropsych: This patient is capable of making decisions on his/her own behalf.  5. Recent urinary tract infection/urosepsis. Wbc's decreasing. Urinary habits near normal. 6. Hyponatremia. Sodium improved to 135 7. Hypertension. Norvasc 5 mg daily, Lopressor 50 mg twice a day. Normotensive presently  8.  Constipation. Having regular bm's. Continue MiraLAX as well as Senokot tablets at home. 9. Hyperlipidemia. Lipitor  10. Chronic renal insufficiency. I's and O's fairly balanced. Recheck labs pending.  LOS (Days) 7 A FACE TO FACE EVALUATION WAS PERFORMED  Erin [name] T 04/25/2012 7:17 AM

## 2012-04-25 NOTE — Telephone Encounter (Signed)
Thanks for the update - she has a f/u with me at the end of the month and was just released from inpt rehab/ PT

## 2012-04-25 NOTE — Progress Notes (Signed)
Patient discharge to home with son and niece at 36.  Discharge instruction given by Silvestre Mesi, Pa.  Patient and family verbalize understanding with further questions.  Patient escorted off unit by rehab NT.

## 2012-04-27 NOTE — Patient Care Conference (Signed)
Inpatient RehabilitationTeam Conference and Plan of Care Update Date: 04/25/2012   Time: 8:55 AM    Patient Name: Erin [name]      Medical Record Number: [id number]  Date of Birth: [birth year] Sex: Female         Room/Bed: 4147/4147-01 Payor Info: Payor: MEDICARE  Plan: MEDICARE PART A AND B  Product Type: *No Product type*     Admitting Diagnosis: Deconditioned  Admit Date/Time:  04/18/2012  6:33 PM Admission Comments: No comment available   Primary Diagnosis:  Physical deconditioning Principal Problem: Physical deconditioning  Patient Active Problem List   Diagnosis Date Noted  . Physical deconditioning 04/19/2012  . Acute-on-chronic kidney injury 04/16/2012  . Acute renal failure 04/08/2012  . Hyponatremia 04/08/2012  . Generalized weakness 04/08/2012  . UTI (lower urinary tract infection) 04/07/2012  . Dehydration 04/07/2012  . Constipation 04/07/2012  . Colon cancer H/O 04/07/2012  . Sacroiliac joint dysfunction 04/01/2012  . Sciatica 03/31/2012  . Renal insufficiency 07/05/2011  . Low back pain 04/02/2011  . Frequent UTI 11/30/2010  . Other screening mammogram 09/30/2010  . DM2 (diabetes mellitus, type 2) 09/30/2010  . Post-menopausal 09/30/2010  . ABDOMINAL WALL HERNIA 02/26/2010  . ANEMIA, SECONDARY TO ACUTE BLOOD LOSS 08/18/2009  . PRESSURE ULCER HEEL 08/18/2009  . OBESITY 10/31/2007  . HYPERTENSION, ESSENTIAL NOS 10/31/2007  . HYPERLIPIDEMIA 07/26/2006  . PHLEBITIS, SUPERFICIAL LEG VEINS 07/26/2006  . HEMORRHOIDS 07/26/2006  . OSTEOARTHRITIS 07/26/2006  . OSTEOPOROSIS 07/26/2006  . SLEEP DISORDER 07/26/2006    Expected Discharge Date: Expected Discharge Date: 04/25/12  Team Members Present: Physician leading conference: Dr. Alger Simons Social Worker Present: Lennart Pall, LCSW Nurse Present: Other (comment) Violet Baldy Rcom, RN) PT Present: Isabelle Course, PT OT Present: [name], Starling Manns, [place];Roanna Epley, COTA SLP Present: Weston Anna,  SLP     Current Status/Progress Goal Weekly Team Focus  Medical   deconditioning after urosepsis, and multiple medical issues  normalize bowel and bladder function  control hypertension, regulate hyponatremia, monitor renal failure   Bowel/Bladder   patient continent of bowel/bladder; LBM 04/23/2012  remain continent of bowel/bladder  monitor   Swallow/Nutrition/ Hydration             ADL's   mod I   mod I  Mod I, d/c planning   Mobility   mod I  mod I  d/c planning   Communication             Safety/Cognition/ Behavioral Observations            Pain   patient complains of right hip pain at times; vicodin 1 tablet prn  2 or less on scale 0-10  Assess and medicated as needed   Skin   no skin issues  no new skin breakdown while on rehab  monitor    Rehab Goals Patient on target to meet rehab goals: Yes *See Interdisciplinary Assessment and Plan and progress notes for long and short-term goals  Barriers to Discharge: none    Possible Resolutions to Barriers:  none/n/a    Discharge Planning/Teaching Needs:  home with family available to assist if needed, however, reaching mod i goals      Team Discussion:  Pt ready for d/c - good gains and has met modified independent goals  Revisions to Treatment Plan:  None   Continued Need for Acute Rehabilitation Level of Care: The patient requires daily medical management by a physician with specialized training in physical medicine and rehabilitation for the following  conditions: Daily direction of a multidisciplinary physical rehabilitation program to ensure safe treatment while eliciting the highest outcome that is of practical value to the patient.: Yes Daily medical management of patient stability for increased activity during participation in an intensive rehabilitation regime.: Yes Daily analysis of laboratory values and/or radiology reports with any subsequent need for medication adjustment of medical intervention for :  Other (urosepsis, CRI, hyponatremia, htn)  Jovan Colligan 04/27/2012, 8:55 AM

## 2012-05-08 ENCOUNTER — Ambulatory Visit (INDEPENDENT_AMBULATORY_CARE_PROVIDER_SITE_OTHER): Payer: Medicare PPO | Admitting: Family Medicine

## 2012-05-08 ENCOUNTER — Encounter: Payer: Self-pay | Admitting: Family Medicine

## 2012-05-08 VITALS — BP 124/68 | HR 78 | Temp 97.8°F | Ht 70.5 in | Wt 223.8 lb

## 2012-05-08 DIAGNOSIS — N289 Disorder of kidney and ureter, unspecified: Secondary | ICD-10-CM

## 2012-05-08 DIAGNOSIS — D62 Acute posthemorrhagic anemia: Secondary | ICD-10-CM

## 2012-05-08 DIAGNOSIS — E860 Dehydration: Secondary | ICD-10-CM

## 2012-05-08 DIAGNOSIS — I10 Essential (primary) hypertension: Secondary | ICD-10-CM

## 2012-05-08 DIAGNOSIS — E871 Hypo-osmolality and hyponatremia: Secondary | ICD-10-CM

## 2012-05-08 DIAGNOSIS — N390 Urinary tract infection, site not specified: Secondary | ICD-10-CM

## 2012-05-08 DIAGNOSIS — R5381 Other malaise: Secondary | ICD-10-CM

## 2012-05-08 LAB — COMPREHENSIVE METABOLIC PANEL
ALT: 16 U/L (ref 0–35)
AST: 20 U/L (ref 0–37)
Albumin: 3.7 g/dL (ref 3.5–5.2)
BUN: 11 mg/dL (ref 6–23)
Calcium: 9.4 mg/dL (ref 8.4–10.5)
Chloride: 101 mEq/L (ref 96–112)
Potassium: 4.5 mEq/L (ref 3.5–5.1)

## 2012-05-08 LAB — CBC WITH DIFFERENTIAL/PLATELET
Basophils Relative: 0.3 % (ref 0.0–3.0)
Eosinophils Absolute: 0.1 10*3/uL (ref 0.0–0.7)
Lymphocytes Relative: 30.1 % (ref 12.0–46.0)
MCHC: 33 g/dL (ref 30.0–36.0)
Monocytes Relative: 6.7 % (ref 3.0–12.0)
Neutrophils Relative %: 62.2 % (ref 43.0–77.0)
RBC: 4.05 Mil/uL (ref 3.87–5.11)
WBC: 7.4 10*3/uL (ref 4.5–10.5)

## 2012-05-08 NOTE — Assessment & Plan Note (Signed)
Resolved after hosp with multiple illnesses  Lab today  Good fluid intake now

## 2012-05-08 NOTE — Assessment & Plan Note (Signed)
Re check renal prof today but seems to have resolved after uti resolution and tx of dehydration

## 2012-05-08 NOTE — Patient Instructions (Addendum)
I'm glad you are doing better  Keep working with physical therapy to get energy and strength back  Hold off on iron until we get lab results Lab today  If you can give a urine specimen on the way out - please try to  If any uti symptoms develop let me know  Keep well hydrated

## 2012-05-08 NOTE — Progress Notes (Signed)
Subjective:    Patient ID: Erin [name], female    DOB: [birth year], 79 y.o.   MRN: [id number]  HPI Here for f/u after 2 hosp for uti/ back pain - leading to dehydration/ severe constipation and mental status changes as well as renal insuff and electrolyte abn  Was d/c to inpt rehab facility for severe deconditioning after this  Is a lot better now - and PT / OT are coming to her house  Using a walker - all the time at this point  Is still pretty tired   uti was treated No symptoms at all at this point  Drinking plenty of water   Lab Results  Component Value Date   HGBA1C 6.8* 04/08/2012      Dehydration / electrolyte abn and renal insuff all corrected with IV fluids   Chemistry      Component Value Date/Time   NA 136 04/25/2012 0710   K 4.1 04/25/2012 0710   CL 100 04/25/2012 0710   CO2 24 04/25/2012 0710   BUN 16 04/25/2012 0710   CREATININE 1.06 04/25/2012 0710      Component Value Date/Time   CALCIUM 9.7 04/25/2012 0710   ALKPHOS 87 04/19/2012 0640   AST 31 04/19/2012 0640   ALT 28 04/19/2012 0640   BILITOT 0.3 04/19/2012 0640      Lab Results  Component Value Date   WBC 8.5 04/19/2012   HGB 11.5* 04/19/2012   HCT 34.8* 04/19/2012   MCV 83.5 04/19/2012   PLT 183 04/19/2012      Constipation corrected with laxative/ stool softener (thought to be caused from pain med and dehydration) Bowels are moving fine now- daily  No more vomiting and no abdominal pain     For chronic back pain / sciatica - gabapentin Back is doing so/ so - PT and OT are coming in and that is helping  Overall improving  Pain still goes down into her leg   Wt is down 5 lb with bmi of 31    Patient Active Problem List  Diagnosis  . HYPERLIPIDEMIA  . OBESITY  . Anemia  . HYPERTENSION, ESSENTIAL NOS  . PHLEBITIS, SUPERFICIAL LEG VEINS  . HEMORRHOIDS  . ABDOMINAL WALL HERNIA  . PRESSURE ULCER HEEL  . OSTEOARTHRITIS  . OSTEOPOROSIS  . SLEEP DISORDER  . Other screening mammogram  . DM2 (diabetes  mellitus, type 2)  . Post-menopausal  . Frequent UTI  . Low back pain  . Renal insufficiency  . Sciatica  . Sacroiliac joint dysfunction  . UTI (lower urinary tract infection)  . Dehydration  . Constipation  . Colon cancer H/O  . Acute renal failure  . Hyponatremia  . Generalized weakness  . Acute-on-chronic kidney injury  . Physical deconditioning   Past Medical History  Diagnosis Date  . Diabetes mellitus   . Hyperlipidemia   . Osteoarthritis   . Osteoporosis   . Hypertension   . Obesity   . Upper GI bleed     AV malformation/when anticoag  . Sacroiliac joint dysfunction 04/01/2012   Past Surgical History  Procedure Date  . Appendectomy   . Cholecystectomy   . Colon resection 1988    secondary to cancer  . Polypectomy 1998    benign X 2  . Cervicitis 1964    conization   . Total knee arthroplasty 07/2009    right   . Endometrial polyp 05/2000    hyperplasia-laser treatment  . Joint replacement  Rt knee replacement   History  Substance Use Topics  . Smoking status: Former Smoker    Types: Cigarettes    Quit date: 08/14/1962  . Smokeless tobacco: Never Used     Comment: Quit over 10 years ago  . Alcohol Use: No   Family History  Problem Relation Age of Onset  . Stroke Father   . Cancer Brother     lung   Allergies  Allergen Reactions  . Diclofenac Sodium Other (See Comments)    : vomiting  . Fluoxetine Hcl Other (See Comments)     vomiting  . Shellfish Allergy Swelling   Current Outpatient Prescriptions on File Prior to Visit  Medication Sig Dispense Refill  . amLODipine (NORVASC) 5 MG tablet Take 1 tablet (5 mg total) by mouth daily.  30 tablet  1  . aspirin 325 MG EC tablet Take 1 tablet (325 mg total) by mouth daily.      Marland Kitchen atorvastatin (LIPITOR) 10 MG tablet Take 1 tablet (10 mg total) by mouth daily.  30 tablet  1  . calcium carbonate (OS-CAL) 600 MG TABS Take 1 tablet (600 mg total) by mouth 2 (two) times daily with a meal.  60 tablet     . cyclobenzaprine (FLEXERIL) 5 MG tablet Take 1 tablet (5 mg total) by mouth 2 (two) times daily as needed for muscle spasms.  60 tablet  0  . gabapentin (NEURONTIN) 100 MG capsule Take 1 capsule (100 mg total) by mouth at bedtime.  30 capsule  1  . HYDROcodone-acetaminophen (NORCO/VICODIN) 5-325 MG per tablet Take 1 tablet by mouth every 6 (six) hours as needed.  60 tablet  0  . imipramine (TOFRANIL) 50 MG tablet Take 2 tablets (100 mg total) by mouth at bedtime.  30 tablet  1  . metoprolol (LOPRESSOR) 50 MG tablet Take 1 tablet (50 mg total) by mouth 2 (two) times daily.  60 tablet  1  . Multiple Vitamin (MULTIVITAMIN WITH MINERALS) TABS Take 1 tablet by mouth daily.      Marland Kitchen omeprazole (PRILOSEC) 20 MG capsule Take 1 capsule (20 mg total) by mouth daily.  30 capsule  1  . senna-docusate (SENOKOT-S) 8.6-50 MG per tablet Take 2 tablets by mouth 2 (two) times daily.  120 tablet  0  . vitamin C (ASCORBIC ACID) 500 MG tablet Take 1 tablet (500 mg total) by mouth daily.  30 tablet  1    Review of Systems Review of Systems  Constitutional: Negative for fever, appetite change, and unexpected weight change. pos for fatigue that is improving  Eyes: Negative for pain and visual disturbance.  Respiratory: Negative for cough and shortness of breath.   Cardiovascular: Negative for cp or palpitations    Gastrointestinal: Negative for nausea, diarrhea and constipation.  Genitourinary: Negative for urgency and frequency.  Skin: Negative for pallor or rash  MSK pos for generalized weakness that is improving with PT  Neurological: Negative for weakness, light-headedness, numbness and headaches.  Hematological: Negative for adenopathy. Does not bruise/bleed easily.  Psychiatric/Behavioral: Negative for dysphoric mood. The patient is not nervous/anxious.         Objective:   Physical Exam  Constitutional: She appears well-developed and well-nourished. No distress.  HENT:  Head: Normocephalic and  atraumatic.  Mouth/Throat: Oropharynx is clear and moist.  Eyes: Conjunctivae normal and EOM are normal. Pupils are equal, round, and reactive to light. Right eye exhibits no discharge. Left eye exhibits no discharge.  Neck: Normal range  of motion. Neck supple. No JVD present. Carotid bruit is not present. No thyromegaly present.  Cardiovascular: Normal rate, regular rhythm, normal heart sounds and intact distal pulses.   Pulmonary/Chest: Effort normal and breath sounds normal. No respiratory distress. She has no wheezes.       No crackles  Abdominal: Soft. Bowel sounds are normal. She exhibits no distension, no abdominal bruit and no mass. There is no tenderness.       No suprapubic tenderness or fullness    Musculoskeletal: She exhibits no edema and no tenderness.       No cva tenderness   Lymphadenopathy:    She has no cervical adenopathy.  Neurological: She is alert. She has normal reflexes. No cranial nerve deficit. She exhibits normal muscle tone. Coordination normal.  Skin: Skin is warm and dry. No rash noted. No erythema. No pallor.  Psychiatric: She has a normal mood and affect.       In good spirits          Assessment & Plan:

## 2012-05-08 NOTE — Assessment & Plan Note (Signed)
Cbc today Lab Results  Component Value Date   WBC 8.5 04/19/2012   HGB 11.5* 04/19/2012   HCT 34.8* 04/19/2012   MCV 83.5 04/19/2012   PLT 183 04/19/2012   Pt given iron but has not started due to poss of consiptation Will check cbc and let her know what to do

## 2012-05-08 NOTE — Assessment & Plan Note (Signed)
When pt was acutely sick- normalized in hosp after fluids Renal panel today

## 2012-05-08 NOTE — Assessment & Plan Note (Signed)
Much improved after inpt rehab and now home PT (also imp in back pain )  Using walker Not driving yet Much family support/help

## 2012-05-08 NOTE — Assessment & Plan Note (Signed)
bp in fair control at this time  No changes needed  Disc lifstyle change with low sodium diet and exercise  Lab today 

## 2012-05-08 NOTE — Assessment & Plan Note (Signed)
Re check ua today  Pt recently hosp

## 2012-05-09 ENCOUNTER — Telehealth (INDEPENDENT_AMBULATORY_CARE_PROVIDER_SITE_OTHER): Payer: Medicare PPO | Admitting: Family Medicine

## 2012-05-09 DIAGNOSIS — N390 Urinary tract infection, site not specified: Secondary | ICD-10-CM

## 2012-05-09 LAB — POCT URINALYSIS DIPSTICK
Blood, UA: NEGATIVE
Protein, UA: NEGATIVE
Spec Grav, UA: 1.02
Urobilinogen, UA: 0.2

## 2012-05-09 LAB — POCT UA - MICROSCOPIC ONLY
Crystals, Ur, HPF, POC: 0
Yeast, UA: 0

## 2012-05-09 NOTE — Telephone Encounter (Signed)
Pt.notified

## 2012-05-09 NOTE — Addendum Note (Signed)
Addended by: Tammi Sou on: 05/09/2012 08:49 AM   Modules accepted: Orders

## 2012-05-09 NOTE — Telephone Encounter (Signed)
Please let her know that ua is negative- that is reassuring

## 2012-05-10 ENCOUNTER — Telehealth: Payer: Self-pay | Admitting: Family Medicine

## 2012-05-10 NOTE — Telephone Encounter (Signed)
Pt is taking the norco when she gets the HA, she isn't having any other symptoms (no nausea, no numbness, and no weakness), and pt is still getting enough fluids

## 2012-05-10 NOTE — Telephone Encounter (Signed)
Patient Information:  Caller Name: Kamiryn  Phone: [phone]  Patient: Erin [name], Erin [name]  Gender: Female  DOB: Sep 25, 1928  Age: 79 Years  PCP: [facility], [facility]or, [facility])  Office Follow Up:  Does the office need to follow up with this patient?: Yes  Instructions For The Office: Please follow up with patient triaged to be seen today due to new headache and weakened immune system.  Discharged 05/01/12 from hospital for dehydration and was on steroids.  Pt is 83.  No signs of dehydration at this time.  RN Note:  Has had headache on and off since this morning.  Currently is not having  pain, but every once in a while will have shooting pains through her head.  Rates pain when it happens as 9-10 on pain scale.  Last about 4-5 seconds.  Intake is normal for patient; Last BM was normal for patient-and still loose having no more than 2 stool per day and has had none today.  Patient is a see to day but there are not more appointments available.  Will send message to office.  Initial call was to see what she could take for headache with medications she takes.  Patient is on no blood thinner.  Other medication with pain reliever contain acetaminophen or is a baby aspirin. RN suggested to have niece verify as well that there is no unknown conflict when she picks it up at patient's drug store with her pharmacist.  Symptoms  Reason For Call & Symptoms: Calling about headache.  Want to know what she can take.  Reviewed Health History In EMR: Yes  Reviewed Medications In EMR: Yes  Reviewed Allergies In EMR: Yes  Reviewed Surgeries / Procedures: Yes  Date of Onset of Symptoms: 05/10/2012  Guideline(s) Used:  Headache  Disposition Per Guideline:   See Today in Office  Reason For Disposition Reached:   New headache and weak immune system (e.g., HIV positive, cancer chemotherapy, chronic steroid treatment)  Advice Given: Call back if get worse

## 2012-05-10 NOTE — Telephone Encounter (Signed)
Pt notified of Dr. Marliss Coots recommendation, pt said HA has stopped but if continues will call back to scheduled a f/u

## 2012-05-10 NOTE — Telephone Encounter (Signed)
What is she taking right now for headache? - she has norco on her list - is she taking that?  Any other symptoms including nausea/ numbness weakenss?  Is she able to get in enough fluids?

## 2012-05-10 NOTE — Telephone Encounter (Signed)
If headache gets worse again (if severe or other symptoms go to ER)- otherwise she can try a one time dose of 400 mg ibuprofen otc   (not more than this as this med affects kidney) Keep drinking lots of fluids and if headache persists f/u tomorrow or friday

## 2012-05-11 ENCOUNTER — Other Ambulatory Visit: Payer: Self-pay | Admitting: *Deleted

## 2012-05-11 DIAGNOSIS — M545 Low back pain: Secondary | ICD-10-CM

## 2012-05-11 MED ORDER — HYDROCODONE-ACETAMINOPHEN 5-325 MG PO TABS: 1.0000 | ORAL_TABLET | Freq: Four times a day (QID) | ORAL | Status: DC | PRN

## 2012-05-11 NOTE — Telephone Encounter (Signed)
Please check in with her about headache and also back pain- ask her how much/ often she is taking the pain med and is it more for headache or back pain? thanks

## 2012-05-11 NOTE — Telephone Encounter (Signed)
Rx called in as prescribed, pt notified of Dr. Marliss Coots recommendation and agrees with referral, I advise pt that Erin [name] will call her back to set up ortho appt

## 2012-05-11 NOTE — Telephone Encounter (Signed)
I will refil her medicine but am worried about the severity of her back pain and the poss of dependence on the narcotic med as well as return of the constipation  I would like her to see her orthopedist about the back pain - see if agreeable and I will do ref Px written for call in

## 2012-05-11 NOTE — Telephone Encounter (Signed)
Last filled 04/25/12 for #[id number]

## 2012-05-11 NOTE — Telephone Encounter (Signed)
Pt said she is taking Rx for her back pain, she is taking 1 tab every 6 hrs as needed but she has been needing them every 6 hours lately because back pain is bad, so pt is taking around 4 tab every day, pt said pain will stop for about 3-4 hrs after taking meds then it comes right back

## 2012-05-11 NOTE — Telephone Encounter (Signed)
Orthopedic ref

## 2012-05-11 NOTE — Addendum Note (Signed)
Addended by: [name] A on: 05/11/2012 04:57 PM   Modules accepted: Orders

## 2012-05-13 ENCOUNTER — Ambulatory Visit: Payer: Self-pay | Admitting: Family Medicine

## 2012-05-15 ENCOUNTER — Telehealth: Payer: Self-pay | Admitting: Family Medicine

## 2012-05-15 NOTE — Telephone Encounter (Signed)
[facility] Triage Call Report Triage Record Num: [id number] Operator: Gordy Clement Patient Name: Erin [name] Call Date & Time: 05/13/2012 10:21:21AM Patient Phone: [phone] PCP: Erin [name]. Tower Patient Gender: Female PCP Fax : Patient DOB: 04-15-1928 Practice Name: [place] Reason for Call: Caller: Erin [name]/Patient; PCP: Erin [name] [facility]); CB#: [phone]; Call regarding Back Pain; Has Neurosurgeon appt. Continuous pain to right buttock going down side of right leg to ankle. HA to left side of head that started last week off and on; last night and this am comes and goes. No sinus issues. Nauseated off and on. Pain in back kept her from sleeping well last night. Has Hydrocodone 5/325, 1 q 6hrs with last at 03am; Flexeril last at about 05am. Pain at time of call still about a 6/10, meds did bring some relief. Pain is a dull ache that gets more intense with a sharp occassional pain. Walking is unchanged. Standing straight. Patient most concerned with her Erin [name]: Pain sometimes starts to left frontal lobe area and sometimes in the back. Will have just a shooting pain, only lasting for a second, comes and goes but is severe. Afebrile. Per Back Symptoms protocol all emergent symtpoms ruled out with exception of "Mild to moderate pain in back with normal activity or rest and not responding to 72 ours of home care." Per Ha Protocol "New onset of severe headache unrelieved with 4 hours of home care" Appointment scheduled for 2/1 at 11:30 am. Care advice given. Protocol(s) Used: Back Symptoms Recommended Outcome per Protocol: See Provider within 72 Hours Reason for Outcome: Mild to moderate pain in back with normal activity or rest AND not responding to 72 hours of home care Care Advice: ~ Call provider if symptoms worsen or new symptoms develop. ~ SYMPTOM / CONDITION MANAGEMENT 05/13/2012 10:43:35AM Page 1 of 1 [id number] [facility] Triage Call Report Triage  Record Num: [id number] Operator: Gordy Clement Patient Name: Erin [name] Call Date & Time: 05/13/2012 10:21:21AM Patient Phone: [phone] PCP: Erin [name]. Tower Patient Gender: Female PCP Fax : Patient DOB: 29-Sep-1928 Practice Name: [name] Reason for Call: Caller: Erin [name]/Patient; PCP: Erin [name] [facility]); CB#: [phone]; Call regarding Back Pain; Has Neurosurgeon appt. Continuous pain to right buttock going down side of right leg to ankle. HA to left side of head that started last week off and on; last night and this am comes and goes. No sinus issues. Nauseated off and on. Pain in back kept her from sleeping well last night. Has Hydrocodone 5/325, 1 q 6hrs with last at 03am; Flexeril last at about 05am. Pain at time of call still about a 6/10, meds did bring some relief. Pain is a dull ache that gets more intense with a sharp occassional pain. Walking is unchanged. Standing straight. Patient most concerned with her Erin [name]: Pain sometimes starts to left frontal lobe area and sometimes in the back. Will have just a shooting pain, only lasting for a second, comes and goes but is severe. Afebrile. Per Back Symptoms protocol all emergent symtpoms ruled out with exception of "Mild to moderate pain in back with normal activity or rest and not responding to 72 ours of home care." Per Ha Protocol "New onset of severe headache unrelieved with 4 hours of home care" Appointment scheduled for 2/1 at 11:30 am. Care advice given. Protocol(s) Used: Headache Recommended Outcome per Protocol: See Provider within 4 hours Reason for Outcome: New onset of severe headache unrelieved with 4 hours of home care  Care Advice: ~ Another adult should drive. ~ SYMPTOM / CONDITION MANAGEMENT

## 2012-05-15 NOTE — Telephone Encounter (Signed)
Yes-please do follow up if symptoms return

## 2012-05-15 NOTE — Telephone Encounter (Signed)
Pt said [facility] called her and told her that they recommend her going back to ER instead of the [facility], pt said she started feeling better so didn't go to ER, pt said she is doing ok and declined a f/u appt with Dr. Glori Bickers, I advise pt if HA or pain comes back to call and schedule an appt with Dr. Glori Bickers, pt verbalized understanding

## 2012-05-15 NOTE — Telephone Encounter (Signed)
From this note it looks like she had appt for 2/1 at 11:30 but I do not see it in the chart- can you please call her so see if she went anywhere? thanks

## 2012-05-19 ENCOUNTER — Other Ambulatory Visit: Payer: Self-pay | Admitting: *Deleted

## 2012-05-19 MED ORDER — SENNOSIDES-DOCUSATE SODIUM 8.6-50 MG PO TABS: 2.0000 | ORAL_TABLET | Freq: Two times a day (BID) | ORAL | Status: DC

## 2012-05-19 NOTE — Telephone Encounter (Signed)
Yes- can have 6 mo of refils, thanks

## 2012-05-19 NOTE — Telephone Encounter (Signed)
done

## 2012-05-19 NOTE — Telephone Encounter (Signed)
Pt given rx for senna while recently in hosp, is it ok to refill?

## 2012-05-24 NOTE — Telephone Encounter (Signed)
Pt called to ck status of refill on Senokot S. [facility] said rx ready for pick up. Pt advised.

## 2012-05-29 ENCOUNTER — Telehealth: Payer: Self-pay

## 2012-05-29 NOTE — Telephone Encounter (Signed)
Erin [name] PT with [facility] left v/m pt missed PT visit  Last week due to weather. Erin [name] does not need call back.

## 2012-06-01 ENCOUNTER — Telehealth: Payer: Self-pay

## 2012-06-01 DIAGNOSIS — Z5189 Encounter for other specified aftercare: Secondary | ICD-10-CM

## 2012-06-01 DIAGNOSIS — G8929 Other chronic pain: Secondary | ICD-10-CM

## 2012-06-01 DIAGNOSIS — M545 Low back pain: Secondary | ICD-10-CM

## 2012-06-01 DIAGNOSIS — Z87440 Personal history of urinary (tract) infections: Secondary | ICD-10-CM

## 2012-06-01 NOTE — Telephone Encounter (Signed)
Opened in error; Mrs Gulas calling for son.

## 2012-06-21 ENCOUNTER — Other Ambulatory Visit: Payer: Self-pay

## 2012-06-21 MED ORDER — GABAPENTIN 100 MG PO CAPS: 100.0000 mg | ORAL_CAPSULE | Freq: Every day | ORAL | Status: DC

## 2012-06-27 ENCOUNTER — Telehealth: Payer: Self-pay | Admitting: Family Medicine

## 2012-06-27 DIAGNOSIS — D649 Anemia, unspecified: Secondary | ICD-10-CM

## 2012-06-27 DIAGNOSIS — E785 Hyperlipidemia, unspecified: Secondary | ICD-10-CM

## 2012-06-27 DIAGNOSIS — N289 Disorder of kidney and ureter, unspecified: Secondary | ICD-10-CM

## 2012-06-27 DIAGNOSIS — M810 Age-related osteoporosis without current pathological fracture: Secondary | ICD-10-CM

## 2012-06-27 NOTE — Telephone Encounter (Signed)
Message copied by Abner Greenspan on Tue Jun 27, 2012  7:41 AM ------      Message from: Ellamae Sia      Created: Fri Jun 23, 2012 10:07 AM      Regarding: Lab orders for Wednesday, 3.19.14       Patient is scheduled for CPX labs, please order future labs, Thanks , Terri       ------

## 2012-06-28 ENCOUNTER — Other Ambulatory Visit: Payer: Self-pay | Admitting: *Deleted

## 2012-06-28 ENCOUNTER — Ambulatory Visit (INDEPENDENT_AMBULATORY_CARE_PROVIDER_SITE_OTHER): Payer: Medicare PPO | Admitting: Family Medicine

## 2012-06-28 ENCOUNTER — Encounter: Payer: Self-pay | Admitting: Family Medicine

## 2012-06-28 ENCOUNTER — Other Ambulatory Visit (INDEPENDENT_AMBULATORY_CARE_PROVIDER_SITE_OTHER): Payer: Medicare PPO

## 2012-06-28 VITALS — BP 130/64 | HR 97 | Temp 98.4°F | Ht 70.5 in

## 2012-06-28 DIAGNOSIS — B029 Zoster without complications: Secondary | ICD-10-CM

## 2012-06-28 DIAGNOSIS — D649 Anemia, unspecified: Secondary | ICD-10-CM

## 2012-06-28 DIAGNOSIS — E119 Type 2 diabetes mellitus without complications: Secondary | ICD-10-CM

## 2012-06-28 DIAGNOSIS — E785 Hyperlipidemia, unspecified: Secondary | ICD-10-CM

## 2012-06-28 LAB — CBC WITH DIFFERENTIAL/PLATELET
Basophils Absolute: 0 10*3/uL (ref 0.0–0.1)
HCT: 34.6 % — ABNORMAL LOW (ref 36.0–46.0)
Lymphs Abs: 1.5 10*3/uL (ref 0.7–4.0)
Monocytes Absolute: 0.6 10*3/uL (ref 0.1–1.0)
Monocytes Relative: 9.4 % (ref 3.0–12.0)
Neutrophils Relative %: 65 % (ref 43.0–77.0)
Platelets: 180 10*3/uL (ref 150.0–400.0)
RDW: 14.5 % (ref 11.5–14.6)
WBC: 6.1 10*3/uL (ref 4.5–10.5)

## 2012-06-28 LAB — LIPID PANEL
HDL: 42.6 mg/dL (ref >39.00)
VLDL: 43.2 mg/dL — ABNORMAL HIGH (ref 0.0–40.0)

## 2012-06-28 LAB — COMPREHENSIVE METABOLIC PANEL
ALT: 15 U/L (ref 0–35)
AST: 20 U/L (ref 0–37)
Chloride: 103 mEq/L (ref 96–112)
Creatinine, Ser: 1.1 mg/dL (ref 0.4–1.2)
Sodium: 137 mEq/L (ref 135–145)
Total Bilirubin: 0.5 mg/dL (ref 0.3–1.2)
Total Protein: 7.6 g/dL (ref 6.0–8.3)

## 2012-06-28 LAB — HEMOGLOBIN A1C: Hgb A1c MFr Bld: 7 % — ABNORMAL HIGH (ref 4.6–6.5)

## 2012-06-28 LAB — LDL CHOLESTEROL, DIRECT: Direct LDL: 56.4 mg/dL

## 2012-06-28 MED ORDER — VALACYCLOVIR HCL 1 G PO TABS: 1000.0000 mg | ORAL_TABLET | Freq: Three times a day (TID) | ORAL | Status: DC

## 2012-06-28 MED ORDER — METOPROLOL TARTRATE 50 MG PO TABS: 50.0000 mg | ORAL_TABLET | Freq: Two times a day (BID) | ORAL | Status: DC

## 2012-06-28 NOTE — Patient Instructions (Addendum)
You have shingles (zoster)  The rash may continue to spread before it stops- keep the area clean and dry  A cool compress is ok if it feels good  Take the valtrex as directed - finish it all  Continue gabapentin- this may help  Take norco for pain as needed (watch out for constipation) Update if not starting to improve in a week or if worsening

## 2012-06-28 NOTE — Assessment & Plan Note (Signed)
L chest wall over breast  24 hours of rash tx with valtrex 1 g tid norco for pain control Already on gabapentin Disc what to expect and handouts given  Update if not starting to improve in a week or if worsening

## 2012-06-28 NOTE — Progress Notes (Signed)
Subjective:    Patient ID: Erin [name], female    DOB: 1928/11/06, 79 y.o.   MRN: [id number]  HPI Here with rash on L breast  Started yesterday  Really not bothering her very much  Some soreness on L side of chest -feels like a muscle ache  Has never had shingles before   No fever or malaise Rash is not weeping but does look like blisters Has not had shingles vaccine   Patient Active Problem List  Diagnosis  . HYPERLIPIDEMIA  . OBESITY  . Anemia  . HYPERTENSION, ESSENTIAL NOS  . PHLEBITIS, SUPERFICIAL LEG VEINS  . HEMORRHOIDS  . ABDOMINAL WALL HERNIA  . PRESSURE ULCER HEEL  . OSTEOARTHRITIS  . OSTEOPOROSIS  . SLEEP DISORDER  . Other screening mammogram  . DM2 (diabetes mellitus, type 2)  . Post-menopausal  . Frequent UTI  . Low back pain  . Renal insufficiency  . Sciatica  . Sacroiliac joint dysfunction  . UTI (lower urinary tract infection)  . Dehydration  . Constipation  . Colon cancer H/O  . Acute renal failure  . Hyponatremia  . Generalized weakness  . Acute-on-chronic kidney injury  . Physical deconditioning   Past Medical History  Diagnosis Date  . Diabetes mellitus   . Hyperlipidemia   . Osteoarthritis   . Osteoporosis   . Hypertension   . Obesity   . Upper GI bleed     AV malformation/when anticoag  . Sacroiliac joint dysfunction 04/01/2012   Past Surgical History  Procedure Laterality Date  . Appendectomy    . Cholecystectomy    . Colon resection  1988    secondary to cancer  . Polypectomy  1998    benign X 2  . Cervicitis  1964    conization   . Total knee arthroplasty  07/2009    right   . Endometrial polyp  05/2000    hyperplasia-laser treatment  . Joint replacement      Rt knee replacement   History  Substance Use Topics  . Smoking status: Former Smoker    Types: Cigarettes    Quit date: 08/14/1962  . Smokeless tobacco: Never Used     Comment: Quit over 10 years ago  . Alcohol Use: No   Family History  Problem Relation  Age of Onset  . Stroke Father   . Cancer Brother     lung   Allergies  Allergen Reactions  . Diclofenac Sodium Other (See Comments)    : vomiting  . Fluoxetine Hcl Other (See Comments)     vomiting  . Shellfish Allergy Swelling   Current Outpatient Prescriptions on File Prior to Visit  Medication Sig Dispense Refill  . amLODipine (NORVASC) 5 MG tablet Take 1 tablet (5 mg total) by mouth daily.  30 tablet  1  . aspirin 325 MG EC tablet Take 1 tablet (325 mg total) by mouth daily.      Marland Kitchen atorvastatin (LIPITOR) 10 MG tablet Take 1 tablet (10 mg total) by mouth daily.  30 tablet  1  . calcium carbonate (OS-CAL) 600 MG TABS Take 1 tablet (600 mg total) by mouth 2 (two) times daily with a meal.  60 tablet    . cyclobenzaprine (FLEXERIL) 5 MG tablet Take 1 tablet (5 mg total) by mouth 2 (two) times daily as needed for muscle spasms.  60 tablet  0  . gabapentin (NEURONTIN) 100 MG capsule Take 1 capsule (100 mg total) by mouth at bedtime.  [place]  capsule  0  . HYDROcodone-acetaminophen (NORCO/VICODIN) 5-325 MG per tablet Take 1 tablet by mouth every 6 (six) hours as needed.  60 tablet  0  . imipramine (TOFRANIL) 50 MG tablet Take 2 tablets (100 mg total) by mouth at bedtime.  30 tablet  1  . metoprolol (LOPRESSOR) 50 MG tablet Take 1 tablet (50 mg total) by mouth 2 (two) times daily.  60 tablet  6  . Multiple Vitamin (MULTIVITAMIN WITH MINERALS) TABS Take 1 tablet by mouth daily.      Marland Kitchen omeprazole (PRILOSEC) 20 MG capsule Take 1 capsule (20 mg total) by mouth daily.  30 capsule  1  . senna-docusate (SENOKOT-S) 8.6-50 MG per tablet Take 2 tablets by mouth 2 (two) times daily.  120 tablet  5  . vitamin C (ASCORBIC ACID) 500 MG tablet Take 1 tablet (500 mg total) by mouth daily.  30 tablet  1   No current facility-administered medications on file prior to visit.    Review of Systems Review of Systems  Constitutional: Negative for fever, appetite change, fatigue and unexpected weight change.  Eyes:  Negative for pain and visual disturbance.  Respiratory: Negative for cough and shortness of breath.   Cardiovascular: Negative for cp or palpitations    Gastrointestinal: Negative for nausea, diarrhea and constipation.  Genitourinary: Negative for urgency and frequency.  Skin: Negative for pallor and pos for rash MSK pos for deep ache over mid back on L side  Neurological: Negative for weakness, light-headedness, numbness and headaches.  Hematological: Negative for adenopathy. Does not bruise/bleed easily.  Psychiatric/Behavioral: Negative for dysphoric mood. The patient is not nervous/anxious.         Objective:   Physical Exam  Constitutional: She appears well-developed and well-nourished. No distress.  HENT:  Head: Normocephalic and atraumatic.  Neck: Normal range of motion. Neck supple.  Cardiovascular: Normal rate and regular rhythm.   Pulmonary/Chest: Breath sounds normal.  Lymphadenopathy:    She has no cervical adenopathy.  Neurological: She is alert.  Skin: Skin is warm and dry. Rash noted.  Rash over L upper breast wrapping around to back on L side  Is vesicular and not weeping with slt surrounding erythema  Is mildly tender  Psychiatric: She has a normal mood and affect.          Assessment & Plan:

## 2012-07-03 ENCOUNTER — Telehealth: Payer: Self-pay | Admitting: Family Medicine

## 2012-07-03 NOTE — Telephone Encounter (Signed)
I will see her and discuss this tomorrow

## 2012-07-03 NOTE — Telephone Encounter (Signed)
Patient Information:  Caller Name: Georganna  Phone: [phone]  Patient: Erin [name], Erin [name]  Gender: Female  DOB: 21-Jan-1929  Age: 79 Years  PCP: [name])  Office Follow Up:  Does the office need to follow up with this patient?: No  Instructions For The Office: N/A   Symptoms  Reason For Call & Symptoms: Patient calling to see what she can take for her h/a.  States that Dr. Glori Bickers told her to take Motrin and another medication that she cannot remember the name of for a h/a but she remembers that when she was in the hospital 3 years ago with a bleeding ulcer that she was told not to take any ASA related products.  She is worried about taking the Motrin.  She was seen last week and dx with the shingles and has a f/u appt. in am 3/25.  She has had an intermittent h/a since 3/21.  She has no h/a pain at this time but it has hurt as bad as 8/10.  States that it just went away on it's own. I reviewed the office visit note from last week.  I did not see any mention of the Motrin but did she that she was given Norco 1 po q6 hours and she is taking same as directed for the shingles pain.  Suggested that she continue this today and discuss the Motrin with the MD tomorrow at her f/u visit.  I asked her to call back if she worsened in anyway prior to her appt. time.  Reviewed Health History In EMR: Yes  Reviewed Medications In EMR: Yes  Reviewed Allergies In EMR: Yes  Reviewed Surgeries / Procedures: Yes  Date of Onset of Symptoms: 06/30/2012  Treatments Tried: Norco  Treatments Tried Worked: Yes  Guideline(s) Used:  No Protocol Available - Information Only  Disposition Per Guideline:   Home Care  Reason For Disposition Reached:   Information only question and nurse able to answer  Advice Given:  N/A  Patient Will Follow Care Advice:  YES

## 2012-07-05 ENCOUNTER — Ambulatory Visit (INDEPENDENT_AMBULATORY_CARE_PROVIDER_SITE_OTHER): Payer: Medicare PPO | Admitting: Family Medicine

## 2012-07-05 ENCOUNTER — Encounter: Payer: Self-pay | Admitting: Family Medicine

## 2012-07-05 VITALS — BP 116/58 | HR 68 | Temp 96.2°F | Ht 70.5 in | Wt 198.0 lb

## 2012-07-05 DIAGNOSIS — E119 Type 2 diabetes mellitus without complications: Secondary | ICD-10-CM

## 2012-07-05 DIAGNOSIS — D649 Anemia, unspecified: Secondary | ICD-10-CM

## 2012-07-05 DIAGNOSIS — B029 Zoster without complications: Secondary | ICD-10-CM

## 2012-07-05 DIAGNOSIS — Z0000 Encounter for general adult medical examination without abnormal findings: Secondary | ICD-10-CM | POA: Insufficient documentation

## 2012-07-05 DIAGNOSIS — I10 Essential (primary) hypertension: Secondary | ICD-10-CM

## 2012-07-05 DIAGNOSIS — M810 Age-related osteoporosis without current pathological fracture: Secondary | ICD-10-CM

## 2012-07-05 DIAGNOSIS — E785 Hyperlipidemia, unspecified: Secondary | ICD-10-CM

## 2012-07-05 DIAGNOSIS — Z9181 History of falling: Secondary | ICD-10-CM | POA: Insufficient documentation

## 2012-07-05 MED ORDER — HYDROCODONE-ACETAMINOPHEN 5-325 MG PO TABS: 1.0000 | ORAL_TABLET | Freq: Four times a day (QID) | ORAL | Status: DC | PRN

## 2012-07-05 NOTE — Progress Notes (Signed)
Subjective:    Patient ID: Erin [name], female    DOB: 1929/02/16, 79 y.o.   MRN: [id number]  HPI Here for medicare wellness exam and chronic problems    I have personally reviewed the Medicare Annual Wellness questionnaire and have noted 1. The patient's medical and social history 2. Their use of alcohol, tobacco or illicit drugs 3. Their current medications and supplements 4. The patient's functional ability including ADL's, fall risks, home safety risks and hearing or visual             impairment. 5. Diet and physical activities 6. Evidence for depression or mood disorders  The patients weight, height, BMI have been recorded in the chart and visual acuity is per eye clinic.  I have made referrals, counseling and provided education to the patient based review of the above and I have provided the pt with a written personalized care plan for preventive services.  See scanned forms.  Routine anticipatory guidance given to patient.  See health maintenance. Flu 9/13 Shingles- has the disease presently/ was tx -it is drying up and looking much better  Is sore and aching  PNA 12/08 Tetanus 12/08 Colon 9/10 - 5 year recall  Breast cancer screening mammo 8/13  Self exam-no lumps, just shingles  Advance directive-has it all set / POA is designated  Cognitive function addressed- see scanned forms- and if abnormal then additional documentation follows. She does have worse short term memory than she used to, (is also on gabapentin) - family does not notice any major changes  Family is fussing about driving    [place] and [place] reviewed  Meds, vitals, and allergies reviewed.   ROS: See HPI.  Otherwise negative.    Anemia is stable Lab Results  Component Value Date   WBC 6.1 06/28/2012   HGB 11.4* 06/28/2012   HCT 34.6* 06/28/2012   MCV 85.1 06/28/2012   PLT 180.0 06/28/2012    DM a1c is 7.0 No meds presently Past hx of renal insuff with dehydration   Chemistry      Component Value  Date/Time   NA 137 06/28/2012 1147   K 3.8 06/28/2012 1147   CL 103 06/28/2012 1147   CO2 25 06/28/2012 1147   BUN 15 06/28/2012 1147   CREATININE 1.1 06/28/2012 1147      Component Value Date/Time   CALCIUM 9.5 06/28/2012 1147   ALKPHOS 66 06/28/2012 1147   AST 20 06/28/2012 1147   ALT 15 06/28/2012 1147   BILITOT 0.5 06/28/2012 1147      On lipitor for hyperlipidemia Lab Results  Component Value Date   CHOL 158 06/28/2012   CHOL 143 01/07/2012   CHOL 144 03/29/2011   Lab Results  Component Value Date   HDL 42.60 06/28/2012   HDL 43.20 01/07/2012   HDL 48.70 03/29/2011   Lab Results  Component Value Date   LDLCALC 71 01/07/2012   LDLCALC 62 03/29/2011   LDLCALC 51 11/13/2010   Lab Results  Component Value Date   TRIG 216.0* 06/28/2012   TRIG 145.0 01/07/2012   TRIG 165.0* 03/29/2011   Lab Results  Component Value Date   CHOLHDL 4 06/28/2012   CHOLHDL 3 01/07/2012   CHOLHDL 3 03/29/2011   Lab Results  Component Value Date   LDLDIRECT 56.4 06/28/2012   LDLDIRECT 165.7 09/23/2010   LDLDIRECT 110.1 03/23/2010   trig are up   Osteopenia dexa 7/12 with T of -1.7 in FN Ca and D  Walks  with a cane  She has had one fall - leaned too far forward and fell forward - no major  No rugs in the house - careful about that   Despite having a rough year - she has a good mood and no depression (was down in the hospital)- better now   Patient Active Problem List  Diagnosis  . HYPERLIPIDEMIA  . OBESITY  . Anemia  . HYPERTENSION, ESSENTIAL NOS  . PHLEBITIS, SUPERFICIAL LEG VEINS  . HEMORRHOIDS  . ABDOMINAL WALL HERNIA  . PRESSURE ULCER HEEL  . OSTEOARTHRITIS  . OSTEOPOROSIS  . SLEEP DISORDER  . Other screening mammogram  . DM2 (diabetes mellitus, type 2)  . Post-menopausal  . Frequent UTI  . Low back pain  . Renal insufficiency  . Sciatica  . Sacroiliac joint dysfunction  . Constipation  . Colon cancer H/O  . Herpes zoster  . Encounter for Medicare annual wellness exam  .  Risk for falls   Past Medical History  Diagnosis Date  . Diabetes mellitus   . Hyperlipidemia   . Osteoarthritis   . Osteoporosis   . Hypertension   . Obesity   . Upper GI bleed     AV malformation/when anticoag  . Sacroiliac joint dysfunction 04/01/2012   Past Surgical History  Procedure Laterality Date  . Appendectomy    . Cholecystectomy    . Colon resection  1988    secondary to cancer  . Polypectomy  1998    benign X 2  . Cervicitis  1964    conization   . Total knee arthroplasty  07/2009    right   . Endometrial polyp  05/2000    hyperplasia-laser treatment  . Joint replacement      Rt knee replacement   History  Substance Use Topics  . Smoking status: Former Smoker    Types: Cigarettes    Quit date: 08/14/1962  . Smokeless tobacco: Never Used     Comment: Quit over 10 years ago  . Alcohol Use: No   Family History  Problem Relation Age of Onset  . Stroke Father   . Cancer Brother     lung   Allergies  Allergen Reactions  . Diclofenac Sodium Other (See Comments)    : vomiting  . Fluoxetine Hcl Other (See Comments)     vomiting  . Shellfish Allergy Swelling   Current Outpatient Prescriptions on File Prior to Visit  Medication Sig Dispense Refill  . amLODipine (NORVASC) 5 MG tablet Take 1 tablet (5 mg total) by mouth daily.  30 tablet  1  . aspirin 325 MG EC tablet Take 1 tablet (325 mg total) by mouth daily.      Marland Kitchen atorvastatin (LIPITOR) 10 MG tablet Take 1 tablet (10 mg total) by mouth daily.  30 tablet  1  . calcium carbonate (OS-CAL) 600 MG TABS Take 1 tablet (600 mg total) by mouth 2 (two) times daily with a meal.  60 tablet    . cyclobenzaprine (FLEXERIL) 5 MG tablet Take 1 tablet (5 mg total) by mouth 2 (two) times daily as needed for muscle spasms.  60 tablet  0  . gabapentin (NEURONTIN) 100 MG capsule Take 1 capsule (100 mg total) by mouth at bedtime.  30 capsule  0  . imipramine (TOFRANIL) 50 MG tablet Take 2 tablets (100 mg total) by mouth at  bedtime.  30 tablet  1  . metoprolol (LOPRESSOR) 50 MG tablet Take 1 tablet (50 mg total)  by mouth 2 (two) times daily.  60 tablet  6  . Multiple Vitamin (MULTIVITAMIN WITH MINERALS) TABS Take 1 tablet by mouth daily.      Marland Kitchen omeprazole (PRILOSEC) 20 MG capsule Take 1 capsule (20 mg total) by mouth daily.  30 capsule  1  . senna-docusate (SENOKOT-S) 8.6-50 MG per tablet Take 2 tablets by mouth 2 (two) times daily.  120 tablet  5  . valACYclovir (VALTREX) 1000 MG tablet Take 1 tablet (1,000 mg total) by mouth 3 (three) times daily.  21 tablet  0  . vitamin C (ASCORBIC ACID) 500 MG tablet Take 1 tablet (500 mg total) by mouth daily.  30 tablet  1   No current facility-administered medications on file prior to visit.    Review of Systems Review of Systems  Constitutional: Negative for fever, appetite change, fatigue and unexpected weight change.  Eyes: Negative for pain and visual disturbance.  Respiratory: Negative for cough and shortness of breath.   Cardiovascular: Negative for cp or palpitations    Gastrointestinal: Negative for nausea, diarrhea and constipation.  Genitourinary: Negative for urgency and frequency.  Skin: Negative for pallor or rash   MSK pos for back and knee pain  Neurological: Negative for weakness, light-headedness, numbness and headaches.  Hematological: Negative for adenopathy. Does not bruise/bleed easily.  Psychiatric/Behavioral: Negative for dysphoric mood. The patient is not nervous/anxious.         Objective:   Physical Exam  Constitutional: She appears well-developed and well-nourished. No distress.  HENT:  Head: Normocephalic and atraumatic.  Right Ear: External ear normal.  Left Ear: External ear normal.  Nose: Nose normal.  Mouth/Throat: Oropharynx is clear and moist.  Eyes: Conjunctivae and EOM are normal. Pupils are equal, round, and reactive to light. Right eye exhibits no discharge. Left eye exhibits no discharge. No scleral icterus.  Neck:  Normal range of motion. Neck supple. No JVD present. Carotid bruit is not present. No thyromegaly present.  Cardiovascular: Normal rate, regular rhythm, normal heart sounds and intact distal pulses.  Exam reveals no gallop.   Pulmonary/Chest: Effort normal and breath sounds normal. No respiratory distress. She has no wheezes. She has no rales.  Abdominal: Soft. Bowel sounds are normal. She exhibits no distension, no abdominal bruit and no mass. There is no tenderness.  Baseline scar  Genitourinary: No breast swelling, tenderness, discharge or bleeding.  Breast exam: No mass, nodules, thickening, tenderness, bulging, retraction, inflamation, nipple discharge noted.  No axillary or clavicular LA.  Chaperoned exam.   Resolving shingles rash present on Left breast   Musculoskeletal: Normal range of motion. She exhibits no edema and no tenderness.  Poor rom knees and LS   Lymphadenopathy:    She has no cervical adenopathy.  Neurological: She is alert. She has normal reflexes. No cranial nerve deficit. She exhibits normal muscle tone. Coordination normal.  Gait is wide bases and slow using a cane  Skin: Skin is warm and dry. Rash noted. No erythema. No pallor.  Vesicular rash - is drying up and resolving over L breast and back - few intact vesicles left , slt tender No signs of superinfection  Psychiatric: She has a normal mood and affect.          Assessment & Plan:

## 2012-07-05 NOTE — Patient Instructions (Addendum)
Eat a healthy diet and avoid sugars - if sugar increases we will start diabetes medicine If you are interested in a shingles/zoster vaccine - call your insurance to check on coverage,( you should not get it within 1 month of other vaccines) , then call us for a prescription  for it to take to a pharmacy that gives the shot , or make a nurse visit to get it here depending on your coverage- we will consider that in 6-12 months I'm glad shingles is improving  To avoid falls-I want you to use your walker instead of a cane

## 2012-07-06 MED ORDER — METOPROLOL TARTRATE 50 MG PO TABS: 50.0000 mg | ORAL_TABLET | Freq: Two times a day (BID) | ORAL | Status: DC

## 2012-07-06 MED ORDER — OMEPRAZOLE 20 MG PO CPDR: 20.0000 mg | DELAYED_RELEASE_CAPSULE | Freq: Every day | ORAL | Status: DC

## 2012-07-06 MED ORDER — ATORVASTATIN CALCIUM 10 MG PO TABS: 10.0000 mg | ORAL_TABLET | Freq: Every day | ORAL | Status: DC

## 2012-07-06 MED ORDER — AMLODIPINE BESYLATE 5 MG PO TABS: 5.0000 mg | ORAL_TABLET | Freq: Every day | ORAL | Status: DC

## 2012-07-06 MED ORDER — IMIPRAMINE HCL 50 MG PO TABS: 100.0000 mg | ORAL_TABLET | Freq: Every day | ORAL | Status: DC

## 2012-07-06 NOTE — Assessment & Plan Note (Signed)
Multifactorial- with orthopedic issues/ (use of cane) / neuropathy/ ataxia/poor balance and medications that can inc fall risk Disc this in great detail and I feel strongly that she must change from cane to walker for all ambulation at this time  She is agreeable to this Disc house- she has no rugs - which is helpful as well  Pain medication is only to be used when absolutely necessary  We can consider PT for gait/vestibular training at any time

## 2012-07-06 NOTE — Assessment & Plan Note (Signed)
Stable and chronic  Rev labs No tx px  Disc balanced diet

## 2012-07-06 NOTE — Assessment & Plan Note (Signed)
dexa is up to date showing osteopenia  Disc imp of ca and D and exercise No fragility fractures- did discuss safety issues

## 2012-07-06 NOTE — Assessment & Plan Note (Signed)
Disc goals for lipids and reasons to control them Rev labs with pt Rev low sat fat diet in detail   

## 2012-07-06 NOTE — Assessment & Plan Note (Signed)
bp in fair control at this time  No changes needed  Disc lifstyle change with low sodium diet and exercise  Labs reviewed  

## 2012-07-06 NOTE — Assessment & Plan Note (Signed)
a1c is up to 7.0- disc imp of low glycemic diet and disc that in detail  If no improvement in 3 mo can consider metformin since her renal fxn is back to baseline  Hopefully will also be more active in spring months  F/u 3 mo

## 2012-07-06 NOTE — Assessment & Plan Note (Addendum)
Markedly improved with suprisingly less pain than expected  She has finished valtrex, is already on gabapentin and has pain med for use prn if really needed  Will plan to immunize if she can afford it in 6-12 mo

## 2012-07-06 NOTE — Assessment & Plan Note (Signed)
Reviewed health habits including diet and exercise and skin cancer prevention Also reviewed health mt list, fam hx and immunizations   See HPI  Will consider zostavax once her zoster is resolved if insurance covers it

## 2012-07-10 ENCOUNTER — Other Ambulatory Visit: Payer: Self-pay | Admitting: Family Medicine

## 2012-07-11 ENCOUNTER — Telehealth: Payer: Self-pay | Admitting: Family Medicine

## 2012-07-11 NOTE — Telephone Encounter (Signed)
Patient Information:  Caller Name: Suhayla  Phone: [phone]  Patient: Ivette Loyal  Gender: Female  DOB: [birth year]  Age: 79 Years  PCP: [name] [facility])  Office Follow Up:  Does the office need to follow up with this patient?: Yes  Instructions For The Office: She is concerned about using Benadryl due to stomach Ulcer.  Pharmacy is [facility] [phone].  Caller would like something for severe itching. Pain medication is somewhat managing her pain. She does not want to take too much due to constipation  RN Note:  She is concerned about using Benadryl due to stomach Ulcer.  Pharmacy is [facility] [phone].  Caller would like something for severe itching. Pain medication is somewhat managing her pain. She does not want to take too much due to constipation  Symptoms  Reason For Call & Symptoms: Patient states she has the Shingles. They are located at Left breast radiating around to her back. She was seen last week and given pain medication but she wants something for the itching.Marland Kitchen  She was given Valtrex and completed. She is taking [place] History In EMR: Yes  Reviewed Medications In EMR: Yes  Reviewed Allergies In EMR: Yes  Reviewed Surgeries / Procedures: Yes  Date of Onset of Symptoms: 06/28/2012  Treatments Tried: Norco  Treatments Tried Worked: No  Guideline(s) Used:  Skin Lesion - Moles or Growths  Itching - Widespread  Itching - Localized  Disposition Per Guideline:   Home Care  Reason For Disposition Reached:   Mild localized itching  Advice Given:  Timmothy Sours  Try not to scratch.  Itching is often worsened by scratching (the "Itch-Scratch" cycle).  Cut the fingernails short. (Reason: prevent secondary bacterial infection.)  Wash Off Irritants:  Wash the area once with soap to remove any remaining irritants. Rinse off the soap.  Then avoid using soap on this area. Sometimes soap can dry out the skin and make the skin more itchy.  Ice Cube:  For flare-ups of itching, rub the area with a small ice cube for 5 minutes.  Call Back If:  Looks infected (e.g., spreading redness, red streak, pus)  Itching becomes severe  Itching lasts over 7 days  You become worse.  RN Overrode Recommendation:  Patient Requests Prescription  She is concerned about using Benadryl due to stomach Ulcer.  Pharmacy is [place] [phone].  Caller would like something for severe itching. Pain medication is somewhat managing her pain. She does not want to take too much due to constipation

## 2012-07-11 NOTE — Telephone Encounter (Signed)
Pt.notified

## 2012-07-11 NOTE — Telephone Encounter (Signed)
I do not think benadryl would aggravate stomach ulcer - go ahead and take that for itching and let me know if any problems

## 2012-07-18 ENCOUNTER — Telehealth: Payer: Self-pay | Admitting: Family Medicine

## 2012-07-18 MED ORDER — GABAPENTIN 100 MG PO CAPS: 200.0000 mg | ORAL_CAPSULE | Freq: Two times a day (BID) | ORAL | Status: DC

## 2012-07-18 NOTE — Telephone Encounter (Signed)
Have her increase her gabapentin - she currently takes 100 mg at bedtime Have her increase it to 200 mg am and 200 mg bedtime and see if this helps- we can increase this gradually but watch out for sedation

## 2012-07-18 NOTE — Telephone Encounter (Signed)
Patient Information:  Caller Name: Devonta  Phone: [phone]  Patient: Erin [name]  Gender: Female  DOB: February 19, 1929  Age: 79 Years  PCP: [name] [facility])  Office Follow Up:  Does the office need to follow up with this patient?: Yes  Instructions For The Office: Patient would like to know if MD can suggest something else for pain from shingles. Has been taking Hydrocodone 5/325 every 6hours but pain returns before medicine is due. PLEASE CALL PATIENT AND ADVISE IF TIME FRAME TAKING MEDICINE CAN BE ALTERED OR MUST BE SEEN AGAIN  RN Note:  Shingles have been crusted over for 1 week but continue to itch. Has been taking Benadryl 25mg  every 4hrs when needed for itching but has not taken 4-8. Wants something else for pain.  Symptoms  Reason For Call & Symptoms: Was diagnosed with shingles 3-26 and prescribed Hydrocodone for pain. Is not helping and pain medicine does not last for 6 hours which is timeframe medicine can be taken.  Reviewed Health History In EMR: Yes  Reviewed Medications In EMR: Yes  Reviewed Allergies In EMR: Yes  Reviewed Surgeries / Procedures: Yes  Date of Onset of Symptoms: 07/05/2012  Treatments Tried: Hydrocodone 5/325 Benadryl  Treatments Tried Worked: No  Guideline(s) Used:  Rash or Redness - Localized  Disposition Per Guideline:   See Today or Tomorrow in Office  Reason For Disposition Reached:   Severe local itching persists after 2 days of steroid cream and antihistamines  Advice Given:  N/A  Patient Will Follow Care Advice:  YES

## 2012-07-18 NOTE — Telephone Encounter (Signed)
Pt notified to increase med and pt verbalized understanding, Rx sent to pharmacy with new directions (ok per Dr. Glori Bickers)

## 2012-07-19 ENCOUNTER — Telehealth: Payer: Self-pay

## 2012-07-19 MED ORDER — GABAPENTIN 100 MG PO CAPS: 200.0000 mg | ORAL_CAPSULE | Freq: Two times a day (BID) | ORAL | Status: DC

## 2012-07-19 NOTE — Telephone Encounter (Signed)
[facility] wanted to verify quantity of Gabapentin; #30 was sent; Amy with [facility] wanted to know if quantity could be # 120 which would cover 30 days.Please advise.

## 2012-07-19 NOTE — Telephone Encounter (Signed)
Called [facility] and changed it #120, also changed it on med list

## 2012-07-19 NOTE — Telephone Encounter (Signed)
Please change that to 120-thanks

## 2012-07-24 ENCOUNTER — Other Ambulatory Visit: Payer: Self-pay | Admitting: Family Medicine

## 2012-07-24 NOTE — Telephone Encounter (Signed)
Ok to refill 

## 2012-07-24 NOTE — Telephone Encounter (Signed)
Before I refil this (and we want to minimize as much as possible)- please update me on how her pain is Erin [name] many of these she needs average in a day and whether the gabapentin is helping ? thanks

## 2012-07-25 ENCOUNTER — Other Ambulatory Visit: Payer: Self-pay | Admitting: Family Medicine

## 2012-07-25 NOTE — Telephone Encounter (Signed)
Left voicemail requesting pt to call office 

## 2012-07-25 NOTE — Telephone Encounter (Signed)
Pt left v/m returning call and request cb [phone].

## 2012-07-26 NOTE — Telephone Encounter (Signed)
Rx called in as prescribed 

## 2012-07-26 NOTE — Telephone Encounter (Signed)
Pt said her hydrocodone and gabapentin was not at [facility]; spoke with Pilar Plate at [place] and pts niece picked up gabapentin and Hydrocodone is now ready for pickup. Pt notified while on phone and voiced understanding.

## 2012-07-26 NOTE — Telephone Encounter (Signed)
She is aware- but I worry about constipation with that so use caution  Px written for call in

## 2012-07-26 NOTE — Telephone Encounter (Signed)
Pt said her pain was a lot better but over the last few days its starting to get worse, pt said the worse pain comes from her scratching it makes it even more painful but she is trying not to scratch the gabapentin is helping a little but she is still having pain, pt said on an average she takes about 3 tabs of the norco a day

## 2012-08-15 ENCOUNTER — Other Ambulatory Visit: Payer: Self-pay | Admitting: Family Medicine

## 2012-08-15 NOTE — Telephone Encounter (Signed)
Rx called in as prescribed 

## 2012-08-15 NOTE — Telephone Encounter (Signed)
Px written for call in   

## 2012-08-15 NOTE — Telephone Encounter (Signed)
Ok to refill 

## 2012-09-01 ENCOUNTER — Other Ambulatory Visit: Payer: Self-pay

## 2012-09-01 MED ORDER — METOPROLOL TARTRATE 50 MG PO TABS: 100.0000 mg | ORAL_TABLET | Freq: Two times a day (BID) | ORAL | Status: DC

## 2012-09-01 NOTE — Telephone Encounter (Signed)
Pt said when seen 06/2012 was told to increase metoprolol 50 mg to 2 tabs twice a day. Pt request new rx sent to [facility].Please advise. Pt med list has metoprolol 50 mg one tab twice a day. Please advise.

## 2012-09-01 NOTE — Telephone Encounter (Signed)
Patient notified that script has been sent to pharmacy

## 2012-09-01 NOTE — Telephone Encounter (Signed)
Done and sent to [facility]

## 2012-09-08 ENCOUNTER — Other Ambulatory Visit: Payer: Self-pay | Admitting: Family Medicine

## 2012-09-08 NOTE — Telephone Encounter (Signed)
Okay #60 x 0 

## 2012-09-08 NOTE — Telephone Encounter (Signed)
Electronic refill request, Dr. Tower out of office, please advise  

## 2012-09-08 NOTE — Telephone Encounter (Signed)
Done, Rx called in as prescribed

## 2012-09-18 ENCOUNTER — Other Ambulatory Visit: Payer: Self-pay | Admitting: Family Medicine

## 2012-09-19 NOTE — Telephone Encounter (Signed)
Called [facility] since I just called this Rx in on 09/08/12, they said that the Rx called in on 09/08/12 was only a 15 day supply so this is a new refill request, please advise

## 2012-09-19 NOTE — Telephone Encounter (Signed)
Left voicemail requesting pt to call office 

## 2012-09-19 NOTE — Telephone Encounter (Signed)
Please ask on average how many pills she needs per day and which pain she uses them for-thanks

## 2012-09-26 NOTE — Telephone Encounter (Signed)
Px written for call in   Try to use only when necessary since this is a habit forming med

## 2012-09-26 NOTE — Telephone Encounter (Signed)
Rx called in as prescribed 

## 2012-09-26 NOTE — Telephone Encounter (Signed)
Pt said she is still taking the pain med every 6 hours (4 tabs a day) and she is taking it for the shingles pain, she is still having a lot of pain and itching due to shingles

## 2012-10-06 ENCOUNTER — Encounter: Payer: Self-pay | Admitting: Radiology

## 2012-10-09 ENCOUNTER — Ambulatory Visit: Payer: Medicare PPO | Admitting: Family Medicine

## 2012-10-10 ENCOUNTER — Other Ambulatory Visit: Payer: Self-pay | Admitting: Family Medicine

## 2012-10-12 ENCOUNTER — Encounter: Payer: Self-pay | Admitting: *Deleted

## 2012-10-16 ENCOUNTER — Ambulatory Visit (INDEPENDENT_AMBULATORY_CARE_PROVIDER_SITE_OTHER): Payer: Medicare PPO | Admitting: Family Medicine

## 2012-10-16 ENCOUNTER — Encounter: Payer: Self-pay | Admitting: Family Medicine

## 2012-10-16 VITALS — BP 130/68 | HR 64 | Temp 99.4°F | Ht 70.5 in | Wt 206.8 lb

## 2012-10-16 DIAGNOSIS — B0229 Other postherpetic nervous system involvement: Secondary | ICD-10-CM | POA: Insufficient documentation

## 2012-10-16 DIAGNOSIS — E119 Type 2 diabetes mellitus without complications: Secondary | ICD-10-CM

## 2012-10-16 DIAGNOSIS — I10 Essential (primary) hypertension: Secondary | ICD-10-CM

## 2012-10-16 MED ORDER — GABAPENTIN 100 MG PO CAPS: 300.0000 mg | ORAL_CAPSULE | Freq: Three times a day (TID) | ORAL | Status: DC

## 2012-10-16 NOTE — Assessment & Plan Note (Signed)
a1c today Per home sugars- good control  If bun/ cr are normal  Metformin would be an option Rev low glycemic diet in detail and enc more activity

## 2012-10-16 NOTE — Assessment & Plan Note (Signed)
This persists Goal is to get off the norco Inc gabapentin to 300 mg tid as tol and inc if able  Will try to wean off norco Also disc poss of tricyclic if necessary Will need zoster vaccine when better

## 2012-10-16 NOTE — Patient Instructions (Signed)
Increase your gabapentin to 300 mg (3 pills of the 100 mg) three times per day - am/ mid day and bedtime  If side effects let me know As pain decreases start to wean back on the norco Make sure to get your annual eye exam  Labs for A1c today  Follow up with me in 6-8 weeks (earlier if needed) for shingles pain

## 2012-10-16 NOTE — Progress Notes (Signed)
Subjective:    Patient ID: Erin [name], female    DOB: 12/19/1928, 79 y.o.   MRN: [id number]  HPI Here for f/u of DM and other chronic health problems   bp is stable today  No cp or palpitations or headaches or edema  No side effects to medicines  BP Readings from Last 3 Encounters:  10/16/12 130/68  07/05/12 116/58  06/28/12 130/64     Wt is up 8 lb with bmi of 29 Feet are a little swollen from sitting  For exercise - she tries to walk 3 times per day in the house   Needs to address controlled subst agreement  She takes meds for post herpetic neuralgia  norco- one pill every 6 hours during the day (3 pills in a day) - is not making her sleepy or dizzy and is not constipating her  She is drinking prune juice and occ senna  Also on gabapentin 200 bid  A friend told her to ask about amitriptyline as an option as well  She also walks with a walker  Still has a lot of pain from shingles - very sensitive - clothing rubs on her skin    Diabetes Home sugar results - has been fasting - under 120 usually  PP- ranges from 120s- 140s  DM diet - fair Exercise not much Symptoms--pt states she drinks a lot of fluids / does stay thirsty  A1C last  Lab Results  Component Value Date   HGBA1C 7.0* 06/28/2012    No problems with medications -is diet controlling sugar  Lab Results  Component Value Date   CREATININE 1.1 06/28/2012   may be able to re start metformin  Renal protection-not on ace/ hx of shellfish allergy  Last eye exam -not within a year     Patient Active Problem List   Diagnosis Date Noted  . Encounter for Medicare annual wellness exam 07/05/2012  . Risk for falls 07/05/2012  . Herpes zoster 06/28/2012  . Colon cancer H/O 04/07/2012  . Sacroiliac joint dysfunction 04/01/2012  . Sciatica 03/31/2012  . Low back pain 04/02/2011  . Frequent UTI 11/30/2010  . Other screening mammogram 09/30/2010  . DM2 (diabetes mellitus, type 2) 09/30/2010  . Post-menopausal  09/30/2010  . ABDOMINAL WALL HERNIA 02/26/2010  . Anemia 08/18/2009  . OBESITY 10/31/2007  . HYPERTENSION, ESSENTIAL NOS 10/31/2007  . HYPERLIPIDEMIA 07/26/2006  . PHLEBITIS, SUPERFICIAL LEG VEINS 07/26/2006  . HEMORRHOIDS 07/26/2006  . OSTEOARTHRITIS 07/26/2006  . Osteopenia 07/26/2006  . SLEEP DISORDER 07/26/2006   Past Medical History  Diagnosis Date  . Diabetes mellitus   . Hyperlipidemia   . Osteoarthritis   . Osteoporosis   . Hypertension   . Obesity   . Upper GI bleed     AV malformation/when anticoag  . Sacroiliac joint dysfunction 04/01/2012   Past Surgical History  Procedure Laterality Date  . Appendectomy    . Cholecystectomy    . Colon resection  1988    secondary to cancer  . Polypectomy  1998    benign X 2  . Cervicitis  1964    conization   . Total knee arthroplasty  07/2009    right   . Endometrial polyp  05/2000    hyperplasia-laser treatment  . Joint replacement      Rt knee replacement   History  Substance Use Topics  . Smoking status: Former Smoker    Types: Cigarettes    Quit date: 08/14/1962  . Smokeless  tobacco: Never Used     Comment: Quit over 10 years ago  . Alcohol Use: No   Family History  Problem Relation Age of Onset  . Stroke Father   . Cancer Brother     lung   Allergies  Allergen Reactions  . Diclofenac Sodium Other (See Comments)    : vomiting  . Fluoxetine Hcl Other (See Comments)     vomiting  . Shellfish Allergy Swelling   Current Outpatient Prescriptions on File Prior to Visit  Medication Sig Dispense Refill  . amLODipine (NORVASC) 5 MG tablet Take 1 tablet (5 mg total) by mouth daily.  30 tablet  11  . aspirin 325 MG EC tablet Take 1 tablet (325 mg total) by mouth daily.      Marland Kitchen atorvastatin (LIPITOR) 10 MG tablet Take 1 tablet (10 mg total) by mouth daily.  30 tablet  11  . calcium carbonate (OS-CAL) 600 MG TABS Take 1 tablet (600 mg total) by mouth 2 (two) times daily with a meal.  60 tablet    .  cyclobenzaprine (FLEXERIL) 5 MG tablet Take 1 tablet (5 mg total) by mouth 2 (two) times daily as needed for muscle spasms.  60 tablet  0  . gabapentin (NEURONTIN) 100 MG capsule Take 2 capsules (200 mg total) by mouth 2 (two) times daily.  120 capsule  1  . glucose blood (FREESTYLE LITE) test strip CHECK BLOOD SUGAR ONCE A DAY AS NEEDED FOR DM 250.00  100 each  1  . HYDROcodone-acetaminophen (NORCO/VICODIN) 5-325 MG per tablet TAKE ONE TABLET BY MOUTH EVERY 6 HOURS AS NEEDED FOR PAIN  120 tablet  0  . imipramine (TOFRANIL) 50 MG tablet Take 2 tablets (100 mg total) by mouth at bedtime.  60 tablet  11  . Lancets (FREESTYLE) lancets CHECK BLOOD SUGAR ONCE DAILY AS DIRECTEDAND AS NEEDED  100 each  5  . metoprolol (LOPRESSOR) 50 MG tablet Take 2 tablets (100 mg total) by mouth 2 (two) times daily.  120 tablet  11  . Multiple Vitamin (MULTIVITAMIN WITH MINERALS) TABS Take 1 tablet by mouth daily.      Marland Kitchen omeprazole (PRILOSEC) 20 MG capsule Take 1 capsule (20 mg total) by mouth daily.  30 capsule  11  . senna-docusate (SENOKOT-S) 8.6-50 MG per tablet Take 2 tablets by mouth 2 (two) times daily.  120 tablet  5  . valACYclovir (VALTREX) 1000 MG tablet Take 1 tablet (1,000 mg total) by mouth 3 (three) times daily.  21 tablet  0  . vitamin C (ASCORBIC ACID) 500 MG tablet Take 1 tablet (500 mg total) by mouth daily.  30 tablet  1   No current facility-administered medications on file prior to visit.     Review of Systems Review of Systems  Constitutional: Negative for fever, appetite change, fatigue and unexpected weight change.  Eyes: Negative for pain and visual disturbance.  Respiratory: Negative for cough and shortness of breath.   Cardiovascular: Negative for cp or palpitations    Gastrointestinal: Negative for nausea, diarrhea and constipation.  Genitourinary: Negative for urgency and frequency.  Skin: Negative for pallor or rash  pos for pain from shingles -persistent  Neurological: Negative for  weakness, light-headedness, numbness and headaches.  Hematological: Negative for adenopathy. Does not bruise/bleed easily.  Psychiatric/Behavioral: Negative for dysphoric mood. The patient is not nervous/anxious.         Objective:   Physical Exam  Constitutional: She appears well-developed and well-nourished. No distress.  obese and well appearing   HENT:  Head: Normocephalic and atraumatic.  Mouth/Throat: Oropharynx is clear and moist.  Eyes: Conjunctivae and EOM are normal. Pupils are equal, round, and reactive to light. Right eye exhibits no discharge. Left eye exhibits no discharge. No scleral icterus.  Neck: Normal range of motion. Neck supple. No JVD present. Carotid bruit is not present. No thyromegaly present.  Cardiovascular: Normal rate, regular rhythm and intact distal pulses.   Pulmonary/Chest: Effort normal and breath sounds normal. No respiratory distress. She has no wheezes. She has no rales.  No crackles   Abdominal: Soft. Bowel sounds are normal. She exhibits no distension, no abdominal bruit and no mass. There is no tenderness.  Musculoskeletal: She exhibits no edema and no tenderness.  Lymphadenopathy:    She has no cervical adenopathy.  Neurological: She is alert. She has normal reflexes. No cranial nerve deficit. She exhibits normal muscle tone. Coordination normal.  Skin: Skin is warm and dry. No rash noted. No erythema. No pallor.  Shingles rash is resolved but hyperpigmentation  Persists over L torso /chest/back and is still sensitive  Psychiatric: She has a normal mood and affect.          Assessment & Plan:

## 2012-10-16 NOTE — Assessment & Plan Note (Signed)
bp in fair control at this time  No changes needed  Disc lifstyle change with low sodium diet and exercise   

## 2012-10-20 ENCOUNTER — Other Ambulatory Visit: Payer: Self-pay | Admitting: *Deleted

## 2012-10-20 MED ORDER — METFORMIN HCL 500 MG PO TABS: 500.0000 mg | ORAL_TABLET | Freq: Two times a day (BID) | ORAL | Status: DC

## 2012-10-27 ENCOUNTER — Encounter: Payer: Self-pay | Admitting: Family Medicine

## 2012-11-24 ENCOUNTER — Other Ambulatory Visit: Payer: Self-pay | Admitting: Family Medicine

## 2012-11-24 NOTE — Telephone Encounter (Signed)
Electronic refill request please advise  

## 2012-11-24 NOTE — Telephone Encounter (Signed)
Rx called in as prescribed 

## 2012-11-24 NOTE — Telephone Encounter (Signed)
Px written for call in   

## 2012-12-08 ENCOUNTER — Encounter: Payer: Self-pay | Admitting: Family Medicine

## 2012-12-08 ENCOUNTER — Ambulatory Visit (INDEPENDENT_AMBULATORY_CARE_PROVIDER_SITE_OTHER): Payer: Medicare PPO | Admitting: Family Medicine

## 2012-12-08 VITALS — BP 130/68 | HR 63 | Temp 98.5°F | Ht 70.5 in | Wt 226.0 lb

## 2012-12-08 DIAGNOSIS — B0229 Other postherpetic nervous system involvement: Secondary | ICD-10-CM

## 2012-12-08 DIAGNOSIS — Z23 Encounter for immunization: Secondary | ICD-10-CM

## 2012-12-08 MED ORDER — GABAPENTIN 300 MG PO CAPS: ORAL_CAPSULE | ORAL | Status: DC

## 2012-12-08 NOTE — Progress Notes (Signed)
Subjective:    Patient ID: Erin [name], female    DOB: [birth year], 79 y.o.   MRN: [id number]  HPI Here for f/u of post herpetic neuralgia   Is doing a little better - has been able to get out a bit  Went to church  Still very uncomfortable at the area where the rash was  Itching and pain  A little better  She tries to keep clothing from touching the area  Last visit we went up on gabapentin to 300 mg tid  No side effects however   norco - tends to still take 1 pill every 6 hours   Blood sugars are pretty good  Overall 100-120 Lab Results  Component Value Date   HGBA1C 7.0* 10/16/2012   checks it mainly in the am - occ later   Patient Active Problem List   Diagnosis Date Noted  . Post herpetic neuralgia 10/16/2012  . Encounter for Medicare annual wellness exam 07/05/2012  . Risk for falls 07/05/2012  . Herpes zoster 06/28/2012  . Colon cancer H/O 04/07/2012  . Sacroiliac joint dysfunction 04/01/2012  . Sciatica 03/31/2012  . Low back pain 04/02/2011  . Frequent UTI 11/30/2010  . Other screening mammogram 09/30/2010  . DM2 (diabetes mellitus, type 2) 09/30/2010  . Post-menopausal 09/30/2010  . ABDOMINAL WALL HERNIA 02/26/2010  . Anemia 08/18/2009  . OBESITY 10/31/2007  . HYPERTENSION, ESSENTIAL NOS 10/31/2007  . HYPERLIPIDEMIA 07/26/2006  . PHLEBITIS, SUPERFICIAL LEG VEINS 07/26/2006  . HEMORRHOIDS 07/26/2006  . OSTEOARTHRITIS 07/26/2006  . Osteopenia 07/26/2006  . SLEEP DISORDER 07/26/2006   Past Medical History  Diagnosis Date  . Diabetes mellitus   . Hyperlipidemia   . Osteoarthritis   . Osteoporosis   . Hypertension   . Obesity   . Upper GI bleed     AV malformation/when anticoag  . Sacroiliac joint dysfunction 04/01/2012   Past Surgical History  Procedure Laterality Date  . Appendectomy    . Cholecystectomy    . Colon resection  1988    secondary to cancer  . Polypectomy  1998    benign X 2  . Cervicitis  1964    conization   . Total knee  arthroplasty  07/2009    right   . Endometrial polyp  05/2000    hyperplasia-laser treatment  . Joint replacement      Rt knee replacement   History  Substance Use Topics  . Smoking status: Former Smoker    Types: Cigarettes    Quit date: 08/14/1962  . Smokeless tobacco: Never Used     Comment: Quit over 10 years ago  . Alcohol Use: No   Family History  Problem Relation Age of Onset  . Stroke Father   . Cancer Brother     lung   Allergies  Allergen Reactions  . Diclofenac Sodium Other (See Comments)    : vomiting  . Fluoxetine Hcl Other (See Comments)     vomiting  . Shellfish Allergy Swelling   Current Outpatient Prescriptions on File Prior to Visit  Medication Sig Dispense Refill  . amLODipine (NORVASC) 5 MG tablet Take 1 tablet (5 mg total) by mouth daily.  30 tablet  11  . aspirin 325 MG EC tablet Take 1 tablet (325 mg total) by mouth daily.      Marland Kitchen atorvastatin (LIPITOR) 10 MG tablet Take 1 tablet (10 mg total) by mouth daily.  30 tablet  11  . calcium carbonate (OS-CAL) 600 MG TABS Take  1 tablet (600 mg total) by mouth 2 (two) times daily with a meal.  60 tablet    . cyclobenzaprine (FLEXERIL) 5 MG tablet Take 1 tablet (5 mg total) by mouth 2 (two) times daily as needed for muscle spasms.  60 tablet  0  . gabapentin (NEURONTIN) 100 MG capsule Take 3 capsules (300 mg total) by mouth 3 (three) times daily.  270 capsule  3  . glucose blood (FREESTYLE LITE) test strip CHECK BLOOD SUGAR ONCE A DAY AS NEEDED FOR DM 250.00  100 each  1  . HYDROcodone-acetaminophen (NORCO/VICODIN) 5-325 MG per tablet TAKE 1 TABLET BY MOUTH EVERY 6 HOURS AS NEEDED FOR PAIN.  120 tablet  0  . imipramine (TOFRANIL) 50 MG tablet Take 2 tablets (100 mg total) by mouth at bedtime.  60 tablet  11  . Lancets (FREESTYLE) lancets CHECK BLOOD SUGAR ONCE DAILY AS DIRECTEDAND AS NEEDED  100 each  5  . metFORMIN (GLUCOPHAGE) 500 MG tablet Take 1 tablet (500 mg total) by mouth 2 (two) times daily with a meal.   60 tablet  5  . metoprolol (LOPRESSOR) 50 MG tablet Take 2 tablets (100 mg total) by mouth 2 (two) times daily.  120 tablet  11  . Multiple Vitamin (MULTIVITAMIN WITH MINERALS) TABS Take 1 tablet by mouth daily.      Marland Kitchen omeprazole (PRILOSEC) 20 MG capsule Take 1 capsule (20 mg total) by mouth daily.  30 capsule  11  . senna-docusate (SENOKOT-S) 8.6-50 MG per tablet Take 2 tablets by mouth 2 (two) times daily.  120 tablet  5  . vitamin C (ASCORBIC ACID) 500 MG tablet Take 1 tablet (500 mg total) by mouth daily.  30 tablet  1   No current facility-administered medications on file prior to visit.     Review of Systems Review of Systems  Constitutional: Negative for fever, appetite change, fatigue and unexpected weight change.  Eyes: Negative for pain and visual disturbance.  Respiratory: Negative for cough and shortness of breath.   Cardiovascular: Negative for cp or palpitations    Gastrointestinal: Negative for nausea, diarrhea and constipation.  Genitourinary: Negative for urgency and frequency.  Skin: Negative for pallor or rash  pos for pain in area of shingles/ and also scarring there  Neurological: Negative for weakness, light-headedness, numbness and headaches.  Hematological: Negative for adenopathy. Does not bruise/bleed easily.  Psychiatric/Behavioral: Negative for dysphoric mood. The patient is not nervous/anxious.  (mood is good and so is attitude)       Objective:   Physical Exam  Constitutional: She appears well-developed and well-nourished. No distress.  obese and well appearing   HENT:  Head: Normocephalic and atraumatic.  Eyes: Conjunctivae and EOM are normal. Pupils are equal, round, and reactive to light.  Neck: Normal range of motion. Neck supple.  Cardiovascular: Normal rate and regular rhythm.   Pulmonary/Chest: Effort normal and breath sounds normal.  Lymphadenopathy:    She has no cervical adenopathy.  Neurological: She is alert.  Skin: Skin is warm and dry.  No erythema. No pallor.  Area of prior shingles is hyperpigmented and very sensitive to touch (L torso and breast) No open areas  Pt holds clothing away from it due to sensitivity   Psychiatric: She has a normal mood and affect.  pleasant          Assessment & Plan:

## 2012-12-08 NOTE — Patient Instructions (Addendum)
For the post herpetic neuralgia (shingles pain)- you can use 300 to 600 mg three times a day - if the 600 works better that is fine - just watch out for sedation and dizziness as you get used to it  I sent new px to the pharmacy for 300 mg pill  Let me know if this does does not help and I'm glad you are improving  Gradually scale back on the hydrocodone as you improve  See you in October  Flu shot today

## 2012-12-10 NOTE — Assessment & Plan Note (Signed)
Pt is still quite symptomatic / though gradually imp On 300 of gabapentin - cannot afford lyrica and already on a tricyclic Will inc to 0000000 mg as tolerated  Disc poss side eff incl sedation and falls Will update

## 2013-01-14 ENCOUNTER — Telehealth: Payer: Self-pay | Admitting: Family Medicine

## 2013-01-14 DIAGNOSIS — I10 Essential (primary) hypertension: Secondary | ICD-10-CM

## 2013-01-14 DIAGNOSIS — E119 Type 2 diabetes mellitus without complications: Secondary | ICD-10-CM

## 2013-01-14 NOTE — Telephone Encounter (Signed)
Message copied by Abner Greenspan on Sun Jan 14, 2013  5:08 PM ------      Message from: Ellamae Sia      Created: Fri Jan 05, 2013  4:19 PM      Regarding: Lab orders for, Monday, 10.6.14       Labs for a 3 month f/u ------

## 2013-01-15 ENCOUNTER — Other Ambulatory Visit (INDEPENDENT_AMBULATORY_CARE_PROVIDER_SITE_OTHER): Payer: Medicare PPO

## 2013-01-15 DIAGNOSIS — E119 Type 2 diabetes mellitus without complications: Secondary | ICD-10-CM

## 2013-01-15 DIAGNOSIS — I10 Essential (primary) hypertension: Secondary | ICD-10-CM

## 2013-01-15 LAB — MICROALBUMIN / CREATININE URINE RATIO
Creatinine,U: 144.8 mg/dL
Microalb Creat Ratio: 2.5 mg/g (ref 0.0–30.0)

## 2013-01-15 LAB — COMPREHENSIVE METABOLIC PANEL
ALT: 13 U/L (ref 0–35)
Albumin: 3.9 g/dL (ref 3.5–5.2)
CO2: 22 mEq/L (ref 19–32)
Calcium: 9.6 mg/dL (ref 8.4–10.5)
Chloride: 102 mEq/L (ref 96–112)
GFR: 59.21 mL/min — ABNORMAL LOW (ref >60.00)
Glucose, Bld: 119 mg/dL — ABNORMAL HIGH (ref 70–99)
Potassium: 4 mEq/L (ref 3.5–5.1)
Sodium: 135 mEq/L (ref 135–145)
Total Bilirubin: 0.2 mg/dL — ABNORMAL LOW (ref 0.3–1.2)
Total Protein: 8 g/dL (ref 6.0–8.3)

## 2013-01-15 LAB — HEMOGLOBIN A1C: Hgb A1c MFr Bld: 6.7 % — ABNORMAL HIGH (ref 4.6–6.5)

## 2013-01-22 ENCOUNTER — Ambulatory Visit (INDEPENDENT_AMBULATORY_CARE_PROVIDER_SITE_OTHER): Payer: Medicare PPO | Admitting: Family Medicine

## 2013-01-22 ENCOUNTER — Encounter: Payer: Self-pay | Admitting: Family Medicine

## 2013-01-22 VITALS — BP 116/60 | HR 67 | Temp 98.5°F | Ht 70.5 in | Wt 226.8 lb

## 2013-01-22 DIAGNOSIS — E119 Type 2 diabetes mellitus without complications: Secondary | ICD-10-CM

## 2013-01-22 DIAGNOSIS — I10 Essential (primary) hypertension: Secondary | ICD-10-CM

## 2013-01-22 DIAGNOSIS — B0229 Other postherpetic nervous system involvement: Secondary | ICD-10-CM

## 2013-01-22 MED ORDER — GABAPENTIN 300 MG PO CAPS: ORAL_CAPSULE | ORAL | Status: DC

## 2013-01-22 NOTE — Assessment & Plan Note (Signed)
Lab Results  Component Value Date   HGBA1C 6.7* 01/15/2013    Improved  Rev diet and will try to increase exercise  Pt will schedule her own opthy exam  F/u 6 mo

## 2013-01-22 NOTE — Assessment & Plan Note (Signed)
bp in fair control at this time  No changes needed  Disc lifstyle change with low sodium diet and exercise  Labs reviewed  

## 2013-01-22 NOTE — Progress Notes (Signed)
Subjective:    Patient ID: Erin [name], female    DOB: [birth year], 79 y.o.   MRN: [id number]  HPI Here for f/u of chronic medical problems   Wt is stable with bmi of 32  Post herpetic neuralgia- she adj her dose to 600 mg - better  Was able to come off her narcotic  Overall improved   Diabetes Home sugar results  DM diet - overall doing better  Exercise - a little bit of walking with her walker  Symptoms- none  A1C last  Lab Results  Component Value Date   HGBA1C 6.7* 01/15/2013  down  From 7   No problems with medications  microalb elevated but ratio is ok Cannot have ace or arb due to anaphylaxis hx (with shellfish) Last eye exam ? Last one     bp is stable today  No cp or palpitations or headaches or edema  No side effects to medicines  BP Readings from Last 3 Encounters:  01/22/13 116/60  12/08/12 130/68  10/16/12 130/68      Patient Active Problem List   Diagnosis Date Noted  . Post herpetic neuralgia 10/16/2012  . Encounter for Medicare annual wellness exam 07/05/2012  . Risk for falls 07/05/2012  . Herpes zoster 06/28/2012  . Colon cancer H/O 04/07/2012  . Sacroiliac joint dysfunction 04/01/2012  . Sciatica 03/31/2012  . Low back pain 04/02/2011  . Frequent UTI 11/30/2010  . Other screening mammogram 09/30/2010  . DM2 (diabetes mellitus, type 2) 09/30/2010  . Post-menopausal 09/30/2010  . ABDOMINAL WALL HERNIA 02/26/2010  . Anemia 08/18/2009  . OBESITY 10/31/2007  . HYPERTENSION, ESSENTIAL NOS 10/31/2007  . HYPERLIPIDEMIA 07/26/2006  . PHLEBITIS, SUPERFICIAL LEG VEINS 07/26/2006  . HEMORRHOIDS 07/26/2006  . OSTEOARTHRITIS 07/26/2006  . Osteopenia 07/26/2006  . SLEEP DISORDER 07/26/2006   Past Medical History  Diagnosis Date  . Diabetes mellitus   . Hyperlipidemia   . Osteoarthritis   . Osteoporosis   . Hypertension   . Obesity   . Upper GI bleed     AV malformation/when anticoag  . Sacroiliac joint dysfunction 04/01/2012   Past  Surgical History  Procedure Laterality Date  . Appendectomy    . Cholecystectomy    . Colon resection  1988    secondary to cancer  . Polypectomy  1998    benign X 2  . Cervicitis  1964    conization   . Total knee arthroplasty  07/2009    right   . Endometrial polyp  05/2000    hyperplasia-laser treatment  . Joint replacement      Rt knee replacement   History  Substance Use Topics  . Smoking status: Former Smoker    Types: Cigarettes    Quit date: 08/14/1962  . Smokeless tobacco: Never Used     Comment: Quit over 10 years ago  . Alcohol Use: No   Family History  Problem Relation Age of Onset  . Stroke Father   . Cancer Brother     lung   Allergies  Allergen Reactions  . Diclofenac Sodium Other (See Comments)    : vomiting  . Fluoxetine Hcl Other (See Comments)     vomiting  . Shellfish Allergy Swelling   Current Outpatient Prescriptions on File Prior to Visit  Medication Sig Dispense Refill  . amLODipine (NORVASC) 5 MG tablet Take 1 tablet (5 mg total) by mouth daily.  30 tablet  11  . aspirin 325 MG EC tablet Take 1  tablet (325 mg total) by mouth daily.      Marland Kitchen atorvastatin (LIPITOR) 10 MG tablet Take 1 tablet (10 mg total) by mouth daily.  30 tablet  11  . calcium carbonate (OS-CAL) 600 MG TABS Take 1 tablet (600 mg total) by mouth 2 (two) times daily with a meal.  60 tablet    . gabapentin (NEURONTIN) 300 MG capsule Take 1-2 pills by mouth three times per day  180 capsule  5  . glucose blood (FREESTYLE LITE) test strip CHECK BLOOD SUGAR ONCE A DAY AS NEEDED FOR DM 250.00  100 each  1  . imipramine (TOFRANIL) 50 MG tablet Take 2 tablets (100 mg total) by mouth at bedtime.  60 tablet  11  . Lancets (FREESTYLE) lancets CHECK BLOOD SUGAR ONCE DAILY AS DIRECTEDAND AS NEEDED  100 each  5  . metFORMIN (GLUCOPHAGE) 500 MG tablet Take 1 tablet (500 mg total) by mouth 2 (two) times daily with a meal.  60 tablet  5  . metoprolol (LOPRESSOR) 50 MG tablet Take 2 tablets (100  mg total) by mouth 2 (two) times daily.  120 tablet  11  . Multiple Vitamin (MULTIVITAMIN WITH MINERALS) TABS Take 1 tablet by mouth daily.      Marland Kitchen omeprazole (PRILOSEC) 20 MG capsule Take 1 capsule (20 mg total) by mouth daily.  30 capsule  11  . senna-docusate (SENOKOT-S) 8.6-50 MG per tablet Take 2 tablets by mouth 2 (two) times daily.  120 tablet  5  . vitamin C (ASCORBIC ACID) 500 MG tablet Take 1 tablet (500 mg total) by mouth daily.  30 tablet  1   No current facility-administered medications on file prior to visit.    Review of Systems Review of Systems  Constitutional: Negative for fever, appetite change, fatigue and unexpected weight change.  Eyes: Negative for pain and visual disturbance.  Respiratory: Negative for cough and shortness of breath.   Cardiovascular: Negative for cp or palpitations    Gastrointestinal: Negative for nausea, diarrhea and constipation.  Genitourinary: Negative for urgency and frequency.  Skin: Negative for pallor or rash  pos for residual scarring and pain from shingles MSK pos for chronic back pain  Neurological: Negative for weakness, light-headedness, numbness and headaches.  Hematological: Negative for adenopathy. Does not bruise/bleed easily.  Psychiatric/Behavioral: Negative for dysphoric mood. The patient is not nervous/anxious.         Objective:   Physical Exam  Constitutional: She appears well-developed and well-nourished. No distress.  overwt elderly female walking with walker  HENT:  Head: Normocephalic and atraumatic.  Eyes: Conjunctivae and EOM are normal. Pupils are equal, round, and reactive to light. No scleral icterus.  Neck: Normal range of motion. Neck supple. No JVD present. Carotid bruit is not present.  Cardiovascular: Normal rate, regular rhythm and intact distal pulses.  Exam reveals no gallop.   Pulmonary/Chest: Effort normal and breath sounds normal. No respiratory distress. She has no wheezes.  Abdominal: Soft. Bowel  sounds are normal. She exhibits no abdominal bruit.  Musculoskeletal: She exhibits no edema.  Lymphadenopathy:    She has no cervical adenopathy.  Neurological: She is alert. She has normal reflexes. No cranial nerve deficit. She exhibits normal muscle tone. Coordination normal.  Skin: Skin is warm and dry. No rash noted.  Scarring from shingles apparent L chest wall-improved   Psychiatric: She has a normal mood and affect.          Assessment & Plan:

## 2013-01-22 NOTE — Assessment & Plan Note (Signed)
Doing much better with inc gabapentin dose to 600 as needed Off narcotic  Overall much improved

## 2013-01-22 NOTE — Patient Instructions (Signed)
You can take 300 to 600 mg up to three times daily for your shingles pain For diabetes - make sure to get a yearly eye exam  Follow up in 6 months for annual exam with labs prior

## 2013-05-31 ENCOUNTER — Other Ambulatory Visit: Payer: Self-pay | Admitting: Family Medicine

## 2013-07-14 ENCOUNTER — Telehealth: Payer: Self-pay | Admitting: Family Medicine

## 2013-07-14 DIAGNOSIS — E785 Hyperlipidemia, unspecified: Secondary | ICD-10-CM

## 2013-07-14 DIAGNOSIS — M8580 Other specified disorders of bone density and structure, unspecified site: Secondary | ICD-10-CM

## 2013-07-14 DIAGNOSIS — E119 Type 2 diabetes mellitus without complications: Secondary | ICD-10-CM

## 2013-07-14 DIAGNOSIS — I10 Essential (primary) hypertension: Secondary | ICD-10-CM

## 2013-07-14 NOTE — Telephone Encounter (Signed)
Message copied by Abner Greenspan on Sat Jul 14, 2013 11:48 AM ------      Message from: Ellamae Sia      Created: Fri Jul 06, 2013 11:05 AM      Regarding: Lab orders for Monday, 4.6.15       Patient is scheduled for CPX labs, please order future labs, Thanks , Terri       ------

## 2013-07-16 ENCOUNTER — Other Ambulatory Visit (INDEPENDENT_AMBULATORY_CARE_PROVIDER_SITE_OTHER): Payer: Medicare Other

## 2013-07-16 DIAGNOSIS — M899 Disorder of bone, unspecified: Secondary | ICD-10-CM | POA: Diagnosis not present

## 2013-07-16 DIAGNOSIS — E785 Hyperlipidemia, unspecified: Secondary | ICD-10-CM | POA: Diagnosis not present

## 2013-07-16 DIAGNOSIS — M8580 Other specified disorders of bone density and structure, unspecified site: Secondary | ICD-10-CM

## 2013-07-16 DIAGNOSIS — M949 Disorder of cartilage, unspecified: Secondary | ICD-10-CM | POA: Diagnosis not present

## 2013-07-16 DIAGNOSIS — I10 Essential (primary) hypertension: Secondary | ICD-10-CM | POA: Diagnosis not present

## 2013-07-16 DIAGNOSIS — E119 Type 2 diabetes mellitus without complications: Secondary | ICD-10-CM

## 2013-07-16 LAB — MICROALBUMIN / CREATININE URINE RATIO
Creatinine,U: 110.8 mg/dL
Microalb Creat Ratio: 1.6 mg/g (ref 0.0–30.0)
Microalb, Ur: 1.8 mg/dL (ref 0.0–1.9)

## 2013-07-16 LAB — CBC WITH DIFFERENTIAL/PLATELET
BASOS ABS: 0 10*3/uL (ref 0.0–0.1)
Basophils Relative: 0.5 % (ref 0.0–3.0)
EOS ABS: 0 10*3/uL (ref 0.0–0.7)
Eosinophils Relative: 0.1 % (ref 0.0–5.0)
HEMATOCRIT: 36.4 % (ref 36.0–46.0)
Hemoglobin: 11.9 g/dL — ABNORMAL LOW (ref 12.0–15.0)
LYMPHS ABS: 2 10*3/uL (ref 0.7–4.0)
Lymphocytes Relative: 29.1 % (ref 12.0–46.0)
MCHC: 32.8 g/dL (ref 30.0–36.0)
MCV: 84.9 fl (ref 78.0–100.0)
MONOS PCT: 9.2 % (ref 3.0–12.0)
Monocytes Absolute: 0.6 10*3/uL (ref 0.1–1.0)
Neutro Abs: 4.2 10*3/uL (ref 1.4–7.7)
Neutrophils Relative %: 61.1 % (ref 43.0–77.0)
Platelets: 169 10*3/uL (ref 150.0–400.0)
RBC: 4.29 Mil/uL (ref 3.87–5.11)
RDW: 15.1 % — AB (ref 11.5–14.6)
WBC: 6.8 10*3/uL (ref 4.5–10.5)

## 2013-07-16 LAB — LIPID PANEL
Cholesterol: 144 mg/dL (ref 0–200)
HDL: 44.9 mg/dL (ref >39.00)
LDL Cholesterol: 53 mg/dL (ref 0–99)
Total CHOL/HDL Ratio: 3
Triglycerides: 229 mg/dL — ABNORMAL HIGH (ref 0.0–149.0)
VLDL: 45.8 mg/dL — AB (ref 0.0–40.0)

## 2013-07-16 LAB — COMPREHENSIVE METABOLIC PANEL
ALK PHOS: 65 U/L (ref 39–117)
ALT: 13 U/L (ref 0–35)
AST: 20 U/L (ref 0–37)
Albumin: 4 g/dL (ref 3.5–5.2)
BILIRUBIN TOTAL: 0.4 mg/dL (ref 0.3–1.2)
BUN: 18 mg/dL (ref 6–23)
CO2: 28 meq/L (ref 19–32)
CREATININE: 1.2 mg/dL (ref 0.4–1.2)
Calcium: 10.1 mg/dL (ref 8.4–10.5)
Chloride: 103 mEq/L (ref 96–112)
GFR: 57.38 mL/min — AB (ref >60.00)
Glucose, Bld: 125 mg/dL — ABNORMAL HIGH (ref 70–99)
Potassium: 4.2 mEq/L (ref 3.5–5.1)
Sodium: 138 mEq/L (ref 135–145)
Total Protein: 7.9 g/dL (ref 6.0–8.3)

## 2013-07-16 LAB — HEMOGLOBIN A1C: Hgb A1c MFr Bld: 6.8 % — ABNORMAL HIGH (ref 4.6–6.5)

## 2013-07-16 LAB — TSH: TSH: 3.39 u[IU]/mL (ref 0.35–5.50)

## 2013-07-17 LAB — VITAMIN D 25 HYDROXY (VIT D DEFICIENCY, FRACTURES): VIT D 25 HYDROXY: 48 ng/mL (ref 30–89)

## 2013-07-23 ENCOUNTER — Encounter: Payer: Self-pay | Admitting: Family Medicine

## 2013-07-23 ENCOUNTER — Ambulatory Visit (INDEPENDENT_AMBULATORY_CARE_PROVIDER_SITE_OTHER): Payer: Medicare Other | Admitting: Family Medicine

## 2013-07-23 VITALS — BP 124/68 | HR 68 | Temp 98.2°F | Ht 70.5 in | Wt 233.5 lb

## 2013-07-23 DIAGNOSIS — Z0000 Encounter for general adult medical examination without abnormal findings: Secondary | ICD-10-CM

## 2013-07-23 DIAGNOSIS — M899 Disorder of bone, unspecified: Secondary | ICD-10-CM | POA: Diagnosis not present

## 2013-07-23 DIAGNOSIS — E669 Obesity, unspecified: Secondary | ICD-10-CM

## 2013-07-23 DIAGNOSIS — M949 Disorder of cartilage, unspecified: Secondary | ICD-10-CM

## 2013-07-23 DIAGNOSIS — E785 Hyperlipidemia, unspecified: Secondary | ICD-10-CM | POA: Diagnosis not present

## 2013-07-23 DIAGNOSIS — E119 Type 2 diabetes mellitus without complications: Secondary | ICD-10-CM | POA: Diagnosis not present

## 2013-07-23 DIAGNOSIS — M8580 Other specified disorders of bone density and structure, unspecified site: Secondary | ICD-10-CM

## 2013-07-23 DIAGNOSIS — I10 Essential (primary) hypertension: Secondary | ICD-10-CM | POA: Diagnosis not present

## 2013-07-23 NOTE — Assessment & Plan Note (Signed)
Relatively stable Enc to work on low sugar diet and exercise as tol Lab Results  Component Value Date   HGBA1C 6.8* 07/16/2013    F/u 6 mo

## 2013-07-23 NOTE — Assessment & Plan Note (Signed)
Lab Results  Component Value Date   CHOL 144 07/16/2013   HDL 44.90 07/16/2013   LDLCALC 53 07/16/2013   LDLDIRECT 56.4 06/28/2012   TRIG 229.0* 07/16/2013   CHOLHDL 3 07/16/2013   Disc goals for lipids and reasons to control them Rev labs with pt Rev low sat fat diet in detail

## 2013-07-23 NOTE — Progress Notes (Signed)
Subjective:    Patient ID: Erin [name], female    DOB: Feb 18, 1929, 80 y.o.   MRN: [id number]  HPI I have personally reviewed the Medicare Annual Wellness questionnaire and have noted 1. The patient's medical and social history 2. Their use of alcohol, tobacco or illicit drugs 3. Their current medications and supplements 4. The patient's functional ability including ADL's, fall risks, home safety risks and hearing or visual             impairment. 5. Diet and physical activities 6. Evidence for depression or mood disorders  The patients weight, height, BMI have been recorded in the chart and visual acuity is per eye clinic.  I have made referrals, counseling and provided education to the patient based review of the above and I have provided the pt with a written personalized care plan for preventive services.  Still not able to wear a bra due to prev shingles - is gradually/ slowly improving   See scanned forms.  Routine anticipatory guidance given to patient.  See health maintenance. Colon cancer screening-5/12 colonoscopy - 5 year recall  Breast cancer screening 8/14 - will be due this summer  Self breast exam-no lumps at all  Flu vaccine 8/14  Tetanus vaccine 12/08 Pneumovax 12/08  Zoster vaccine-not ready for it since her shingles is still bothering her   Advance directive-has a living will set up  Cognitive function addressed- see scanned forms- and if abnormal then additional documentation follows. - for the most part doing ok - occ short term issues -family not concerns   PMH and SH reviewed  Meds, vitals, and allergies reviewed.   ROS: See HPI.  Otherwise negative.     Osteopenia - 7/12 dexa - wants to schedule it  She has lost some height  D level is 48 No fractures in the past years  No falls   Hyperlipidemia Lab Results  Component Value Date   CHOL 144 07/16/2013   CHOL 158 06/28/2012   CHOL 143 01/07/2012   Lab Results  Component Value Date   HDL 44.90  07/16/2013   HDL 42.60 06/28/2012   HDL 43.20 01/07/2012   Lab Results  Component Value Date   LDLCALC 53 07/16/2013   LDLCALC 71 01/07/2012   LDLCALC 62 03/29/2011   Lab Results  Component Value Date   TRIG 229.0* 07/16/2013   TRIG 216.0* 06/28/2012   TRIG 145.0 01/07/2012   Lab Results  Component Value Date   CHOLHDL 3 07/16/2013   CHOLHDL 4 06/28/2012   CHOLHDL 3 01/07/2012   Lab Results  Component Value Date   LDLDIRECT 56.4 06/28/2012   LDLDIRECT 165.7 09/23/2010   LDLDIRECT 110.1 03/23/2010   overall very stable lipitor and diet    Anemia- chronic  Stable Lab Results  Component Value Date   WBC 6.8 07/16/2013   HGB 11.9* 07/16/2013   HCT 36.4 07/16/2013   MCV 84.9 07/16/2013   PLT 169.0 07/16/2013    Wt is up 7 lb with bmi of 80 Pt is upset about this - "folks are making her eat" Obese Not much exercise   DM Lab Results  Component Value Date   HGBA1C 6.8* 07/16/2013   eye exam has not had her annual eye exam   Patient Active Problem List   Diagnosis Date Noted  . Post herpetic neuralgia 10/16/2012  . Encounter for Medicare annual wellness exam 07/05/2012  . Risk for falls 07/05/2012  . Herpes zoster 06/28/2012  .  Colon cancer H/O 04/07/2012  . Sacroiliac joint dysfunction 04/01/2012  . Sciatica 03/31/2012  . Low back pain 04/02/2011  . Frequent UTI 11/30/2010  . Other screening mammogram 09/30/2010  . DM2 (diabetes mellitus, type 2) 09/30/2010  . Post-menopausal 09/30/2010  . ABDOMINAL WALL HERNIA 02/26/2010  . Anemia 08/18/2009  . OBESITY 10/31/2007  . HYPERTENSION, ESSENTIAL NOS 10/31/2007  . HYPERLIPIDEMIA 07/26/2006  . PHLEBITIS, SUPERFICIAL LEG VEINS 07/26/2006  . HEMORRHOIDS 07/26/2006  . OSTEOARTHRITIS 07/26/2006  . Osteopenia 07/26/2006  . SLEEP DISORDER 07/26/2006   Past Medical History  Diagnosis Date  . Diabetes mellitus   . Hyperlipidemia   . Osteoarthritis   . Osteoporosis   . Hypertension   . Obesity   . Upper GI bleed     AV  malformation/when anticoag  . Sacroiliac joint dysfunction 04/01/2012   Past Surgical History  Procedure Laterality Date  . Appendectomy    . Cholecystectomy    . Colon resection  1988    secondary to cancer  . Polypectomy  1998    benign X 2  . Cervicitis  1964    conization   . Total knee arthroplasty  07/2009    right   . Endometrial polyp  05/2000    hyperplasia-laser treatment  . Joint replacement      Rt knee replacement   History  Substance Use Topics  . Smoking status: Former Smoker    Types: Cigarettes    Quit date: 08/14/1962  . Smokeless tobacco: Never Used     Comment: Quit over 10 years ago  . Alcohol Use: No   Family History  Problem Relation Age of Onset  . Stroke Father   . Cancer Brother     lung   Allergies  Allergen Reactions  . Diclofenac Sodium Other (See Comments)    : vomiting  . Fluoxetine Hcl Other (See Comments)     vomiting  . Shellfish Allergy Swelling   Current Outpatient Prescriptions on File Prior to Visit  Medication Sig Dispense Refill  . amLODipine (NORVASC) 5 MG tablet Take 1 tablet (5 mg total) by mouth daily.  30 tablet  11  . aspirin 325 MG EC tablet Take 1 tablet (325 mg total) by mouth daily.      Marland Kitchen atorvastatin (LIPITOR) 10 MG tablet Take 1 tablet (10 mg total) by mouth daily.  30 tablet  11  . calcium carbonate (OS-CAL) 600 MG TABS Take 1 tablet (600 mg total) by mouth 2 (two) times daily with a meal.  60 tablet    . gabapentin (NEURONTIN) 300 MG capsule Take 1-2 pills by mouth three times per day  180 capsule  5  . glucose blood (FREESTYLE LITE) test strip CHECK BLOOD SUGAR ONCE A DAY AS NEEDED FOR DM 250.00  100 each  1  . imipramine (TOFRANIL) 50 MG tablet Take 2 tablets (100 mg total) by mouth at bedtime.  60 tablet  11  . Lancets (FREESTYLE) lancets CHECK BLOOD SUGAR ONCE DAILY AS DIRECTEDAND AS NEEDED  100 each  5  . metFORMIN (GLUCOPHAGE) 500 MG tablet TAKE 1 TABLET BY MOUTH TWICE A DAY WITH A MEAL  60 tablet  5  .  metoprolol (LOPRESSOR) 50 MG tablet Take 2 tablets (100 mg total) by mouth 2 (two) times daily.  120 tablet  11  . Multiple Vitamin (MULTIVITAMIN WITH MINERALS) TABS Take 1 tablet by mouth daily.      Marland Kitchen omeprazole (PRILOSEC) 20 MG capsule Take 1  capsule (20 mg total) by mouth daily.  30 capsule  11  . senna-docusate (SENOKOT-S) 8.6-50 MG per tablet Take 2 tablets by mouth 2 (two) times daily.  120 tablet  5  . vitamin C (ASCORBIC ACID) 500 MG tablet Take 1 tablet (500 mg total) by mouth daily.  30 tablet  1   No current facility-administered medications on file prior to visit.    Review of Systems Review of Systems  Constitutional: Negative for fever, appetite change, fatigue and unexpected weight change.  Eyes: Negative for pain and visual disturbance.  Respiratory: Negative for cough and shortness of breath.   Cardiovascular: Negative for cp or palpitations    Gastrointestinal: Negative for nausea, diarrhea and constipation.  Genitourinary: Negative for urgency and frequency.  Skin: Negative for pallor and pos for rash on L trunk with pain that is gradually improving   Neurological: Negative for weakness, light-headedness, numbness and headaches.  Hematological: Negative for adenopathy. Does not bruise/bleed easily.  Psychiatric/Behavioral: Negative for dysphoric mood. The patient is not nervous/anxious.         Objective:   Physical Exam  Constitutional: She appears well-developed and well-nourished. No distress.  obese and well appearing   HENT:  Head: Normocephalic and atraumatic.  Right Ear: External ear normal.  Left Ear: External ear normal.  Mouth/Throat: Oropharynx is clear and moist.  Eyes: Conjunctivae and EOM are normal. Pupils are equal, round, and reactive to light. No scleral icterus.  Neck: Normal range of motion. Neck supple. No JVD present. Carotid bruit is not present. No thyromegaly present.  Cardiovascular: Normal rate, regular rhythm, normal heart sounds and  intact distal pulses.  Exam reveals no gallop.   Pulmonary/Chest: Effort normal and breath sounds normal. No respiratory distress. She has no wheezes. She exhibits no tenderness.  Abdominal: Soft. Bowel sounds are normal. She exhibits no distension, no abdominal bruit and no mass. There is no tenderness.  Genitourinary: No breast swelling, tenderness, discharge or bleeding.  Breast exam: No mass, nodules, thickening, tenderness, bulging, retraction, inflamation, nipple discharge or skin changes noted.  No axillary or clavicular LA.       Musculoskeletal: Normal range of motion. She exhibits no edema and no tenderness.  Lymphadenopathy:    She has no cervical adenopathy.  Neurological: She is alert. She has normal reflexes. No cranial nerve deficit. She exhibits normal muscle tone. Coordination normal.  Walks with walker  Skin: Skin is warm and dry. Rash noted. No erythema. No pallor.  Resolved rash with residual hyperpigmentation on L torso/trunk and breast  Psychiatric: She has a normal mood and affect.          Assessment & Plan:

## 2013-07-23 NOTE — Assessment & Plan Note (Signed)
Due for dexa Disc need for calcium/ vitamin D/ wt bearing exercise and bone density test every 2 y to monitor Disc safety/ fracture risk in detail

## 2013-07-23 NOTE — Assessment & Plan Note (Signed)
Discussed how this problem influences overall health and the risks it imposes  Reviewed plan for weight loss with lower calorie diet (via better food choices and also portion control or program like weight watchers) and exercise building up to or more than 30 minutes 5 days per week including some aerobic activity    

## 2013-07-23 NOTE — Progress Notes (Signed)
Pre visit review using our clinic review tool, if applicable. No additional management support is needed unless otherwise documented below in the visit note. 

## 2013-07-23 NOTE — Assessment & Plan Note (Signed)
bp in fair control at this time  BP Readings from Last 1 Encounters:  07/23/13 124/68   No changes needed Disc lifstyle change with low sodium diet and exercise  Labs rev

## 2013-07-23 NOTE — Patient Instructions (Signed)
There are many products for dry feet and heels - some have shea butter or tea tree oil or lanolin - buy a product designed for heels/feet Don't forget to schedule your annual mammogram this summer Stop up front for a bone density test referral  Also do not forget to get your annual diabetic eye exam

## 2013-07-23 NOTE — Assessment & Plan Note (Signed)
Reviewed health habits including diet and exercise and skin cancer prevention Reviewed appropriate screening tests for age  Also reviewed health mt list, fam hx and immunization status , as well as social and family history   Labs reviewed  

## 2013-07-24 ENCOUNTER — Telehealth: Payer: Self-pay | Admitting: Family Medicine

## 2013-07-24 NOTE — Telephone Encounter (Signed)
Relevant patient education mailed to patient.  

## 2013-07-30 ENCOUNTER — Other Ambulatory Visit: Payer: Self-pay | Admitting: Family Medicine

## 2013-08-01 ENCOUNTER — Telehealth: Payer: Self-pay

## 2013-08-01 NOTE — Telephone Encounter (Signed)
Has been submitted through covermymeds--awaiting response

## 2013-08-02 NOTE — Telephone Encounter (Signed)
Pt left v/m requesting cb status of omeprazole PA.

## 2013-08-06 NOTE — Telephone Encounter (Signed)
[facility] asked for additional information--questions were answered and resubmitted

## 2013-08-07 ENCOUNTER — Ambulatory Visit
Admission: RE | Admit: 2013-08-07 | Discharge: 2013-08-07 | Disposition: A | Discharge status: Home / Self Care | Payer: Medicare Other | Source: Ambulatory Visit | Attending: Family Medicine | Admitting: Family Medicine

## 2013-08-07 DIAGNOSIS — M899 Disorder of bone, unspecified: Secondary | ICD-10-CM | POA: Diagnosis not present

## 2013-08-07 DIAGNOSIS — M8580 Other specified disorders of bone density and structure, unspecified site: Secondary | ICD-10-CM

## 2013-08-07 DIAGNOSIS — M949 Disorder of cartilage, unspecified: Secondary | ICD-10-CM | POA: Diagnosis not present

## 2013-08-07 LAB — HM DEXA SCAN

## 2013-08-11 ENCOUNTER — Other Ambulatory Visit: Payer: Self-pay | Admitting: Family Medicine

## 2013-08-13 NOTE — Telephone Encounter (Signed)
Please refill for a year thanks 

## 2013-08-13 NOTE — Telephone Encounter (Signed)
done

## 2013-08-13 NOTE — Telephone Encounter (Signed)
Electronic refill request, please advise  

## 2013-08-14 ENCOUNTER — Encounter: Payer: Self-pay | Admitting: *Deleted

## 2013-08-14 ENCOUNTER — Encounter: Payer: Self-pay | Admitting: Family Medicine

## 2013-08-16 ENCOUNTER — Telehealth: Payer: Self-pay | Admitting: *Deleted

## 2013-08-16 NOTE — Telephone Encounter (Deleted)
PA form received for pt's omeprazole, from placed in your inbox

## 2013-08-16 NOTE — Telephone Encounter (Signed)
PA was done online for Omeprazole it was was declined, letter placed in your inbox

## 2013-08-17 NOTE — Telephone Encounter (Signed)
Let her know they denied it - she takes omeprazole 20 mg daily- she will have to get prilosec otc 20 mg (is the same)- best deal is usually at places like [facility] or [facility] or [employer]

## 2013-08-22 NOTE — Telephone Encounter (Signed)
Pt.notified

## 2013-09-05 DIAGNOSIS — H18419 Arcus senilis, unspecified eye: Secondary | ICD-10-CM | POA: Diagnosis not present

## 2013-09-05 DIAGNOSIS — H35039 Hypertensive retinopathy, unspecified eye: Secondary | ICD-10-CM | POA: Diagnosis not present

## 2013-09-05 DIAGNOSIS — I10 Essential (primary) hypertension: Secondary | ICD-10-CM | POA: Diagnosis not present

## 2013-09-05 DIAGNOSIS — H2510 Age-related nuclear cataract, unspecified eye: Secondary | ICD-10-CM | POA: Diagnosis not present

## 2013-09-27 ENCOUNTER — Other Ambulatory Visit: Payer: Self-pay | Admitting: Family Medicine

## 2013-10-17 DIAGNOSIS — M79609 Pain in unspecified limb: Secondary | ICD-10-CM | POA: Diagnosis not present

## 2013-10-17 DIAGNOSIS — B351 Tinea unguium: Secondary | ICD-10-CM | POA: Diagnosis not present

## 2013-11-29 ENCOUNTER — Encounter: Payer: Self-pay | Admitting: Gastroenterology

## 2013-12-13 ENCOUNTER — Other Ambulatory Visit: Payer: Self-pay | Admitting: Family Medicine

## 2013-12-26 ENCOUNTER — Other Ambulatory Visit: Payer: Self-pay | Admitting: Family Medicine

## 2013-12-31 ENCOUNTER — Other Ambulatory Visit: Payer: Self-pay | Admitting: Family Medicine

## 2014-01-01 NOTE — Telephone Encounter (Signed)
Will refill electronically  

## 2014-01-01 NOTE — Telephone Encounter (Signed)
Electronic refill request, please advise  

## 2014-01-03 ENCOUNTER — Encounter (HOSPITAL_COMMUNITY): Payer: Self-pay | Admitting: Emergency Medicine

## 2014-01-03 ENCOUNTER — Emergency Department (HOSPITAL_COMMUNITY)
Admission: EM | Admit: 2014-01-03 | Discharge: 2014-01-04 | Disposition: A | Discharge status: Home / Self Care | Payer: Medicare Other | Attending: Emergency Medicine | Admitting: Emergency Medicine

## 2014-01-03 ENCOUNTER — Emergency Department (HOSPITAL_COMMUNITY): Payer: Medicare Other

## 2014-01-03 DIAGNOSIS — W19XXXA Unspecified fall, initial encounter: Secondary | ICD-10-CM

## 2014-01-03 DIAGNOSIS — R296 Repeated falls: Secondary | ICD-10-CM | POA: Insufficient documentation

## 2014-01-03 DIAGNOSIS — Z7982 Long term (current) use of aspirin: Secondary | ICD-10-CM | POA: Diagnosis not present

## 2014-01-03 DIAGNOSIS — I10 Essential (primary) hypertension: Secondary | ICD-10-CM | POA: Diagnosis not present

## 2014-01-03 DIAGNOSIS — E119 Type 2 diabetes mellitus without complications: Secondary | ICD-10-CM | POA: Diagnosis not present

## 2014-01-03 DIAGNOSIS — Z87891 Personal history of nicotine dependence: Secondary | ICD-10-CM | POA: Diagnosis not present

## 2014-01-03 DIAGNOSIS — Y9289 Other specified places as the place of occurrence of the external cause: Secondary | ICD-10-CM | POA: Insufficient documentation

## 2014-01-03 DIAGNOSIS — Y9389 Activity, other specified: Secondary | ICD-10-CM | POA: Diagnosis not present

## 2014-01-03 DIAGNOSIS — Z9889 Other specified postprocedural states: Secondary | ICD-10-CM | POA: Diagnosis not present

## 2014-01-03 DIAGNOSIS — R1032 Left lower quadrant pain: Secondary | ICD-10-CM

## 2014-01-03 DIAGNOSIS — R110 Nausea: Secondary | ICD-10-CM | POA: Diagnosis not present

## 2014-01-03 DIAGNOSIS — Z79899 Other long term (current) drug therapy: Secondary | ICD-10-CM | POA: Insufficient documentation

## 2014-01-03 DIAGNOSIS — S3981XA Other specified injuries of abdomen, initial encounter: Secondary | ICD-10-CM | POA: Diagnosis not present

## 2014-01-03 DIAGNOSIS — E785 Hyperlipidemia, unspecified: Secondary | ICD-10-CM | POA: Diagnosis not present

## 2014-01-03 DIAGNOSIS — Z9089 Acquired absence of other organs: Secondary | ICD-10-CM | POA: Diagnosis not present

## 2014-01-03 DIAGNOSIS — M1990 Unspecified osteoarthritis, unspecified site: Secondary | ICD-10-CM | POA: Insufficient documentation

## 2014-01-03 DIAGNOSIS — E669 Obesity, unspecified: Secondary | ICD-10-CM | POA: Diagnosis not present

## 2014-01-03 DIAGNOSIS — Z8719 Personal history of other diseases of the digestive system: Secondary | ICD-10-CM | POA: Insufficient documentation

## 2014-01-03 DIAGNOSIS — R109 Unspecified abdominal pain: Secondary | ICD-10-CM | POA: Diagnosis not present

## 2014-01-03 DIAGNOSIS — M810 Age-related osteoporosis without current pathological fracture: Secondary | ICD-10-CM | POA: Insufficient documentation

## 2014-01-03 LAB — COMPREHENSIVE METABOLIC PANEL
ALT: 13 U/L (ref 0–35)
ANION GAP: 13 (ref 5–15)
AST: 34 U/L (ref 0–37)
Albumin: 3.8 g/dL (ref 3.5–5.2)
Alkaline Phosphatase: 78 U/L (ref 39–117)
BUN: 13 mg/dL (ref 6–23)
CO2: 24 meq/L (ref 19–32)
Calcium: 9.8 mg/dL (ref 8.4–10.5)
Chloride: 102 mEq/L (ref 96–112)
Creatinine, Ser: 1.01 mg/dL (ref 0.50–1.10)
GFR calc Af Amer: 58 mL/min — ABNORMAL LOW (ref >90)
GFR, EST NON AFRICAN AMERICAN: 50 mL/min — AB (ref >90)
GLUCOSE: 100 mg/dL — AB (ref 70–99)
POTASSIUM: 5.3 meq/L (ref 3.7–5.3)
Sodium: 139 mEq/L (ref 137–147)
Total Bilirubin: 0.3 mg/dL (ref 0.3–1.2)
Total Protein: 8 g/dL (ref 6.0–8.3)

## 2014-01-03 LAB — CBC WITH DIFFERENTIAL/PLATELET
BASOS PCT: 0 % (ref 0–1)
Basophils Absolute: 0 10*3/uL (ref 0.0–0.1)
Eosinophils Absolute: 0 10*3/uL (ref 0.0–0.7)
Eosinophils Relative: 0 % (ref 0–5)
HCT: 37.1 % (ref 36.0–46.0)
Hemoglobin: 12 g/dL (ref 12.0–15.0)
Lymphocytes Relative: 32 % (ref 12–46)
Lymphs Abs: 2.2 10*3/uL (ref 0.7–4.0)
MCH: 27.5 pg (ref 26.0–34.0)
MCHC: 32.3 g/dL (ref 30.0–36.0)
MCV: 85.1 fL (ref 78.0–100.0)
Monocytes Absolute: 0.5 10*3/uL (ref 0.1–1.0)
Monocytes Relative: 8 % (ref 3–12)
NEUTROS ABS: 4 10*3/uL (ref 1.7–7.7)
NEUTROS PCT: 60 % (ref 43–77)
Platelets: 171 10*3/uL (ref 150–400)
RBC: 4.36 MIL/uL (ref 3.87–5.11)
RDW: 15 % (ref 11.5–15.5)
WBC: 6.8 10*3/uL (ref 4.0–10.5)

## 2014-01-03 LAB — URINALYSIS, ROUTINE W REFLEX MICROSCOPIC
Bilirubin Urine: NEGATIVE
GLUCOSE, UA: NEGATIVE mg/dL
HGB URINE DIPSTICK: NEGATIVE
KETONES UR: NEGATIVE mg/dL
Nitrite: NEGATIVE
PROTEIN: NEGATIVE mg/dL
Specific Gravity, Urine: 1.016 (ref 1.005–1.030)
Urobilinogen, UA: 0.2 mg/dL (ref 0.0–1.0)
pH: 6.5 (ref 5.0–8.0)

## 2014-01-03 LAB — URINE MICROSCOPIC-ADD ON

## 2014-01-03 LAB — LIPASE, BLOOD: Lipase: 29 U/L (ref 11–59)

## 2014-01-03 MED ORDER — FENTANYL CITRATE 0.05 MG/ML IJ SOLN
50.0000 ug | INTRAMUSCULAR | Status: DC | PRN
  Administered 2014-01-03: 50 ug via INTRAVENOUS
  Filled 2014-01-03: qty 2

## 2014-01-03 NOTE — ED Notes (Signed)
The pt has had llq pain since 1200n today.  Nauseated no vomiting.  No back pain

## 2014-01-03 NOTE — ED Provider Notes (Signed)
CSN: [id number]     Arrival date & time 01/03/14  1933 History   First MD Initiated Contact with Patient 01/03/14 2303     Chief Complaint  Patient presents with  . Abdominal Pain     (Consider location/radiation/quality/duration/timing/severity/associated sxs/prior Treatment) HPI Comments: 80 year old female with history of colon cancer, lipids, high blood pressure, arthritis, diabetes type 2, fall risk presents with left groin pain intermittent since yesterday, gradually worsening, last minutes at a time. Worse with position and palpation anterior groin. No history of similar. Patient did fall recently and landed on her buttocks however denied focal hip pain. No history of kidney stones or diverticulitis. No fevers chills or vomiting. Pain nonradiating.  Patient is a 80 y.o. female presenting with abdominal pain. The history is provided by the patient and a relative.  Abdominal Pain Associated symptoms: nausea   Associated symptoms: no chest pain, no chills, no diarrhea, no dysuria, no fever, no shortness of breath and no vomiting     Past Medical History  Diagnosis Date  . Diabetes mellitus   . Hyperlipidemia   . Osteoarthritis   . Osteoporosis   . Hypertension   . Obesity   . Upper GI bleed     AV malformation/when anticoag  . Sacroiliac joint dysfunction 04/01/2012   Past Surgical History  Procedure Laterality Date  . Appendectomy    . Cholecystectomy    . Colon resection  1988    secondary to cancer  . Polypectomy  1998    benign X 2  . Cervicitis  1964    conization   . Total knee arthroplasty  07/2009    right   . Endometrial polyp  05/2000    hyperplasia-laser treatment  . Joint replacement      Rt knee replacement   Family History  Problem Relation Age of Onset  . Stroke Father   . Cancer Brother     lung   History  Substance Use Topics  . Smoking status: Former Smoker    Types: Cigarettes    Quit date: 08/14/1962  . Smokeless tobacco: Never Used      Comment: Quit over 10 years ago  . Alcohol Use: No   OB History   Grav Para Term Preterm Abortions TAB SAB Ect Mult Living                 Review of Systems  Constitutional: Negative for fever and chills.  HENT: Negative for congestion.   Eyes: Negative for visual disturbance.  Respiratory: Negative for shortness of breath.   Cardiovascular: Negative for chest pain.  Gastrointestinal: Positive for nausea and abdominal pain. Negative for vomiting, diarrhea and blood in stool.  Genitourinary: Negative for dysuria and flank pain.  Musculoskeletal: Negative for back pain, neck pain and neck stiffness.  Skin: Negative for rash.  Neurological: Negative for light-headedness and headaches.      Allergies  Diclofenac sodium; Fluoxetine hcl; and Shellfish allergy  Home Medications   Prior to Admission medications   Medication Sig Start Date End Date Taking? Authorizing Provider  amLODipine (NORVASC) 5 MG tablet Take 5 mg by mouth daily.   Yes Historical Provider, MD  aspirin 325 MG tablet Take 325 mg by mouth daily.   Yes Historical Provider, MD  atorvastatin (LIPITOR) 10 MG tablet Take 10 mg by mouth daily.   Yes Historical Provider, MD  calcium carbonate (OS-CAL) 600 MG TABS tablet Take 600 mg by mouth 2 (two) times daily with a meal.  Yes Historical Provider, MD  gabapentin (NEURONTIN) 300 MG capsule Take 600 mg by mouth 3 (three) times daily.   Yes Historical Provider, MD  imipramine (TOFRANIL) 50 MG tablet Take 100 mg by mouth at bedtime.   Yes Historical Provider, MD  metFORMIN (GLUCOPHAGE) 500 MG tablet Take 500 mg by mouth 2 (two) times daily with a meal.   Yes Historical Provider, MD  metoprolol (LOPRESSOR) 50 MG tablet Take 100 mg by mouth 2 (two) times daily.   Yes Historical Provider, MD  Multiple Vitamins-Minerals (MULTIVITAMIN WITH MINERALS) tablet Take 1 tablet by mouth daily.   Yes Historical Provider, MD  omeprazole (PRILOSEC) 20 MG capsule Take 20 mg by mouth daily.    Yes Historical Provider, MD  sennosides-docusate sodium (SENOKOT-S) 8.6-50 MG tablet Take 2 tablets by mouth 2 (two) times daily.   Yes Historical Provider, MD  vitamin C (ASCORBIC ACID) 500 MG tablet Take 500 mg by mouth daily.   Yes Historical Provider, MD   BP 137/54  Pulse 69  Temp(Src) 98.4 F (36.9 C)  Resp 16  Ht 5' 10.5" (1.791 m)  Wt 220 lb (99.791 kg)  BMI 31.11 kg/m2  SpO2 100% Physical Exam  Nursing note and vitals reviewed. Constitutional: She is oriented to person, place, and time. She appears well-developed and well-nourished.  HENT:  Head: Normocephalic and atraumatic.  Eyes: Conjunctivae are normal. Right eye exhibits no discharge. Left eye exhibits no discharge.  Neck: Normal range of motion. Neck supple. No tracheal deviation present.  Cardiovascular: Normal rate and regular rhythm.   Pulmonary/Chest: Effort normal and breath sounds normal.  Abdominal: Soft. She exhibits no distension. There is tenderness (mild anterior groin without swelling or sign of infection.). There is no guarding.  Musculoskeletal: She exhibits tenderness. She exhibits no edema.  Patient has no focal hip or groin pain with flexion internal/external rotation the hip. Neurovascular intact left lower leg.  Neurological: She is alert and oriented to person, place, and time.  Skin: Skin is warm. No rash noted.  Psychiatric: She has a normal mood and affect.    ED Course  Procedures (including critical care time) Labs Review Labs Reviewed  COMPREHENSIVE METABOLIC PANEL - Abnormal; Notable for the following:    Glucose, Bld 100 (*)    GFR calc non Af Amer 50 (*)    GFR calc Af Amer 58 (*)    All other components within normal limits  URINALYSIS, ROUTINE W REFLEX MICROSCOPIC - Abnormal; Notable for the following:    Leukocytes, UA MODERATE (*)    All other components within normal limits  URINE MICROSCOPIC-ADD ON - Abnormal; Notable for the following:    Squamous Epithelial / LPF FEW (*)     Bacteria, UA FEW (*)    All other components within normal limits  CBC WITH DIFFERENTIAL  LIPASE, BLOOD    Imaging Review Ct Renal Stone Study  01/04/2014   CLINICAL DATA:  Intermittent left groin painRecent fall. Evaluate for stone or fracture.  EXAM: CT ABDOMEN AND PELVIS WITHOUT CONTRAST  TECHNIQUE: Multidetector CT imaging of the abdomen and pelvis was performed following the standard protocol without IV contrast.  COMPARISON:  04/16/2012  FINDINGS: BODY WALL: Multiple fatty midline abdominal wall hernias which appear incisional. The largest is infraumbilical.  LOWER CHEST: No acute findings.  ABDOMEN/PELVIS:  Liver: No focal abnormality.  Biliary: Cholecystectomy.  Pancreas: Unremarkable.  Spleen: Unremarkable.  Adrenals: Unremarkable.  Kidneys and ureters: Bilateral renal hilar calcifications are predominantly arterial. There are  likely also small urinary calculi, most convincing in the upper poles. Bilateral renal cortical lobulation and thinning consistent with scarring, more extensive on the right. No hydronephrosis or ureteral calculus. These findings are largely stable from 2014.  Bladder: Unremarkable.  Reproductive: Degenerated 3 cm fibroid within the fundic uterus.  Bowel: No bowel obstruction. Mild to moderate distal colonic diverticulosis. No pericecal inflammation.  Retroperitoneum: No mass or adenopathy.  Peritoneum: No ascites or pneumoperitoneum.  Vascular: No acute abnormality.  OSSEOUS: No acute fracture. Advanced degenerative disc disease at L3-4 and below with disc narrowing and spurring causing bilateral foraminal stenosis.  IMPRESSION: 1. No acute intra-abdominal findings. No definitive cause of acute left flank pain. 2. Multiple chronic findings are stable from 2014 and described above.   Electronically Signed   By: Jorje Guild M.D.   On: 01/04/2014 00:01     EKG Interpretation None      MDM   Final diagnoses:  Left groin pain  Fall, initial encounter    Well-appearing female with intermittent groin pain worse with position and movement. Discussed differential diagnosis including fracture from recent fall, kidney stone, other. Pain controlled in the ER. Blood work reviewed unremarkable. CT pending  Patient pain improved with pain meds in ER. CT results reviewed no acute findings. Patient does not have any mid or lower femur tenderness, x-ray canceled. Pain is anterior groin discussed musculoskeletal/ligament. Patient has normal distal pulses, no leg swelling or classic risk for blood clots. Family comfortable close outpatient followup, pain meds as needed.  Results and differential diagnosis were discussed with the patient/parent/guardian. Close follow up outpatient was discussed, comfortable with the plan.   Medications  fentaNYL (SUBLIMAZE) injection 50 mcg (50 mcg Intravenous Given 01/03/14 2333)  HYDROcodone-acetaminophen (NORCO/VICODIN) 5-325 MG per tablet 1 tablet (not administered)  morphine 4 MG/ML injection 4 mg (4 mg Intravenous Given 01/04/14 0104)    Filed Vitals:   01/03/14 1945 01/03/14 2246 01/04/14 0000 01/04/14 0130  BP: 141/47 137/54 152/68 128/54  Pulse: 66 69 68 67  Temp: 98.4 F (36.9 C)     Resp: 20 16  16   Height: 5' 10.5" (1.791 m)     Weight: 220 lb (99.791 kg)     SpO2: 96% 100% 98% 98%    Final diagnoses:  Left groin pain  Fall, initial encounter        Mariea Clonts, MD 01/04/14 0151

## 2014-01-04 ENCOUNTER — Ambulatory Visit (INDEPENDENT_AMBULATORY_CARE_PROVIDER_SITE_OTHER): Payer: Medicare Other | Admitting: Family Medicine

## 2014-01-04 ENCOUNTER — Encounter: Payer: Self-pay | Admitting: Family Medicine

## 2014-01-04 VITALS — BP 136/78 | HR 74 | Temp 98.2°F | Wt 235.5 lb

## 2014-01-04 DIAGNOSIS — R109 Unspecified abdominal pain: Secondary | ICD-10-CM

## 2014-01-04 DIAGNOSIS — S3981XA Other specified injuries of abdomen, initial encounter: Secondary | ICD-10-CM | POA: Diagnosis not present

## 2014-01-04 MED ORDER — MORPHINE SULFATE 4 MG/ML IJ SOLN
4.0000 mg | Freq: Once | INTRAMUSCULAR | Status: AC
  Administered 2014-01-04: 4 mg via INTRAVENOUS
  Filled 2014-01-04: qty 1

## 2014-01-04 MED ORDER — HYDROCODONE-ACETAMINOPHEN 5-325 MG PO TABS
1.0000 | ORAL_TABLET | Freq: Once | ORAL | Status: AC
  Administered 2014-01-04: 1 via ORAL
  Filled 2014-01-04: qty 1

## 2014-01-04 MED ORDER — HYDROCODONE-ACETAMINOPHEN 5-325 MG PO TABS: 1.0000 | ORAL_TABLET | Freq: Four times a day (QID) | ORAL | Status: DC | PRN

## 2014-01-04 MED ORDER — HYDROCODONE-ACETAMINOPHEN 5-325 MG PO TABS: 1.0000 | ORAL_TABLET | ORAL | Status: DC | PRN

## 2014-01-04 MED ORDER — HYDROCODONE-ACETAMINOPHEN 5-325 MG PO TABS: 1.0000 | ORAL_TABLET | Freq: Once | ORAL | Status: DC

## 2014-01-04 NOTE — Progress Notes (Signed)
Pre visit review using our clinic review tool, if applicable. No additional management support is needed unless otherwise documented below in the visit note.  Slipped out of a chair and fell to floor 1 week ago.  No LOC. Fell on bottom. Was able to get up with help. She was down for a few hours.  Didn't have pain at that point.  No falls since then.  Pain started 2 days ago.  No clear trigger other than the fall, and that wasn't immediately before the pain.  Still with L groin pain. No FCVD.   No blood in stool.  Normal BMs.  No burning with urination.  This doesn't feel like prev UTIs.  Pain is not constant.  It can last for about a few minutes and then goes away on its on.  No pain with leaning, bending, twisting. No rash.  No bruise.  No radicular leg pain.  No R sided pain.   Meds, vitals, and allergies reviewed.   ROS: See HPI.  Otherwise, noncontributory.  nad In wheelchair rrr ctab Abd soft but L lower obliques ttp on testing, no hernia noted, no mass, no redness.  L hip with normal ROM

## 2014-01-04 NOTE — Patient Instructions (Signed)
This looks like an oblique strain.  It should slowly get better.  Take the vicodin if needed.  It can make you drowsy and constipated.   Take care.  Notify Dr. Glori Bickers if your aren't improving in a few days.  Glad to see you.  Keep a phone with you when you are at home.

## 2014-01-04 NOTE — ED Notes (Signed)
Patient stood with assistance and had pain when walking/standing. Slightly unsteady.

## 2014-01-04 NOTE — Discharge Instructions (Signed)
If you were given medicines take as directed.  If you are on coumadin or contraceptives realize their levels and effectiveness is altered by many different medicines.  If you have any reaction (rash, tongues swelling, other) to the medicines stop taking and see a physician.   Please follow up as directed and return to the ER or see a physician for new or worsening symptoms.  Thank you. Filed Vitals:   01/03/14 1945 01/03/14 2246 01/04/14 0000 01/04/14 0130  BP: 141/47 137/54 152/68 128/54  Pulse: 66 69 68 67  Temp: 98.4 F (36.9 C)     Resp: 20 16  16   Height: 5' 10.5" (1.791 m)     Weight: 220 lb (99.791 kg)     SpO2: 96% 100% 98% 98%    Fall Prevention and Home Safety Falls cause injuries and can affect all age groups. It is possible to use preventive measures to significantly decrease the likelihood of falls. There are many simple measures which can make your home safer and prevent falls. OUTDOORS  Repair cracks and edges of walkways and driveways.  Remove high doorway thresholds.  Trim shrubbery on the main path into your home.  Have good outside lighting.  Clear walkways of tools, rocks, debris, and clutter.  Check that handrails are not broken and are securely fastened. Both sides of steps should have handrails.  Have leaves, snow, and ice cleared regularly.  Use sand or salt on walkways during winter months.  In the garage, clean up grease or oil spills. BATHROOM  Install night lights.  Install grab bars by the toilet and in the tub and shower.  Use non-skid mats or decals in the tub or shower.  Place a plastic non-slip stool in the shower to sit on, if needed.  Keep floors dry and clean up all water on the floor immediately.  Remove soap buildup in the tub or shower on a regular basis.  Secure bath mats with non-slip, double-sided rug tape.  Remove throw rugs and tripping hazards from the floors. BEDROOMS  Install night lights.  Make sure a bedside  light is easy to reach.  Do not use oversized bedding.  Keep a telephone by your bedside.  Have a firm chair with side arms to use for getting dressed.  Remove throw rugs and tripping hazards from the floor. KITCHEN  Keep handles on pots and pans turned toward the center of the stove. Use back burners when possible.  Clean up spills quickly and allow time for drying.  Avoid walking on wet floors.  Avoid hot utensils and knives.  Position shelves so they are not too high or low.  Place commonly used objects within easy reach.  If necessary, use a sturdy step stool with a grab bar when reaching.  Keep electrical cables out of the way.  Do not use floor polish or wax that makes floors slippery. If you must use wax, use non-skid floor wax.  Remove throw rugs and tripping hazards from the floor. STAIRWAYS  Never leave objects on stairs.  Place handrails on both sides of stairways and use them. Fix any loose handrails. Make sure handrails on both sides of the stairways are as long as the stairs.  Check carpeting to make sure it is firmly attached along stairs. Make repairs to worn or loose carpet promptly.  Avoid placing throw rugs at the top or bottom of stairways, or properly secure the rug with carpet tape to prevent slippage. Get rid of  throw rugs, if possible.  Have an electrician put in a light switch at the top and bottom of the stairs. OTHER FALL PREVENTION TIPS  Wear low-heel or rubber-soled shoes that are supportive and fit well. Wear closed toe shoes.  When using a stepladder, make sure it is fully opened and both spreaders are firmly locked. Do not climb a closed stepladder.  Add color or contrast paint or tape to grab bars and handrails in your home. Place contrasting color strips on first and last steps.  Learn and use mobility aids as needed. Install an electrical emergency response system.  Turn on lights to avoid dark areas. Replace light bulbs that burn  out immediately. Get light switches that glow.  Arrange furniture to create clear pathways. Keep furniture in the same place.  Firmly attach carpet with non-skid or double-sided tape.  Eliminate uneven floor surfaces.  Select a carpet pattern that does not visually hide the edge of steps.  Be aware of all pets. OTHER HOME SAFETY TIPS  Set the water temperature for 120 F (48.8 C).  Keep emergency numbers on or near the telephone.  Keep smoke detectors on every level of the home and near sleeping areas. Document Released: 03/19/2002 Document Revised: 09/28/2011 Document Reviewed: 06/18/2011 [facility] Patient Information 2015 [place], [place]. This information is not intended to replace advice given to you by your health care provider. Make sure you discuss any questions you have with your health care provider.

## 2014-01-06 DIAGNOSIS — R109 Unspecified abdominal pain: Secondary | ICD-10-CM | POA: Insufficient documentation

## 2014-01-06 NOTE — Assessment & Plan Note (Signed)
This looks like an oblique strain. It should slowly get better. Use vicodin if needed. Routine medcautions given. She'll notify Dr. Glori Bickers if she isn't improving in a few days.  F/u prn.

## 2014-01-07 ENCOUNTER — Encounter: Payer: Self-pay | Admitting: Family Medicine

## 2014-01-07 ENCOUNTER — Ambulatory Visit (INDEPENDENT_AMBULATORY_CARE_PROVIDER_SITE_OTHER): Payer: Medicare Other | Admitting: Family Medicine

## 2014-01-07 ENCOUNTER — Ambulatory Visit (INDEPENDENT_AMBULATORY_CARE_PROVIDER_SITE_OTHER)
Admission: RE | Admit: 2014-01-07 | Discharge: 2014-01-07 | Disposition: A | Discharge status: Home / Self Care | Payer: Medicare Other | Source: Ambulatory Visit | Attending: Family Medicine | Admitting: Family Medicine

## 2014-01-07 ENCOUNTER — Telehealth: Payer: Self-pay | Admitting: Family Medicine

## 2014-01-07 VITALS — BP 142/70 | HR 95 | Temp 98.0°F | Ht 70.5 in | Wt 231.8 lb

## 2014-01-07 DIAGNOSIS — R109 Unspecified abdominal pain: Secondary | ICD-10-CM

## 2014-01-07 DIAGNOSIS — S79919A Unspecified injury of unspecified hip, initial encounter: Secondary | ICD-10-CM | POA: Diagnosis not present

## 2014-01-07 DIAGNOSIS — R1032 Left lower quadrant pain: Secondary | ICD-10-CM | POA: Insufficient documentation

## 2014-01-07 DIAGNOSIS — R112 Nausea with vomiting, unspecified: Secondary | ICD-10-CM | POA: Diagnosis not present

## 2014-01-07 DIAGNOSIS — R110 Nausea: Secondary | ICD-10-CM | POA: Insufficient documentation

## 2014-01-07 DIAGNOSIS — N3000 Acute cystitis without hematuria: Secondary | ICD-10-CM

## 2014-01-07 DIAGNOSIS — N390 Urinary tract infection, site not specified: Secondary | ICD-10-CM | POA: Insufficient documentation

## 2014-01-07 MED ORDER — ONDANSETRON HCL 4 MG PO TABS: 4.0000 mg | ORAL_TABLET | Freq: Three times a day (TID) | ORAL | Status: DC | PRN

## 2014-01-07 MED ORDER — CEPHALEXIN 250 MG PO CAPS: 250.0000 mg | ORAL_CAPSULE | Freq: Two times a day (BID) | ORAL | Status: DC

## 2014-01-07 NOTE — Telephone Encounter (Signed)
Pt's niece notified.

## 2014-01-07 NOTE — Assessment & Plan Note (Signed)
Looked at her ua from hosp-had many wbc  Will tx with keflex and send for cx

## 2014-01-07 NOTE — Telephone Encounter (Signed)
Stay off the hydrocodone and continue tylenol instead  I will see her this afternoon as planned to disc further

## 2014-01-07 NOTE — Assessment & Plan Note (Signed)
Xray of hip today Reviewed her CT  Likely strain from her fall She may end up needing some PT

## 2014-01-07 NOTE — Assessment & Plan Note (Addendum)
Likely multifactorial- from hydrocodone and constipation and poss uti  Given zofran 4 mg to use with caution  Disc imp of hydration  Reviewed ER notes and studies in detail with pt and her caregiver

## 2014-01-07 NOTE — Progress Notes (Signed)
Subjective:    Patient ID: Erin [name], female    DOB: [birth year], 80 y.o.   MRN: [id number]  HPI Here for f/u of ER visit after fall with groin pain and abd pain   9/18- slipped getting up and slipped and fell on her bottom from her chair -no pain at all  Several days later - achey in legs/groin  Then pain worsened quickly   Went to the ER  CT scan of abd and pelvis was normal and they d/c her because she was feeling better   They gave her hydrocodone for her pain - that caused nausea and vomiting  Also very constipated   Was supposed to take sennekot twice daily - may have missed some doses (her family noted)   No BM since Thursday at this point   Her ua did show wbc   Pain is still in the L groin - and it comes and goes and it is worse with movement   Patient Active Problem List   Diagnosis Date Noted  . Abdominal wall pain 01/06/2014  . Post herpetic neuralgia 10/16/2012  . Encounter for Medicare annual wellness exam 07/05/2012  . Risk for falls 07/05/2012  . Herpes zoster 06/28/2012  . Colon cancer H/O 04/07/2012  . Sacroiliac joint dysfunction 04/01/2012  . Sciatica 03/31/2012  . Low back pain 04/02/2011  . Frequent UTI 11/30/2010  . Other screening mammogram 09/30/2010  . DM2 (diabetes mellitus, type 2) 09/30/2010  . Post-menopausal 09/30/2010  . ABDOMINAL WALL HERNIA 02/26/2010  . Anemia 08/18/2009  . OBESITY 10/31/2007  . HYPERTENSION, ESSENTIAL NOS 10/31/2007  . HYPERLIPIDEMIA 07/26/2006  . HEMORRHOIDS 07/26/2006  . OSTEOARTHRITIS 07/26/2006  . Osteopenia 07/26/2006  . SLEEP DISORDER 07/26/2006   Past Medical History  Diagnosis Date  . Diabetes mellitus   . Hyperlipidemia   . Osteoarthritis   . Osteoporosis   . Hypertension   . Obesity   . Upper GI bleed     AV malformation/when anticoag  . Sacroiliac joint dysfunction 04/01/2012   Past Surgical History  Procedure Laterality Date  . Appendectomy    . Cholecystectomy    . Colon resection   1988    secondary to cancer  . Polypectomy  1998    benign X 2  . Cervicitis  1964    conization   . Total knee arthroplasty  07/2009    right   . Endometrial polyp  05/2000    hyperplasia-laser treatment  . Joint replacement      Rt knee replacement   History  Substance Use Topics  . Smoking status: Former Smoker    Types: Cigarettes    Quit date: 08/14/1962  . Smokeless tobacco: Never Used     Comment: Quit over 10 years ago  . Alcohol Use: No   Family History  Problem Relation Age of Onset  . Stroke Father   . Cancer Brother     lung   Allergies  Allergen Reactions  . Diclofenac Sodium Other (See Comments)    : vomiting  . Fluoxetine Hcl Other (See Comments)     vomiting  . Shellfish Allergy Nausea And Vomiting    Just feels "sick'   Current Outpatient Prescriptions on File Prior to Visit  Medication Sig Dispense Refill  . amLODipine (NORVASC) 5 MG tablet Take 5 mg by mouth daily.      Marland Kitchen aspirin 325 MG tablet Take 325 mg by mouth daily.      Marland Kitchen atorvastatin (  LIPITOR) 10 MG tablet Take 10 mg by mouth daily.      . calcium carbonate (OS-CAL) 600 MG TABS tablet Take 600 mg by mouth 2 (two) times daily with a meal.      . gabapentin (NEURONTIN) 300 MG capsule Take 600 mg by mouth 3 (three) times daily.      Marland Kitchen imipramine (TOFRANIL) 50 MG tablet Take 100 mg by mouth at bedtime.      . metFORMIN (GLUCOPHAGE) 500 MG tablet Take 500 mg by mouth 2 (two) times daily with a meal.      . metoprolol (LOPRESSOR) 50 MG tablet Take 100 mg by mouth 2 (two) times daily.      . Multiple Vitamins-Minerals (MULTIVITAMIN WITH MINERALS) tablet Take 1 tablet by mouth daily.      Marland Kitchen omeprazole (PRILOSEC) 20 MG capsule Take 20 mg by mouth daily.      . sennosides-docusate sodium (SENOKOT-S) 8.6-50 MG tablet Take 2 tablets by mouth 2 (two) times daily.      . vitamin C (ASCORBIC ACID) 500 MG tablet Take 500 mg by mouth daily.       No current facility-administered medications on file prior to  visit.      Review of Systems Review of Systems  Constitutional: Negative for fever, appetite change,  and unexpected weight change. pos for fatigue and malaise  Eyes: Negative for pain and visual disturbance.  Respiratory: Negative for cough and shortness of breath.   Cardiovascular: Negative for cp or palpitations    Gastrointestinal: pos for n/v and constipation / neg for blood in stool/ dark stool  Genitourinary: Negative for urgency and pos for frequency, neg for hematuria or dysuria  Skin: Negative for pallor or rash  MSK pos for pain in L groin area with pain to move her leg   Neurological: Negative for weakness, numbness and headaches. pos for high fall risk  Hematological: Negative for adenopathy. Does not bruise/bleed easily.  Psychiatric/Behavioral: Negative for dysphoric mood. The patient is not nervous/anxious.         Objective:   Physical Exam  Constitutional: She appears well-developed and well-nourished. No distress.  Frail appearing elderly female in a wheelchair    HENT:  Head: Normocephalic and atraumatic.  Mouth/Throat: Oropharynx is clear and moist.  Clear throat   Eyes: Conjunctivae and EOM are normal. Pupils are equal, round, and reactive to light. Right eye exhibits no discharge. Left eye exhibits no discharge. No scleral icterus.  Neck: Normal range of motion. Neck supple. No JVD present. Carotid bruit is not present. No thyromegaly present.  Cardiovascular: Normal rate, regular rhythm, normal heart sounds and intact distal pulses.   Pulmonary/Chest: Effort normal and breath sounds normal. No respiratory distress. She has no wheezes. She has no rales.  Abdominal: Soft. Bowel sounds are normal. She exhibits no distension and no mass. There is no tenderness. There is no rebound and no guarding.  Musculoskeletal: She exhibits tenderness. She exhibits no edema.  Pain in L groin to flex hip and externally rotate it (while sitting) No LS tenderness   No  groin/pelvic tenderness or swelling   Lymphadenopathy:    She has no cervical adenopathy.  Neurological: She is alert. She has normal reflexes. No cranial nerve deficit. She exhibits normal muscle tone. Coordination normal.  Skin: Skin is warm and dry. No rash noted. No erythema. No pallor.  Psychiatric:  Pt seems mentally fuzzy but is easy to engage in conversation and she answers questions appropriately  Assessment & Plan:   Problem List Items Addressed This Visit     Digestive   Nausea with vomiting     Likely multifactorial- from hydrocodone and constipation and poss uti  Given zofran 4 mg to use with caution  Disc imp of hydration       Genitourinary   UTI (urinary tract infection) - Primary     Looked at her ua from hosp-had many wbc  Will tx with keflex and send for cx     Relevant Medications      cephALEXin (KEFLEX) capsule   Other Relevant Orders      Urine culture     Other   Left groin pain     Xray of hip today Reviewed her CT  Likely strain from her fall She may end up needing some PT     Relevant Orders      DG Hip Complete Left (Completed)

## 2014-01-07 NOTE — Telephone Encounter (Signed)
Pt niece [name] called stating that medication prescribed Friday is making pt really nauseas and sick. Pt threw up last night. Pt niece is asking for an extra strength Tylenol instead of hydrocodone. Please advise  [place]

## 2014-01-07 NOTE — Progress Notes (Signed)
Pre visit review using our clinic review tool, if applicable. No additional management support is needed unless otherwise documented below in the visit note. 

## 2014-01-07 NOTE — Patient Instructions (Signed)
Make sure to sip/drink fluids Try zofran for nausea  Take meds with applesauce  Keflex for uti as directed  Xray of hip today -we will call with a result Try to give a urine sample on the way out for culture

## 2014-01-09 NOTE — Addendum Note (Signed)
Addended by: Ellamae Sia on: 01/09/2014 06:56 PM   Modules accepted: Orders

## 2014-01-15 ENCOUNTER — Telehealth: Payer: Self-pay | Admitting: Family Medicine

## 2014-01-15 DIAGNOSIS — E785 Hyperlipidemia, unspecified: Secondary | ICD-10-CM

## 2014-01-15 DIAGNOSIS — I10 Essential (primary) hypertension: Secondary | ICD-10-CM

## 2014-01-15 DIAGNOSIS — E119 Type 2 diabetes mellitus without complications: Secondary | ICD-10-CM

## 2014-01-15 NOTE — Telephone Encounter (Signed)
Message copied by Abner Greenspan on Tue Jan 15, 2014  8:31 AM ------      Message from: Ellamae Sia      Created: Thu Jan 10, 2014 11:17 AM      Regarding: Lab orders for Wednesday, 10.7.15       Lab orders for a 6 month f/u ------

## 2014-01-16 ENCOUNTER — Other Ambulatory Visit: Payer: BC Managed Care – PPO

## 2014-01-17 ENCOUNTER — Other Ambulatory Visit (INDEPENDENT_AMBULATORY_CARE_PROVIDER_SITE_OTHER): Payer: Medicare Other

## 2014-01-17 DIAGNOSIS — E119 Type 2 diabetes mellitus without complications: Secondary | ICD-10-CM | POA: Diagnosis not present

## 2014-01-17 DIAGNOSIS — E785 Hyperlipidemia, unspecified: Secondary | ICD-10-CM

## 2014-01-17 DIAGNOSIS — I10 Essential (primary) hypertension: Secondary | ICD-10-CM | POA: Diagnosis not present

## 2014-01-17 LAB — BASIC METABOLIC PANEL
BUN: 18 mg/dL (ref 6–23)
CHLORIDE: 105 meq/L (ref 96–112)
CO2: 25 mEq/L (ref 19–32)
CREATININE: 1.3 mg/dL — AB (ref 0.4–1.2)
Calcium: 9.8 mg/dL (ref 8.4–10.5)
GFR: 51.15 mL/min — ABNORMAL LOW (ref >60.00)
Glucose, Bld: 99 mg/dL (ref 70–99)
POTASSIUM: 4.5 meq/L (ref 3.5–5.1)
Sodium: 137 mEq/L (ref 135–145)

## 2014-01-17 LAB — HEMOGLOBIN A1C: Hgb A1c MFr Bld: 6.7 % — ABNORMAL HIGH (ref 4.6–6.5)

## 2014-01-17 LAB — LIPID PANEL
CHOLESTEROL: 125 mg/dL (ref 0–200)
HDL: 32.2 mg/dL — ABNORMAL LOW (ref >39.00)
NonHDL: 92.8
Total CHOL/HDL Ratio: 4
Triglycerides: 219 mg/dL — ABNORMAL HIGH (ref 0.0–149.0)
VLDL: 43.8 mg/dL — ABNORMAL HIGH (ref 0.0–40.0)

## 2014-01-17 LAB — LDL CHOLESTEROL, DIRECT: Direct LDL: 34.9 mg/dL

## 2014-01-22 ENCOUNTER — Other Ambulatory Visit: Payer: Self-pay | Admitting: Family Medicine

## 2014-01-23 ENCOUNTER — Ambulatory Visit (INDEPENDENT_AMBULATORY_CARE_PROVIDER_SITE_OTHER): Payer: Medicare Other | Admitting: Family Medicine

## 2014-01-23 ENCOUNTER — Encounter: Payer: Self-pay | Admitting: Family Medicine

## 2014-01-23 VITALS — BP 110/50 | HR 78 | Temp 97.5°F | Ht 70.5 in | Wt 228.2 lb

## 2014-01-23 DIAGNOSIS — I10 Essential (primary) hypertension: Secondary | ICD-10-CM | POA: Diagnosis not present

## 2014-01-23 DIAGNOSIS — Z23 Encounter for immunization: Secondary | ICD-10-CM | POA: Diagnosis not present

## 2014-01-23 DIAGNOSIS — E119 Type 2 diabetes mellitus without complications: Secondary | ICD-10-CM

## 2014-01-23 DIAGNOSIS — E785 Hyperlipidemia, unspecified: Secondary | ICD-10-CM

## 2014-01-23 DIAGNOSIS — R7989 Other specified abnormal findings of blood chemistry: Secondary | ICD-10-CM

## 2014-01-23 DIAGNOSIS — R748 Abnormal levels of other serum enzymes: Secondary | ICD-10-CM

## 2014-01-23 LAB — POCT URINALYSIS DIPSTICK
BILIRUBIN UA: NEGATIVE
GLUCOSE UA: NEGATIVE
KETONES UA: NEGATIVE
Leukocytes, UA: NEGATIVE
Nitrite, UA: NEGATIVE
RBC UA: NEGATIVE
Spec Grav, UA: 1.02
Urobilinogen, UA: 0.2
pH, UA: 6

## 2014-01-23 NOTE — Patient Instructions (Signed)
Labs are stable but your creatinine (kidney number) is up a bit  Try to drink more water  Please leave a urine specimen on the way out  No change in medicines  Keep moving -walk with your walker as much as you can  Let me know if your groin pain does not continue to improve  Flu shot today  Follow up in 6 months with labs prior for annual exam

## 2014-01-23 NOTE — Progress Notes (Signed)
Subjective:    Patient ID: Erin [name], female    DOB: 02/17/1929, 80 y.o.   MRN: [id number]  HPI Here for f/u of chronic medical problems   Still has pain in her groin since her fall  Her xray was normal -rev at last visit  She states it is much better than what it was -still not gone  Has been walking with her walker   Her shingles pain came back worse - ? If rash changed  Now not too bad  Still not wearing a bra    Wt is down 3 lb with bmi of 32 Is off hydrocodone  Bowels are moving fine -she does take miralax    Diabetes Home sugar results  DM diet - very good per pt - family says is fair (less appetite than she used to have) Exercise - tries to walk with walker  Symptoms -none  A1C last  Lab Results  Component Value Date   HGBA1C 6.7* 01/17/2014  this is down from 6.8  No problems with medications  Renal protection Last eye exam - was in early sept - no retinopathy   Flu shot-needs today    bp is stable today  No cp or palpitations or headaches or edema  No side effects to medicines  BP Readings from Last 3 Encounters:  01/23/14 110/50  01/07/14 142/70  01/04/14 136/78      Hyperlipidemia  Lab Results  Component Value Date   CHOL 125 01/17/2014   CHOL 144 07/16/2013   CHOL 158 06/28/2012   Lab Results  Component Value Date   HDL 32.20* 01/17/2014   HDL 44.90 07/16/2013   HDL 42.60 06/28/2012   Lab Results  Component Value Date   LDLCALC 53 07/16/2013   LDLCALC 71 01/07/2012   LDLCALC 62 03/29/2011   Lab Results  Component Value Date   TRIG 219.0* 01/17/2014   TRIG 229.0* 07/16/2013   TRIG 216.0* 06/28/2012   Lab Results  Component Value Date   CHOLHDL 4 01/17/2014   CHOLHDL 3 07/16/2013   CHOLHDL 4 06/28/2012   Lab Results  Component Value Date   LDLDIRECT 34.9 01/17/2014   LDLDIRECT 56.4 06/28/2012   LDLDIRECT 165.7 09/23/2010   not moving as much - HDL is down  Atorvastatin and diet   Cr is up  Lab Results  Component Value Date   CREATININE 1.3* 01/17/2014   BUN 18 01/17/2014   NA 137 01/17/2014   K 4.5 01/17/2014   CL 105 01/17/2014   CO2 25 01/17/2014   tx a uti at last visit with keflex  No urinary symptoms since then   ? If she drinks enough water  Will make an effort   Patient Active Problem List   Diagnosis Date Noted  . Elevated serum creatinine 01/24/2014  . Nausea with vomiting 01/07/2014  . Left groin pain 01/07/2014  . Post herpetic neuralgia 10/16/2012  . Encounter for Medicare annual wellness exam 07/05/2012  . Risk for falls 07/05/2012  . Herpes zoster 06/28/2012  . Colon cancer H/O 04/07/2012  . Sacroiliac joint dysfunction 04/01/2012  . Sciatica 03/31/2012  . Low back pain 04/02/2011  . Frequent UTI 11/30/2010  . Other screening mammogram 09/30/2010  . DM2 (diabetes mellitus, type 2) 09/30/2010  . Post-menopausal 09/30/2010  . ABDOMINAL WALL HERNIA 02/26/2010  . Anemia 08/18/2009  . OBESITY 10/31/2007  . Essential hypertension 10/31/2007  . Hyperlipidemia LDL goal <100 07/26/2006  . HEMORRHOIDS 07/26/2006  .  OSTEOARTHRITIS 07/26/2006  . Osteopenia 07/26/2006  . SLEEP DISORDER 07/26/2006   Past Medical History  Diagnosis Date  . Diabetes mellitus   . Hyperlipidemia   . Osteoarthritis   . Osteoporosis   . Hypertension   . Obesity   . Upper GI bleed     AV malformation/when anticoag  . Sacroiliac joint dysfunction 04/01/2012   Past Surgical History  Procedure Laterality Date  . Appendectomy    . Cholecystectomy    . Colon resection  1988    secondary to cancer  . Polypectomy  1998    benign X 2  . Cervicitis  1964    conization   . Total knee arthroplasty  07/2009    right   . Endometrial polyp  05/2000    hyperplasia-laser treatment  . Joint replacement      Rt knee replacement   History  Substance Use Topics  . Smoking status: Former Smoker    Types: Cigarettes    Quit date: 08/14/1962  . Smokeless tobacco: Never Used     Comment: Quit over 10 years ago  .  Alcohol Use: No   Family History  Problem Relation Age of Onset  . Stroke Father   . Cancer Brother     lung   Allergies  Allergen Reactions  . Diclofenac Sodium Other (See Comments)    : vomiting  . Fluoxetine Hcl Other (See Comments)     vomiting  . Hydrocodone     Nausea and vomiting   . Shellfish Allergy Nausea And Vomiting    Just feels "sick'   Current Outpatient Prescriptions on File Prior to Visit  Medication Sig Dispense Refill  . amLODipine (NORVASC) 5 MG tablet Take 5 mg by mouth daily.      Marland Kitchen aspirin 325 MG tablet Take 325 mg by mouth daily.      Marland Kitchen atorvastatin (LIPITOR) 10 MG tablet Take 10 mg by mouth daily.      . calcium carbonate (OS-CAL) 600 MG TABS tablet Take 600 mg by mouth 2 (two) times daily with a meal.      . gabapentin (NEURONTIN) 300 MG capsule Take 600 mg by mouth 3 (three) times daily.      Marland Kitchen imipramine (TOFRANIL) 50 MG tablet Take 100 mg by mouth at bedtime.      . metFORMIN (GLUCOPHAGE) 500 MG tablet TAKE 1 TABLET BY MOUTH TWICE A DAY WITH A MEAL  60 tablet  5  . metoprolol (LOPRESSOR) 50 MG tablet Take 100 mg by mouth 2 (two) times daily.      . Multiple Vitamins-Minerals (MULTIVITAMIN WITH MINERALS) tablet Take 1 tablet by mouth daily.      Marland Kitchen omeprazole (PRILOSEC) 20 MG capsule TAKE ONE (1) CAPSULE BY MOUTH EACH DAY  30 capsule  5  . sennosides-docusate sodium (SENOKOT-S) 8.6-50 MG tablet Take 2 tablets by mouth 2 (two) times daily.      . vitamin C (ASCORBIC ACID) 500 MG tablet Take 500 mg by mouth daily.       No current facility-administered medications on file prior to visit.    Review of Systems Review of Systems  Constitutional: Negative for fever, appetite change, fatigue and unexpected weight change.  Eyes: Negative for pain and visual disturbance.  Respiratory: Negative for cough and shortness of breath.   Cardiovascular: Negative for cp or palpitations    Gastrointestinal: Negative for nausea, diarrhea and constipation.    Genitourinary: Negative for urgency and frequency.  Skin:  Negative for pallor or rash  pos for ongoing shingles pain/post herpetic neuralgia  MSK pos for L groin pain that is much improved  Neurological: Negative for weakness, light-headedness, numbness and headaches.  Hematological: Negative for adenopathy. Does not bruise/bleed easily.  Psychiatric/Behavioral: Negative for dysphoric mood. The patient is not nervous/anxious.         Objective:   Physical Exam  Constitutional: She appears well-developed and well-nourished. No distress.  obese and well appearing  Ambulating with walker   HENT:  Head: Normocephalic and atraumatic.  Mouth/Throat: Oropharynx is clear and moist.  Eyes: Conjunctivae and EOM are normal. Pupils are equal, round, and reactive to light. No scleral icterus.  Neck: Normal range of motion. Neck supple. No JVD present. Carotid bruit is not present. No thyromegaly present.  Cardiovascular: Normal rate, regular rhythm, normal heart sounds and intact distal pulses.  Exam reveals no gallop.   Pulmonary/Chest: Effort normal and breath sounds normal. No respiratory distress. She has no wheezes. She has no rales.  Abdominal: Soft. Bowel sounds are normal. She exhibits no distension and no mass. There is no tenderness.  Musculoskeletal: She exhibits tenderness. She exhibits no edema.  Tenderness in L groin area -overall improved   Tenderness over area of prev shingles on L chest and back   Lymphadenopathy:    She has no cervical adenopathy.  Neurological: She is alert. She has normal reflexes. No cranial nerve deficit. She exhibits normal muscle tone. Coordination normal.  Skin: Skin is warm and dry. No rash noted. No erythema. No pallor.  Psychiatric: She has a normal mood and affect.          Assessment & Plan:   Problem List Items Addressed This Visit     Cardiovascular and Mediastinum   Essential hypertension - Primary      bp in fair control at this time   BP Readings from Last 1 Encounters:  01/23/14 110/50   No changes needed Disc lifstyle change with low sodium diet and exercise  Labs reviewed  F/u 6 mo for annual exam       Endocrine   DM2 (diabetes mellitus, type 2)      Lab Results  Component Value Date   HGBA1C 6.7* 01/17/2014   Overall stable Rev low glycemic diet Needs to inc exercise - disc use of walker and consider exercise bike for home  F/u 6 mo     Relevant Orders      Microalbumin / creatinine urine ratio      POCT urinalysis dipstick (Completed)     Other   Hyperlipidemia LDL goal <100     Disc goals for lipids and reasons to control them Rev labs with pt Rev low sat fat diet in detail Trig high/ HDL low Disc inc activity as tolerated      Elevated serum creatinine     Disc water intake- may need to drink more  ua clear -no infection  Sent for microalbumin Will need to watch  No symptoms      Other Visit Diagnoses   Need for prophylactic vaccination and inoculation against influenza        Relevant Orders       Flu Vaccine QUAD 36+ mos IM (Completed)

## 2014-01-23 NOTE — Progress Notes (Signed)
Pre visit review using our clinic review tool, if applicable. No additional management support is needed unless otherwise documented below in the visit note. 

## 2014-01-24 DIAGNOSIS — R7989 Other specified abnormal findings of blood chemistry: Secondary | ICD-10-CM | POA: Insufficient documentation

## 2014-01-24 LAB — MICROALBUMIN / CREATININE URINE RATIO
CREATININE, U: 199.3 mg/dL
Microalb Creat Ratio: 0.9 mg/g (ref 0.0–30.0)
Microalb, Ur: 1.7 mg/dL (ref 0.0–1.9)

## 2014-01-24 NOTE — Assessment & Plan Note (Signed)
Disc goals for lipids and reasons to control them Rev labs with pt Rev low sat fat diet in detail Trig high/ HDL low Disc inc activity as tolerated

## 2014-01-24 NOTE — Assessment & Plan Note (Signed)
Lab Results  Component Value Date   HGBA1C 6.7* 01/17/2014   Overall stable Rev low glycemic diet Needs to inc exercise - disc use of walker and consider exercise bike for home  F/u 6 mo

## 2014-01-24 NOTE — Assessment & Plan Note (Signed)
bp in fair control at this time  BP Readings from Last 1 Encounters:  01/23/14 110/50   No changes needed Disc lifstyle change with low sodium diet and exercise  Labs reviewed  F/u 6 mo for annual exam

## 2014-01-24 NOTE — Assessment & Plan Note (Signed)
Disc water intake- may need to drink more  ua clear -no infection  Sent for microalbumin Will need to watch  No symptoms

## 2014-04-02 ENCOUNTER — Other Ambulatory Visit: Payer: Self-pay | Admitting: Family Medicine

## 2014-04-22 ENCOUNTER — Other Ambulatory Visit: Payer: Self-pay | Admitting: Family Medicine

## 2014-04-22 DIAGNOSIS — B351 Tinea unguium: Secondary | ICD-10-CM | POA: Diagnosis not present

## 2014-04-22 DIAGNOSIS — M79674 Pain in right toe(s): Secondary | ICD-10-CM | POA: Diagnosis not present

## 2014-04-22 DIAGNOSIS — M79675 Pain in left toe(s): Secondary | ICD-10-CM | POA: Diagnosis not present

## 2014-06-03 ENCOUNTER — Other Ambulatory Visit: Payer: Self-pay | Admitting: Family Medicine

## 2014-07-07 ENCOUNTER — Other Ambulatory Visit: Payer: Self-pay | Admitting: Family Medicine

## 2014-07-18 ENCOUNTER — Telehealth: Payer: Self-pay | Admitting: Family Medicine

## 2014-07-18 DIAGNOSIS — E785 Hyperlipidemia, unspecified: Secondary | ICD-10-CM

## 2014-07-18 DIAGNOSIS — M8580 Other specified disorders of bone density and structure, unspecified site: Secondary | ICD-10-CM

## 2014-07-18 DIAGNOSIS — I10 Essential (primary) hypertension: Secondary | ICD-10-CM

## 2014-07-18 DIAGNOSIS — E119 Type 2 diabetes mellitus without complications: Secondary | ICD-10-CM

## 2014-07-18 NOTE — Telephone Encounter (Signed)
-----   Message from Ellamae Sia sent at 07/16/2014  3:21 PM EDT ----- Regarding: Lab orders for Friday, 4.8.16 Patient is scheduled for CPX labs, please order future labs, Thanks , Karna Christmas

## 2014-07-19 ENCOUNTER — Other Ambulatory Visit (INDEPENDENT_AMBULATORY_CARE_PROVIDER_SITE_OTHER): Payer: Medicare Other

## 2014-07-19 DIAGNOSIS — M859 Disorder of bone density and structure, unspecified: Secondary | ICD-10-CM | POA: Diagnosis not present

## 2014-07-19 DIAGNOSIS — E785 Hyperlipidemia, unspecified: Secondary | ICD-10-CM | POA: Diagnosis not present

## 2014-07-19 DIAGNOSIS — R7989 Other specified abnormal findings of blood chemistry: Secondary | ICD-10-CM | POA: Diagnosis not present

## 2014-07-19 DIAGNOSIS — I10 Essential (primary) hypertension: Secondary | ICD-10-CM | POA: Diagnosis not present

## 2014-07-19 DIAGNOSIS — M8580 Other specified disorders of bone density and structure, unspecified site: Secondary | ICD-10-CM

## 2014-07-19 DIAGNOSIS — E119 Type 2 diabetes mellitus without complications: Secondary | ICD-10-CM | POA: Diagnosis not present

## 2014-07-19 LAB — CBC WITH DIFFERENTIAL/PLATELET
Basophils Absolute: 0 10*3/uL (ref 0.0–0.1)
Basophils Relative: 0.2 % (ref 0.0–3.0)
Eosinophils Absolute: 0 10*3/uL (ref 0.0–0.7)
Eosinophils Relative: 0.2 % (ref 0.0–5.0)
HCT: 36.9 % (ref 36.0–46.0)
Hemoglobin: 12.2 g/dL (ref 12.0–15.0)
LYMPHS PCT: 34.8 % (ref 12.0–46.0)
Lymphs Abs: 3 10*3/uL (ref 0.7–4.0)
MCHC: 33.2 g/dL (ref 30.0–36.0)
MCV: 84.3 fl (ref 78.0–100.0)
MONOS PCT: 9 % (ref 3.0–12.0)
Monocytes Absolute: 0.8 10*3/uL (ref 0.1–1.0)
Neutro Abs: 4.8 10*3/uL (ref 1.4–7.7)
Neutrophils Relative %: 55.8 % (ref 43.0–77.0)
PLATELETS: 188 10*3/uL (ref 150.0–400.0)
RBC: 4.38 Mil/uL (ref 3.87–5.11)
RDW: 16.1 % — AB (ref 11.5–15.5)
WBC: 8.7 10*3/uL (ref 4.0–10.5)

## 2014-07-19 LAB — COMPREHENSIVE METABOLIC PANEL
ALT: 14 U/L (ref 0–35)
AST: 22 U/L (ref 0–37)
Albumin: 4 g/dL (ref 3.5–5.2)
Alkaline Phosphatase: 74 U/L (ref 39–117)
BUN: 18 mg/dL (ref 6–23)
CALCIUM: 10.3 mg/dL (ref 8.4–10.5)
CHLORIDE: 104 meq/L (ref 96–112)
CO2: 25 meq/L (ref 19–32)
Creatinine, Ser: 1.32 mg/dL — ABNORMAL HIGH (ref 0.40–1.20)
GFR: 49.31 mL/min — ABNORMAL LOW (ref >60.00)
Glucose, Bld: 112 mg/dL — ABNORMAL HIGH (ref 70–99)
Potassium: 4.1 mEq/L (ref 3.5–5.1)
SODIUM: 137 meq/L (ref 135–145)
Total Bilirubin: 0.4 mg/dL (ref 0.2–1.2)
Total Protein: 8.1 g/dL (ref 6.0–8.3)

## 2014-07-19 LAB — LIPID PANEL
CHOL/HDL RATIO: 3
Cholesterol: 130 mg/dL (ref 0–200)
HDL: 44.4 mg/dL (ref >39.00)
NonHDL: 85.6
Triglycerides: 205 mg/dL — ABNORMAL HIGH (ref 0.0–149.0)
VLDL: 41 mg/dL — AB (ref 0.0–40.0)

## 2014-07-19 LAB — HEMOGLOBIN A1C: Hgb A1c MFr Bld: 6.6 % — ABNORMAL HIGH (ref 4.6–6.5)

## 2014-07-19 LAB — LDL CHOLESTEROL, DIRECT: Direct LDL: 38 mg/dL

## 2014-07-19 LAB — VITAMIN D 25 HYDROXY (VIT D DEFICIENCY, FRACTURES): VITD: 42.49 ng/mL (ref 30.00–100.00)

## 2014-07-19 LAB — TSH: TSH: 5.3 u[IU]/mL — ABNORMAL HIGH (ref 0.35–4.50)

## 2014-07-26 ENCOUNTER — Ambulatory Visit (INDEPENDENT_AMBULATORY_CARE_PROVIDER_SITE_OTHER): Payer: Medicare Other | Admitting: Family Medicine

## 2014-07-26 ENCOUNTER — Encounter: Payer: Self-pay | Admitting: Family Medicine

## 2014-07-26 VITALS — BP 120/68 | HR 69 | Temp 98.1°F | Ht 67.5 in | Wt 237.4 lb

## 2014-07-26 DIAGNOSIS — I10 Essential (primary) hypertension: Secondary | ICD-10-CM

## 2014-07-26 DIAGNOSIS — M8580 Other specified disorders of bone density and structure, unspecified site: Secondary | ICD-10-CM

## 2014-07-26 DIAGNOSIS — R7989 Other specified abnormal findings of blood chemistry: Secondary | ICD-10-CM | POA: Insufficient documentation

## 2014-07-26 DIAGNOSIS — R946 Abnormal results of thyroid function studies: Secondary | ICD-10-CM

## 2014-07-26 DIAGNOSIS — Z0000 Encounter for general adult medical examination without abnormal findings: Secondary | ICD-10-CM

## 2014-07-26 DIAGNOSIS — E669 Obesity, unspecified: Secondary | ICD-10-CM

## 2014-07-26 DIAGNOSIS — E119 Type 2 diabetes mellitus without complications: Secondary | ICD-10-CM

## 2014-07-26 DIAGNOSIS — Z23 Encounter for immunization: Secondary | ICD-10-CM

## 2014-07-26 DIAGNOSIS — Z1211 Encounter for screening for malignant neoplasm of colon: Secondary | ICD-10-CM | POA: Diagnosis not present

## 2014-07-26 DIAGNOSIS — B0229 Other postherpetic nervous system involvement: Secondary | ICD-10-CM

## 2014-07-26 NOTE — Progress Notes (Signed)
Subjective:    Patient ID: Erin [name], female    DOB: March 13, 1929, 82 y.o.   MRN: [id number]  HPI Here for annual medicare wellness visit as well as chronic/acute medical problems   I have personally reviewed the Medicare Annual Wellness questionnaire and have noted 1. The patient's medical and social history 2. Their use of alcohol, tobacco or illicit drugs 3. Their current medications and supplements 4. The patient's functional ability including ADL's, fall risks, home safety risks and hearing or visual             impairment. 5. Diet and physical activities 6. Evidence for depression or mood disorders  The patients weight, height, BMI have been recorded in the chart and visual acuity is per eye clinic.  I have made referrals, counseling and provided education to the patient based review of the above and I have provided the pt with a written personalized care plan for preventive services.    See scanned forms.  Routine anticipatory guidance given to patient.  See health maintenance. Colon cancer screening colonosc 2010 - with personal hx of colon cancer - no recall due to age - will do ifob cards  Breast cancer screening 8/14 - in [place] , had shingles in breast area since then - still sensitive and does not think she could tolerate it  Self breast exam-no lumps  Flu vaccine 10/15 Tetanus vaccine 12/08 Pneumovax 12/08 , will do prevnar today  Zoster vaccine- had shingles already - and ins does not cover vaccine  Osteopenia 4/15 - has lost hight , taking her ca and D (D level is 42) , no fractures , no falls but has slid off the edge of the bed  Advance directive -has that drawn up  Cognitive function addressed- see scanned forms- and if abnormal then additional documentation follows.  Overall memory is pretty good , occ takes longer to think about words or names   PMH and SH reviewed  Meds, vitals, and allergies reviewed.   ROS: See HPI.  Otherwise negative.    Renal  insuff Lab Results  Component Value Date   CREATININE 1.32* 07/19/2014   BUN 18 07/19/2014   NA 137 07/19/2014   K 4.1 07/19/2014   CL 104 07/19/2014   CO2 25 07/19/2014  is working on keeping track of water - does not get as thirsty  No nsaids   This is stable   Hyperlipidemia lipitor and diet Lab Results  Component Value Date   CHOL 130 07/19/2014   CHOL 125 01/17/2014   CHOL 144 07/16/2013   Lab Results  Component Value Date   HDL 44.40 07/19/2014   HDL 32.20* 01/17/2014   HDL 44.90 07/16/2013   Lab Results  Component Value Date   LDLCALC 53 07/16/2013   LDLCALC 71 01/07/2012   LDLCALC 62 03/29/2011   Lab Results  Component Value Date   TRIG 205.0* 07/19/2014   TRIG 219.0* 01/17/2014   TRIG 229.0* 07/16/2013   Lab Results  Component Value Date   CHOLHDL 3 07/19/2014   CHOLHDL 4 01/17/2014   CHOLHDL 3 07/16/2013   Lab Results  Component Value Date   LDLDIRECT 38.0 07/19/2014   LDLDIRECT 34.9 01/17/2014   LDLDIRECT 56.4 06/28/2012  overall stable     DM2 a1C is 6.6 down from 6.7 On metformin  Eye exam 9/15   tsh is slt elevated  Lab Results  Component Value Date   TSH 5.30* 07/19/2014   no symptoms  at all  Osteopenia dexa 4/15 D level 42 FN BMD went down 2 %  Had several episodes - pain under L ear which caused a fleeting head pressure  No neuro symptoms -speech fine/no facial droop /no numbness or weakness  Feels fine now  No symptoms for a few weeks   Lab Results  Component Value Date   WBC 8.7 07/19/2014   HGB 12.2 07/19/2014   HCT 36.9 07/19/2014   MCV 84.3 07/19/2014   PLT 188.0 07/19/2014      Patient Active Problem List   Diagnosis Date Noted  . Colon cancer screening 07/26/2014  . Elevated TSH 07/26/2014  . Elevated serum creatinine 01/24/2014  . Nausea with vomiting 01/07/2014  . Left groin pain 01/07/2014  . Post herpetic neuralgia 10/16/2012  . Encounter for Medicare annual wellness exam 07/05/2012  . Risk for  falls 07/05/2012  . Colon cancer H/O 04/07/2012  . Sacroiliac joint dysfunction 04/01/2012  . Sciatica 03/31/2012  . Low back pain 04/02/2011  . Frequent UTI 11/30/2010  . Other screening mammogram 09/30/2010  . DM2 (diabetes mellitus, type 2) 09/30/2010  . Post-menopausal 09/30/2010  . ABDOMINAL WALL HERNIA 02/26/2010  . Anemia 08/18/2009  . Obesity 10/31/2007  . Essential hypertension 10/31/2007  . Hyperlipidemia LDL goal <100 07/26/2006  . HEMORRHOIDS 07/26/2006  . OSTEOARTHRITIS 07/26/2006  . Osteopenia 07/26/2006  . SLEEP DISORDER 07/26/2006   Past Medical History  Diagnosis Date  . Diabetes mellitus   . Hyperlipidemia   . Osteoarthritis   . Osteoporosis   . Hypertension   . Obesity   . Upper GI bleed     AV malformation/when anticoag  . Sacroiliac joint dysfunction 04/01/2012   Past Surgical History  Procedure Laterality Date  . Appendectomy    . Cholecystectomy    . Colon resection  1988    secondary to cancer  . Polypectomy  1998    benign X 2  . Cervicitis  1964    conization   . Total knee arthroplasty  07/2009    right   . Endometrial polyp  05/2000    hyperplasia-laser treatment  . Joint replacement      Rt knee replacement   History  Substance Use Topics  . Smoking status: Former Smoker    Types: Cigarettes    Quit date: 08/14/1962  . Smokeless tobacco: Never Used     Comment: Quit over 10 years ago  . Alcohol Use: No   Family History  Problem Relation Age of Onset  . Stroke Father   . Cancer Brother     lung   Allergies  Allergen Reactions  . Diclofenac Sodium Other (See Comments)    : vomiting  . Fluoxetine Hcl Other (See Comments)     vomiting  . Hydrocodone     Nausea and vomiting   . Shellfish Allergy Nausea And Vomiting    Just feels "sick'   Current Outpatient Prescriptions on File Prior to Visit  Medication Sig Dispense Refill  . acetaminophen (TYLENOL) 500 MG tablet Take 1,000 mg by mouth every 4 (four) hours as needed.     Marland Kitchen amLODipine (NORVASC) 5 MG tablet TAKE 1 TABLET BY MOUTH DAILY 30 tablet 5  . aspirin 325 MG tablet Take 325 mg by mouth daily.    Marland Kitchen atorvastatin (LIPITOR) 10 MG tablet TAKE 1 TABLET BY MOUTH DAILY 30 tablet 3  . calcium carbonate (OS-CAL) 600 MG TABS tablet Take 600 mg by mouth 2 (two) times daily  with a meal.    . gabapentin (NEURONTIN) 300 MG capsule Take 600 mg by mouth 3 (three) times daily.    Marland Kitchen gabapentin (NEURONTIN) 300 MG capsule TAKE 1 TO 2 CAPSULES BY MOUTH THREE TIMES PER DAY 180 capsule 3  . imipramine (TOFRANIL) 50 MG tablet Take 100 mg by mouth at bedtime.    . metFORMIN (GLUCOPHAGE) 500 MG tablet TAKE 1 TABLET BY MOUTH TWICE A DAY WITH A MEAL 60 tablet 5  . metoprolol (LOPRESSOR) 50 MG tablet Take 100 mg by mouth 2 (two) times daily.    . metoprolol (LOPRESSOR) 50 MG tablet TAKE TWO TABLETS BY MOUTH TWICE DAILY 120 tablet 3  . Multiple Vitamins-Minerals (MULTIVITAMIN WITH MINERALS) tablet Take 1 tablet by mouth daily.    Marland Kitchen omeprazole (PRILOSEC) 20 MG capsule TAKE ONE (1) CAPSULE BY MOUTH EACH DAY 30 capsule 5  . sennosides-docusate sodium (SENOKOT-S) 8.6-50 MG tablet Take 2 tablets by mouth 2 (two) times daily.    . vitamin C (ASCORBIC ACID) 500 MG tablet Take 500 mg by mouth daily.     No current facility-administered medications on file prior to visit.    Review of Systems Review of Systems  Constitutional: Negative for fever, appetite change, fatigue and unexpected weight change.  Eyes: Negative for pain and visual disturbance.  Respiratory: Negative for cough and shortness of breath.   Cardiovascular: Negative for cp or palpitations    Gastrointestinal: Negative for nausea, diarrhea and constipation.  Genitourinary: Negative for urgency and frequency.  Skin: Negative for pallor or rash  Pos for post herpetic neuralgia pain on L chest wall/breast  Neurological: Negative for weakness, light-headedness, numbness and headaches.  Hematological: Negative for adenopathy.  Does not bruise/bleed easily.  Psychiatric/Behavioral: Negative for dysphoric mood. The patient is not nervous/anxious.         Objective:   Physical Exam  Constitutional: She appears well-developed and well-nourished. No distress.  obese and well appearing (somewhat frail appearing however)   HENT:  Head: Normocephalic and atraumatic.  Right Ear: External ear normal.  Left Ear: External ear normal.  Mouth/Throat: Oropharynx is clear and moist.  Eyes: Conjunctivae and EOM are normal. Pupils are equal, round, and reactive to light. No scleral icterus.  Neck: Normal range of motion. Neck supple. No JVD present. Carotid bruit is not present. No thyromegaly present.  Cardiovascular: Normal rate, regular rhythm, normal heart sounds and intact distal pulses.  Exam reveals no gallop.   Pulmonary/Chest: Effort normal and breath sounds normal. No respiratory distress. She has no wheezes. She exhibits no tenderness.  Abdominal: Soft. Bowel sounds are normal. She exhibits no distension, no abdominal bruit and no mass. There is no tenderness.  Genitourinary: No breast swelling, tenderness, discharge or bleeding.  Breast exam: No mass, nodules, thickening, bulging, retraction, inflamation, nipple discharge or skin changes noted.  No axillary or clavicular LA.     Some baseline tenderness of skin on L due to post herpetic neuralgia   Musculoskeletal: Normal range of motion. She exhibits no edema or tenderness.  Lymphadenopathy:    She has no cervical adenopathy.  Neurological: She is alert. She has normal reflexes. No cranial nerve deficit. She exhibits normal muscle tone. Coordination normal.  Walks well with walker Some difficulty getting on exam table   Skin: Skin is warm and dry. No rash noted. No erythema. No pallor.  Improved area of hyperpigmentation on L chest wall/breast area   Psychiatric: She has a normal mood and affect.  Pleasant  Assessment & Plan:   Problem List  Items Addressed This Visit      Cardiovascular and Mediastinum   Essential hypertension    bp in fair control at this time  BP Readings from Last 1 Encounters:  07/26/14 120/68   No changes needed Disc lifstyle change with low sodium diet and exercise  Labs reviewed         Endocrine   DM2 (diabetes mellitus, type 2)    Lab Results  Component Value Date   HGBA1C 6.6* 07/19/2014   This is stable  Disc need for wt loss with low glycemic/fat diet and exercise as tol (walking with walker)  Annual eye exams Watching renal fxn        Nervous and Auditory   Post herpetic neuralgia    Very gradually improving in L breast area - but still too tender to tolerate a mammogram  Will continue to follow Helped by gabapentin  If a zoster vaccine becomes affordable - she will contact us to get it         Musculoskeletal and Integument   Osteopenia    Rev last dexa 2015 Does not desire tx at this time  Disc need for calcium/ vitamin D/ wt bearing exercise and bone density test every 2 y to monitor Disc safety/ fracture risk in detail   No fragility fractures         Other   Colon cancer screening - Primary    In pt with past hx of colon cancer herself  Last note from GI-no further colonoscopies recommended for screen due to age  Will have her do IFOB cards       Elevated TSH    Mild/asymptomatic Will re check this for next visit  Rev s/s of hypothyroidism and will be aware       Encounter for Medicare annual wellness exam    Reviewed health habits including diet and exercise and skin cancer prevention Reviewed appropriate screening tests for age  Also reviewed health mt list, fam hx and immunization status , as well as social and family history   See HPI Labs reviewed Please do stool cards for colon cancer screening  prevnar vaccine today If you are interested in a shingles/zoster vaccine - call your insurance to check on coverage,( you should not get it within 1  month of other vaccines) , then call us for a prescription  for it to take to a pharmacy that gives the shot , or make a nurse visit to get it here depending on your coverage      Obesity    Discussed how this problem influences overall health and the risks it imposes  Reviewed plan for weight loss with lower calorie diet (via better food choices and also portion control or program like weight watchers) and exercise building up to or more than 30 minutes 5 days per week including some aerobic activity   -exercise is limited -pt can take walks with walker however        Other Visit Diagnoses    Need for prophylactic vaccination against Streptococcus pneumoniae (pneumococcus)        Relevant Orders    Pneumococcal conjugate vaccine 13-valent IM (Completed)

## 2014-07-26 NOTE — Progress Notes (Signed)
Pre visit review using our clinic review tool, if applicable. No additional management support is needed unless otherwise documented below in the visit note. 

## 2014-07-26 NOTE — Patient Instructions (Addendum)
Please do stool cards for colon cancer screening  prevnar vaccine today If you are interested in a shingles/zoster vaccine - call your insurance to check on coverage,( you should not get it within 1 month of other vaccines) , then call us for a prescription  for it to take to a pharmacy that gives the shot , or make a nurse visit to get it here depending on your coverage  We will re check thyroid test at 6 months follow up Follow up in 6 months with labs prior

## 2014-07-28 NOTE — Assessment & Plan Note (Signed)
Lab Results  Component Value Date   HGBA1C 6.6* 07/19/2014   This is stable  Disc need for wt loss with low glycemic/fat diet and exercise as tol (walking with walker)  Annual eye exams Watching renal fxn

## 2014-07-28 NOTE — Assessment & Plan Note (Signed)
Mild/asymptomatic Will re check this for next visit  Rev s/s of hypothyroidism and will be aware

## 2014-07-28 NOTE — Assessment & Plan Note (Signed)
Reviewed health habits including diet and exercise and skin cancer prevention Reviewed appropriate screening tests for age  Also reviewed health mt list, fam hx and immunization status , as well as social and family history   See HPI Labs reviewed Please do stool cards for colon cancer screening  prevnar vaccine today If you are interested in a shingles/zoster vaccine - call your insurance to check on coverage,( you should not get it within 1 month of other vaccines) , then call us for a prescription  for it to take to a pharmacy that gives the shot , or make a nurse visit to get it here depending on your coverage

## 2014-07-28 NOTE — Assessment & Plan Note (Signed)
Very gradually improving in L breast area - but still too tender to tolerate a mammogram  Will continue to follow Helped by gabapentin  If a zoster vaccine becomes affordable - she will contact us to get it

## 2014-07-28 NOTE — Assessment & Plan Note (Signed)
In pt with past hx of colon cancer herself  Last note from GI-no further colonoscopies recommended for screen due to age  Will have her do IFOB cards

## 2014-07-28 NOTE — Assessment & Plan Note (Signed)
Discussed how this problem influences overall health and the risks it imposes  Reviewed plan for weight loss with lower calorie diet (via better food choices and also portion control or program like weight watchers) and exercise building up to or more than 30 minutes 5 days per week including some aerobic activity   -exercise is limited -pt can take walks with walker however

## 2014-07-28 NOTE — Assessment & Plan Note (Signed)
bp in fair control at this time  BP Readings from Last 1 Encounters:  07/26/14 120/68   No changes needed Disc lifstyle change with low sodium diet and exercise  Labs reviewed

## 2014-07-28 NOTE — Assessment & Plan Note (Signed)
Rev last dexa 2015 Does not desire tx at this time  Disc need for calcium/ vitamin D/ wt bearing exercise and bone density test every 2 y to monitor Disc safety/ fracture risk in detail   No fragility fractures

## 2014-08-19 ENCOUNTER — Telehealth: Payer: Self-pay | Admitting: Family Medicine

## 2014-08-19 NOTE — Telephone Encounter (Signed)
That sounds like some sort of pinched nerve (most likely in spine or neck) - follow up if symptoms continue  Since it is so brief I cannot recommend something to take for it

## 2014-08-19 NOTE — Telephone Encounter (Signed)
Left voicemail requesting [name] to call office back

## 2014-08-19 NOTE — Telephone Encounter (Signed)
[name] notified of Dr. Marliss Coots comments/recommendations and will have pt f/u with Dr. Glori Bickers if sxs return

## 2014-08-19 NOTE — Telephone Encounter (Signed)
[name] said pt is having these episode (not often) where she gets a sharpe pain that starts in her shoulder, then radiates to the back of neck, and up to head, pt said it's very brief but painful then she feels okay afterwards. This has happened once before her last appt.(07/26/14)  and now it's happened again. It happened this Saturday, [name] isn't sure if pt over did herself Saturday (long day), because it happened Saturday afternoon after she returned home. Pt has no other sxs. [name] is concerned and wants to know what Dr. Glori Bickers recommends.

## 2014-08-19 NOTE — Telephone Encounter (Signed)
Erin [name] called she has some concerns about ms [facility] and would like a call back  Concerning ms [facility] Sharpe pain through back of neck to head  Erin [name] refused [facility]

## 2014-09-09 ENCOUNTER — Other Ambulatory Visit: Payer: Self-pay | Admitting: Family Medicine

## 2014-09-12 ENCOUNTER — Telehealth: Payer: Self-pay | Admitting: Family Medicine

## 2014-09-12 NOTE — Telephone Encounter (Signed)
Pt dropped off packet of information. Please call if you have any questions.  Thanks

## 2014-09-13 NOTE — Telephone Encounter (Signed)
Medicare form in your inbox

## 2014-09-13 NOTE — Telephone Encounter (Signed)
Noted, thanks!

## 2014-10-09 DIAGNOSIS — M79675 Pain in left toe(s): Secondary | ICD-10-CM | POA: Diagnosis not present

## 2014-10-09 DIAGNOSIS — M79674 Pain in right toe(s): Secondary | ICD-10-CM | POA: Diagnosis not present

## 2014-10-09 DIAGNOSIS — B351 Tinea unguium: Secondary | ICD-10-CM | POA: Diagnosis not present

## 2014-10-10 ENCOUNTER — Other Ambulatory Visit: Payer: Self-pay | Admitting: Family Medicine

## 2014-10-24 ENCOUNTER — Other Ambulatory Visit: Payer: Self-pay | Admitting: Family Medicine

## 2014-10-28 ENCOUNTER — Other Ambulatory Visit: Payer: Self-pay | Admitting: Family Medicine

## 2014-10-28 NOTE — Telephone Encounter (Signed)
Pt left v/m checking on status of gabapentin refill to [facility]. rx last refilled # 180 x 3 on 06/04/14 and last annual exam 07/26/14.Please advise.

## 2014-10-28 NOTE — Telephone Encounter (Signed)
Please refill for a year  

## 2014-10-31 ENCOUNTER — Other Ambulatory Visit: Payer: Self-pay | Admitting: Family Medicine

## 2014-11-07 ENCOUNTER — Telehealth: Payer: Self-pay

## 2014-11-07 NOTE — Telephone Encounter (Signed)
Kelly pts niece left v/m pt is out of omeprazole for couple of days and having trouble eating now due to being out of med and wants to know if there is OTC pt can take. Unable to reach pt by phone; phone rings and no answer and no v/m. Spoke with [name] and she will assist pt in getting OTC omeprazole until PA can be done.

## 2014-11-07 NOTE — Telephone Encounter (Signed)
Pt left v/m requesting cb; had bad night last night. Request cb about med that ins will not pay for.(did not leave name of med.)

## 2014-11-08 NOTE — Telephone Encounter (Signed)
PA was done on 11/06/14 at [email or web address], I will check status of PA today

## 2014-11-08 NOTE — Telephone Encounter (Signed)
PA was approved Conf # [id number], Pharmacy and pt advised

## 2014-11-20 ENCOUNTER — Other Ambulatory Visit: Payer: Self-pay | Admitting: Family Medicine

## 2015-01-13 ENCOUNTER — Other Ambulatory Visit: Payer: Self-pay | Admitting: Family Medicine

## 2015-01-20 ENCOUNTER — Other Ambulatory Visit (INDEPENDENT_AMBULATORY_CARE_PROVIDER_SITE_OTHER): Payer: Medicare Other

## 2015-01-20 DIAGNOSIS — R946 Abnormal results of thyroid function studies: Secondary | ICD-10-CM | POA: Diagnosis not present

## 2015-01-20 DIAGNOSIS — E119 Type 2 diabetes mellitus without complications: Secondary | ICD-10-CM

## 2015-01-20 DIAGNOSIS — R7989 Other specified abnormal findings of blood chemistry: Secondary | ICD-10-CM

## 2015-01-20 LAB — LIPID PANEL
CHOL/HDL RATIO: 3
Cholesterol: 114 mg/dL (ref 0–200)
HDL: 38.6 mg/dL — AB (ref >39.00)
LDL Cholesterol: 48 mg/dL (ref 0–99)
NonHDL: 75.77
TRIGLYCERIDES: 138 mg/dL (ref 0.0–149.0)
VLDL: 27.6 mg/dL (ref 0.0–40.0)

## 2015-01-20 LAB — TSH: TSH: 3.92 u[IU]/mL (ref 0.35–4.50)

## 2015-01-20 LAB — HEMOGLOBIN A1C: Hgb A1c MFr Bld: 6.5 % (ref 4.6–6.5)

## 2015-01-21 DIAGNOSIS — I10 Essential (primary) hypertension: Secondary | ICD-10-CM | POA: Diagnosis not present

## 2015-01-21 DIAGNOSIS — H35033 Hypertensive retinopathy, bilateral: Secondary | ICD-10-CM | POA: Diagnosis not present

## 2015-01-21 DIAGNOSIS — H5203 Hypermetropia, bilateral: Secondary | ICD-10-CM | POA: Diagnosis not present

## 2015-01-21 DIAGNOSIS — H52223 Regular astigmatism, bilateral: Secondary | ICD-10-CM | POA: Diagnosis not present

## 2015-01-21 LAB — HM DIABETES EYE EXAM

## 2015-01-27 ENCOUNTER — Ambulatory Visit (INDEPENDENT_AMBULATORY_CARE_PROVIDER_SITE_OTHER): Payer: Medicare Other | Admitting: Family Medicine

## 2015-01-27 ENCOUNTER — Encounter: Payer: Self-pay | Admitting: Family Medicine

## 2015-01-27 VITALS — BP 122/64 | HR 82 | Temp 98.3°F | Ht 67.5 in | Wt 224.5 lb

## 2015-01-27 DIAGNOSIS — Z23 Encounter for immunization: Secondary | ICD-10-CM

## 2015-01-27 DIAGNOSIS — E785 Hyperlipidemia, unspecified: Secondary | ICD-10-CM

## 2015-01-27 DIAGNOSIS — I10 Essential (primary) hypertension: Secondary | ICD-10-CM | POA: Diagnosis not present

## 2015-01-27 DIAGNOSIS — E119 Type 2 diabetes mellitus without complications: Secondary | ICD-10-CM

## 2015-01-27 DIAGNOSIS — E669 Obesity, unspecified: Secondary | ICD-10-CM

## 2015-01-27 NOTE — Progress Notes (Signed)
Pre visit review using our clinic review tool, if applicable. No additional management support is needed unless otherwise documented below in the visit note. 

## 2015-01-27 NOTE — Patient Instructions (Addendum)
Flu shot today  Follow up in 6 months with labs prior for annual exam   Work on limiting sugar and fat in diet  Keep working on weight loss -this is helpful  Get up with your walker and walk more (safely-with shoes) No change in medicines  Labs are stable

## 2015-01-27 NOTE — Assessment & Plan Note (Signed)
Disc goals for lipids and reasons to control them Rev labs with pt Rev low sat fat diet in detail Improved LDL Disc ways to inc HDL

## 2015-01-27 NOTE — Assessment & Plan Note (Signed)
bp in fair control at this time  BP Readings from Last 1 Encounters:  01/27/15 122/64   No changes needed Disc lifstyle change with low sodium diet and exercise  Labs reviewed  Enc more exercise (under safe conditions)

## 2015-01-27 NOTE — Assessment & Plan Note (Signed)
Lab Results  Component Value Date   HGBA1C 6.5 01/20/2015   Overall improving  Also commended wt loss  Info given on low glycemic diet  F/u 6 mo  utd eye exam

## 2015-01-27 NOTE — Progress Notes (Signed)
Subjective:    Patient ID: Erin [name], female    DOB: [birth year], 81 y.o.   MRN: [id number]  HPI Here for f/u of chronic health problems and also for flu vaccine   Is feeling pretty good overall No falls  Being more cautious - taking time getting up  Not using her walker as much as she should   Wt is down 13 lb with bmi of 34 Re vamped diet over the summer  Not a lot of exercise - a little walking in her house  Niece says she sleeps all day and stays up all night    bp is stable today  No cp or palpitations or headaches or edema  No side effects to medicines  BP Readings from Last 3 Encounters:  01/27/15 122/64  07/26/14 120/68  01/23/14 110/50     Diabetes Home sugar results  DM diet - improved  Exercise -not good  Symptoms A1C last  Lab Results  Component Value Date   HGBA1C 6.5 01/20/2015  this is down from 6.6   No problems with medications -metformin Renal protection-none  Last eye exam - just had it last Tuesday - did have a change in px (no retinopathy)    Hyperlipidemia Lab Results  Component Value Date   CHOL 114 01/20/2015   CHOL 130 07/19/2014   CHOL 125 01/17/2014   Lab Results  Component Value Date   HDL 38.60* 01/20/2015   HDL 44.40 07/19/2014   HDL 32.20* 01/17/2014   Lab Results  Component Value Date   LDLCALC 48 01/20/2015   LDLCALC 53 07/16/2013   LDLCALC 71 01/07/2012   Lab Results  Component Value Date   TRIG 138.0 01/20/2015   TRIG 205.0* 07/19/2014   TRIG 219.0* 01/17/2014   Lab Results  Component Value Date   CHOLHDL 3 01/20/2015   CHOLHDL 3 07/19/2014   CHOLHDL 4 01/17/2014   Lab Results  Component Value Date   LDLDIRECT 38.0 07/19/2014   LDLDIRECT 34.9 01/17/2014   LDLDIRECT 56.4 06/28/2012   On atorvastain 10 mg daily  HDL went down-other numbers improved Not enough exercise   Thyroid is ok  Lab Results  Component Value Date   TSH 3.92 01/20/2015    In the normal range      Patient Active  Problem List   Diagnosis Date Noted  . Colon cancer screening 07/26/2014  . Elevated TSH 07/26/2014  . Elevated serum creatinine 01/24/2014  . Nausea with vomiting 01/07/2014  . Left groin pain 01/07/2014  . Post herpetic neuralgia 10/16/2012  . Encounter for Medicare annual wellness exam 07/05/2012  . Risk for falls 07/05/2012  . Colon cancer H/O 04/07/2012  . Sacroiliac joint dysfunction 04/01/2012  . Sciatica 03/31/2012  . Low back pain 04/02/2011  . Frequent UTI 11/30/2010  . Other screening mammogram 09/30/2010  . DM2 (diabetes mellitus, type 2) ([place]) 09/30/2010  . Post-menopausal 09/30/2010  . ABDOMINAL WALL HERNIA 02/26/2010  . Anemia 08/18/2009  . Obesity 10/31/2007  . Essential hypertension 10/31/2007  . Hyperlipidemia LDL goal <100 07/26/2006  . HEMORRHOIDS 07/26/2006  . OSTEOARTHRITIS 07/26/2006  . Osteopenia 07/26/2006  . SLEEP DISORDER 07/26/2006   Past Medical History  Diagnosis Date  . Diabetes mellitus   . Hyperlipidemia   . Osteoarthritis   . Osteoporosis   . Hypertension   . Obesity   . Upper GI bleed     AV malformation/when anticoag  . Sacroiliac joint dysfunction 04/01/2012   Past Surgical  History  Procedure Laterality Date  . Appendectomy    . Cholecystectomy    . Colon resection  1988    secondary to cancer  . Polypectomy  1998    benign X 2  . Cervicitis  1964    conization   . Total knee arthroplasty  07/2009    right   . Endometrial polyp  05/2000    hyperplasia-laser treatment  . Joint replacement      Rt knee replacement   Social History  Substance Use Topics  . Smoking status: Former Smoker    Types: Cigarettes    Quit date: 08/14/1962  . Smokeless tobacco: Never Used     Comment: Quit over 10 years ago  . Alcohol Use: No   Family History  Problem Relation Age of Onset  . Stroke Father   . Cancer Brother     lung   Allergies  Allergen Reactions  . Diclofenac Sodium Other (See Comments)    : vomiting  . Fluoxetine  Hcl Other (See Comments)     vomiting  . Hydrocodone     Nausea and vomiting   . Shellfish Allergy Nausea And Vomiting    Just feels "sick'   Current Outpatient Prescriptions on File Prior to Visit  Medication Sig Dispense Refill  . acetaminophen (TYLENOL) 500 MG tablet Take 1,000 mg by mouth every 4 (four) hours as needed.    Marland Kitchen amLODipine (NORVASC) 5 MG tablet TAKE 1 TABLET BY MOUTH DAILY 30 tablet 2  . aspirin 325 MG tablet Take 325 mg by mouth daily.    Marland Kitchen atorvastatin (LIPITOR) 10 MG tablet TAKE 1 TABLET BY MOUTH DAILY 30 tablet 5  . calcium carbonate (OS-CAL) 600 MG TABS tablet Take 600 mg by mouth 2 (two) times daily with a meal.    . gabapentin (NEURONTIN) 300 MG capsule TAKE 1-2 CAPSULES BY MOUTH 3 (THREE) TIMES PER DAY 180 capsule 3  . imipramine (TOFRANIL) 50 MG tablet TAKE 2 TABLETS BY MOUTH AT BEDTIME. 60 tablet 2  . metFORMIN (GLUCOPHAGE) 500 MG tablet TAKE 1 TABLET BY MOUTH TWICE A DAY WITH MEALS 60 tablet 6  . metoprolol (LOPRESSOR) 50 MG tablet TAKE TWO (2) TABLETS BY MOUTH 2 TIMES DAILY 120 tablet 5  . Multiple Vitamins-Minerals (MULTIVITAMIN WITH MINERALS) tablet Take 1 tablet by mouth daily.    Marland Kitchen omeprazole (PRILOSEC) 20 MG capsule TAKE ONE CAPSULE BY MOUTH DAILY 30 capsule 2  . sennosides-docusate sodium (SENOKOT-S) 8.6-50 MG tablet Take 2 tablets by mouth 2 (two) times daily.    . vitamin C (ASCORBIC ACID) 500 MG tablet Take 500 mg by mouth daily.     No current facility-administered medications on file prior to visit.    Review of Systems    Review of Systems  Constitutional: Negative for fever, appetite change, fatigue and unexpected weight change. (pt has been eating less) Eyes: Negative for pain and visual disturbance.  Respiratory: Negative for cough and shortness of breath.   Cardiovascular: Negative for cp or palpitations    Gastrointestinal: Negative for nausea, diarrhea and constipation.  Genitourinary: Negative for urgency and frequency.  Skin: Negative  for pallor or rash   MSK pos for chronic knee pain  Neurological: Negative for weakness, light-headedness, numbness and headaches.  Hematological: Negative for adenopathy. Does not bruise/bleed easily.  Psychiatric/Behavioral: Negative for dysphoric mood. The patient is not nervous/anxious.      Objective:   Physical Exam  Constitutional: She appears well-developed and well-nourished. No  distress.  Elderly female in wheelchair  Obese with wt loss since last visit   HENT:  Head: Normocephalic and atraumatic.  Mouth/Throat: Oropharynx is clear and moist.  Eyes: Conjunctivae and EOM are normal. Pupils are equal, round, and reactive to light.  Neck: Normal range of motion. Neck supple. No JVD present. Carotid bruit is not present. No thyromegaly present.  Cardiovascular: Normal rate, regular rhythm, normal heart sounds and intact distal pulses.  Exam reveals no gallop.   Pulmonary/Chest: Effort normal and breath sounds normal. No respiratory distress. She has no wheezes. She has no rales.  No crackles  Abdominal: Soft. Bowel sounds are normal. She exhibits no distension, no abdominal bruit and no mass. There is no tenderness.  Musculoskeletal: She exhibits no edema.  Lymphadenopathy:    She has no cervical adenopathy.  Neurological: She is alert. She has normal reflexes.  Skin: Skin is warm and dry. No rash noted.  Psychiatric: She has a normal mood and affect.          Assessment & Plan:   Problem List Items Addressed This Visit      Cardiovascular and Mediastinum   Essential hypertension - Primary    bp in fair control at this time  BP Readings from Last 1 Encounters:  01/27/15 122/64   No changes needed Disc lifstyle change with low sodium diet and exercise  Labs reviewed  Enc more exercise (under safe conditions)        Endocrine   DM2 (diabetes mellitus, type 2) ([facility])    Lab Results  Component Value Date   HGBA1C 6.5 01/20/2015   Overall improving  Also  commended wt loss  Info given on low glycemic diet  F/u 6 mo  utd eye exam        Other   Hyperlipidemia LDL goal <100    Disc goals for lipids and reasons to control them Rev labs with pt Rev low sat fat diet in detail Improved LDL Disc ways to inc HDL      Obesity    Discussed how this problem influences overall health and the risks it imposes  Reviewed plan for weight loss with lower calorie diet (via better food choices and also portion control or program like weight watchers) and exercise building up to or more than 30 minutes 5 days per week including some aerobic activity   Commended on wt loss so far Will focus on safe /supervised exercise -walking with walker        Other Visit Diagnoses    Need for influenza vaccination        Relevant Orders    Flu Vaccine QUAD 36+ mos PF IM (Fluarix & Fluzone Quad PF) (Completed)

## 2015-01-27 NOTE — Assessment & Plan Note (Signed)
Discussed how this problem influences overall health and the risks it imposes  Reviewed plan for weight loss with lower calorie diet (via better food choices and also portion control or program like weight watchers) and exercise building up to or more than 30 minutes 5 days per week including some aerobic activity   Commended on wt loss so far Will focus on safe /supervised exercise -walking with walker

## 2015-02-07 ENCOUNTER — Encounter: Payer: Self-pay | Admitting: Gastroenterology

## 2015-04-05 ENCOUNTER — Other Ambulatory Visit: Payer: Self-pay | Admitting: Family Medicine

## 2015-04-11 ENCOUNTER — Other Ambulatory Visit: Payer: Self-pay | Admitting: Family Medicine

## 2015-05-17 ENCOUNTER — Other Ambulatory Visit: Payer: Self-pay | Admitting: Family Medicine

## 2015-05-21 DIAGNOSIS — M79674 Pain in right toe(s): Secondary | ICD-10-CM | POA: Diagnosis not present

## 2015-05-21 DIAGNOSIS — B351 Tinea unguium: Secondary | ICD-10-CM | POA: Diagnosis not present

## 2015-05-21 DIAGNOSIS — M79675 Pain in left toe(s): Secondary | ICD-10-CM | POA: Diagnosis not present

## 2015-07-19 ENCOUNTER — Telehealth: Payer: Self-pay | Admitting: Family Medicine

## 2015-07-19 DIAGNOSIS — I10 Essential (primary) hypertension: Secondary | ICD-10-CM

## 2015-07-19 DIAGNOSIS — E785 Hyperlipidemia, unspecified: Secondary | ICD-10-CM

## 2015-07-19 DIAGNOSIS — E119 Type 2 diabetes mellitus without complications: Secondary | ICD-10-CM

## 2015-07-19 NOTE — Telephone Encounter (Signed)
-----   Message from Marchia Bond sent at 07/17/2015  2:32 PM EDT ----- Regarding: Cpx labs Wed 4/12, need orders. Thanks! :-) Please order  future cpx labs for pt's upcoming lab appt. Thanks Aniceto Boss

## 2015-07-23 ENCOUNTER — Other Ambulatory Visit (INDEPENDENT_AMBULATORY_CARE_PROVIDER_SITE_OTHER): Payer: Medicare Other

## 2015-07-23 DIAGNOSIS — I10 Essential (primary) hypertension: Secondary | ICD-10-CM

## 2015-07-23 DIAGNOSIS — E119 Type 2 diabetes mellitus without complications: Secondary | ICD-10-CM

## 2015-07-23 DIAGNOSIS — E785 Hyperlipidemia, unspecified: Secondary | ICD-10-CM | POA: Diagnosis not present

## 2015-07-23 LAB — CBC WITH DIFFERENTIAL/PLATELET
BASOS PCT: 0.3 % (ref 0.0–3.0)
Basophils Absolute: 0 10*3/uL (ref 0.0–0.1)
EOS PCT: 0.6 % (ref 0.0–5.0)
Eosinophils Absolute: 0 10*3/uL (ref 0.0–0.7)
HCT: 33.7 % — ABNORMAL LOW (ref 36.0–46.0)
Hemoglobin: 11.2 g/dL — ABNORMAL LOW (ref 12.0–15.0)
LYMPHS ABS: 2.6 10*3/uL (ref 0.7–4.0)
Lymphocytes Relative: 36.6 % (ref 12.0–46.0)
MCHC: 33.3 g/dL (ref 30.0–36.0)
MCV: 86.7 fl (ref 78.0–100.0)
MONO ABS: 0.6 10*3/uL (ref 0.1–1.0)
MONOS PCT: 8.3 % (ref 3.0–12.0)
NEUTROS ABS: 3.9 10*3/uL (ref 1.4–7.7)
NEUTROS PCT: 54.2 % (ref 43.0–77.0)
PLATELETS: 179 10*3/uL (ref 150.0–400.0)
RBC: 3.89 Mil/uL (ref 3.87–5.11)
RDW: 15.8 % — AB (ref 11.5–15.5)
WBC: 7.2 10*3/uL (ref 4.0–10.5)

## 2015-07-23 LAB — LIPID PANEL
Cholesterol: 147 mg/dL (ref 0–200)
HDL: 36 mg/dL — AB (ref >39.00)
NONHDL: 111.2
TRIGLYCERIDES: 262 mg/dL — AB (ref 0.0–149.0)
Total CHOL/HDL Ratio: 4
VLDL: 52.4 mg/dL — ABNORMAL HIGH (ref 0.0–40.0)

## 2015-07-23 LAB — COMPREHENSIVE METABOLIC PANEL
ALK PHOS: 72 U/L (ref 39–117)
ALT: 13 U/L (ref 0–35)
AST: 22 U/L (ref 0–37)
Albumin: 4 g/dL (ref 3.5–5.2)
BUN: 16 mg/dL (ref 6–23)
CO2: 26 mEq/L (ref 19–32)
Calcium: 10.2 mg/dL (ref 8.4–10.5)
Chloride: 104 mEq/L (ref 96–112)
Creatinine, Ser: 1.49 mg/dL — ABNORMAL HIGH (ref 0.40–1.20)
GFR: 42.77 mL/min — AB (ref >60.00)
GLUCOSE: 131 mg/dL — AB (ref 70–99)
POTASSIUM: 3.4 meq/L — AB (ref 3.5–5.1)
Sodium: 138 mEq/L (ref 135–145)
TOTAL PROTEIN: 7.7 g/dL (ref 6.0–8.3)
Total Bilirubin: 0.4 mg/dL (ref 0.2–1.2)

## 2015-07-23 LAB — LDL CHOLESTEROL, DIRECT: Direct LDL: 39 mg/dL

## 2015-07-23 LAB — HEMOGLOBIN A1C: Hgb A1c MFr Bld: 6.3 % (ref 4.6–6.5)

## 2015-07-23 LAB — TSH: TSH: 4.41 u[IU]/mL (ref 0.35–4.50)

## 2015-07-28 ENCOUNTER — Other Ambulatory Visit: Payer: Self-pay | Admitting: Family Medicine

## 2015-07-28 NOTE — Addendum Note (Signed)
Addended by: Ellamae Sia on: 07/28/2015 02:13 PM   Modules accepted: Orders

## 2015-07-30 ENCOUNTER — Ambulatory Visit (INDEPENDENT_AMBULATORY_CARE_PROVIDER_SITE_OTHER): Payer: Medicare Other | Admitting: Family Medicine

## 2015-07-30 ENCOUNTER — Encounter: Payer: Self-pay | Admitting: Family Medicine

## 2015-07-30 VITALS — BP 124/58 | HR 76 | Temp 98.8°F | Wt 210.5 lb

## 2015-07-30 DIAGNOSIS — E785 Hyperlipidemia, unspecified: Secondary | ICD-10-CM

## 2015-07-30 DIAGNOSIS — E119 Type 2 diabetes mellitus without complications: Secondary | ICD-10-CM | POA: Diagnosis not present

## 2015-07-30 DIAGNOSIS — M8580 Other specified disorders of bone density and structure, unspecified site: Secondary | ICD-10-CM

## 2015-07-30 DIAGNOSIS — Z0000 Encounter for general adult medical examination without abnormal findings: Secondary | ICD-10-CM | POA: Diagnosis not present

## 2015-07-30 DIAGNOSIS — Z1211 Encounter for screening for malignant neoplasm of colon: Secondary | ICD-10-CM

## 2015-07-30 DIAGNOSIS — C189 Malignant neoplasm of colon, unspecified: Secondary | ICD-10-CM

## 2015-07-30 DIAGNOSIS — I10 Essential (primary) hypertension: Secondary | ICD-10-CM | POA: Diagnosis not present

## 2015-07-30 DIAGNOSIS — R7989 Other specified abnormal findings of blood chemistry: Secondary | ICD-10-CM

## 2015-07-30 DIAGNOSIS — R748 Abnormal levels of other serum enzymes: Secondary | ICD-10-CM

## 2015-07-30 DIAGNOSIS — E669 Obesity, unspecified: Secondary | ICD-10-CM

## 2015-07-30 NOTE — Progress Notes (Signed)
Subjective:    Patient ID: Erin [name], female    DOB: [birth year], 82 y.o.   MRN: [id number]  HPI Here for annual medicare wellness visit as well as chronic/acute medical problems   I have personally reviewed the Medicare Annual Wellness questionnaire and have noted 1. The patient's medical and social history 2. Their use of alcohol, tobacco or illicit drugs 3. Their current medications and supplements 4. The patient's functional ability including ADL's, fall risks, home safety risks and hearing or visual             impairment. 5. Diet and physical activities 6. Evidence for depression or mood disorders  The patients weight, height, BMI have been recorded in the chart and visual acuity is per eye clinic.  I have made referrals, counseling and provided education to the patient based review of the above and I have provided the pt with a written personalized care plan for preventive services. Reviewed and updated provider list, see scanned forms.  See scanned forms.  Routine anticipatory guidance given to patient.  See health maintenance. Colon cancer screening 9/10 - originally 5 years recall and according to pt they said she no longer needed it , is interested in cologuard  Breast cancer screening- declines mammograms or breast cancer screening  Self breast exam-no lumps or changes  Flu vaccine was 10/16 Tetanus vaccine 12/08 Pneumovax- complete  Zoster vaccine - did not get - still has post shingles pain anyway and ins does not cover  dexa 4/15 -osteopenia  No fractures, has had some falls (2)  One fall - lost balance when reaching  Other fall- slipped off bed- slipped the edge -no injury  Has a walker at home - does not use it at all times (she argues with her family about using it) Thinks she would benefit from a shower chair  Is taking her ca and D  Declines dexa this year  Advance directive has a living will and POA  Cognitive function addressed- see scanned forms- and if  abnormal then additional documentation follows. Notes some loss of short term memory - tip of the tongue / misplaces   PMH and SH reviewed  Meds, vitals, and allergies reviewed.   ROS: See HPI.  Otherwise negative.    Wt is down 14 lb Down 27 lb in the past year - making healthier choices! Also appetite is down she thinks due to age as well  bmi is 32   bp is stable today  No cp or palpitations or headaches or edema  No side effects to medicines  BP Readings from Last 3 Encounters:  07/30/15 124/58  01/27/15 122/64  07/26/14 120/68       Chemistry      Component Value Date/Time   NA 138 07/23/2015 0810   K 3.4* 07/23/2015 0810   CL 104 07/23/2015 0810   CO2 26 07/23/2015 0810   BUN 16 07/23/2015 0810   CREATININE 1.49* 07/23/2015 0810      Component Value Date/Time   CALCIUM 10.2 07/23/2015 0810   ALKPHOS 72 07/23/2015 0810   AST 22 07/23/2015 0810   ALT 13 07/23/2015 0810   BILITOT 0.4 07/23/2015 0810     eats a varied diet   Cr is up  Urine clear at last check No urinary symptoms   Still not drinking enough water - her family can help [place] with that - will start having more available and measuring how much    DM2  Lab Results  Component Value Date   HGBA1C 6.3 07/23/2015   this is down from 6.5 Better eating   Mobility impaired Needs handicapped parking form done today  Hyperlipidemia Lab Results  Component Value Date   CHOL 147 07/23/2015   CHOL 114 01/20/2015   CHOL 130 07/19/2014   Lab Results  Component Value Date   HDL 36.00* 07/23/2015   HDL 38.60* 01/20/2015   HDL 44.40 07/19/2014   Lab Results  Component Value Date   LDLCALC 48 01/20/2015   LDLCALC 53 07/16/2013   [place] 71 01/07/2012   Lab Results  Component Value Date   TRIG 262.0* 07/23/2015   TRIG 138.0 01/20/2015   TRIG 205.0* 07/19/2014   Lab Results  Component Value Date   CHOLHDL 4 07/23/2015   CHOLHDL 3 01/20/2015   CHOLHDL 3 07/19/2014   Lab Results  Component  Value Date   LDLDIRECT 39.0 07/23/2015   LDLDIRECT 38.0 07/19/2014   LDLDIRECT 34.9 01/17/2014  pretty stable with lipitor and diet  Needs more exercise   Mild anemia Lab Results  Component Value Date   WBC 7.2 07/23/2015   HGB 11.2* 07/23/2015   HCT 33.7* 07/23/2015   MCV 86.7 07/23/2015   PLT 179.0 07/23/2015   no blood in stool Cr is up   Patient Active Problem List   Diagnosis Date Noted  . Colon cancer screening 07/26/2014  . Elevated TSH 07/26/2014  . Elevated serum creatinine 01/24/2014  . Nausea with vomiting 01/07/2014  . Left groin pain 01/07/2014  . Post herpetic neuralgia 10/16/2012  . Encounter for Medicare annual wellness exam 07/05/2012  . Risk for falls 07/05/2012  . Colon cancer H/O 04/07/2012  . Sacroiliac joint dysfunction 04/01/2012  . Sciatica 03/31/2012  . Low back pain 04/02/2011  . Frequent UTI 11/30/2010  . Other screening mammogram 09/30/2010  . DM2 (diabetes mellitus, type 2) ([place]) 09/30/2010  . Post-menopausal 09/30/2010  . ABDOMINAL WALL HERNIA 02/26/2010  . Anemia 08/18/2009  . Obesity 10/31/2007  . Essential hypertension 10/31/2007  . Hyperlipidemia LDL goal <100 07/26/2006  . HEMORRHOIDS 07/26/2006  . OSTEOARTHRITIS 07/26/2006  . Osteopenia 07/26/2006  . SLEEP DISORDER 07/26/2006   Past Medical History  Diagnosis Date  . Diabetes mellitus   . Hyperlipidemia   . Osteoarthritis   . Osteoporosis   . Hypertension   . Obesity   . Upper GI bleed     AV malformation/when anticoag  . Sacroiliac joint dysfunction 04/01/2012   Past Surgical History  Procedure Laterality Date  . Appendectomy    . Cholecystectomy    . Colon resection  1988    secondary to cancer  . Polypectomy  1998    benign X 2  . Cervicitis  1964    conization   . Total knee arthroplasty  07/2009    right   . Endometrial polyp  05/2000    hyperplasia-laser treatment  . Joint replacement      Rt knee replacement   Social History  Substance Use Topics  .  Smoking status: Former Smoker    Types: Cigarettes    Quit date: 08/14/1962  . Smokeless tobacco: Never Used     Comment: Quit over 10 years ago  . Alcohol Use: No   Family History  Problem Relation Age of Onset  . Stroke Father   . Cancer Brother     lung   Allergies  Allergen Reactions  . Diclofenac Sodium Other (See Comments)    :  vomiting  . Fluoxetine Hcl Other (See Comments)     vomiting  . Hydrocodone     Nausea and vomiting   . Shellfish Allergy Nausea And Vomiting    Just feels "sick'   Current Outpatient Prescriptions on File Prior to Visit  Medication Sig Dispense Refill  . acetaminophen (TYLENOL) 500 MG tablet Take 1,000 mg by mouth every 4 (four) hours as needed.    Marland Kitchen amLODipine (NORVASC) 5 MG tablet TAKE 1 TABLET BY MOUTH DAILY 30 tablet 2  . aspirin 325 MG tablet Take 325 mg by mouth daily.    Marland Kitchen atorvastatin (LIPITOR) 10 MG tablet TAKE 1 TABLET BY MOUTH DAILY 30 tablet 2  . calcium carbonate (OS-CAL) 600 MG TABS tablet Take 600 mg by mouth 2 (two) times daily with a meal.    . gabapentin (NEURONTIN) 300 MG capsule TAKE 1 OR 2 CAPSULES BY MOUTH UP TO 3 TIMES A DAY AS DIRECTED 180 capsule 2  . metoprolol (LOPRESSOR) 50 MG tablet TAKE TWO (2) TABLETS BY MOUTH 2 TIMES DAILY 120 tablet 5  . Multiple Vitamins-Minerals (MULTIVITAMIN WITH MINERALS) tablet Take 1 tablet by mouth daily.    Marland Kitchen omeprazole (PRILOSEC) 20 MG capsule TAKE ONE CAPSULE BY MOUTH DAILY 30 capsule 3  . sennosides-docusate sodium (SENOKOT-S) 8.6-50 MG tablet Take 2 tablets by mouth 2 (two) times daily.    . vitamin C (ASCORBIC ACID) 500 MG tablet Take 500 mg by mouth daily.    Marland Kitchen imipramine (TOFRANIL) 50 MG tablet TAKE 2 TABLETS BY MOUTH AT BEDTIME. 60 tablet 11  . metFORMIN (GLUCOPHAGE) 500 MG tablet TAKE 1 TABLET BY MOUTH TWICE A DAY WITH MEALS 60 tablet 11   No current facility-administered medications on file prior to visit.    Review of Systems Review of Systems  Constitutional: Negative for  fever, fatigue and unexpected weight change.  Eyes: Negative for pain and visual disturbance.  Respiratory: Negative for cough and shortness of breath.   Cardiovascular: Negative for cp or palpitations    Gastrointestinal: Negative for nausea, diarrhea and constipation.  Genitourinary: Negative for urgency and frequency.  Skin: Negative for pallor or rash  pos for post herpetic neuralgia on L chest and shoulder  MSK pos for arthritis pain and mobility impairment  Neurological: Negative for weakness, light-headedness, numbness and headaches.  Hematological: Negative for adenopathy. Does not bruise/bleed easily.  Psychiatric/Behavioral: Negative for dysphoric mood. The patient is not nervous/anxious.         Objective:   Physical Exam  Constitutional: She appears well-developed and well-nourished. No distress.  Obese and well appearing elderly female in wheelchair  Wt loss noted however   HENT:  Head: Normocephalic and atraumatic.  Right Ear: External ear normal.  Left Ear: External ear normal.  Nose: Nose normal.  Mouth/Throat: Oropharynx is clear and moist.  Eyes: Conjunctivae and EOM are normal. Pupils are equal, round, and reactive to light. Right eye exhibits no discharge. Left eye exhibits no discharge. No scleral icterus.  Neck: Normal range of motion. Neck supple. No JVD present. Carotid bruit is not present. No thyromegaly present.  Cardiovascular: Normal rate, regular rhythm, normal heart sounds and intact distal pulses.  Exam reveals no gallop.   Pulmonary/Chest: Effort normal and breath sounds normal. No respiratory distress. She has no wheezes. She has no rales.  Abdominal: Soft. Bowel sounds are normal. She exhibits no distension and no mass. There is no tenderness.  Genitourinary:  Declines breast exam  Musculoskeletal: She exhibits no  edema or tenderness.  Mild kyphosis  Lymphadenopathy:    She has no cervical adenopathy.  Neurological: She is alert. She has normal  reflexes. No cranial nerve deficit. She exhibits normal muscle tone. Coordination normal.  Skin: Skin is warm and dry. No rash noted. No erythema. No pallor.  Scarring from prev zoster on L chest   Psychiatric: She has a normal mood and affect.          Assessment & Plan:   Problem List Items Addressed This Visit      Cardiovascular and Mediastinum   Essential hypertension - Primary    bp in fair control at this time  BP Readings from Last 1 Encounters:  07/30/15 124/58   No changes needed Disc lifstyle change with low sodium diet and exercise  Labs reviewed       Relevant Orders   CBC with Differential/Platelet   Comprehensive metabolic panel     Digestive   Colon cancer H/O (Chronic)    Pt does not want to do further screening colonoscopies at her adv age Is agreeable to cologuard - will order this for screening         Endocrine   DM2 (diabetes mellitus, type 2) ([place])    Improved with better diet  Lab Results  Component Value Date   HGBA1C 6.3 07/23/2015   Commended also on wt loss Continue to monitor       Relevant Orders   Hemoglobin A1c   Microalbumin / creatinine urine ratio     Musculoskeletal and Integument   Osteopenia    Declines further dexa  Has had falls but no fx  Disc need for calcium/ vitamin D/ wt bearing exercise and bone density test every 2 y to monitor Disc safety/ fracture risk in detail           Other   Colon cancer screening    Pt has had colon cancer  She declines further colonoscopy  Will do cologuard testing       Elevated serum creatinine    Renal insuff- worse on labs Lab Results  Component Value Date   CREATININE 1.49* 07/23/2015    Pt states she is not drinking water -disc plan to inc fluids  Re check 3 mo  Suspect multifactorial in elderly female with DM and HTN       Relevant Orders   Comprehensive metabolic panel   Encounter for Medicare annual wellness exam    Reviewed health habits including diet  and exercise and skin cancer prevention Reviewed appropriate screening tests for age  Also reviewed health mt list, fam hx and immunization status , as well as social and family history   See HPI Labs reviewed Declines shingles vaccine due to cost (also has post herp neuralgia already)  Declines dexa this year  Declines mammogram Try to increase water to at least 10-12 (8 oz) servings of water a day  Take care of yourself Keep walking with your walker  We will order the cologuard test for colon cancer screening       Hyperlipidemia LDL goal <100    Disc goals for lipids and reasons to control them Rev labs with pt Rev low sat fat diet in detail Continue lipitor and diet       Obesity    Discussed how this problem influences overall health and the risks it imposes  Reviewed plan for weight loss with lower calorie diet (via better food choices and also portion control  or program like weight watchers) and exercise building up to or more than 30 minutes 5 days per week including some aerobic activity   Commended on wt loss so far  Continue to follow

## 2015-07-30 NOTE — Progress Notes (Signed)
Pre visit review using our clinic review tool, if applicable. No additional management support is needed unless otherwise documented below in the visit note. 

## 2015-07-30 NOTE — Patient Instructions (Signed)
Your kidney number is worse Try to increase water to at least 10-12 (8 oz) servings of water a day  Take care of yourself Keep walking with your walker  We will order the cologuard test for colon cancer screening   Follow up in 3 months with labs prior - keeping an eye on kidneys and anemia

## 2015-07-31 NOTE — Assessment & Plan Note (Signed)
Declines further dexa  Has had falls but no fx  Disc need for calcium/ vitamin D/ wt bearing exercise and bone density test every 2 y to monitor Disc safety/ fracture risk in detail

## 2015-07-31 NOTE — Assessment & Plan Note (Signed)
bp in fair control at this time  BP Readings from Last 1 Encounters:  07/30/15 124/58   No changes needed Disc lifstyle change with low sodium diet and exercise  Labs reviewed

## 2015-07-31 NOTE — Assessment & Plan Note (Signed)
Disc goals for lipids and reasons to control them Rev labs with pt Rev low sat fat diet in detail Continue lipitor and diet

## 2015-07-31 NOTE — Assessment & Plan Note (Signed)
Reviewed health habits including diet and exercise and skin cancer prevention Reviewed appropriate screening tests for age  Also reviewed health mt list, fam hx and immunization status , as well as social and family history   See HPI Labs reviewed Declines shingles vaccine due to cost (also has post herp neuralgia already)  Declines dexa this year  Declines mammogram Try to increase water to at least 10-12 (8 oz) servings of water a day  Take care of yourself Keep walking with your walker  We will order the cologuard test for colon cancer screening

## 2015-07-31 NOTE — Assessment & Plan Note (Signed)
Discussed how this problem influences overall health and the risks it imposes  Reviewed plan for weight loss with lower calorie diet (via better food choices and also portion control or program like weight watchers) and exercise building up to or more than 30 minutes 5 days per week including some aerobic activity   Commended on wt loss so far  Continue to follow

## 2015-07-31 NOTE — Assessment & Plan Note (Signed)
Renal insuff- worse on labs Lab Results  Component Value Date   CREATININE 1.49* 07/23/2015    Pt states she is not drinking water -disc plan to inc fluids  Re check 3 mo  Suspect multifactorial in elderly female with DM and HTN

## 2015-07-31 NOTE — Assessment & Plan Note (Signed)
Pt does not want to do further screening colonoscopies at her adv age Is agreeable to cologuard - will order this for screening

## 2015-07-31 NOTE — Assessment & Plan Note (Signed)
Improved with better diet  Lab Results  Component Value Date   HGBA1C 6.3 07/23/2015   Commended also on wt loss Continue to monitor

## 2015-07-31 NOTE — Assessment & Plan Note (Signed)
Pt has had colon cancer  She declines further colonoscopy  Will do cologuard testing

## 2015-08-08 ENCOUNTER — Other Ambulatory Visit: Payer: Self-pay | Admitting: Family Medicine

## 2015-08-25 ENCOUNTER — Emergency Department (HOSPITAL_COMMUNITY): Payer: Medicare Other

## 2015-08-25 ENCOUNTER — Inpatient Hospital Stay (HOSPITAL_COMMUNITY): Payer: Medicare Other

## 2015-08-25 ENCOUNTER — Inpatient Hospital Stay (HOSPITAL_COMMUNITY)
Admission: EM | Admit: 2015-08-25 | Discharge: 2015-08-29 | DRG: 689 | Disposition: A | Discharge status: Skilled Nursing Facility | Payer: Medicare Other | Attending: Internal Medicine | Admitting: Internal Medicine

## 2015-08-25 ENCOUNTER — Encounter (HOSPITAL_COMMUNITY): Payer: Self-pay | Admitting: Emergency Medicine

## 2015-08-25 ENCOUNTER — Telehealth: Payer: Self-pay | Admitting: Family Medicine

## 2015-08-25 DIAGNOSIS — R700 Elevated erythrocyte sedimentation rate: Secondary | ICD-10-CM | POA: Diagnosis present

## 2015-08-25 DIAGNOSIS — Z515 Encounter for palliative care: Secondary | ICD-10-CM | POA: Diagnosis not present

## 2015-08-25 DIAGNOSIS — R402411 Glasgow coma scale score 13-15, in the field [EMT or ambulance]: Secondary | ICD-10-CM | POA: Diagnosis not present

## 2015-08-25 DIAGNOSIS — G20 Parkinson's disease: Secondary | ICD-10-CM | POA: Diagnosis not present

## 2015-08-25 DIAGNOSIS — Z823 Family history of stroke: Secondary | ICD-10-CM | POA: Diagnosis not present

## 2015-08-25 DIAGNOSIS — R262 Difficulty in walking, not elsewhere classified: Secondary | ICD-10-CM | POA: Diagnosis not present

## 2015-08-25 DIAGNOSIS — Z809 Family history of malignant neoplasm, unspecified: Secondary | ICD-10-CM

## 2015-08-25 DIAGNOSIS — I671 Cerebral aneurysm, nonruptured: Secondary | ICD-10-CM | POA: Diagnosis present

## 2015-08-25 DIAGNOSIS — Z79899 Other long term (current) drug therapy: Secondary | ICD-10-CM | POA: Diagnosis not present

## 2015-08-25 DIAGNOSIS — E785 Hyperlipidemia, unspecified: Secondary | ICD-10-CM | POA: Diagnosis not present

## 2015-08-25 DIAGNOSIS — I729 Aneurysm of unspecified site: Secondary | ICD-10-CM

## 2015-08-25 DIAGNOSIS — G9341 Metabolic encephalopathy: Secondary | ICD-10-CM | POA: Diagnosis not present

## 2015-08-25 DIAGNOSIS — Z8673 Personal history of transient ischemic attack (TIA), and cerebral infarction without residual deficits: Secondary | ICD-10-CM | POA: Insufficient documentation

## 2015-08-25 DIAGNOSIS — R102 Pelvic and perineal pain: Secondary | ICD-10-CM | POA: Diagnosis not present

## 2015-08-25 DIAGNOSIS — R7303 Prediabetes: Secondary | ICD-10-CM

## 2015-08-25 DIAGNOSIS — Z87891 Personal history of nicotine dependence: Secondary | ICD-10-CM

## 2015-08-25 DIAGNOSIS — Z91013 Allergy to seafood: Secondary | ICD-10-CM | POA: Diagnosis not present

## 2015-08-25 DIAGNOSIS — Z96651 Presence of right artificial knee joint: Secondary | ICD-10-CM | POA: Diagnosis present

## 2015-08-25 DIAGNOSIS — K5901 Slow transit constipation: Secondary | ICD-10-CM | POA: Insufficient documentation

## 2015-08-25 DIAGNOSIS — R918 Other nonspecific abnormal finding of lung field: Secondary | ICD-10-CM | POA: Diagnosis not present

## 2015-08-25 DIAGNOSIS — Z885 Allergy status to narcotic agent status: Secondary | ICD-10-CM | POA: Diagnosis not present

## 2015-08-25 DIAGNOSIS — F0280 Dementia in other diseases classified elsewhere without behavioral disturbance: Secondary | ICD-10-CM | POA: Diagnosis present

## 2015-08-25 DIAGNOSIS — I639 Cerebral infarction, unspecified: Secondary | ICD-10-CM

## 2015-08-25 DIAGNOSIS — M6281 Muscle weakness (generalized): Secondary | ICD-10-CM | POA: Diagnosis not present

## 2015-08-25 DIAGNOSIS — M1990 Unspecified osteoarthritis, unspecified site: Secondary | ICD-10-CM | POA: Diagnosis present

## 2015-08-25 DIAGNOSIS — Z888 Allergy status to other drugs, medicaments and biological substances status: Secondary | ICD-10-CM

## 2015-08-25 DIAGNOSIS — E1100 Type 2 diabetes mellitus with hyperosmolarity without nonketotic hyperglycemic-hyperosmolar coma (NKHHC): Secondary | ICD-10-CM | POA: Diagnosis not present

## 2015-08-25 DIAGNOSIS — E876 Hypokalemia: Secondary | ICD-10-CM | POA: Diagnosis present

## 2015-08-25 DIAGNOSIS — E119 Type 2 diabetes mellitus without complications: Secondary | ICD-10-CM | POA: Diagnosis present

## 2015-08-25 DIAGNOSIS — R109 Unspecified abdominal pain: Secondary | ICD-10-CM | POA: Diagnosis not present

## 2015-08-25 DIAGNOSIS — R1311 Dysphagia, oral phase: Secondary | ICD-10-CM | POA: Diagnosis not present

## 2015-08-25 DIAGNOSIS — Z7189 Other specified counseling: Secondary | ICD-10-CM | POA: Insufficient documentation

## 2015-08-25 DIAGNOSIS — N390 Urinary tract infection, site not specified: Secondary | ICD-10-CM | POA: Diagnosis not present

## 2015-08-25 DIAGNOSIS — M810 Age-related osteoporosis without current pathological fracture: Secondary | ICD-10-CM | POA: Diagnosis present

## 2015-08-25 DIAGNOSIS — Z9181 History of falling: Secondary | ICD-10-CM | POA: Diagnosis not present

## 2015-08-25 DIAGNOSIS — M25552 Pain in left hip: Secondary | ICD-10-CM | POA: Diagnosis not present

## 2015-08-25 DIAGNOSIS — G9340 Encephalopathy, unspecified: Secondary | ICD-10-CM | POA: Diagnosis not present

## 2015-08-25 DIAGNOSIS — R5383 Other fatigue: Secondary | ICD-10-CM | POA: Diagnosis not present

## 2015-08-25 DIAGNOSIS — I10 Essential (primary) hypertension: Secondary | ICD-10-CM

## 2015-08-25 DIAGNOSIS — R4182 Altered mental status, unspecified: Secondary | ICD-10-CM | POA: Diagnosis not present

## 2015-08-25 DIAGNOSIS — Z7984 Long term (current) use of oral hypoglycemic drugs: Secondary | ICD-10-CM | POA: Diagnosis not present

## 2015-08-25 DIAGNOSIS — Z7982 Long term (current) use of aspirin: Secondary | ICD-10-CM

## 2015-08-25 DIAGNOSIS — R278 Other lack of coordination: Secondary | ICD-10-CM | POA: Diagnosis not present

## 2015-08-25 DIAGNOSIS — M25551 Pain in right hip: Secondary | ICD-10-CM | POA: Diagnosis not present

## 2015-08-25 DIAGNOSIS — R079 Chest pain, unspecified: Secondary | ICD-10-CM | POA: Diagnosis not present

## 2015-08-25 DIAGNOSIS — I6320 Cerebral infarction due to unspecified occlusion or stenosis of unspecified precerebral arteries: Secondary | ICD-10-CM | POA: Diagnosis not present

## 2015-08-25 DIAGNOSIS — R739 Hyperglycemia, unspecified: Secondary | ICD-10-CM

## 2015-08-25 LAB — CBC WITH DIFFERENTIAL/PLATELET
BASOS ABS: 0 10*3/uL (ref 0.0–0.1)
Basophils Relative: 0 %
EOS PCT: 1 %
Eosinophils Absolute: 0.1 10*3/uL (ref 0.0–0.7)
HCT: 41.6 % (ref 36.0–46.0)
Hemoglobin: 13.8 g/dL (ref 12.0–15.0)
LYMPHS PCT: 27 %
Lymphs Abs: 2 10*3/uL (ref 0.7–4.0)
MCH: 29.4 pg (ref 26.0–34.0)
MCHC: 33.2 g/dL (ref 30.0–36.0)
MCV: 88.7 fL (ref 78.0–100.0)
MONO ABS: 0.7 10*3/uL (ref 0.1–1.0)
Monocytes Relative: 10 %
Neutro Abs: 4.5 10*3/uL (ref 1.7–7.7)
Neutrophils Relative %: 62 %
PLATELETS: 166 10*3/uL (ref 150–400)
RBC: 4.69 MIL/uL (ref 3.87–5.11)
RDW: 14.4 % (ref 11.5–15.5)
WBC: 7.3 10*3/uL (ref 4.0–10.5)

## 2015-08-25 LAB — URINALYSIS, ROUTINE W REFLEX MICROSCOPIC
Bilirubin Urine: NEGATIVE
GLUCOSE, UA: NEGATIVE mg/dL
KETONES UR: 15 mg/dL — AB
Nitrite: NEGATIVE
PROTEIN: 30 mg/dL — AB
Specific Gravity, Urine: 1.008 (ref 1.005–1.030)
pH: 6.5 (ref 5.0–8.0)

## 2015-08-25 LAB — HEPATIC FUNCTION PANEL
ALT: 14 U/L (ref 14–54)
AST: 28 U/L (ref 15–41)
Albumin: 3.8 g/dL (ref 3.5–5.0)
Alkaline Phosphatase: 57 U/L (ref 38–126)
BILIRUBIN DIRECT: 0.3 mg/dL (ref 0.1–0.5)
BILIRUBIN TOTAL: 1.1 mg/dL (ref 0.3–1.2)
Indirect Bilirubin: 0.8 mg/dL (ref 0.3–0.9)
Total Protein: 8 g/dL (ref 6.5–8.1)

## 2015-08-25 LAB — BASIC METABOLIC PANEL
ANION GAP: 14 (ref 5–15)
BUN: 9 mg/dL (ref 6–20)
CHLORIDE: 100 mmol/L — AB (ref 101–111)
CO2: 25 mmol/L (ref 22–32)
Calcium: 10.3 mg/dL (ref 8.9–10.3)
Creatinine, Ser: 1.2 mg/dL — ABNORMAL HIGH (ref 0.44–1.00)
GFR calc Af Amer: 46 mL/min — ABNORMAL LOW (ref >60)
GFR calc non Af Amer: 40 mL/min — ABNORMAL LOW (ref >60)
GLUCOSE: 103 mg/dL — AB (ref 65–99)
POTASSIUM: 3.5 mmol/L (ref 3.5–5.1)
Sodium: 139 mmol/L (ref 135–145)

## 2015-08-25 LAB — PROTIME-INR
INR: 1.13 (ref 0.00–1.49)
Prothrombin Time: 14.7 seconds (ref 11.6–15.2)

## 2015-08-25 LAB — GLUCOSE, CAPILLARY
GLUCOSE-CAPILLARY: 97 mg/dL (ref 65–99)
GLUCOSE-CAPILLARY: 107 mg/dL — AB (ref 65–99)

## 2015-08-25 LAB — URINE MICROSCOPIC-ADD ON

## 2015-08-25 LAB — TROPONIN I
TROPONIN I: 0.04 ng/mL — AB (ref <0.031)
Troponin I: 0.04 ng/mL — ABNORMAL HIGH (ref <0.031)
Troponin I: 0.04 ng/mL — ABNORMAL HIGH (ref <0.031)

## 2015-08-25 LAB — TSH: TSH: 1.216 u[IU]/mL (ref 0.350–4.500)

## 2015-08-25 LAB — MAGNESIUM: Magnesium: 1.3 mg/dL — ABNORMAL LOW (ref 1.7–2.4)

## 2015-08-25 MED ORDER — ASPIRIN 325 MG PO TABS
325.0000 mg | ORAL_TABLET | Freq: Every day | ORAL | Status: DC
  Administered 2015-08-26 – 2015-08-29 (×4): 325 mg via ORAL
  Filled 2015-08-25 (×5): qty 1

## 2015-08-25 MED ORDER — INSULIN ASPART 100 UNIT/ML ~~LOC~~ SOLN
0.0000 [IU] | Freq: Three times a day (TID) | SUBCUTANEOUS | Status: DC
  Administered 2015-08-27 – 2015-08-29 (×4): 1 [IU] via SUBCUTANEOUS

## 2015-08-25 MED ORDER — CEPHALEXIN 500 MG PO CAPS: 500.0000 mg | ORAL_CAPSULE | Freq: Four times a day (QID) | ORAL | Status: DC

## 2015-08-25 MED ORDER — DOCUSATE SODIUM 100 MG PO CAPS
100.0000 mg | ORAL_CAPSULE | Freq: Two times a day (BID) | ORAL | Status: DC
  Administered 2015-08-26 – 2015-08-28 (×5): 100 mg via ORAL
  Filled 2015-08-25 (×6): qty 1

## 2015-08-25 MED ORDER — PANTOPRAZOLE SODIUM 40 MG PO TBEC
40.0000 mg | DELAYED_RELEASE_TABLET | Freq: Every day | ORAL | Status: DC
  Administered 2015-08-26 – 2015-08-29 (×4): 40 mg via ORAL
  Filled 2015-08-25 (×6): qty 1

## 2015-08-25 MED ORDER — ENOXAPARIN SODIUM 40 MG/0.4ML ~~LOC~~ SOLN: 40.0000 mg | SUBCUTANEOUS | Status: DC

## 2015-08-25 MED ORDER — SODIUM CHLORIDE 0.9 % IV BOLUS (SEPSIS)
500.0000 mL | Freq: Once | INTRAVENOUS | Status: AC
  Administered 2015-08-25: 500 mL via INTRAVENOUS

## 2015-08-25 MED ORDER — INSULIN ASPART 100 UNIT/ML ~~LOC~~ SOLN: 0.0000 [IU] | Freq: Every day | SUBCUTANEOUS | Status: DC

## 2015-08-25 MED ORDER — STROKE: EARLY STAGES OF RECOVERY BOOK
Freq: Once | Status: DC
  Filled 2015-08-25: qty 1

## 2015-08-25 MED ORDER — POLYETHYLENE GLYCOL 3350 17 G PO PACK
17.0000 g | PACK | Freq: Every day | ORAL | Status: DC
Start: 2015-08-25 — End: 2015-08-29
  Administered 2015-08-26 – 2015-08-29 (×4): 17 g via ORAL
  Filled 2015-08-25 (×4): qty 1

## 2015-08-25 MED ORDER — ASPIRIN 300 MG RE SUPP
300.0000 mg | Freq: Every day | RECTAL | Status: DC
  Filled 2015-08-25 (×4): qty 1

## 2015-08-25 MED ORDER — ATORVASTATIN CALCIUM 10 MG PO TABS
10.0000 mg | ORAL_TABLET | Freq: Every day | ORAL | Status: DC
  Administered 2015-08-26 – 2015-08-28 (×3): 10 mg via ORAL
  Filled 2015-08-25 (×3): qty 1

## 2015-08-25 MED ORDER — METOPROLOL TARTRATE 12.5 MG HALF TABLET
12.5000 mg | ORAL_TABLET | Freq: Two times a day (BID) | ORAL | Status: DC
  Administered 2015-08-26 – 2015-08-28 (×5): 12.5 mg via ORAL
  Filled 2015-08-25 (×6): qty 1

## 2015-08-25 MED ORDER — DEXTROSE 5 % IV SOLN
1.0000 g | INTRAVENOUS | Status: DC
  Administered 2015-08-26: 1 g via INTRAVENOUS
  Filled 2015-08-25 (×2): qty 10

## 2015-08-25 MED ORDER — SODIUM CHLORIDE 0.9% FLUSH
3.0000 mL | Freq: Two times a day (BID) | INTRAVENOUS | Status: DC
  Administered 2015-08-26 – 2015-08-29 (×5): 3 mL via INTRAVENOUS

## 2015-08-25 MED ORDER — ENOXAPARIN SODIUM 40 MG/0.4ML ~~LOC~~ SOLN
40.0000 mg | SUBCUTANEOUS | Status: DC
  Administered 2015-08-25 – 2015-08-28 (×4): 40 mg via SUBCUTANEOUS
  Filled 2015-08-25 (×4): qty 0.4

## 2015-08-25 MED ORDER — CEFTRIAXONE SODIUM 1 G IJ SOLR
1.0000 g | Freq: Once | INTRAMUSCULAR | Status: AC
  Administered 2015-08-25: 1 g via INTRAVENOUS
  Filled 2015-08-25: qty 10

## 2015-08-25 MED ORDER — SODIUM CHLORIDE 0.9 % IV SOLN
INTRAVENOUS | Status: AC
  Administered 2015-08-25 – 2015-08-26 (×2): via INTRAVENOUS

## 2015-08-25 NOTE — Telephone Encounter (Signed)
Thanks for letting me know -I will watch for hospital notes

## 2015-08-25 NOTE — Telephone Encounter (Signed)
Cheryl with [facility] called to let Dr Glori Bickers know that when she last spoke with [name] had not been called; I spoke with Clare Gandy and EMS is at pts residence now. FYI to Dr Glori Bickers.

## 2015-08-25 NOTE — Discharge Instructions (Signed)

## 2015-08-25 NOTE — ED Notes (Signed)
Received pt via EMS from home with c/o altered mental status since Saturday. Per family pt has history of UTI with similar symptoms.

## 2015-08-25 NOTE — ED Provider Notes (Signed)
CSN: [id number]     Arrival date & time 08/25/15  1105 History   First MD Initiated Contact with Patient 08/25/15 1136     Chief Complaint  Patient presents with  . Altered Mental Status     (Consider location/radiation/quality/duration/timing/severity/associated sxs/prior Treatment) HPI 82 year old female who presents with her niece who states that she has been less active than usual. Patient lives with a mentally disabled son. Her niece states that she normally comes and checks on her daily but has been out of town for the past week. She noted when she got back on Saturday that she was not as active as usual. Per the niece's report the patient has been eating and drinking without vomiting. Patient denies any increased frequency of urination or loss of bladder control. She has a history of diabetes, hypertension, and hyperlipidemia. She has not had any recent trauma, fever, or chills. Family member states that she had a similar presentation with a urinary tract infection in the past. Past Medical History  Diagnosis Date  . Diabetes mellitus   . Hyperlipidemia   . Osteoarthritis   . Osteoporosis   . Hypertension   . Obesity   . Upper GI bleed     AV malformation/when anticoag  . Sacroiliac joint dysfunction 04/01/2012   Past Surgical History  Procedure Laterality Date  . Appendectomy    . Cholecystectomy    . Colon resection  1988    secondary to cancer  . Polypectomy  1998    benign X 2  . Cervicitis  1964    conization   . Total knee arthroplasty  07/2009    right   . Endometrial polyp  05/2000    hyperplasia-laser treatment  . Joint replacement      Rt knee replacement   Family History  Problem Relation Age of Onset  . Stroke Father   . Cancer Brother     lung   Social History  Substance Use Topics  . Smoking status: Former Smoker    Types: Cigarettes    Quit date: 08/14/1962  . Smokeless tobacco: Never Used     Comment: Quit over 10 years ago  . Alcohol Use:  No   OB History    No data available     Review of Systems  All other systems reviewed and are negative.     Allergies  Diclofenac sodium; Fluoxetine hcl; Hydrocodone; and Shellfish allergy  Home Medications   Prior to Admission medications   Medication Sig Start Date End Date Taking? Authorizing Provider  acetaminophen (TYLENOL) 500 MG tablet Take 1,000 mg by mouth every 4 (four) hours as needed.   Yes Historical Provider, MD  amLODipine (NORVASC) 5 MG tablet TAKE 1 TABLET BY MOUTH DAILY 05/19/15  Yes Abner Greenspan, MD  aspirin 325 MG tablet Take 325 mg by mouth daily.   Yes Historical Provider, MD  calcium carbonate (OS-CAL) 600 MG TABS tablet Take 600 mg by mouth 2 (two) times daily with a meal.   Yes Historical Provider, MD  gabapentin (NEURONTIN) 300 MG capsule TAKE ONE OR TWO CAPSULES BY MOUTH UP TO 3 TIMES A DAY AS DIRECTED. 08/08/15  Yes Abner Greenspan, MD  imipramine (TOFRANIL) 50 MG tablet TAKE 2 TABLETS BY MOUTH AT BEDTIME. 07/30/15  Yes Abner Greenspan, MD  metFORMIN (GLUCOPHAGE) 500 MG tablet TAKE 1 TABLET BY MOUTH TWICE A DAY WITH MEALS 07/30/15  Yes Abner Greenspan, MD  metoprolol (LOPRESSOR) 50 MG tablet TAKE  TWO (2) TABLETS BY MOUTH 2 TIMES DAILY 01/14/15  Yes Abner Greenspan, MD  Multiple Vitamins-Minerals (MULTIVITAMIN WITH MINERALS) tablet Take 1 tablet by mouth daily.   Yes Historical Provider, MD  omeprazole (PRILOSEC) 20 MG capsule TAKE ONE (1) CAPSULE BY MOUTH EACH DAY 08/08/15  Yes Abner Greenspan, MD  sennosides-docusate sodium (SENOKOT-S) 8.6-50 MG tablet Take 2 tablets by mouth 2 (two) times daily.   Yes Historical Provider, MD  vitamin C (ASCORBIC ACID) 500 MG tablet Take 500 mg by mouth daily.   Yes Historical Provider, MD  atorvastatin (LIPITOR) 10 MG tablet TAKE 1 TABLET BY MOUTH DAILY 08/08/15   Wynelle Fanny Tower, MD   BP 181/78 mmHg  Pulse 98  Temp(Src) 98 F (36.7 C) (Rectal)  Resp 17  SpO2 99% Physical Exam  Constitutional: She is oriented to person, place, and  time. She appears well-developed and well-nourished. No distress.  HENT:  Head: Normocephalic and atraumatic.  Right Ear: External ear normal.  Left Ear: External ear normal.  Mouth/Throat: Oropharynx is clear and moist.  Eyes: Pupils are equal, round, and reactive to light.  Neck: Normal range of motion. Neck supple.  Cardiovascular: Normal rate and regular rhythm.   Pulmonary/Chest: Effort normal.  Abdominal: Soft.  Mild diffuse tenderness to palpation  Musculoskeletal: Normal range of motion. She exhibits no edema or tenderness.  Mild bilateral hip tenderness palpation  Neurological: She is alert and oriented to person, place, and time. No cranial nerve deficit. Coordination normal.  Skin: Skin is warm and dry.  Psychiatric: She has a normal mood and affect.  Nursing note and vitals reviewed.   ED Course  Procedures (including critical care time) Labs Review Labs Reviewed  URINALYSIS, ROUTINE W REFLEX MICROSCOPIC (NOT AT [facility]) - Abnormal; Notable for the following:    APPearance CLOUDY (*)    Hgb urine dipstick MODERATE (*)    Ketones, ur 15 (*)    Protein, ur 30 (*)    Leukocytes, UA LARGE (*)    All other components within normal limits  URINE MICROSCOPIC-ADD ON - Abnormal; Notable for the following:    Squamous Epithelial / LPF 6-30 (*)    Bacteria, UA MANY (*)    All other components within normal limits  URINE CULTURE  CBC WITH DIFFERENTIAL/PLATELET  BASIC METABOLIC PANEL    Imaging Review Dg Chest 1 View  08/25/2015  CLINICAL DATA:  Altered mental status for 2 days EXAM: CHEST 1 VIEW COMPARISON:  April 16, 2012 FINDINGS: There is slight scarring in the right apex. Lungs elsewhere are clear. Heart size and pulmonary vascularity are normal. No adenopathy. No pneumothorax. No bone lesions. IMPRESSION: Mild scarring right apex.  No edema or consolidation. Electronically Signed   By: Lowella Grip III M.D.   On: 08/25/2015 13:04   Dg Pelvis 1-2 Views  08/25/2015   CLINICAL DATA:  Pain.  Altered mental status EXAM: PELVIS - 1-2 VIEW COMPARISON:  None. FINDINGS: There is no evidence of pelvic fracture or dislocation. There is mild symmetric narrowing of both hip joints. There is degenerative change in the visualized lumbar spine. No erosive change. IMPRESSION: Areas of osteoarthritic change.  No fracture or dislocation. Electronically Signed   By: Lowella Grip III M.D.   On: 08/25/2015 13:05   Dg Abd 1 View  08/25/2015  CLINICAL DATA:  Abdominal pain and altered mental status EXAM: ABDOMEN - 1 VIEW COMPARISON:  CT abdomen and pelvis January 03, 2014 FINDINGS: There is fairly diffuse  stool throughout the colon. There is no bowel dilatation or air-fluid level suggesting obstruction. No free air is evident on this supine examination. There is degenerative change in the lumbar spine. IMPRESSION: Fairly diffuse stool throughout colon. Bowel gas pattern unremarkable. No demonstrable obstruction or free air. Electronically Signed   By: Lowella Grip III M.D.   On: 08/25/2015 13:05   Ct Head Wo Contrast  08/25/2015  CLINICAL DATA:  Altered mental status for 3 days, initial encounter EXAM: CT HEAD WITHOUT CONTRAST TECHNIQUE: Contiguous axial images were obtained from the base of the skull through the vertex without intravenous contrast. COMPARISON:  04/03/2012 FINDINGS: Bony calvarium is intact. No gross soft tissue abnormality is noted. Mild atrophic and chronic white matter ischemic changes are seen. No findings to suggest acute hemorrhage, acute infarction or space-occupying mass lesion are noted. IMPRESSION: Chronic atrophic and ischemic changes.  No acute abnormality noted. Electronically Signed   By: Inez Catalina M.D.   On: 08/25/2015 14:45   I have personally reviewed and evaluated these images and lab results as part of my medical decision-making.   EKG Interpretation   Date/Time:  Monday Aug 25 2015 11:07:32 EDT Ventricular Rate:  110 PR Interval:   153 QRS Duration: 80 QT Interval:  364 QTC Calculation: 492 R Axis:   9 Text Interpretation:  Sinus tachycardia Anterior infarct, old Non-specific  ST-t changes Confirmed by Lameeka Schleifer MD, Andee Poles [phone]) on 08/25/2015 3:54:14 PM      MDM   Final diagnoses:  UTI (lower urinary tract infection)    82 year old female history of hyperlipidemia and diabetes who presents with some increased weakness over the past 10 days. Evaluation here shows no definite acute abnormalities. She has large leukocyte esterase, bacteria, on urinalysis. She is given Rocephin 1 g IV here. She is discharged to home on Keflex. She is ambulating here. She appears to be hemodynamically stable. I discussed this with her niece who is her caregiver. Attempted to ambulate patient and she is not able to walk even with assistance.  Plan observation for further assessment and possible pt.   Pattricia Boss, MD 08/25/15 [phone]

## 2015-08-25 NOTE — Progress Notes (Signed)
RN called 5C for RN stroke swallow evaluation and for NIH scale assessment.  [facility] RN stated they could not come at the moment due to a unit emergency.  Per day shift RN, rapid response -- who are currently on [facility] -- had also been called earlier.  Will try again later.

## 2015-08-25 NOTE — H&P (Signed)
[facility] History and Physical  Erin [name] [id number] DOB: [birth year] DOA: 08/25/2015  Referring physician:     PCP: [name], MD   Chief Complaint: lethargy  HPI:   82 year old   female with a history of diabetes, dyslipidemia, hypertension, morbid obesity, fairly independent at home, and living alone with her niece who is her POA and her caregiver, brought in today to the ER because of lethargy, mental slowness, poor appetite. The patient was last seen normal almost a week ago. Patient's niece who currently provides most of the history states that the patient had some nausea and dry heaving on Tuesday, subsequently developed urinary incontinence and Thursday. She has been extremely slow with her speech and gait and unable to get out of bed. Patient is able to speak but her speech is extremely delayed. Patient is on aspirin 325 mg a day. No recent history of falls. In the ER the CT of the head does not show any acute abnormality. The patient does have a mild urinary tract infection. Chest x-ray negative. Low-grade fever of 99.2, hypotensive with systolic in the [id number]. Creatinine 1.2.      Review of Systems: negative for the following  Constitutional: Denies fever, chills, diaphoresis, appetite change and fatigue.  HEENT: Denies photophobia, eye pain, redness, hearing loss, ear pain, congestion, sore throat, rhinorrhea, sneezing, mouth sores, trouble swallowing, neck pain, neck stiffness and tinnitus.  Respiratory: Denies SOB, DOE, cough, chest tightness, and wheezing.  Cardiovascular: Denies chest pain, palpitations and leg swelling.  Gastrointestinal: Denies nausea, vomiting, abdominal pain, diarrhea, constipation, blood in stool and abdominal distention.  Genitourinary: Denies dysuria, urgency, frequency, hematuria, flank pain and difficulty urinating.  Musculoskeletal: Denies myalgias, back pain, joint swelling, arthralgias and gait problem.  Skin: Denies pallor, rash  and wound.  Neurological: Denies dizziness, seizures, syncope, positive for weakness, light-headedness, numbness and headaches.  Hematological: Denies adenopathy. Easy bruising, personal or family bleeding history  Psychiatric/Behavioral: Denies suicidal ideation, mood changes, confusion, nervousness, sleep disturbance and agitation       Past Medical History  Diagnosis Date  . Diabetes mellitus   . Hyperlipidemia   . Osteoarthritis   . Osteoporosis   . Hypertension   . Obesity   . Upper GI bleed     AV malformation/when anticoag  . Sacroiliac joint dysfunction 04/01/2012     Past Surgical History  Procedure Laterality Date  . Appendectomy    . Cholecystectomy    . Colon resection  1988    secondary to cancer  . Polypectomy  1998    benign X 2  . Cervicitis  1964    conization   . Total knee arthroplasty  07/2009    right   . Endometrial polyp  05/2000    hyperplasia-laser treatment  . Joint replacement      Rt knee replacement      Social History:  reports that she quit smoking about 53 years ago. Her smoking use included Cigarettes. She has never used smokeless tobacco. She reports that she does not drink alcohol or use illicit drugs.    Allergies  Allergen Reactions  . Diclofenac Sodium Other (See Comments)    : vomiting  . Fluoxetine Hcl Other (See Comments)     vomiting  . Hydrocodone     Nausea and vomiting   . Shellfish Allergy Nausea And Vomiting    Just feels "sick'    Family History  Problem Relation Age of Onset  . Stroke Father   .  Cancer Brother     lung         Prior to Admission medications   Medication Sig Start Date End Date Taking? Authorizing Provider  acetaminophen (TYLENOL) 500 MG tablet Take 1,000 mg by mouth every 4 (four) hours as needed.   Yes Historical Provider, MD  amLODipine (NORVASC) 5 MG tablet TAKE 1 TABLET BY MOUTH DAILY 05/19/15  Yes Abner Greenspan, MD  aspirin 325 MG tablet Take 325 mg by mouth daily.   Yes  Historical Provider, MD  calcium carbonate (OS-CAL) 600 MG TABS tablet Take 600 mg by mouth 2 (two) times daily with a meal.   Yes Historical Provider, MD  gabapentin (NEURONTIN) 300 MG capsule TAKE ONE OR TWO CAPSULES BY MOUTH UP TO 3 TIMES A DAY AS DIRECTED. 08/08/15  Yes Abner Greenspan, MD  imipramine (TOFRANIL) 50 MG tablet TAKE 2 TABLETS BY MOUTH AT BEDTIME. 07/30/15  Yes Abner Greenspan, MD  metFORMIN (GLUCOPHAGE) 500 MG tablet TAKE 1 TABLET BY MOUTH TWICE A DAY WITH MEALS 07/30/15  Yes Abner Greenspan, MD  metoprolol (LOPRESSOR) 50 MG tablet TAKE TWO (2) TABLETS BY MOUTH 2 TIMES DAILY 01/14/15  Yes Abner Greenspan, MD  Multiple Vitamins-Minerals (MULTIVITAMIN WITH MINERALS) tablet Take 1 tablet by mouth daily.   Yes Historical Provider, MD  omeprazole (PRILOSEC) 20 MG capsule TAKE ONE (1) CAPSULE BY MOUTH EACH DAY 08/08/15  Yes Abner Greenspan, MD  sennosides-docusate sodium (SENOKOT-S) 8.6-50 MG tablet Take 2 tablets by mouth 2 (two) times daily.   Yes Historical Provider, MD  vitamin C (ASCORBIC ACID) 500 MG tablet Take 500 mg by mouth daily.   Yes Historical Provider, MD  atorvastatin (LIPITOR) 10 MG tablet TAKE 1 TABLET BY MOUTH DAILY 08/08/15   Abner Greenspan, MD  cephALEXin (KEFLEX) 500 MG capsule Take 1 capsule (500 mg total) by mouth 4 (four) times daily. 08/25/15   Pattricia Boss, MD     Physical Exam: Filed Vitals:   08/25/15 1315 08/25/15 1330 08/25/15 1400 08/25/15 1706  BP: 151/63 181/78 166/75   Pulse: 107 98    Temp:    99.2 F (37.3 C)  TempSrc:    Rectal  Resp: 20 17 21    SpO2: 99% 99%        Constitutional: NAD, calm, comfortable Filed Vitals:   08/25/15 1315 08/25/15 1330 08/25/15 1400 08/25/15 1706  BP: 151/63 181/78 166/75   Pulse: 107 98    Temp:    99.2 F (37.3 C)  TempSrc:    Rectal  Resp: 20 17 21    SpO2: 99% 99%     Eyes: PERRL, lids and conjunctivae normal ENMT: Mucous membranes are moist. Posterior pharynx clear of any exudate or lesions.Normal dentition.   Neck: normal, supple, no masses, no thyromegaly Respiratory: clear to auscultation bilaterally, no wheezing, no crackles. Normal respiratory effort. No accessory muscle use.  Cardiovascular: Regular rate and rhythm, no murmurs / rubs / gallops. No extremity edema. 2+ pedal pulses. No carotid bruits.  Abdomen: no tenderness, no masses palpated. No hepatosplenomegaly. Bowel sounds positive. Mild diffuse tenderness to palpation  Musculoskeletal: no clubbing / cyanosis. No joint deformity upper and lower extremities. Good ROM, no contractures. Normal muscle tone. Mild bilateral hip tenderness palpation  Skin: no rashes, lesions, ulcers. No induration Neurologic: CN 2-12 grossly intact. Sensation intact, DTR normal. Strength 5/5 in all 4.  Psychiatric: Normal judgment and insight. Alert and oriented x 3. Normal mood.  Labs on Admission: I have personally reviewed following labs and imaging studies  CBC:  Recent Labs Lab 08/25/15 1220  WBC 7.3  NEUTROABS 4.5  HGB 13.8  HCT 41.6  MCV 88.7  PLT [id number]    Basic Metabolic Panel:  Recent Labs Lab 08/25/15 1228  NA 139  K 3.5  CL 100*  CO2 25  GLUCOSE 103*  BUN 9  CREATININE 1.20*  CALCIUM 10.3    GFR: CrCl cannot be calculated (Unknown ideal weight.).  Liver Function Tests: No results for input(s): AST, ALT, ALKPHOS, BILITOT, PROT, ALBUMIN in the last 168 hours. No results for input(s): LIPASE, AMYLASE in the last 168 hours. No results for input(s): AMMONIA in the last 168 hours.  Coagulation Profile: No results for input(s): INR, PROTIME in the last 168 hours. No results for input(s): DDIMER in the last 72 hours.  Cardiac Enzymes: No results for input(s): CKTOTAL, CKMB, CKMBINDEX, TROPONINI in the last 168 hours.  BNP (last 3 results) No results for input(s): PROBNP in the last 8760 hours.  HbA1C: No results for input(s): HGBA1C in the last 72 hours. Lab Results  Component Value Date   HGBA1C 6.3 07/23/2015    HGBA1C 6.5 01/20/2015   HGBA1C 6.6* 07/19/2014     CBG: No results for input(s): GLUCAP in the last 168 hours.  Lipid Profile: No results for input(s): CHOL, HDL, LDLCALC, TRIG, CHOLHDL, LDLDIRECT in the last 72 hours.  Thyroid Function Tests: No results for input(s): TSH, T4TOTAL, FREET4, T3FREE, THYROIDAB in the last 72 hours.  Anemia Panel: No results for input(s): VITAMINB12, FOLATE, FERRITIN, TIBC, IRON, RETICCTPCT in the last 72 hours.  Urine analysis:    Component Value Date/Time   COLORURINE YELLOW 08/25/2015 1225   APPEARANCEUR CLOUDY* 08/25/2015 1225   LABSPEC 1.008 08/25/2015 1225   PHURINE 6.5 08/25/2015 1225   GLUCOSEU NEGATIVE 08/25/2015 1225   HGBUR MODERATE* 08/25/2015 1225   HGBUR large 03/18/2009 1613   BILIRUBINUR NEGATIVE 08/25/2015 1225   BILIRUBINUR negative 01/23/2014 1823   KETONESUR 15* 08/25/2015 1225   PROTEINUR 30* 08/25/2015 1225   PROTEINUR trace 01/23/2014 1823   UROBILINOGEN 0.2 01/23/2014 1823   UROBILINOGEN 0.2 01/03/2014 2002   NITRITE NEGATIVE 08/25/2015 1225   NITRITE negative 01/23/2014 1823   LEUKOCYTESUR LARGE* 08/25/2015 1225      )       Radiological Exams on Admission: Dg Chest 1 View  08/25/2015  CLINICAL DATA:  Altered mental status for 2 days EXAM: CHEST 1 VIEW COMPARISON:  April 16, 2012 FINDINGS: There is slight scarring in the right apex. Lungs elsewhere are clear. Heart size and pulmonary vascularity are normal. No adenopathy. No pneumothorax. No bone lesions. IMPRESSION: Mild scarring right apex.  No edema or consolidation. Electronically Signed   By: Lowella Grip III M.D.   On: 08/25/2015 13:04   Dg Pelvis 1-2 Views  08/25/2015  CLINICAL DATA:  Pain.  Altered mental status EXAM: PELVIS - 1-2 VIEW COMPARISON:  None. FINDINGS: There is no evidence of pelvic fracture or dislocation. There is mild symmetric narrowing of both hip joints. There is degenerative change in the visualized lumbar spine. No erosive  change. IMPRESSION: Areas of osteoarthritic change.  No fracture or dislocation. Electronically Signed   By: Lowella Grip III M.D.   On: 08/25/2015 13:05   Dg Abd 1 View  08/25/2015  CLINICAL DATA:  Abdominal pain and altered mental status EXAM: ABDOMEN - 1 VIEW COMPARISON:  CT abdomen and pelvis January 03, 2014 FINDINGS: There is fairly diffuse stool throughout the colon. There is no bowel dilatation or air-fluid level suggesting obstruction. No free air is evident on this supine examination. There is degenerative change in the lumbar spine. IMPRESSION: Fairly diffuse stool throughout colon. Bowel gas pattern unremarkable. No demonstrable obstruction or free air. Electronically Signed   By: Lowella Grip III M.D.   On: 08/25/2015 13:05   Ct Head Wo Contrast  08/25/2015  CLINICAL DATA:  Altered mental status for 3 days, initial encounter EXAM: CT HEAD WITHOUT CONTRAST TECHNIQUE: Contiguous axial images were obtained from the base of the skull through the vertex without intravenous contrast. COMPARISON:  04/03/2012 FINDINGS: Bony calvarium is intact. No gross soft tissue abnormality is noted. Mild atrophic and chronic white matter ischemic changes are seen. No findings to suggest acute hemorrhage, acute infarction or space-occupying mass lesion are noted. IMPRESSION: Chronic atrophic and ischemic changes.  No acute abnormality noted. Electronically Signed   By: Inez Catalina M.D.   On: 08/25/2015 14:45   Dg Chest 1 View  08/25/2015  CLINICAL DATA:  Altered mental status for 2 days EXAM: CHEST 1 VIEW COMPARISON:  April 16, 2012 FINDINGS: There is slight scarring in the right apex. Lungs elsewhere are clear. Heart size and pulmonary vascularity are normal. No adenopathy. No pneumothorax. No bone lesions. IMPRESSION: Mild scarring right apex.  No edema or consolidation. Electronically Signed   By: Lowella Grip III M.D.   On: 08/25/2015 13:04   Dg Pelvis 1-2 Views  08/25/2015  CLINICAL DATA:   Pain.  Altered mental status EXAM: PELVIS - 1-2 VIEW COMPARISON:  None. FINDINGS: There is no evidence of pelvic fracture or dislocation. There is mild symmetric narrowing of both hip joints. There is degenerative change in the visualized lumbar spine. No erosive change. IMPRESSION: Areas of osteoarthritic change.  No fracture or dislocation. Electronically Signed   By: Lowella Grip III M.D.   On: 08/25/2015 13:05   Dg Abd 1 View  08/25/2015  CLINICAL DATA:  Abdominal pain and altered mental status EXAM: ABDOMEN - 1 VIEW COMPARISON:  CT abdomen and pelvis January 03, 2014 FINDINGS: There is fairly diffuse stool throughout the colon. There is no bowel dilatation or air-fluid level suggesting obstruction. No free air is evident on this supine examination. There is degenerative change in the lumbar spine. IMPRESSION: Fairly diffuse stool throughout colon. Bowel gas pattern unremarkable. No demonstrable obstruction or free air. Electronically Signed   By: Lowella Grip III M.D.   On: 08/25/2015 13:05   Ct Head Wo Contrast  08/25/2015  CLINICAL DATA:  Altered mental status for 3 days, initial encounter EXAM: CT HEAD WITHOUT CONTRAST TECHNIQUE: Contiguous axial images were obtained from the base of the skull through the vertex without intravenous contrast. COMPARISON:  04/03/2012 FINDINGS: Bony calvarium is intact. No gross soft tissue abnormality is noted. Mild atrophic and chronic white matter ischemic changes are seen. No findings to suggest acute hemorrhage, acute infarction or space-occupying mass lesion are noted. IMPRESSION: Chronic atrophic and ischemic changes.  No acute abnormality noted. Electronically Signed   By: Inez Catalina M.D.   On: 08/25/2015 14:45      EKG: Independently reviewed. Sinus tach   Assessment/Plan Principal Problem:   UTI (lower urinary tract infection)-patient started on Rocephin, low-grade fever and recent onset of symptoms consistent with   urinary tract  infection that needs treatment Follow urine culture, blood culture results  Acute encephalopathy, metabolic/neurologic? Need to rule out CVA  given slow speech, gait disturbance, sluggishness MRI, 2-D echo, carotid Doppler, aspirin rectally PT OT evaluation Lipid panel     Hyperlipidemia LDL goal <100-repeat lipid panel and continue statin    Essential hypertension/hypertensive in the ER, use prn hydralazine for systolic greater than 0000000 and po metoprolol    DM2 (diabetes mellitus, type 2) (HCC)-check hemoglobin A1c    Risk for falls-fall precautions       DVT prophylaxis:  lovenox      Code Status Orders        Start     Ordered   08/25/15 1656  Full code   Continuous     08/25/15 1656    Code Status History    Date Active Date Inactive Code Status Order ID Comments User Context   04/16/2012  5:44 AM 04/18/2012  9:21 PM DNR [id number]  Grier Mitts, RN Inpatient   04/07/2012 11:51 PM 04/10/2012 10:56 PM DNR [id number]  Eber Jones, RN Inpatient      Family Communication: discussed with patient's .neice,POA .Marland KitchenSlade,[name].... By the bedside   Disposition Plan:  Anticipate discharge in 2-3 days pending further workup and clinical progress  Consults called: none   Admission status: inpatient   Total time spent 55 minutes.Greater than 50% of this time was spent in counseling, explanation of diagnosis, planning of further management, and coordination of care  [facility] MD [facility] Pager [phone]  If 7PM-7AM, please contact night-coverage [email or web address] Password [facility]  08/25/2015, 5:48 PM

## 2015-08-25 NOTE — Telephone Encounter (Signed)
Zigmund Daniel slade called back to let you know that she has called ems and is waiting for them to arrive to take ms Guillet to [facility] number for kay [phone]

## 2015-08-25 NOTE — Telephone Encounter (Signed)
[facility]     Patient Name: Erin [name] Initial Comment Pt is disoriented and she is not able to hold a conversation. She seems very out of sorts  DOB: 1928/04/21      Nurse Assessment  Nurse: Luther Parody, RN, Malachy Mood Date/Time (Eastern Time): 08/25/2015 9:02:13 AM  Confirm and document reason for call. If symptomatic, describe symptoms. You must click the next button to save text entered. ---Caller states that her aunt started becoming disoriented over the weekend. She was not with her but was getting reports from those who had been carrying for her. States that she is not very verbal and they are having a difficult time getting her up for her daily activities. She is not with her this am but is going over to see her right now.  Has the patient traveled out of the country within the last 30 days? ---Not Applicable  Does the patient have any new or worsening symptoms? ---Yes  Will a triage be completed? ---Yes  Related visit to physician within the last 2 weeks? ---No  Does the PT have any chronic conditions? (i.e. diabetes, asthma, etc.) ---Yes  List chronic conditions. ---htn  Is this a behavioral health or substance abuse call? ---No    Guidelines     Guideline Title Affirmed Question Affirmed Notes   Confusion - Delirium [1] Difficult to awaken or acting confused (disoriented, slurred speech) AND [2] present now AND [3] new onset    Final Disposition User   Call EMS 911 Now Luther Parody, RN, Malachy Mood       Disagree/Comply: Comply

## 2015-08-26 ENCOUNTER — Inpatient Hospital Stay (HOSPITAL_COMMUNITY): Payer: Medicare Other

## 2015-08-26 DIAGNOSIS — R079 Chest pain, unspecified: Secondary | ICD-10-CM

## 2015-08-26 DIAGNOSIS — I639 Cerebral infarction, unspecified: Secondary | ICD-10-CM

## 2015-08-26 LAB — GLUCOSE, CAPILLARY
GLUCOSE-CAPILLARY: 91 mg/dL (ref 65–99)
GLUCOSE-CAPILLARY: 107 mg/dL — AB (ref 65–99)
Glucose-Capillary: 102 mg/dL — ABNORMAL HIGH (ref 65–99)
Glucose-Capillary: 86 mg/dL (ref 65–99)

## 2015-08-26 LAB — COMPREHENSIVE METABOLIC PANEL
ALK PHOS: 56 U/L (ref 38–126)
ALT: 14 U/L (ref 14–54)
ANION GAP: 14 (ref 5–15)
AST: 28 U/L (ref 15–41)
Albumin: 3.6 g/dL (ref 3.5–5.0)
BILIRUBIN TOTAL: 0.9 mg/dL (ref 0.3–1.2)
BUN: 10 mg/dL (ref 6–20)
CALCIUM: 9.4 mg/dL (ref 8.9–10.3)
CO2: 21 mmol/L — ABNORMAL LOW (ref 22–32)
Chloride: 105 mmol/L (ref 101–111)
Creatinine, Ser: 1.08 mg/dL — ABNORMAL HIGH (ref 0.44–1.00)
GFR, EST AFRICAN AMERICAN: 53 mL/min — AB (ref >60)
GFR, EST NON AFRICAN AMERICAN: 45 mL/min — AB (ref >60)
GLUCOSE: 97 mg/dL (ref 65–99)
POTASSIUM: 3.5 mmol/L (ref 3.5–5.1)
Sodium: 140 mmol/L (ref 135–145)
TOTAL PROTEIN: 7.5 g/dL (ref 6.5–8.1)

## 2015-08-26 LAB — LIPID PANEL
Cholesterol: 128 mg/dL (ref 0–200)
HDL: 42 mg/dL (ref >40)
LDL CALC: 69 mg/dL (ref 0–99)
TRIGLYCERIDES: 86 mg/dL (ref <150)
Total CHOL/HDL Ratio: 3 RATIO
VLDL: 17 mg/dL (ref 0–40)

## 2015-08-26 LAB — CBC
HEMATOCRIT: 39.1 % (ref 36.0–46.0)
HEMOGLOBIN: 12.5 g/dL (ref 12.0–15.0)
MCH: 28.3 pg (ref 26.0–34.0)
MCHC: 32 g/dL (ref 30.0–36.0)
MCV: 88.7 fL (ref 78.0–100.0)
Platelets: 157 10*3/uL (ref 150–400)
RBC: 4.41 MIL/uL (ref 3.87–5.11)
RDW: 14.3 % (ref 11.5–15.5)
WBC: 7.3 10*3/uL (ref 4.0–10.5)

## 2015-08-26 LAB — TROPONIN I
TROPONIN I: 0.04 ng/mL — AB (ref <0.031)
Troponin I: 0.05 ng/mL — ABNORMAL HIGH (ref <0.031)

## 2015-08-26 LAB — ECHOCARDIOGRAM COMPLETE: Weight: 2994.73 oz

## 2015-08-26 LAB — HEMOGLOBIN A1C
HEMOGLOBIN A1C: 6.2 % — AB (ref 4.8–5.6)
MEAN PLASMA GLUCOSE: 131 mg/dL

## 2015-08-26 NOTE — Progress Notes (Signed)
[facility] PROGRESS NOTE  EVEREST LOO [id number] DOB: [birth year] DOA: 08/25/2015   PCP: [name], MD     Assessment/Plan: Principal Problem:   UTI (lower urinary tract infection) Active Problems:   Hyperlipidemia LDL goal <100   Essential hypertension   DM2 (diabetes mellitus, type 2) (HCC)   Risk for falls   82 year old female with a history of diabetes, dyslipidemia, hypertension, morbid obesity, fairly independent at home, and living alone with her niece who is her POA and her caregiver, brought in today to the ER because of lethargy, mental slowness, poor appetite. The patient was last seen normal almost a week ago. Patient's niece who currently provides most of the history states that the patient had some nausea and dry heaving on Tuesday, subsequently developed urinary incontinence and Thursday. She has been extremely slow with her speech and gait and unable to get out of bed. Patient is able to speak but her speech is extremely delayed. Patient is on aspirin 325 mg a day. No recent history of falls. In the ER the CT of the head does not show any acute abnormality. The patient does have a mild urinary tract infection. Chest x-ray negative. Low-grade fever of 99.2, hypotensive with systolic in the [id number]. Creatinine 1.2.  Assessment and plan UTI (lower urinary tract infection)-patient started on Rocephin, low-grade fever and recent onset of symptoms consistent with urinary tract infection that needs treatment Follow urine culture, blood culture results  Acute encephalopathy, metabolic/neurologic? 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,  2-D echo, carotid Doppler, aspirin   PT OT evaluation, speech therapy evaluation Lipid panel-LDL 69    Hyperlipidemia LDL goal <100-LDL 69, continue statin   Essential hypertension/hypertensive overnight, use prn  hydralazine for systolic greater than 0000000 and po metoprolol   DM2 (diabetes mellitus, type 2) (HCC)-hemoglobin A1c 6.2   Risk for falls-fall precautions   DVT prophylaxsis Lovenox  Code Status:  Full code   Family Communication: Discussed in detail with the patient's niece, all imaging results, lab results explained to the patient   Disposition Plan:  Anticipate discharge tomorrow    Consultants:  None  Procedures:  None  Antibiotics: Rocephin-5/15       HPI/Subjective: Patient still confused and slow with her speech  Objective: Filed Vitals:   08/25/15 2049 08/26/15 0236 08/26/15 0450 08/26/15 0930  BP: 167/70  148/67 170/57  Pulse: 98  105 102  Temp: 98.4 F (36.9 C) 98.6 F (37 C) 98.2 F (36.8 C) 98.2 F (36.8 C)  TempSrc: Oral  Axillary Oral  Resp: 18  19 17   Weight: 84.9 kg (187 lb 2.7 oz)     SpO2: 99%  93% 98%    Intake/Output Summary (Last 24 hours) at 08/26/15 0949 Last data filed at 08/26/15 0929  Gross per 24 hour  Intake   1200 ml  Output      0 ml  Net   1200 ml    Exam:  Examination:  General exam: Appears calm and comfortable  Respiratory system: Clear to auscultation. Respiratory effort normal. Cardiovascular system: S1 & S2 heard, RRR. No JVD, murmurs, rubs, gallops or clicks. No pedal edema. Gastrointestinal system: Abdomen is nondistended, soft and nontender. No organomegaly or masses felt. Normal bowel sounds heard. Central nervous system: Alert and oriented. No focal neurological deficits. Extremities: Symmetric 5 x 5 power. Skin: No  rashes, lesions or ulcers Psychiatry: Judgement and insight appear normal. Mood & affect appropriate.     Data Reviewed: I have personally reviewed following labs and imaging studies  Micro Results No results found for this or any previous visit (from the past 240 hour(s)).  Radiology Reports Dg Chest 1 View  08/25/2015  CLINICAL DATA:  Altered mental status for 2 days EXAM: CHEST 1  VIEW COMPARISON:  April 16, 2012 FINDINGS: There is slight scarring in the right apex. Lungs elsewhere are clear. Heart size and pulmonary vascularity are normal. No adenopathy. No pneumothorax. No bone lesions. IMPRESSION: Mild scarring right apex.  No edema or consolidation. Electronically Signed   By: Lowella Grip III M.D.   On: 08/25/2015 13:04   Dg Pelvis 1-2 Views  08/25/2015  CLINICAL DATA:  Pain.  Altered mental status EXAM: PELVIS - 1-2 VIEW COMPARISON:  None. FINDINGS: There is no evidence of pelvic fracture or dislocation. There is mild symmetric narrowing of both hip joints. There is degenerative change in the visualized lumbar spine. No erosive change. IMPRESSION: Areas of osteoarthritic change.  No fracture or dislocation. Electronically Signed   By: Lowella Grip III M.D.   On: 08/25/2015 13:05   Dg Abd 1 View  08/25/2015  CLINICAL DATA:  Abdominal pain and altered mental status EXAM: ABDOMEN - 1 VIEW COMPARISON:  CT abdomen and pelvis January 03, 2014 FINDINGS: There is fairly diffuse stool throughout the colon. There is no bowel dilatation or air-fluid level suggesting obstruction. No free air is evident on this supine examination. There is degenerative change in the lumbar spine. IMPRESSION: Fairly diffuse stool throughout colon. Bowel gas pattern unremarkable. No demonstrable obstruction or free air. Electronically Signed   By: Lowella Grip III M.D.   On: 08/25/2015 13:05   Ct Head Wo Contrast  08/25/2015  CLINICAL DATA:  Altered mental status for 3 days, initial encounter EXAM: CT HEAD WITHOUT CONTRAST TECHNIQUE: Contiguous axial images were obtained from the base of the skull through the vertex without intravenous contrast. COMPARISON:  04/03/2012 FINDINGS: Bony calvarium is intact. No gross soft tissue abnormality is noted. Mild atrophic and chronic white matter ischemic changes are seen. No findings to suggest acute hemorrhage, acute infarction or space-occupying mass  lesion are noted. IMPRESSION: Chronic atrophic and ischemic changes.  No acute abnormality noted. Electronically Signed   By: Inez Catalina M.D.   On: 08/25/2015 14:45   Mr Brain Wo Contrast  08/26/2015  CLINICAL DATA:  Lethargy, mental slowness, last seen normal 1 week ago. Delayed speech. History of diabetes, hypertension, hyperlipidemia. EXAM: MRI HEAD WITHOUT CONTRAST TECHNIQUE: Multiplanar, multiecho pulse sequences of the brain and surrounding structures were obtained without intravenous contrast. Coronal diffusion weighted imaging, axial of T1 and coronal T2 sequences not obtained due to patient's inability to remain still. COMPARISON:  CT HEAD Aug 25, 2015 at 1427 hours FINDINGS: Sequences are moderately or severely motion degraded. INTRACRANIAL CONTENTS: No reduced diffusion to suggest acute ischemia. No large area susceptibility artifact though, axial SWAN sequence is severely motion degraded. The ventricles and sulci are normal for patient's age. No abnormal parenchymal signal, mass lesions, mass effect. Patchy to confluent supratentorial and pontine white matter FLAIR T2 hyperintensities. No abnormal extra-axial fluid collections. No extra-axial masses though, contrast enhanced sequences would be more sensitive. Normal major intracranial vascular flow voids present at skull base. Asymmetric fullness RIGHT carotid terminus inferiorly directed measuring up to 6 mm. ORBITS: The included ocular globes and orbital contents are non-suspicious.  SINUSES: The mastoid air-cells and included paranasal sinuses are well-aerated. SKULL/SOFT TISSUES: No abnormal sellar expansion. No suspicious calvarial bone marrow signal. Craniocervical junction maintained. IMPRESSION: Limited motion degraded abbreviated MRI of the brain. No acute ischemia. Moderate to severe white matter changes compatible chronic small vessel ischemic disease. Possible 6 mm RIGHT carotid terminus aneurysm would be better characterized on MRA  versus CTA HEAD on a nonemergent basis. Electronically Signed   By: Elon Alas M.D.   On: 08/26/2015 01:55     CBC  Recent Labs Lab 08/25/15 1220 08/26/15 0211  WBC 7.3 7.3  HGB 13.8 12.5  HCT 41.6 39.1  PLT 166 157  MCV 88.7 88.7  MCH 29.4 28.3  MCHC 33.2 32.0  RDW 14.4 14.3  LYMPHSABS 2.0  --   MONOABS 0.7  --   EOSABS 0.1  --   BASOSABS 0.0  --     Chemistries   Recent Labs Lab 08/25/15 1228 08/25/15 1718 08/26/15 0211  NA 139  --  140  K 3.5  --  3.5  CL 100*  --  105  CO2 25  --  21*  GLUCOSE 103*  --  97  BUN 9  --  10  CREATININE 1.20*  --  1.08*  CALCIUM 10.3  --  9.4  MG  --  1.3*  --   AST  --  28 28  ALT  --  14 14  ALKPHOS  --  57 56  BILITOT  --  1.1 0.9   ------------------------------------------------------------------------------------------------------------------ CrCl cannot be calculated (Unknown ideal weight.). ------------------------------------------------------------------------------------------------------------------  Recent Labs  08/25/15 1719  HGBA1C 6.2*   ------------------------------------------------------------------------------------------------------------------  Recent Labs  08/26/15 0211  CHOL 128  HDL 42  LDLCALC 69  TRIG 86  CHOLHDL 3.0   ------------------------------------------------------------------------------------------------------------------  Recent Labs  08/25/15 1719  TSH 1.216   ------------------------------------------------------------------------------------------------------------------ No results for input(s): VITAMINB12, FOLATE, FERRITIN, TIBC, IRON, RETICCTPCT in the last 72 hours.  Coagulation profile  Recent Labs Lab 08/25/15 1808  INR 1.13    No results for input(s): DDIMER in the last 72 hours.  Cardiac Enzymes  Recent Labs Lab 08/25/15 1901 08/25/15 2031 08/26/15 0211  TROPONINI 0.04* 0.04* 0.04*    ------------------------------------------------------------------------------------------------------------------ Invalid input(s): POCBNP   CBG:  Recent Labs Lab 08/25/15 1834 08/25/15 2206 08/26/15 0859  GLUCAP 97 107* 91       Studies: Dg Chest 1 View  08/25/2015  CLINICAL DATA:  Altered mental status for 2 days EXAM: CHEST 1 VIEW COMPARISON:  April 16, 2012 FINDINGS: There is slight scarring in the right apex. Lungs elsewhere are clear. Heart size and pulmonary vascularity are normal. No adenopathy. No pneumothorax. No bone lesions. IMPRESSION: Mild scarring right apex.  No edema or consolidation. Electronically Signed   By: Lowella Grip III M.D.   On: 08/25/2015 13:04   Dg Pelvis 1-2 Views  08/25/2015  CLINICAL DATA:  Pain.  Altered mental status EXAM: PELVIS - 1-2 VIEW COMPARISON:  None. FINDINGS: There is no evidence of pelvic fracture or dislocation. There is mild symmetric narrowing of both hip joints. There is degenerative change in the visualized lumbar spine. No erosive change. IMPRESSION: Areas of osteoarthritic change.  No fracture or dislocation. Electronically Signed   By: Lowella Grip III M.D.   On: 08/25/2015 13:05   Dg Abd 1 View  08/25/2015  CLINICAL DATA:  Abdominal pain and altered mental status EXAM: ABDOMEN - 1 VIEW COMPARISON:  CT abdomen and pelvis January 03, 2014  FINDINGS: There is fairly diffuse stool throughout the colon. There is no bowel dilatation or air-fluid level suggesting obstruction. No free air is evident on this supine examination. There is degenerative change in the lumbar spine. IMPRESSION: Fairly diffuse stool throughout colon. Bowel gas pattern unremarkable. No demonstrable obstruction or free air. Electronically Signed   By: Lowella Grip III M.D.   On: 08/25/2015 13:05   Ct Head Wo Contrast  08/25/2015  CLINICAL DATA:  Altered mental status for 3 days, initial encounter EXAM: CT HEAD WITHOUT CONTRAST TECHNIQUE:  Contiguous axial images were obtained from the base of the skull through the vertex without intravenous contrast. COMPARISON:  04/03/2012 FINDINGS: Bony calvarium is intact. No gross soft tissue abnormality is noted. Mild atrophic and chronic white matter ischemic changes are seen. No findings to suggest acute hemorrhage, acute infarction or space-occupying mass lesion are noted. IMPRESSION: Chronic atrophic and ischemic changes.  No acute abnormality noted. Electronically Signed   By: Inez Catalina M.D.   On: 08/25/2015 14:45   Mr Brain Wo Contrast  08/26/2015  CLINICAL DATA:  Lethargy, mental slowness, last seen normal 1 week ago. Delayed speech. History of diabetes, hypertension, hyperlipidemia. EXAM: MRI HEAD WITHOUT CONTRAST TECHNIQUE: Multiplanar, multiecho pulse sequences of the brain and surrounding structures were obtained without intravenous contrast. Coronal diffusion weighted imaging, axial of T1 and coronal T2 sequences not obtained due to patient's inability to remain still. COMPARISON:  CT HEAD Aug 25, 2015 at 1427 hours FINDINGS: Sequences are moderately or severely motion degraded. INTRACRANIAL CONTENTS: No reduced diffusion to suggest acute ischemia. No large area susceptibility artifact though, axial SWAN sequence is severely motion degraded. The ventricles and sulci are normal for patient's age. No abnormal parenchymal signal, mass lesions, mass effect. Patchy to confluent supratentorial and pontine white matter FLAIR T2 hyperintensities. No abnormal extra-axial fluid collections. No extra-axial masses though, contrast enhanced sequences would be more sensitive. Normal major intracranial vascular flow voids present at skull base. Asymmetric fullness RIGHT carotid terminus inferiorly directed measuring up to 6 mm. ORBITS: The included ocular globes and orbital contents are non-suspicious. SINUSES: The mastoid air-cells and included paranasal sinuses are well-aerated. SKULL/SOFT TISSUES: No  abnormal sellar expansion. No suspicious calvarial bone marrow signal. Craniocervical junction maintained. IMPRESSION: Limited motion degraded abbreviated MRI of the brain. No acute ischemia. Moderate to severe white matter changes compatible chronic small vessel ischemic disease. Possible 6 mm RIGHT carotid terminus aneurysm would be better characterized on MRA versus CTA HEAD on a nonemergent basis. Electronically Signed   By: Elon Alas M.D.   On: 08/26/2015 01:55      Lab Results  Component Value Date   HGBA1C 6.2* 08/25/2015   HGBA1C 6.3 07/23/2015   HGBA1C 6.5 01/20/2015   Lab Results  Component Value Date   MICROALBUR 1.7 01/23/2014   LDLCALC 69 08/26/2015   CREATININE 1.08* 08/26/2015       Scheduled Meds: .  stroke: mapping our early stages of recovery book   Does not apply Once  . aspirin  300 mg Rectal Daily  . aspirin  325 mg Oral Daily  . atorvastatin  10 mg Oral q1800  . cefTRIAXone (ROCEPHIN)  IV  1 g Intravenous Q24H  . docusate sodium  100 mg Oral BID  . enoxaparin (LOVENOX) injection  40 mg Subcutaneous Q24H  . insulin aspart  0-5 Units Subcutaneous QHS  . insulin aspart  0-9 Units Subcutaneous TID WC  . metoprolol  12.5 mg Oral BID  .  pantoprazole  40 mg Oral Daily  . polyethylene glycol  17 g Oral Daily  . sodium chloride flush  3 mL Intravenous Q12H   Continuous Infusions: . sodium chloride 100 mL/hr at 08/26/15 0737     LOS: 1 day    Time spent: >30 MINS    [facility]  [facility] Pager [phone]. If 7PM-7AM, please contact night-coverage at [email or web address], password [facility] 08/26/2015, 9:49 AM  LOS: 1 day

## 2015-08-26 NOTE — Evaluation (Signed)
Physical Therapy Evaluation Patient Details Name: Erin [name] MRN: [id number] DOB: [birth year] Today's Date: 08/26/2015   History of Present Illness  82 year old female with a history of diabetes, dyslipidemia, hypertension, morbid obesity, fairly independent at home, and living alone with her niece who is her POA and her caregiver, brought in today to the ER because of lethargy, mental slowness, poor appetite.  Clinical Impression   Pt admitted with above diagnosis. Pt currently with functional limitations due to the deficits listed below (see PT Problem List).  Pt will benefit from skilled PT to increase their independence and safety with mobility to allow discharge to the venue listed below.       Follow Up Recommendations SNF    Equipment Recommendations  Rolling walker with 5" wheels;3in1 (PT) (likely already has)    Recommendations for Other Services       Precautions / Restrictions Precautions Precautions: Fall Restrictions Weight Bearing Restrictions: No      Mobility  Bed Mobility Overal bed mobility: Needs Assistance Bed Mobility: Rolling;Sidelying to Sit Rolling: Mod assist Sidelying to sit: Max assist       General bed mobility comments: Mod assist with tactile cues to initiate, good reach for rail to roll with cue; Max assist to elevate trunk and use of bed pad to square off hips at EOB; stable in sitting once sitting up  Transfers Overall transfer level: Needs assistance   Transfers: Sit to/from Stand;Squat Pivot Transfers Sit to Stand: Max assist   Squat pivot transfers: Max assist     General transfer comment: initial attempt to stand to RW with Max assist and unable to clear hips from bed; anxious to shift weight forward for transfer; Performed quite dependent squat pivot transfer with max assist and knees blocked for stability  Ambulation/Gait                Stairs            Wheelchair Mobility    Modified Rankin (Stroke  Patients Only)       Balance Overall balance assessment: Needs assistance   Sitting balance-Leahy Scale: Fair       Standing balance-Leahy Scale: Zero                               Pertinent Vitals/Pain Pain Assessment: No/denies pain    Home Living Family/patient expects to be discharged to:: Private residence Living Arrangements: Children;Other relatives (neice and 35 yo son) Available Help at Discharge: Family Type of Home: Mobile home Home Access: Ramped entrance     Home Layout: One level Home Equipment: Grant - 2 wheels;Walker - 4 wheels;Cane - single point Additional Comments: Son is special needs and can asssit with housekeeping; Will need updated information re: home situation and PLOF and available assist (The above was gleaned from chart review -- will need verification)    Prior Function           Comments: Need more relaible info re: current home situation, available level of assist, and prior level of function     Hand Dominance        Extremity/Trunk Assessment   Upper Extremity Assessment: Defer to OT evaluation           Lower Extremity Assessment: Generalized weakness (strength grossly symmetrical; Bradykinetic throughout)         Communication   Communication: Expressive difficulties;Other (comment) (Extremely slow to answer questions)  Cognition Arousal/Alertness:  Awake/alert Behavior During Therapy: WFL for tasks assessed/performed;Flat affect Overall Cognitive Status: No family/caregiver present to determine baseline cognitive functioning Area of Impairment: Following commands;Problem solving       Following Commands: Follows one step commands inconsistently;Follows one step commands with increased time     Problem Solving: Slow processing;Decreased initiation;Requires verbal cues;Requires tactile cues;Difficulty sequencing      General Comments      Exercises        Assessment/Plan    PT Assessment  Patient needs continued PT services  PT Diagnosis Difficulty walking;Generalized weakness   PT Problem List Decreased strength;Decreased activity tolerance;Decreased balance;Decreased mobility;Decreased coordination;Decreased cognition;Decreased knowledge of use of DME;Decreased safety awareness;Decreased knowledge of precautions  PT Treatment Interventions DME instruction;Gait training;Functional mobility training;Therapeutic activities;Therapeutic exercise;Cognitive remediation;Patient/family education   PT Goals (Current goals can be found in the Care Plan section) Acute Rehab PT Goals Patient Stated Goal: Did not state PT Goal Formulation: Patient unable to participate in goal setting Time For Goal Achievement: 09/09/15 Potential to Achieve Goals: Good    Frequency Min 3X/week   Barriers to discharge        Co-evaluation               End of Session Equipment Utilized During Treatment: Gait belt Activity Tolerance: Patient tolerated treatment well Patient left: in chair;with call bell/phone within reach;with chair alarm set Nurse Communication: Mobility status;Need for lift equipment         Time: [id number] PT Time Calculation (min) (ACUTE ONLY): 20 min   Charges:   PT Evaluation $PT Eval Moderate Complexity: 1 Procedure     PT G Codes:        Roney Marion Hamff 08/26/2015, 10:36 AM  Roney Marion, PT  Acute Rehabilitation Services Pager [phone] Office [phone]

## 2015-08-26 NOTE — Evaluation (Signed)
Occupational Therapy Evaluation Patient Details Name: Erin [name] MRN: [id number] DOB: 10/12/1928 Today's Date: 08/26/2015    History of Present Illness 82 year old female with a history of diabetes, dyslipidemia, hypertension, morbid obesity, fairly independent at home, and living alone with her niece who is her POA and her caregiver, brought in today to the ER because of lethargy, mental slowness, poor appetite.   Clinical Impression   Pt reports ambulating with a walker and receiving assistance for IADL. She was not able to communicate how she was functioning with ADL prior to admission. Pt presents with slow mentation and response speed and significant weakness with fair sitting balance. She is dependent in ADL. Recommending SNF for rehab upon discharge. Will follow acutely.    Follow Up Recommendations  SNF;Supervision/Assistance - 24 hour    Equipment Recommendations   (defer to next venue)    Recommendations for Other Services       Precautions / Restrictions Precautions Precautions: Fall Restrictions Weight Bearing Restrictions: No      Mobility Bed Mobility Overal bed mobility: Needs Assistance Bed Mobility: Rolling;Sidelying to Sit;Sit to Supine Rolling: Mod assist Sidelying to sit: Max assist   Sit to supine: Max assist   General bed mobility comments: assist for all aspects  Transfers                 General transfer comment: did not attempt this visit    Balance     Sitting balance-Leahy Scale: Fair                                      ADL Overall ADL's : Needs assistance/impaired                                       General ADL Comments: Pt dependent in bathing, dressing, toileting. Demonstrated ability to use call button and to drink with a straw. Declined eating, stating she did not feel good.     Vision     Perception     Praxis      Pertinent Vitals/Pain Pain Assessment: No/denies pain      Hand Dominance Right   Extremity/Trunk Assessment Upper Extremity Assessment Upper Extremity Assessment: Generalized weakness (moves slowly, deliberately)   Lower Extremity Assessment Lower Extremity Assessment: Defer to PT evaluation       Communication Communication Communication: Expressive difficulties (very slow responses)   Cognition Arousal/Alertness: Awake/alert Behavior During Therapy: WFL for tasks assessed/performed Overall Cognitive Status: No family/caregiver present to determine baseline cognitive functioning Area of Impairment: Orientation;Following commands;Problem solving Orientation Level: Disoriented to;Situation     Following Commands: Follows one step commands inconsistently;Follows one step commands with increased time     Problem Solving: Slow processing;Decreased initiation;Requires verbal cues;Requires tactile cues;Difficulty sequencing     General Comments       Exercises       Shoulder Instructions      Home Living Family/patient expects to be discharged to:: Private residence Living Arrangements: Children;Other relatives (niece and son) Available Help at Discharge: Family Type of Home: Mobile home Home Access: Ramped entrance     Home Layout: One level     Bathroom Shower/Tub: Teacher, early years/pre: Standard     Home Equipment: Environmental consultant - 2 wheels;Walker - 4 wheels;Cane - single point;Grab bars - tub/shower;Shower  seat;Bedside commode   Additional Comments: Pt is a poor historian, no family available to confirm.      Prior Functioning/Environment Level of Independence: Needs assistance  Gait / Transfers Assistance Needed: reports using a RW ADL's / Homemaking Assistance Needed: Reports assist for IADL, does not drive.   Comments: Need more relaible info re: current home situation, available level of assist, and prior level of function    OT Diagnosis: Generalized weakness;Cognitive deficits   OT Problem  List: Decreased strength;Decreased activity tolerance;Impaired balance (sitting and/or standing);Decreased coordination;Decreased cognition;Decreased safety awareness;Decreased knowledge of use of DME or AE;Obesity;Impaired UE functional use   OT Treatment/Interventions: Self-care/ADL training;Therapeutic exercise;DME and/or AE instruction;Therapeutic activities;Patient/family education;Balance training;Cognitive remediation/compensation    OT Goals(Current goals can be found in the care plan section) Acute Rehab OT Goals Patient Stated Goal: Did not state OT Goal Formulation: With patient Time For Goal Achievement: 09/09/15 Potential to Achieve Goals: Fair ADL Goals Pt Will Perform Eating: sitting;with supervision Pt Will Perform Grooming: with supervision;sitting Pt Will Perform Upper Body Bathing: with min assist;sitting Pt Will Perform Upper Body Dressing: with min assist;sitting Pt Will Transfer to Toilet: with mod assist;stand pivot transfer;bedside commode Additional ADL Goal #1: Pt wil perform bed mobility with min assist in preparation for ADL at EOB.  OT Frequency: Min 2X/week   Barriers to D/C:            Co-evaluation              End of Session    Activity Tolerance: Patient tolerated treatment well Patient left: in bed;with call bell/phone within reach   Time: [id number] OT Time Calculation (min): 18 min Charges:  OT General Charges $OT Visit: 1 Procedure OT Evaluation $OT Eval Moderate Complexity: 1 Procedure G-Codes:    Malka So 08/26/2015, 4:07 PM [phone]

## 2015-08-26 NOTE — Progress Notes (Signed)
*  PRELIMINARY RESULTS* Vascular Ultrasound Carotid Duplex (Doppler) has been completed.  Preliminary findings: Bilateral: No significant (1-39%) ICA stenosis. Antegrade vertebral flow.   Landry Mellow, RDMS, RVT  08/26/2015, 1:34 PM

## 2015-08-26 NOTE — Progress Notes (Signed)
Late entry:  Unable to complete admission questions due to patient's confusion.  No family members at the bedside.

## 2015-08-26 NOTE — Progress Notes (Signed)
  Echocardiogram 2D Echocardiogram has been performed.  Erin [name] 08/26/2015, 1:59 PM

## 2015-08-27 ENCOUNTER — Telehealth: Payer: Self-pay | Admitting: *Deleted

## 2015-08-27 ENCOUNTER — Inpatient Hospital Stay (HOSPITAL_COMMUNITY): Payer: Medicare Other

## 2015-08-27 DIAGNOSIS — G9340 Encephalopathy, unspecified: Secondary | ICD-10-CM

## 2015-08-27 LAB — SEDIMENTATION RATE: SED RATE: 72 mm/h — AB (ref 0–22)

## 2015-08-27 LAB — VITAMIN B12: Vitamin B-12: 805 pg/mL (ref 180–914)

## 2015-08-27 LAB — URINE CULTURE

## 2015-08-27 LAB — GLUCOSE, CAPILLARY
GLUCOSE-CAPILLARY: 134 mg/dL — AB (ref 65–99)
Glucose-Capillary: 110 mg/dL — ABNORMAL HIGH (ref 65–99)
Glucose-Capillary: 105 mg/dL — ABNORMAL HIGH (ref 65–99)
Glucose-Capillary: 121 mg/dL — ABNORMAL HIGH (ref 65–99)

## 2015-08-27 LAB — CK: CK TOTAL: 118 U/L (ref 38–234)

## 2015-08-27 LAB — AMMONIA: AMMONIA: 24 umol/L (ref 9–35)

## 2015-08-27 MED ORDER — SODIUM CHLORIDE 0.9 % IV SOLN
INTRAVENOUS | Status: DC
  Administered 2015-08-27: 11:00:00 via INTRAVENOUS

## 2015-08-27 NOTE — Consult Note (Signed)
NEURO HOSPITALIST CONSULT NOTE   Requestig physician: Dr. Allyson Sabal   Reason for Consult: AMS in setting of possible infection   HPI:                                                                                                                                          Erin [name] is an 81 y.o. female fairly independent at home, and living alone with her niece who is her POA and her caregiver, brought in today to the ER because of lethargy, mental slowness, poor appetite. The patient was last seen normal almost a week ago. Patient's niece who currently provides most of the history states that the patient had some nausea and dry heaving on Tuesday, subsequently developed urinary incontinence and Thursday. She has been extremely slow with her speech and gait and unable to get out of bed. Patient was admitted and MRI brain was obtained showing Moderate to severe white matter changes compatible chronic small vessel ischemic disease but no infarct. She was noted to have significant amount of leukocytes in UA but Culture showed many morphologies and recollection was requested.   Per friend in the room she does not drive and she gets help at her home doing the bills from her daughter. She lives with her son who is mentally challenged. I could not get a good read on her baseline mental status but cannot rule out underlying cognitive decline.   Past Medical History  Diagnosis Date  . Diabetes mellitus   . Hyperlipidemia   . Osteoarthritis   . Osteoporosis   . Hypertension   . Obesity   . Upper GI bleed     AV malformation/when anticoag  . Sacroiliac joint dysfunction 04/01/2012    Past Surgical History  Procedure Laterality Date  . Appendectomy    . Cholecystectomy    . Colon resection  1988    secondary to cancer  . Polypectomy  1998    benign X 2  . Cervicitis  1964    conization   . Total knee arthroplasty  07/2009    right   . Endometrial polyp  05/2000     hyperplasia-laser treatment  . Joint replacement      Rt knee replacement    Family History  Problem Relation Age of Onset  . Stroke Father   . Cancer Brother     lung     Social History:  reports that she quit smoking about 53 years ago. Her smoking use included Cigarettes. She has never used smokeless tobacco. She reports that she does not drink alcohol or use illicit drugs.  Allergies  Allergen Reactions  . Fluoxetine Hcl Other (See Comments)     vomiting  . Hydrocodone     Nausea and vomiting   .  Shellfish Allergy Nausea And Vomiting    Just feels "sick'  . Diclofenac Sodium Other (See Comments)    : vomiting    MEDICATIONS:                                                                                                                     Prior to Admission:  Prescriptions prior to admission  Medication Sig Dispense Refill Last Dose  . acetaminophen (TYLENOL) 500 MG tablet Take 1,000 mg by mouth every 4 (four) hours as needed.   Past Week at Unknown time  . amLODipine (NORVASC) 5 MG tablet TAKE 1 TABLET BY MOUTH DAILY 30 tablet 2 08/23/2015 at Unknown time  . aspirin 325 MG tablet Take 325 mg by mouth daily.   08/24/2015 at Unknown time  . calcium carbonate (OS-CAL) 600 MG TABS tablet Take 600 mg by mouth 2 (two) times daily with a meal.   08/24/2015 at Unknown time  . gabapentin (NEURONTIN) 300 MG capsule TAKE ONE OR TWO CAPSULES BY MOUTH UP TO 3 TIMES A DAY AS DIRECTED. 180 capsule 5 08/24/2015 at Unknown time  . imipramine (TOFRANIL) 50 MG tablet TAKE 2 TABLETS BY MOUTH AT BEDTIME. 60 tablet 11 08/23/2015 at Unknown time  . metFORMIN (GLUCOPHAGE) 500 MG tablet TAKE 1 TABLET BY MOUTH TWICE A DAY WITH MEALS 60 tablet 11 08/24/2015 at Unknown time  . metoprolol (LOPRESSOR) 50 MG tablet TAKE TWO (2) TABLETS BY MOUTH 2 TIMES DAILY 120 tablet 5 08/24/2015 at 8a  . Multiple Vitamins-Minerals (MULTIVITAMIN WITH MINERALS) tablet Take 1 tablet by mouth daily.   08/24/2015 at Unknown time   . omeprazole (PRILOSEC) 20 MG capsule TAKE ONE (1) CAPSULE BY MOUTH EACH DAY 30 capsule 11 08/24/2015 at Unknown time  . sennosides-docusate sodium (SENOKOT-S) 8.6-50 MG tablet Take 2 tablets by mouth 2 (two) times daily.   08/24/2015 at Unknown time  . vitamin C (ASCORBIC ACID) 500 MG tablet Take 500 mg by mouth daily.   08/24/2015 at Unknown time  . atorvastatin (LIPITOR) 10 MG tablet TAKE 1 TABLET BY MOUTH DAILY 30 tablet 11 08/23/2015   Scheduled: .  stroke: mapping our early stages of recovery book   Does not apply Once  . aspirin  300 mg Rectal Daily  . aspirin  325 mg Oral Daily  . atorvastatin  10 mg Oral q1800  . docusate sodium  100 mg Oral BID  . enoxaparin (LOVENOX) injection  40 mg Subcutaneous Q24H  . insulin aspart  0-5 Units Subcutaneous QHS  . insulin aspart  0-9 Units Subcutaneous TID WC  . metoprolol  12.5 mg Oral BID  . pantoprazole  40 mg Oral Daily  . polyethylene glycol  17 g Oral Daily  . sodium chloride flush  3 mL Intravenous Q12H     ROS:  History obtained from unobtainable from patient due to mental status    Blood pressure 142/60, pulse 83, temperature 98.1 F (36.7 C), temperature source Oral, resp. rate 17, weight 84 kg (185 lb 3 oz), SpO2 96 %.   Neurologic Examination:                                                                                                      HEENT-  Normocephalic, no lesions, without obvious abnormality.  Normal external eye and conjunctiva.  Normal TM's bilaterally.  Normal auditory canals and external ears. Normal external nose, mucus membranes and septum.  Normal pharynx. Cardiovascular- S1, S2 normal, pulses palpable throughout   Lungs- chest clear, no wheezing, rales, normal symmetric air entry Abdomen- normal findings: bowel sounds normal Extremities- no edema Lymph-no adenopathy  palpable Musculoskeletal-no joint tenderness, deformity or swelling Skin-warm and dry, no hyperpigmentation, vitiligo, or suspicious lesions  Neurological Examination Mental Status: Alert, slow to respond, has a masked facial appearance, bradykinesia, and cannot follow commands well. Could not tell me the month or name a pen without sever prolonged response.  Cranial Nerves: II:  Visual fields grossly normal, pupils equal, round, reactive to light and accommodation III,IV, VI: ptosis not present, extra-ocular motions intact bilaterally V,VII: smile symmetric, facial light touch sensation normal bilaterally VIII: hearing normal bilaterally IX,X: uvula rises symmetrically XI: bilateral shoulder shrug XII: midline tongue extension Motor: 4/5 throughout, increased tone throughout, slight cog wheeling and significant bradykinesia Sensory: Pinprick and light touch intact throughout, bilaterally Deep Tendon Reflexes: 1+ and symmetric throughout UE and no LE DTR Plantars: Right: downgoing   Left: downgoing Cerebellar: normal finger-to-nose, Gait: not tested but told she walks with a walker and has a short shuffled gait.       Lab Results: Basic Metabolic Panel:  Recent Labs Lab 08/25/15 1228 08/25/15 1718 08/26/15 0211  NA 139  --  140  K 3.5  --  3.5  CL 100*  --  105  CO2 25  --  21*  GLUCOSE 103*  --  97  BUN 9  --  10  CREATININE 1.20*  --  1.08*  CALCIUM 10.3  --  9.4  MG  --  1.3*  --     Liver Function Tests:  Recent Labs Lab 08/25/15 1718 08/26/15 0211  AST 28 28  ALT 14 14  ALKPHOS 57 56  BILITOT 1.1 0.9  PROT 8.0 7.5  ALBUMIN 3.8 3.6   No results for input(s): LIPASE, AMYLASE in the last 168 hours. No results for input(s): AMMONIA in the last 168 hours.  CBC:  Recent Labs Lab 08/25/15 1220 08/26/15 0211  WBC 7.3 7.3  NEUTROABS 4.5  --   HGB 13.8 12.5  HCT 41.6 39.1  MCV 88.7 88.7  PLT 166 157    Cardiac Enzymes:  Recent Labs Lab  08/25/15 1718 08/25/15 1901 08/25/15 2031 08/26/15 0211 08/26/15 1007  TROPONINI 0.04* 0.04* 0.04* 0.04* 0.05*    Lipid Panel:  Recent Labs Lab 08/26/15 0211  CHOL 128  TRIG 86  HDL 42  CHOLHDL 3.0  VLDL 17  LDLCALC 69    CBG:  Recent Labs Lab 08/26/15 1230 08/26/15 1525 08/26/15 2236 08/27/15 0804 08/27/15 1107  GLUCAP 107* 102* 71 110* 134*    Microbiology: Results for orders placed or performed during the hospital encounter of 08/25/15  Urine culture     Status: Abnormal   Collection Time: 08/25/15 12:25 PM  Result Value Ref Range Status   Specimen Description URINE, CATHETERIZED  Final   Special Requests NONE  Final   Culture MULTIPLE SPECIES PRESENT, SUGGEST RECOLLECTION (A)  Final   Report Status 08/27/2015 FINAL  Final    Coagulation Studies:  Recent Labs  08/25/15 1808  LABPROT 14.7  INR 1.13    Imaging: Dg Chest 1 View  08/25/2015  CLINICAL DATA:  Altered mental status for 2 days EXAM: CHEST 1 VIEW COMPARISON:  April 16, 2012 FINDINGS: There is slight scarring in the right apex. Lungs elsewhere are clear. Heart size and pulmonary vascularity are normal. No adenopathy. No pneumothorax. No bone lesions. IMPRESSION: Mild scarring right apex.  No edema or consolidation. Electronically Signed   By: Lowella Grip III M.D.   On: 08/25/2015 13:04   Dg Pelvis 1-2 Views  08/25/2015  CLINICAL DATA:  Pain.  Altered mental status EXAM: PELVIS - 1-2 VIEW COMPARISON:  None. FINDINGS: There is no evidence of pelvic fracture or dislocation. There is mild symmetric narrowing of both hip joints. There is degenerative change in the visualized lumbar spine. No erosive change. IMPRESSION: Areas of osteoarthritic change.  No fracture or dislocation. Electronically Signed   By: Lowella Grip III M.D.   On: 08/25/2015 13:05   Dg Abd 1 View  08/25/2015  CLINICAL DATA:  Abdominal pain and altered mental status EXAM: ABDOMEN - 1 VIEW COMPARISON:  CT abdomen and  pelvis January 03, 2014 FINDINGS: There is fairly diffuse stool throughout the colon. There is no bowel dilatation or air-fluid level suggesting obstruction. No free air is evident on this supine examination. There is degenerative change in the lumbar spine. IMPRESSION: Fairly diffuse stool throughout colon. Bowel gas pattern unremarkable. No demonstrable obstruction or free air. Electronically Signed   By: Lowella Grip III M.D.   On: 08/25/2015 13:05   Ct Head Wo Contrast  08/25/2015  CLINICAL DATA:  Altered mental status for 3 days, initial encounter EXAM: CT HEAD WITHOUT CONTRAST TECHNIQUE: Contiguous axial images were obtained from the base of the skull through the vertex without intravenous contrast. COMPARISON:  04/03/2012 FINDINGS: Bony calvarium is intact. No gross soft tissue abnormality is noted. Mild atrophic and chronic white matter ischemic changes are seen. No findings to suggest acute hemorrhage, acute infarction or space-occupying mass lesion are noted. IMPRESSION: Chronic atrophic and ischemic changes.  No acute abnormality noted. Electronically Signed   By: Inez Catalina M.D.   On: 08/25/2015 14:45   Mr Brain Wo Contrast  08/26/2015  CLINICAL DATA:  Lethargy, mental slowness, last seen normal 1 week ago. Delayed speech. History of diabetes, hypertension, hyperlipidemia. EXAM: MRI HEAD WITHOUT CONTRAST TECHNIQUE: Multiplanar, multiecho pulse sequences of the brain and surrounding structures were obtained without intravenous contrast. Coronal diffusion weighted imaging, axial of T1 and coronal T2 sequences not obtained due to patient's inability to remain still. COMPARISON:  CT HEAD Aug 25, 2015 at 1427 hours FINDINGS: Sequences are moderately or severely motion degraded. INTRACRANIAL CONTENTS: No reduced diffusion to suggest acute ischemia. No large area susceptibility artifact though, axial SWAN sequence is severely motion degraded.  The ventricles and sulci are normal for patient's age.  No abnormal parenchymal signal, mass lesions, mass effect. Patchy to confluent supratentorial and pontine white matter FLAIR T2 hyperintensities. No abnormal extra-axial fluid collections. No extra-axial masses though, contrast enhanced sequences would be more sensitive. Normal major intracranial vascular flow voids present at skull base. Asymmetric fullness RIGHT carotid terminus inferiorly directed measuring up to 6 mm. ORBITS: The included ocular globes and orbital contents are non-suspicious. SINUSES: The mastoid air-cells and included paranasal sinuses are well-aerated. SKULL/SOFT TISSUES: No abnormal sellar expansion. No suspicious calvarial bone marrow signal. Craniocervical junction maintained. IMPRESSION: Limited motion degraded abbreviated MRI of the brain. No acute ischemia. Moderate to severe white matter changes compatible chronic small vessel ischemic disease. Possible 6 mm RIGHT carotid terminus aneurysm would be better characterized on MRA versus CTA HEAD on a nonemergent basis. Electronically Signed   By: Elon Alas M.D.   On: 08/26/2015 01:55       Assessment and plan per attending neurologist  Etta Quill PA-C [facility] [phone]  08/27/2015, 12:57 PM   Assessment/Plan:  82 YO female with one week of noted mental slowing and AMS. At this time highly suspect encephalopathy in setting of infection. MRI is negative but does show significant white matter disease. I suspect at baseline she has some neurodegenerative decline which is now compounded with her infection. Exam is notable for increased tone, bradykinesia and masked facial expression. At current time cannot Dx Parkinson's versus parkinsonisms but would recommend out patient neurology follow up after she resolves.   Will obtain EEG  Will be seen by Dr. Silverio Decamp. Please see his attestation note for A/P for any additional work up recommendations.

## 2015-08-27 NOTE — Progress Notes (Signed)
EEG completed; results pending.    

## 2015-08-27 NOTE — Clinical Social Work Note (Signed)
Clinical Social Work Assessment  Patient Details  Name: Erin [name] MRN: [id number] Date of Birth: 18-Aug-1928  Date of referral:  08/27/15               Reason for consult:  Facility Placement                Permission sought to share information with:  Facility Sport and exercise psychologist, Family Supports Permission granted to share information::  Yes, Verbal Permission Granted  Name::      [name] [phone])  Agency::   ([facility] preference)  Relationship::     Contact Information:     Housing/Transportation Living arrangements for the past 2 months:  [place] of Information:  Other (Comment Required) Mauro Kaufmann) Patient Interpreter Needed:  None Criminal Activity/Legal Involvement Pertinent to Current Situation/Hospitalization:  No - Comment as needed Significant Relationships:  Other Family Members (Niece, [name]) Lives with:  Adult Children (son) Do you feel safe going back to the place where you live?  No Need for family participation in patient care:  Yes (Comment) (patient AOX2 only)  Care giving concerns:  No concerns voiced at this time.   Social Worker assessment / plan:  CSW spoke with patient's niece who is also patient's POA.  Niece states she is aware of the PT recommendation as the MD has made her aware of the patient's needs upon dc.  Niece states that patient was from home with her son prior to admission and was "pretty much" independent.  The niece states she did not drive and the niece would assist in the grocery shopping.  Niece also reports that she would visit the patient daily to assess needs.    Employment status:  Retired Forensic scientist:  Medicare PT Recommendations:  [place] / Referral to community resources:  [place]  Patient/Family's Response to care:  Family is agreeable to SNF at time of discharge.  Patient/Family's Understanding of and  Emotional Response to Diagnosis, Current Treatment, and Prognosis:  Niece expresses guilt regarding her inability to bring patient home and care for her as her needs are too great for 1 assist.  CSW offered support.  Niece appreciative of support and assistance.  Niece states she would prefer [facility] due to its close proximity to her home.  Emotional Assessment Appearance:  Appears older than stated age Attitude/Demeanor/Rapport:    Affect (typically observed):  Accepting Orientation:  Oriented to Self, Oriented to Place Alcohol / Substance use:  Not Applicable Psych involvement (Current and /or in the community):  No (Comment)  Discharge Needs  Concerns to be addressed:  No discharge needs identified Readmission within the last 30 days:  No Current discharge risk:  None Barriers to Discharge:  Continued Medical Work up   Dulcy Fanny, LCSW 08/27/2015, 12:19 PM

## 2015-08-27 NOTE — Evaluation (Signed)
Speech Language Pathology Evaluation Patient Details Name: Erin [name] MRN: [id number] DOB: [birth year] Today's Date: 08/27/2015 Time: [id number] SLP Time Calculation (min) (ACUTE ONLY): 26 min  Problem List:  Patient Active Problem List   Diagnosis Date Noted  . UTI (lower urinary tract infection) 08/25/2015  . Colon cancer screening 07/26/2014  . Elevated TSH 07/26/2014  . Elevated serum creatinine 01/24/2014  . Nausea with vomiting 01/07/2014  . Left groin pain 01/07/2014  . Post herpetic neuralgia 10/16/2012  . Encounter for Medicare annual wellness exam 07/05/2012  . Risk for falls 07/05/2012  . Colon cancer H/O 04/07/2012  . Sacroiliac joint dysfunction 04/01/2012  . Sciatica 03/31/2012  . Low back pain 04/02/2011  . Frequent UTI 11/30/2010  . Other screening mammogram 09/30/2010  . DM2 (diabetes mellitus, type 2) ([place]) 09/30/2010  . Post-menopausal 09/30/2010  . ABDOMINAL WALL HERNIA 02/26/2010  . Anemia 08/18/2009  . Obesity 10/31/2007  . Essential hypertension 10/31/2007  . Hyperlipidemia LDL goal <100 07/26/2006  . HEMORRHOIDS 07/26/2006  . OSTEOARTHRITIS 07/26/2006  . Osteopenia 07/26/2006  . SLEEP DISORDER 07/26/2006   Past Medical History:  Past Medical History  Diagnosis Date  . Diabetes mellitus   . Hyperlipidemia   . Osteoarthritis   . Osteoporosis   . Hypertension   . Obesity   . Upper GI bleed     AV malformation/when anticoag  . Sacroiliac joint dysfunction 04/01/2012   Past Surgical History:  Past Surgical History  Procedure Laterality Date  . Appendectomy    . Cholecystectomy    . Colon resection  1988    secondary to cancer  . Polypectomy  1998    benign X 2  . Cervicitis  1964    conization   . Total knee arthroplasty  07/2009    right   . Endometrial polyp  05/2000    hyperplasia-laser treatment  . Joint replacement      Rt knee replacement   HPI:  82 year old female with a history of diabetes, dyslipidemia, hypertension,  morbid obesity, fairly independent at home, and living alone with her niece who is her POA and her caregiver, brought in today to the ER because of lethargy, mental slowness, poor appetite. The patient was last seen normal almost a week ago. Patient's niece who currently provides most of the history states that the patient had some nausea and dry heaving on Tuesday, subsequently developed urinary incontinence and Thursday. She has been extremely slow with her speech and gait and unable to get out of bed. Patient is able to speak but her speech is extremely delayed. Head CT negative, found to have mild UTI.   Assessment / Plan / Recommendation Clinical Impression  Pt presents with cognitive deficts that also appear to mask underlying language deficits. Attention is definitley impaired with pt requiring moderate cues to focus attention to speaker and inability to sustain attention to verbal and functional tasks through completion despite max cues. Pt verbal responses are typically only one word and she is only able to follow 1-2 step commands, which are both likely a product of impaired attention. However, pt also struggled with naming pictures and needed phonemic cues and carrier phrases. Sometimes she responded, "I know it, but i can't say it." She also appeared neglectful of her right visual field without max cues. Findings are concerning for onset of possible dementia with a possible progressive aphasia. Niece reports some word finding and concern for cognitive decline over the past 6 months. Will follow to determine  if pt will continue to need SLP services as UTI resolves; for now recommend consideration for SNF SLP or Home health depending on d/c disposition.     SLP Assessment  Patient needs continued Speech Lanaguage Pathology Services    Follow Up Recommendations  Skilled Nursing facility    Frequency and Duration min 2x/week  2 weeks      SLP Evaluation Prior Functioning   Cognitive/Linguistic Baseline: Baseline deficits Baseline deficit details: mild word finding, cognitive decline over the past 6 months Type of Home: Mobile home  Lives With: Son Available Help at Discharge: Family   Cognition  Overall Cognitive Status: Impaired/Different from baseline Arousal/Alertness: Awake/alert Orientation Level: Oriented to person;Oriented to place;Disoriented to situation;Disoriented to time Attention: Focused;Sustained Focused Attention: Impaired (inconsistent) Focused Attention Impairment: Verbal basic;Functional basic Sustained Attention: Impaired (consistently impaired) Sustained Attention Impairment: Verbal basic;Functional basic Memory: Impaired Memory Impairment: Storage deficit;Retrieval deficit Awareness: Impaired Awareness Impairment: Intellectual impairment Problem Solving: Impaired Problem Solving Impairment: Verbal basic;Functional basic Safety/Judgment: Impaired    Comprehension  Auditory Comprehension Overall Auditory Comprehension: Appears within functional limits for tasks assessed (if able to attend) Commands: Impaired One Step Basic Commands: 75-100% accurate Two Step Basic Commands: 50-74% accurate Multistep Basic Commands: 0-24% accurate Conversation:  (one word responses) Interfering Components: Attention    Expression Verbal Expression Overall Verbal Expression: Impaired Initiation: Impaired Automatic Speech: Counting;Day of week;Social Response;Name Level of Generative/Spontaneous Verbalization: Word Repetition: No impairment Naming: Impairment Responsive: Not tested Confrontation: Impaired Convergent: 25-49% accurate Pragmatics:  (flat affect) Interfering Components: Attention Effective Techniques: Phonemic cues Written Expression Dominant Hand: Right   Oral / Motor  Oral Motor/Sensory Function Overall Oral Motor/Sensory Function: Within functional limits Motor Speech Overall Motor Speech: Appears within functional  limits for tasks assessed Phonation: Hoarse   GO                   Erin Baltimore, MA CCC-SLP [phone]  Erin [name] 08/27/2015, 11:35 AM

## 2015-08-27 NOTE — Clinical Social Work Placement (Signed)
   CLINICAL SOCIAL WORK PLACEMENT  NOTE  Date:  08/27/2015  Patient Details  Name: Erin [name] MRN: [id number] Date of Birth: [birth year]  Clinical Social Work is seeking post-discharge placement for this patient at the [place] level of care (*CSW will initial, date and re-position this form in  chart as items are completed):  Yes   Patient/family provided with [place] Work Department's list of facilities offering this level of care within the geographic area requested by the patient (or if unable, by the patient's family).  Yes   Patient/family informed of their freedom to choose among providers that offer the needed level of care, that participate in Medicare, Medicaid or managed care program needed by the patient, have an available bed and are willing to accept the patient.  Yes   Patient/family informed of [facility]'s ownership interest in [facility] and [facility], as well as of the fact that they are under no obligation to receive care at these facilities.  PASRR submitted to EDS on 08/27/15     PASRR number received on 08/27/15     Existing PASRR number confirmed on       FL2 transmitted to all facilities in geographic area requested by pt/family on 08/27/15     FL2 transmitted to all facilities within larger geographic area on       Patient informed that his/her managed care company has contracts with or will negotiate with certain facilities, including the following:        Yes   Patient/family informed of bed offers received.  Patient chooses bed at [facility]     Physician recommends and patient chooses bed at      Patient to be transferred to [facility] on 08/28/15.  Patient to be transferred to facility by [facility]     Patient family notified on 08/28/15 of transfer.  Name of family member notified:  Niece, [name]     PHYSICIAN Please sign FL2     Additional Comment:     _______________________________________________ Dulcy Fanny, LCSW 08/27/2015, 12:35 PM

## 2015-08-27 NOTE — Progress Notes (Addendum)
[facility] PROGRESS NOTE  Erin [name] JJK:[id number] DOB: 10-13-1928 DOA: 08/25/2015   PCP: [name], MD     Assessment/Plan: Principal Problem:   UTI (lower urinary tract infection) Active Problems:   Hyperlipidemia LDL goal <100   Essential hypertension   DM2 (diabetes mellitus, type 2) (HCC)   Risk for falls   82 year old female with a history of diabetes, dyslipidemia, hypertension, morbid obesity, fairly independent at home, and living alone with her niece who is her POA and her caregiver, brought in today to the ER because of lethargy, mental slowness, poor appetite. The patient was last seen normal almost a week ago. Patient's niece who currently provides most of the history states that the patient had some nausea and dry heaving on Tuesday, subsequently developed urinary incontinence and Thursday. She has been extremely slow with her speech and gait and unable to get out of bed. Patient is able to speak but her speech is extremely delayed. Patient is on aspirin 325 mg a day. No recent history of falls. In the ER the CT of the head does not show any acute abnormality. The patient does have a mild urinary tract infection. Chest x-ray negative. Low-grade fever of 99.2, hypotensive with systolic in the 371I. Creatinine 1.2.  Assessment and plan UTI (lower urinary tract infection)-patient started on Rocephin, low-grade fever and recent onset of symptoms consistent with urinary tract infection that needs treatment  urine culture, blood culture  no growth so far Day 3 of Rocephin, will DC and urine culture no growth so far  Acute encephalopathy, metabolic/neurologic? 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,  2-D echo shows EF of 50-55%, carotid Doppler within normal limits, continue aspirin   PT OT evaluation recommended SNF , speech therapy evaluation Lipid  panel-LDL 69 Unclear as to why patient has declined over the last 1 week Check vitamin B-12, ESR, TSH within normal limits Holding him a [place] Palliative care consultation for goals of care EEG adult to evaluate for seizures , neurology consult for cognitive decline in last 6 months ,especially in last one week    Hyperlipidemia LDL goal <100-LDL 69, continue statin   Essential hypertension/hypertensive overnight, use prn hydralazine for systolic greater than 967 and po metoprolol   DM2 (diabetes mellitus, type 2) (HCC)-hemoglobin A1c 6.2   Risk for falls-fall precautions   DVT prophylaxsis Lovenox  Code Status:  Full code   Family Communication: Discussed in detail with the patient's niece Slade,[name] on 5/15, 5/17 , all imaging results, lab results explained to the patient   Disposition Plan:  Anticipate discharge to SNF tomorrow.    Consultants: Palliative care   Procedures:  None  Antibiotics: Rocephin-5/15       HPI/Subjective: Hypertensive, continues to have slow speech   Objective: Filed Vitals:   08/26/15 1715 08/26/15 2100 08/27/15 0500 08/27/15 0805  BP: 177/69 155/52 166/65 152/71  Pulse: 87 99 87 86  Temp: 98.1 F (36.7 C) 97.9 F (36.6 C) 98.1 F (36.7 C) 98.1 F (36.7 C)  TempSrc: Axillary Oral Oral Oral  Resp: 15 18 20 18   Weight:  84 kg (185 lb 3 oz)    SpO2: 100% 99% 99% 100%    Intake/Output Summary (Last 24 hours) at 08/27/15 1057 Last data filed at 08/26/15 2200  Gross per 24 hour  Intake    185 ml  Output      0 ml  Net    185 ml    Exam:  Examination:  General exam: Appears calm and comfortable  Respiratory system: Clear to auscultation. Respiratory effort normal. Cardiovascular system: S1 & S2 heard, RRR. No JVD, murmurs, rubs, gallops or clicks. No pedal edema. Gastrointestinal system: Abdomen is nondistended, soft and nontender. No organomegaly or masses felt. Normal bowel sounds heard. Central nervous system:  Alert and oriented. No focal neurological deficits. Extremities: Symmetric 5 x 5 power. Skin: No rashes, lesions or ulcers Psychiatry: Judgement and insight appear normal. Mood & affect appropriate.     Data Reviewed: I have personally reviewed following labs and imaging studies  Micro Results No results found for this or any previous visit (from the past 240 hour(s)).  Radiology Reports Dg Chest 1 View  08/25/2015  CLINICAL DATA:  Altered mental status for 2 days EXAM: CHEST 1 VIEW COMPARISON:  April 16, 2012 FINDINGS: There is slight scarring in the right apex. Lungs elsewhere are clear. Heart size and pulmonary vascularity are normal. No adenopathy. No pneumothorax. No bone lesions. IMPRESSION: Mild scarring right apex.  No edema or consolidation. Electronically Signed   By: Lowella Grip III M.D.   On: 08/25/2015 13:04   Dg Pelvis 1-2 Views  08/25/2015  CLINICAL DATA:  Pain.  Altered mental status EXAM: PELVIS - 1-2 VIEW COMPARISON:  None. FINDINGS: There is no evidence of pelvic fracture or dislocation. There is mild symmetric narrowing of both hip joints. There is degenerative change in the visualized lumbar spine. No erosive change. IMPRESSION: Areas of osteoarthritic change.  No fracture or dislocation. Electronically Signed   By: Lowella Grip III M.D.   On: 08/25/2015 13:05   Dg Abd 1 View  08/25/2015  CLINICAL DATA:  Abdominal pain and altered mental status EXAM: ABDOMEN - 1 VIEW COMPARISON:  CT abdomen and pelvis January 03, 2014 FINDINGS: There is fairly diffuse stool throughout the colon. There is no bowel dilatation or air-fluid level suggesting obstruction. No free air is evident on this supine examination. There is degenerative change in the lumbar spine. IMPRESSION: Fairly diffuse stool throughout colon. Bowel gas pattern unremarkable. No demonstrable obstruction or free air. Electronically Signed   By: Lowella Grip III M.D.   On: 08/25/2015 13:05   Ct Head Wo  Contrast  08/25/2015  CLINICAL DATA:  Altered mental status for 3 days, initial encounter EXAM: CT HEAD WITHOUT CONTRAST TECHNIQUE: Contiguous axial images were obtained from the base of the skull through the vertex without intravenous contrast. COMPARISON:  04/03/2012 FINDINGS: Bony calvarium is intact. No gross soft tissue abnormality is noted. Mild atrophic and chronic white matter ischemic changes are seen. No findings to suggest acute hemorrhage, acute infarction or space-occupying mass lesion are noted. IMPRESSION: Chronic atrophic and ischemic changes.  No acute abnormality noted. Electronically Signed   By: Inez Catalina M.D.   On: 08/25/2015 14:45   Mr Brain Wo Contrast  08/26/2015  CLINICAL DATA:  Lethargy, mental slowness, last seen normal 1 week ago. Delayed speech. History of diabetes, hypertension, hyperlipidemia. EXAM: MRI HEAD WITHOUT CONTRAST TECHNIQUE: Multiplanar, multiecho pulse sequences of the brain and surrounding structures were obtained without intravenous contrast. Coronal diffusion weighted imaging, axial of T1 and coronal T2 sequences not obtained due to patient's inability to remain still. COMPARISON:  CT HEAD Aug 25, 2015 at 1427 hours FINDINGS: Sequences are moderately or severely motion degraded. INTRACRANIAL CONTENTS: No reduced diffusion to suggest acute ischemia.  No large area susceptibility artifact though, axial SWAN sequence is severely motion degraded. The ventricles and sulci are normal for patient's age. No abnormal parenchymal signal, mass lesions, mass effect. Patchy to confluent supratentorial and pontine white matter FLAIR T2 hyperintensities. No abnormal extra-axial fluid collections. No extra-axial masses though, contrast enhanced sequences would be more sensitive. Normal major intracranial vascular flow voids present at skull base. Asymmetric fullness RIGHT carotid terminus inferiorly directed measuring up to 6 mm. ORBITS: The included ocular globes and orbital  contents are non-suspicious. SINUSES: The mastoid air-cells and included paranasal sinuses are well-aerated. SKULL/SOFT TISSUES: No abnormal sellar expansion. No suspicious calvarial bone marrow signal. Craniocervical junction maintained. IMPRESSION: Limited motion degraded abbreviated MRI of the brain. No acute ischemia. Moderate to severe white matter changes compatible chronic small vessel ischemic disease. Possible 6 mm RIGHT carotid terminus aneurysm would be better characterized on MRA versus CTA HEAD on a nonemergent basis. Electronically Signed   By: Elon Alas M.D.   On: 08/26/2015 01:55     CBC  Recent Labs Lab 08/25/15 1220 08/26/15 0211  WBC 7.3 7.3  HGB 13.8 12.5  HCT 41.6 39.1  PLT 166 157  MCV 88.7 88.7  MCH 29.4 28.3  MCHC 33.2 32.0  RDW 14.4 14.3  LYMPHSABS 2.0  --   MONOABS 0.7  --   EOSABS 0.1  --   BASOSABS 0.0  --     Chemistries   Recent Labs Lab 08/25/15 1228 08/25/15 1718 08/26/15 0211  NA 139  --  140  K 3.5  --  3.5  CL 100*  --  105  CO2 25  --  21*  GLUCOSE 103*  --  97  BUN 9  --  10  CREATININE 1.20*  --  1.08*  CALCIUM 10.3  --  9.4  MG  --  1.3*  --   AST  --  28 28  ALT  --  14 14  ALKPHOS  --  57 56  BILITOT  --  1.1 0.9   ------------------------------------------------------------------------------------------------------------------ CrCl cannot be calculated (Unknown ideal weight.). ------------------------------------------------------------------------------------------------------------------  Recent Labs  08/25/15 1719  HGBA1C 6.2*   ------------------------------------------------------------------------------------------------------------------  Recent Labs  08/26/15 0211  CHOL 128  HDL 42  LDLCALC 69  TRIG 86  CHOLHDL 3.0   ------------------------------------------------------------------------------------------------------------------  Recent Labs  08/25/15 1719  TSH 1.216    ------------------------------------------------------------------------------------------------------------------ No results for input(s): VITAMINB12, FOLATE, FERRITIN, TIBC, IRON, RETICCTPCT in the last 72 hours.  Coagulation profile  Recent Labs Lab 08/25/15 1808  INR 1.13    No results for input(s): DDIMER in the last 72 hours.  Cardiac Enzymes  Recent Labs Lab 08/25/15 2031 08/26/15 0211 08/26/15 1007  TROPONINI 0.04* 0.04* 0.05*   ------------------------------------------------------------------------------------------------------------------ Invalid input(s): POCBNP   CBG:  Recent Labs Lab 08/26/15 0859 08/26/15 1230 08/26/15 1525 08/26/15 2236 08/27/15 0804  GLUCAP 91 107* 102* 86 110*       Studies: Dg Chest 1 View  08/25/2015  CLINICAL DATA:  Altered mental status for 2 days EXAM: CHEST 1 VIEW COMPARISON:  April 16, 2012 FINDINGS: There is slight scarring in the right apex. Lungs elsewhere are clear. Heart size and pulmonary vascularity are normal. No adenopathy. No pneumothorax. No bone lesions. IMPRESSION: Mild scarring right apex.  No edema or consolidation. Electronically Signed   By: Lowella Grip III M.D.   On: 08/25/2015 13:04   Dg Pelvis 1-2 Views  08/25/2015  CLINICAL DATA:  Pain.  Altered mental status EXAM:  PELVIS - 1-2 VIEW COMPARISON:  None. FINDINGS: There is no evidence of pelvic fracture or dislocation. There is mild symmetric narrowing of both hip joints. There is degenerative change in the visualized lumbar spine. No erosive change. IMPRESSION: Areas of osteoarthritic change.  No fracture or dislocation. Electronically Signed   By: Lowella Grip III M.D.   On: 08/25/2015 13:05   Dg Abd 1 View  08/25/2015  CLINICAL DATA:  Abdominal pain and altered mental status EXAM: ABDOMEN - 1 VIEW COMPARISON:  CT abdomen and pelvis January 03, 2014 FINDINGS: There is fairly diffuse stool throughout the colon. There is no bowel dilatation  or air-fluid level suggesting obstruction. No free air is evident on this supine examination. There is degenerative change in the lumbar spine. IMPRESSION: Fairly diffuse stool throughout colon. Bowel gas pattern unremarkable. No demonstrable obstruction or free air. Electronically Signed   By: Lowella Grip III M.D.   On: 08/25/2015 13:05   Ct Head Wo Contrast  08/25/2015  CLINICAL DATA:  Altered mental status for 3 days, initial encounter EXAM: CT HEAD WITHOUT CONTRAST TECHNIQUE: Contiguous axial images were obtained from the base of the skull through the vertex without intravenous contrast. COMPARISON:  04/03/2012 FINDINGS: Bony calvarium is intact. No gross soft tissue abnormality is noted. Mild atrophic and chronic white matter ischemic changes are seen. No findings to suggest acute hemorrhage, acute infarction or space-occupying mass lesion are noted. IMPRESSION: Chronic atrophic and ischemic changes.  No acute abnormality noted. Electronically Signed   By: Inez Catalina M.D.   On: 08/25/2015 14:45   Mr Brain Wo Contrast  08/26/2015  CLINICAL DATA:  Lethargy, mental slowness, last seen normal 1 week ago. Delayed speech. History of diabetes, hypertension, hyperlipidemia. EXAM: MRI HEAD WITHOUT CONTRAST TECHNIQUE: Multiplanar, multiecho pulse sequences of the brain and surrounding structures were obtained without intravenous contrast. Coronal diffusion weighted imaging, axial of T1 and coronal T2 sequences not obtained due to patient's inability to remain still. COMPARISON:  CT HEAD Aug 25, 2015 at 1427 hours FINDINGS: Sequences are moderately or severely motion degraded. INTRACRANIAL CONTENTS: No reduced diffusion to suggest acute ischemia. No large area susceptibility artifact though, axial SWAN sequence is severely motion degraded. The ventricles and sulci are normal for patient's age. No abnormal parenchymal signal, mass lesions, mass effect. Patchy to confluent supratentorial and pontine white  matter FLAIR T2 hyperintensities. No abnormal extra-axial fluid collections. No extra-axial masses though, contrast enhanced sequences would be more sensitive. Normal major intracranial vascular flow voids present at skull base. Asymmetric fullness RIGHT carotid terminus inferiorly directed measuring up to 6 mm. ORBITS: The included ocular globes and orbital contents are non-suspicious. SINUSES: The mastoid air-cells and included paranasal sinuses are well-aerated. SKULL/SOFT TISSUES: No abnormal sellar expansion. No suspicious calvarial bone marrow signal. Craniocervical junction maintained. IMPRESSION: Limited motion degraded abbreviated MRI of the brain. No acute ischemia. Moderate to severe white matter changes compatible chronic small vessel ischemic disease. Possible 6 mm RIGHT carotid terminus aneurysm would be better characterized on MRA versus CTA HEAD on a nonemergent basis. Electronically Signed   By: Elon Alas M.D.   On: 08/26/2015 01:55      Lab Results  Component Value Date   HGBA1C 6.2* 08/25/2015   HGBA1C 6.3 07/23/2015   HGBA1C 6.5 01/20/2015   Lab Results  Component Value Date   MICROALBUR 1.7 01/23/2014   LDLCALC 69 08/26/2015   CREATININE 1.08* 08/26/2015       Scheduled Meds: .  stroke: mapping  our early stages of recovery book   Does not apply Once  . aspirin  300 mg Rectal Daily  . aspirin  325 mg Oral Daily  . atorvastatin  10 mg Oral q1800  . cefTRIAXone (ROCEPHIN)  IV  1 g Intravenous Q24H  . docusate sodium  100 mg Oral BID  . enoxaparin (LOVENOX) injection  40 mg Subcutaneous Q24H  . insulin aspart  0-5 Units Subcutaneous QHS  . insulin aspart  0-9 Units Subcutaneous TID WC  . metoprolol  12.5 mg Oral BID  . pantoprazole  40 mg Oral Daily  . polyethylene glycol  17 g Oral Daily  . sodium chloride flush  3 mL Intravenous Q12H   Continuous Infusions:     LOS: 2 days    Time spent: >30 MINS    [facility]  [facility] Pager  [phone]. If 7PM-7AM, please contact night-coverage at [email or web address], password [facility] 08/27/2015, 10:57 AM  LOS: 2 days

## 2015-08-27 NOTE — Telephone Encounter (Signed)
I spoke to her -looks like urine cx is still pending (and ua was pos)-perhaps she will need a different abx  Will see how that turns out Will continue to follow

## 2015-08-27 NOTE — Telephone Encounter (Signed)
Erin [name] wants to get a call back from Dr. Glori Bickers if possible to discuss pt's status. Pt has been in the hospital since 08/25/15 and she wants to make sure Dr. Glori Bickers is aware. [name] has some questions she wants to ask Dr. Glori Bickers because pt is incontinent, and she isn't really eating, [name] has to feed her to get her to eat. [name] is requesting a call back ASAP

## 2015-08-27 NOTE — NC FL2 (Signed)
[place] LEVEL OF CARE SCREENING TOOL     IDENTIFICATION  Patient Name: Erin [name] Birthdate: [birth year] Sex: female Admission Date (Current Location): 08/25/2015  [facility] and [place] Number:  Herbalist and Address:  [facility], [place] [address] [place], [place] [address]      Provider Number: [id number]  Attending Physician Name and Address:  Reyne Dumas, MD  Relative Name and Phone Number:       Current Level of Care: Hospital Recommended Level of Care: [place] Prior Approval Number:    Date Approved/Denied:   PASRR Number: [id number]  Discharge Plan: SNF    Current Diagnoses: Patient Active Problem List   Diagnosis Date Noted  . UTI (lower urinary tract infection) 08/25/2015  . Colon cancer screening 07/26/2014  . Elevated TSH 07/26/2014  . Elevated serum creatinine 01/24/2014  . Nausea with vomiting 01/07/2014  . Left groin pain 01/07/2014  . Post herpetic neuralgia 10/16/2012  . Encounter for Medicare annual wellness exam 07/05/2012  . Risk for falls 07/05/2012  . Colon cancer H/O 04/07/2012  . Sacroiliac joint dysfunction 04/01/2012  . Sciatica 03/31/2012  . Low back pain 04/02/2011  . Frequent UTI 11/30/2010  . Other screening mammogram 09/30/2010  . DM2 (diabetes mellitus, type 2) ([place]) 09/30/2010  . Post-menopausal 09/30/2010  . ABDOMINAL WALL HERNIA 02/26/2010  . Anemia 08/18/2009  . Obesity 10/31/2007  . Essential hypertension 10/31/2007  . Hyperlipidemia LDL goal <100 07/26/2006  . HEMORRHOIDS 07/26/2006  . OSTEOARTHRITIS 07/26/2006  . Osteopenia 07/26/2006  . SLEEP DISORDER 07/26/2006    Orientation RESPIRATION BLADDER Height & Weight     Self, Time  Normal Incontinent Weight: 185 lb 3 oz (84 kg) Height:     BEHAVIORAL SYMPTOMS/MOOD NEUROLOGICAL BOWEL NUTRITION STATUS      Continent Diet (Carb Modified)  AMBULATORY STATUS COMMUNICATION OF NEEDS Skin   Extensive  Assist Verbally  (open wound on sacrum- 2cmx.05cm dry dressing changes)                       Personal Care Assistance Level of Assistance  Feeding, Bathing, Dressing Bathing Assistance: Limited assistance Feeding assistance: Independent Dressing Assistance: Limited assistance     Functional Limitations Info             SPECIAL CARE FACTORS FREQUENCY  PT (By licensed PT), OT (By licensed OT)     PT Frequency: daily OT Frequency: daily            Contractures Contractures Info: Not present    Additional Factors Info  Allergies   Allergies Info: Fluoxetine Hcl, Hydrocodone, Shellfish Allergy, Diclofenac Sodium           Current Medications (08/27/2015):  This is the current hospital active medication list Current Facility-Administered Medications  Medication Dose Route Frequency Provider Last Rate Last Dose  .  stroke: mapping our early stages of recovery book   Does not apply Once Reyne Dumas, MD      . 0.9 %  sodium chloride infusion   Intravenous Continuous Reyne Dumas, MD 75 mL/hr at 08/27/15 1121    . aspirin suppository 300 mg  300 mg Rectal Daily Reyne Dumas, MD      . aspirin tablet 325 mg  325 mg Oral Daily Reyne Dumas, MD   325 mg at 08/27/15 0923  . atorvastatin (LIPITOR) tablet 10 mg  10 mg Oral q1800 Reyne Dumas, MD   10  mg at 08/26/15 1632  . docusate sodium (COLACE) capsule 100 mg  100 mg Oral BID Reyne Dumas, MD   100 mg at 08/27/15 0924  . enoxaparin (LOVENOX) injection 40 mg  40 mg Subcutaneous Q24H Reyne Dumas, MD   40 mg at 08/26/15 2212  . insulin aspart (novoLOG) injection 0-5 Units  0-5 Units Subcutaneous QHS Reyne Dumas, MD   0 Units at 08/25/15 2200  . insulin aspart (novoLOG) injection 0-9 Units  0-9 Units Subcutaneous TID WC Reyne Dumas, MD   1 Units at 08/27/15 1125  . metoprolol tartrate (LOPRESSOR) tablet 12.5 mg  12.5 mg Oral BID Reyne Dumas, MD   12.5 mg at 08/27/15 0923  . pantoprazole (PROTONIX) EC tablet 40 mg  40 mg  Oral Daily Reyne Dumas, MD   40 mg at 08/27/15 0923  . polyethylene glycol (MIRALAX / GLYCOLAX) packet 17 g  17 g Oral Daily Reyne Dumas, MD   17 g at 08/27/15 0924  . sodium chloride flush (NS) 0.9 % injection 3 mL  3 mL Intravenous Q12H Reyne Dumas, MD   3 mL at 08/27/15 1121     Discharge Medications: Please see discharge summary for a list of discharge medications.  Relevant Imaging Results:  Relevant Lab Results:   Additional Information SSN: [id number]  Dulcy Fanny, LCSW

## 2015-08-28 DIAGNOSIS — Z9181 History of falling: Secondary | ICD-10-CM

## 2015-08-28 DIAGNOSIS — G20 Parkinson's disease: Secondary | ICD-10-CM

## 2015-08-28 DIAGNOSIS — Z7189 Other specified counseling: Secondary | ICD-10-CM | POA: Insufficient documentation

## 2015-08-28 DIAGNOSIS — Z515 Encounter for palliative care: Secondary | ICD-10-CM | POA: Insufficient documentation

## 2015-08-28 DIAGNOSIS — K5901 Slow transit constipation: Secondary | ICD-10-CM

## 2015-08-28 LAB — COMPREHENSIVE METABOLIC PANEL
ALBUMIN: 3.5 g/dL (ref 3.5–5.0)
ALK PHOS: 60 U/L (ref 38–126)
ALT: 14 U/L (ref 14–54)
ANION GAP: 14 (ref 5–15)
AST: 27 U/L (ref 15–41)
CALCIUM: 9.2 mg/dL (ref 8.9–10.3)
CO2: 22 mmol/L (ref 22–32)
Chloride: 103 mmol/L (ref 101–111)
Creatinine, Ser: 0.88 mg/dL (ref 0.44–1.00)
GFR calc Af Amer: >60 mL/min (ref >60)
GFR calc non Af Amer: 58 mL/min — ABNORMAL LOW (ref >60)
GLUCOSE: 110 mg/dL — AB (ref 65–99)
Potassium: 3.3 mmol/L — ABNORMAL LOW (ref 3.5–5.1)
SODIUM: 139 mmol/L (ref 135–145)
Total Bilirubin: 0.8 mg/dL (ref 0.3–1.2)
Total Protein: 8 g/dL (ref 6.5–8.1)

## 2015-08-28 LAB — CBC
HCT: 40.2 % (ref 36.0–46.0)
HEMOGLOBIN: 13.3 g/dL (ref 12.0–15.0)
MCH: 28.7 pg (ref 26.0–34.0)
MCHC: 33.1 g/dL (ref 30.0–36.0)
MCV: 86.6 fL (ref 78.0–100.0)
Platelets: 182 10*3/uL (ref 150–400)
RBC: 4.64 MIL/uL (ref 3.87–5.11)
RDW: 14.1 % (ref 11.5–15.5)
WBC: 6.2 10*3/uL (ref 4.0–10.5)

## 2015-08-28 LAB — URINALYSIS, ROUTINE W REFLEX MICROSCOPIC
BILIRUBIN URINE: NEGATIVE
Glucose, UA: NEGATIVE mg/dL
Hgb urine dipstick: NEGATIVE
Ketones, ur: NEGATIVE mg/dL
LEUKOCYTES UA: NEGATIVE
NITRITE: NEGATIVE
Protein, ur: NEGATIVE mg/dL
SPECIFIC GRAVITY, URINE: 1.01 (ref 1.005–1.030)
pH: 7 (ref 5.0–8.0)

## 2015-08-28 LAB — GLUCOSE, CAPILLARY
GLUCOSE-CAPILLARY: 125 mg/dL — AB (ref 65–99)
GLUCOSE-CAPILLARY: 102 mg/dL — AB (ref 65–99)
Glucose-Capillary: 109 mg/dL — ABNORMAL HIGH (ref 65–99)
Glucose-Capillary: 132 mg/dL — ABNORMAL HIGH (ref 65–99)

## 2015-08-28 LAB — CBC AND DIFFERENTIAL: WBC: 6.2 10^3/mL

## 2015-08-28 LAB — MAGNESIUM: MAGNESIUM: 1.4 mg/dL — AB (ref 1.7–2.4)

## 2015-08-28 LAB — RPR: RPR: NONREACTIVE

## 2015-08-28 MED ORDER — FLEET ENEMA 7-19 GM/118ML RE ENEM
1.0000 | ENEMA | Freq: Once | RECTAL | Status: AC
  Administered 2015-08-28: 1 via RECTAL
  Filled 2015-08-28: qty 1

## 2015-08-28 MED ORDER — AMANTADINE HCL 100 MG PO CAPS
100.0000 mg | ORAL_CAPSULE | Freq: Every morning | ORAL | Status: DC
  Administered 2015-08-28 – 2015-08-29 (×2): 100 mg via ORAL
  Filled 2015-08-28 (×2): qty 1

## 2015-08-28 MED ORDER — POTASSIUM CHLORIDE CRYS ER 20 MEQ PO TBCR
40.0000 meq | EXTENDED_RELEASE_TABLET | Freq: Once | ORAL | Status: AC
  Administered 2015-08-28: 40 meq via ORAL
  Filled 2015-08-28: qty 2

## 2015-08-28 MED ORDER — BISACODYL 10 MG RE SUPP
10.0000 mg | Freq: Once | RECTAL | Status: AC
Start: 2015-08-28 — End: 2015-08-28
  Administered 2015-08-28: 10 mg via RECTAL
  Filled 2015-08-28: qty 1

## 2015-08-28 MED ORDER — METOPROLOL TARTRATE 12.5 MG HALF TABLET
37.5000 mg | ORAL_TABLET | Freq: Once | ORAL | Status: AC
  Administered 2015-08-28: 37.5 mg via ORAL
  Filled 2015-08-28: qty 1

## 2015-08-28 MED ORDER — METOPROLOL TARTRATE 50 MG PO TABS
50.0000 mg | ORAL_TABLET | Freq: Two times a day (BID) | ORAL | Status: DC
Start: 2015-08-28 — End: 2015-08-29
  Administered 2015-08-28 – 2015-08-29 (×2): 50 mg via ORAL
  Filled 2015-08-28 (×2): qty 1

## 2015-08-28 MED ORDER — SENNOSIDES-DOCUSATE SODIUM 8.6-50 MG PO TABS
2.0000 | ORAL_TABLET | Freq: Two times a day (BID) | ORAL | Status: DC
  Administered 2015-08-28 – 2015-08-29 (×3): 2 via ORAL
  Filled 2015-08-28 (×3): qty 2

## 2015-08-28 MED ORDER — MAGNESIUM SULFATE 2 GM/50ML IV SOLN
2.0000 g | Freq: Once | INTRAVENOUS | Status: AC
  Administered 2015-08-28: 2 g via INTRAVENOUS
  Filled 2015-08-28: qty 50

## 2015-08-28 MED ORDER — ONDANSETRON HCL 4 MG/2ML IJ SOLN
4.0000 mg | Freq: Four times a day (QID) | INTRAMUSCULAR | Status: DC | PRN
  Administered 2015-08-28: 4 mg via INTRAVENOUS
  Filled 2015-08-28: qty 2

## 2015-08-28 MED ORDER — METOPROLOL TARTRATE 50 MG PO TABS: 50.0000 mg | ORAL_TABLET | Freq: Two times a day (BID) | ORAL | Status: DC

## 2015-08-28 NOTE — Progress Notes (Signed)
Patient has orders for a urinalysis and urine culture, but patient is VERY incontinent. Attempted to catch urine several times but have been unsuccessful. Also attempted to do an I&O cath to obtain sample, but this was unsuccessful as patient voids whenever she is repositioned. Dr. Allyson Sabal aware. Will continue to monitor and attempt again.  [name], RN.

## 2015-08-28 NOTE — Progress Notes (Signed)
Physical Therapy Treatment Patient Details Name: Erin [name] MRN: [id number] DOB: 1929-01-30 Today's Date: 08/28/2015    History of Present Illness 82 year old female with a history of diabetes, dyslipidemia, hypertension, morbid obesity, fairly independent at home, and living alone with her niece who is her POA and her caregiver, brought in today to the ER because of lethargy, mental slowness, poor appetite.    PT Comments    Pt with increased mobility today and able to take a few pivotal steps from bed to recliner with RW and +2 A.  Con't to recommend SNF.  Follow Up Recommendations  SNF     Equipment Recommendations  Other (comment) (to be determined)    Recommendations for Other Services       Precautions / Restrictions Precautions Precautions: Fall Restrictions Weight Bearing Restrictions: No    Mobility  Bed Mobility Overal bed mobility: Needs Assistance Bed Mobility: Supine to Sit     Supine to sit: Mod assist     General bed mobility comments: A to get trunk upright and hips turned  Transfers Overall transfer level: Needs assistance Equipment used: Rolling walker (2 wheeled) Transfers: Sit to/from Stand Sit to Stand: Mod assist;+2 physical assistance Stand pivot transfers: Mod assist;+2 safety/equipment       General transfer comment: Pt able to fully achieve standing this visit and was able to do SPT with RW and +2 for safety.  Pt wanting to sit too early, letting go of RW and reaching for chair. Pt fatigued from SPT.  Ambulation/Gait             General Gait Details: Took a few turning steps with RW with SPT.   Stairs            Wheelchair Mobility    Modified Rankin (Stroke Patients Only)       Balance Overall balance assessment: Needs assistance Sitting-balance support: Bilateral upper extremity supported;Feet supported Sitting balance-Leahy Scale: Fair     Standing balance support: Bilateral upper extremity  supported Standing balance-Leahy Scale: Poor Standing balance comment: Improved standing balance today, but requires UE A.                    Cognition Arousal/Alertness: Awake/alert Behavior During Therapy: Flat affect Overall Cognitive Status: Impaired/Different from baseline               Problem Solving: Slow processing;Decreased initiation;Requires verbal cues;Requires tactile cues;Difficulty sequencing      Exercises      General Comments General comments (skin integrity, edema, etc.): 2 family members present      Pertinent Vitals/Pain Pain Assessment: No/denies pain    Home Living                      Prior Function            PT Goals (current goals can now be found in the care plan section) Acute Rehab PT Goals Patient Stated Goal: Did not state PT Goal Formulation: With family Time For Goal Achievement: 09/09/15 Potential to Achieve Goals: Good Progress towards PT goals: Progressing toward goals    Frequency  Min 3X/week    PT Plan Current plan remains appropriate    Co-evaluation             End of Session Equipment Utilized During Treatment: Gait belt Activity Tolerance: Patient limited by fatigue;Patient tolerated treatment well Patient left: in chair;with call bell/phone within reach;with chair alarm set;with family/visitor present  Time: [id number] PT Time Calculation (min) (ACUTE ONLY): 19 min  Charges:  $Therapeutic Activity: 8-22 mins                    G Codes:      Erin [name] 08/28/2015, 2:15 PM

## 2015-08-28 NOTE — Procedures (Signed)
History: ANACHRISTINA MECKSTROTH is an 82 y.o. female patient with altered mental status. Routine inpatient EEG was performed for further evaluation.   Patient Active Problem List   Diagnosis Date Noted  . UTI (lower urinary tract infection) 08/25/2015  . Colon cancer screening 07/26/2014  . Elevated TSH 07/26/2014  . Elevated serum creatinine 01/24/2014  . Nausea with vomiting 01/07/2014  . Left groin pain 01/07/2014  . Post herpetic neuralgia 10/16/2012  . Encounter for Medicare annual wellness exam 07/05/2012  . Risk for falls 07/05/2012  . Colon cancer H/O 04/07/2012  . Sacroiliac joint dysfunction 04/01/2012  . Sciatica 03/31/2012  . Low back pain 04/02/2011  . Frequent UTI 11/30/2010  . Other screening mammogram 09/30/2010  . DM2 (diabetes mellitus, type 2) ([place]) 09/30/2010  . Post-menopausal 09/30/2010  . ABDOMINAL WALL HERNIA 02/26/2010  . Anemia 08/18/2009  . Obesity 10/31/2007  . Essential hypertension 10/31/2007  . Hyperlipidemia LDL goal <100 07/26/2006  . HEMORRHOIDS 07/26/2006  . OSTEOARTHRITIS 07/26/2006  . Osteopenia 07/26/2006  . SLEEP DISORDER 07/26/2006     Current facility-administered medications:  .   stroke: mapping our early stages of recovery book, , Does not apply, Once, Reyne Dumas, MD .  aspirin suppository 300 mg, 300 mg, Rectal, Daily, Reyne Dumas, MD .  aspirin tablet 325 mg, 325 mg, Oral, Daily, Reyne Dumas, MD, 325 mg at 08/27/15 0923 .  atorvastatin (LIPITOR) tablet 10 mg, 10 mg, Oral, q1800, Reyne Dumas, MD, 10 mg at 08/27/15 1813 .  docusate sodium (COLACE) capsule 100 mg, 100 mg, Oral, BID, Reyne Dumas, MD, 100 mg at 08/27/15 2119 .  enoxaparin (LOVENOX) injection 40 mg, 40 mg, Subcutaneous, Q24H, Reyne Dumas, MD, 40 mg at 08/27/15 2119 .  insulin aspart (novoLOG) injection 0-5 Units, 0-5 Units, Subcutaneous, QHS, Reyne Dumas, MD, 0 Units at 08/25/15 2200 .  insulin aspart (novoLOG) injection 0-9 Units, 0-9 Units, Subcutaneous, TID WC, Reyne Dumas, MD, 1 Units at 08/27/15 1125 .  metoprolol (LOPRESSOR) tablet 50 mg, 50 mg, Oral, BID, Reyne Dumas, MD .  pantoprazole (PROTONIX) EC tablet 40 mg, 40 mg, Oral, Daily, Reyne Dumas, MD, 40 mg at 08/27/15 0923 .  polyethylene glycol (MIRALAX / GLYCOLAX) packet 17 g, 17 g, Oral, Daily, Reyne Dumas, MD, 17 g at 08/27/15 0924 .  potassium chloride SA (K-DUR,KLOR-CON) CR tablet 40 mEq, 40 mEq, Oral, Once, Reyne Dumas, MD .  sodium chloride flush (NS) 0.9 % injection 3 mL, 3 mL, Intravenous, Q12H, Reyne Dumas, MD, 3 mL at 08/27/15 1121   Introduction:  This is a 19 channel routine scalp EEG performed at the bedside with bipolar and monopolar montages arranged in accordance to the international 10/20 system of electrode placement. One channel was dedicated to EKG recording.   Findings:  Background slowing in the range of 6 - 6.5 Hz noted.  No definite evidence of abnormal epileptiform discharges or electrographic seizures were noted during this recording.   Impression:  Abnormal routine inpatient EEG suggestive of mild to moderate encephalopathy. No seizures. Clinical correlation is recommended .

## 2015-08-28 NOTE — Progress Notes (Signed)
Notified by telemetry monitoring tech that patient was sustaining in 140's on the monitor. Upon entering the patient's room, she was sustaining in the 80's on the portable monitor. No s/s distress noted, denies chest pain. Administered 10am dose of metoprolol and notified Dr. Allyson Sabal. Will continue to monitor.  [name], RN.

## 2015-08-28 NOTE — Progress Notes (Signed)
No charge  Note:    Spoke with Melonie [place] to attempt to schedule a [place] meeting.  [name] will call me back with her availability - she is concerned about going to work today.  She had questions about the patient's work up.  Hopefully we will be able to hold a GOC today - otherwise if patient is discharged = please ask for palliative follow up at SNF.  Imogene Burn, [place] [facility] Pager: [phone]

## 2015-08-28 NOTE — Consult Note (Signed)
Consultation Note Date: 08/28/2015   Patient Name: Erin [name]  DOB: May 02, 1928  MRN: [id number]  Age / Sex: 82 y.o., female  PCP: Erin Greenspan, MD Referring Physician: Reyne Dumas, MD  Reason for Consultation: Establishing goals of care  HPI/Patient Profile: 82 y.o. female  with past medical history of DM, UGI bleed, Obesity, hip pain admitted on 08/25/2015 with acute change in mental status, lethargy, decreased appetite - new urinary incontinence and nausea.  Work up has been inconclusive.  Neurology consulted. U/A shows no specific infection.  Blood work shows no source of lethargy.  MRI shows moderate to severe white matter changes compatible chronic small vessel ischemic disease.  Clinical Assessment and Goals of Care: Met at bedside with patient's neice's Erin [name], and second neice) as wells as "Erin [name]".  Per Erin [name] patient lives at home with her mentally disabled 71 yo son.  Her baseline is to be able to walk with a walker, speaking normally, and eating light - but a normal amount.  She is active in the [employer] and loves her Cat.  She used to be a very active woman and at one time was the [employer].  Erin [name] reports that over the last year her Erin has had trouble word finding and expressing herself.  She began to over pay her bills.  Erin [name] has taken over the check book, drives her Erin and does all of the grocery shopping.  She says the patient has begun having more balance issues - she will be afraid to walk because she is afraid of becoming dizzy and falling.  She describes the patient's gait as taking very small steps.  She has been falling frequently (Sliding to the floor per the patient).     She has had altered mental status in the past when taking hydrocodone.  At that time she was also very constipated as she is now.  With regard to goals of care - Erin [name] is the patient's  HCPOA.  She has a living will that indicates she is a DNR.  The patient has clearly indicated she wants no CPR or life support.  Her family favors quality of life over quantity and believe more of a comfort approach is best for their Erin.  I suggested that if the acute changes for which the patient was hospitalized do not remedy themselves in the next days / weeks the patient could be suffering with a more chronic illness possibly a type of dementia  - possibly parkinsons.  We discussed the trajectory of dementia and the need for hospice services if her mental status does not improve.  I committed to Erin [name] to help remedy the patient's constipation, discuss the case with neurology and to repeat the U/A.  HCPOA:  Erin [name] (Neice)    SUMMARY OF RECOMMENDATIONS    Ordered a Fleet enema and senna/S two tab BID.  Discussed case with neurology who will consider starting a parkinsons medication (Sinemet?)  Recommend Hospice Services if patient's mental status  does not improve.  Patient's mentally disabled son (56 yo) may need services and support  - particularly understanding his mother's decline.  Code Status/Advance Care Planning:  DNR    Symptom Management:   Constipation - Enema, Senna S  Cog wheeling / abnormal gait - will defer to neurology   Psycho-social/Spiritual:   Desire for further Chaplaincy support:yes  Additional Recommendations: Caregiving  Support/Resources, Education on Hospice and Grief/Bereavement Support  Prognosis:   < 6 months  Discharge Planning: To Be Determined      Primary Diagnoses: Present on Admission:  . Hyperlipidemia LDL goal <100 . Essential hypertension  I have reviewed the medical record, interviewed the patient and family, and examined the patient. The following aspects are pertinent.  Past Medical History  Diagnosis Date  . Diabetes mellitus   . Hyperlipidemia   . Osteoarthritis   . Osteoporosis   . Hypertension   . Obesity    . Upper GI bleed     AV malformation/when anticoag  . Sacroiliac joint dysfunction 04/01/2012   Social History   Social History  . Marital Status: Widowed    Spouse Name: N/A  . Number of Children: N/A  . Years of Education: N/A   Social History Main Topics  . Smoking status: Former Smoker    Types: Cigarettes    Quit date: 08/14/1962  . Smokeless tobacco: Never Used     Comment: Quit over 10 years ago  . Alcohol Use: No  . Drug Use: No  . Sexual Activity: Not Asked   Other Topics Concern  . None   Social History Narrative   Lives with son who is mentally impaired   Family History  Problem Relation Age of Onset  . Stroke Father   . Cancer Brother     lung   Scheduled Meds: .  stroke: mapping our early stages of recovery book   Does not apply Once  . amantadine  100 mg Oral q morning - 10a  . aspirin  300 mg Rectal Daily  . aspirin  325 mg Oral Daily  . atorvastatin  10 mg Oral q1800  . enoxaparin (LOVENOX) injection  40 mg Subcutaneous Q24H  . insulin aspart  0-5 Units Subcutaneous QHS  . insulin aspart  0-9 Units Subcutaneous TID WC  . magnesium sulfate 1 - 4 g bolus IVPB  2 g Intravenous Once  . metoprolol tartrate  50 mg Oral BID  . pantoprazole  40 mg Oral Daily  . polyethylene glycol  17 g Oral Daily  . senna-docusate  2 tablet Oral BID  . sodium chloride flush  3 mL Intravenous Q12H  . sodium phosphate  1 enema Rectal Once   Continuous Infusions:  PRN Meds:.ondansetron (ZOFRAN) IV Medications Prior to Admission:  Prior to Admission medications   Medication Sig Start Date End Date Taking? Authorizing Provider  acetaminophen (TYLENOL) 500 MG tablet Take 1,000 mg by mouth every 4 (four) hours as needed.   Yes Historical Provider, MD  amLODipine (NORVASC) 5 MG tablet TAKE 1 TABLET BY MOUTH DAILY 05/19/15  Yes Erin Greenspan, MD  aspirin 325 MG tablet Take 325 mg by mouth daily.   Yes Historical Provider, MD  calcium carbonate (OS-CAL) 600 MG TABS tablet  Take 600 mg by mouth 2 (two) times daily with a meal.   Yes Historical Provider, MD  gabapentin (NEURONTIN) 300 MG capsule TAKE ONE OR TWO CAPSULES BY MOUTH UP TO 3 TIMES A DAY AS DIRECTED. 08/08/15  Yes Erin Greenspan, MD  imipramine (TOFRANIL) 50 MG tablet TAKE 2 TABLETS BY MOUTH AT BEDTIME. 07/30/15  Yes Erin Greenspan, MD  metFORMIN (GLUCOPHAGE) 500 MG tablet TAKE 1 TABLET BY MOUTH TWICE A DAY WITH MEALS 07/30/15  Yes Erin Greenspan, MD  metoprolol (LOPRESSOR) 50 MG tablet TAKE TWO (2) TABLETS BY MOUTH 2 TIMES DAILY 01/14/15  Yes Erin Greenspan, MD  Multiple Vitamins-Minerals (MULTIVITAMIN WITH MINERALS) tablet Take 1 tablet by mouth daily.   Yes Historical Provider, MD  omeprazole (PRILOSEC) 20 MG capsule TAKE ONE (1) CAPSULE BY MOUTH EACH DAY 08/08/15  Yes Erin Greenspan, MD  sennosides-docusate sodium (SENOKOT-S) 8.6-50 MG tablet Take 2 tablets by mouth 2 (two) times daily.   Yes Historical Provider, MD  vitamin C (ASCORBIC ACID) 500 MG tablet Take 500 mg by mouth daily.   Yes Historical Provider, MD  atorvastatin (LIPITOR) 10 MG tablet TAKE 1 TABLET BY MOUTH DAILY 08/08/15   Erin Greenspan, MD  cephALEXin (KEFLEX) 500 MG capsule Take 1 capsule (500 mg total) by mouth 4 (four) times daily. 08/25/15   Pattricia Boss, MD   Allergies  Allergen Reactions  . Fluoxetine Hcl Other (See Comments)     vomiting  . Hydrocodone     Nausea and vomiting   . Shellfish Allergy Nausea And Vomiting    Just feels "sick'  . Diclofenac Sodium Other (See Comments)    : vomiting   Review of Systems:  Patient unable to discuss symptoms  Physical Exam  Pleasant elderly female.  Initially very stoic, but eased slightly when her family came in. CV RRR Resp: NAD, CTA Abdomen:  Soft, nt Extremities:  Able to move all four Neuro:  Affect is very flat, lethargic, slow.  Will attempt to follow commands, but only able to follow 1 step commands.  Unable to tell me where she is. Confused.  Vital Signs: BP 122/54 mmHg  Pulse  84  Temp(Src) 97.6 F (36.4 C) (Oral)  Resp 17  Wt 84.7 kg (186 lb 11.7 oz)  SpO2 100% Pain Assessment: PAINAD   Pain Score: 0-No pain   SpO2: SpO2: 100 % O2 Device:SpO2: 100 % O2 Flow Rate: .   IO: Intake/output summary:  Intake/Output Summary (Last 24 hours) at 08/28/15 1652 Last data filed at 08/28/15 1434  Gross per 24 hour  Intake    840 ml  Output      0 ml  Net    840 ml    LBM: Last BM Date:  (pt does not know ) Baseline Weight: Weight: 84.9 kg (187 lb 2.7 oz) Most recent weight: Weight: 84.7 kg (186 lb 11.7 oz)     Palliative Assessment/Data:   Flowsheet Rows        Most Recent Value   Intake Tab    Referral Department  Hospitalist   Unit at Time of Referral  Med/Surg Unit   Palliative Care Primary Diagnosis  Other (Comment)   Date Notified  08/27/15   Palliative Care Type  New Palliative care   Reason for referral  Clarify Goals of Care   Date of Admission  08/25/15   Date first seen by Palliative Care  08/28/15   # of days Palliative referral response time  1 Day(s)   # of days IP prior to Palliative referral  2   Clinical Assessment    Psychosocial & Spiritual Assessment    Palliative Care Outcomes       Time  In: 12:30 Time Out: 1:40 Time Total: 70 min. Greater than 50%  of this time was spent counseling and coordinating care related to the above assessment and plan.  Signed by: Melton Alar, PA-C   Please contact Palliative Medicine Team phone at [phone] for questions and concerns.  For individual provider: See Shea Evans

## 2015-08-28 NOTE — Progress Notes (Signed)
Patient was sitting on the [name] and leaned over and vomited on the floor. Patient had not complained of nausea at all today. PRN zofran order received from Dr. Allyson Sabal. MD believes it's from the patient being constipated. Will administer suppository or enema after supper.   [name], RN.

## 2015-08-28 NOTE — Progress Notes (Signed)
Interval History:                                                                                                                      VANNYA [name] is an 82 y.o. female patient with  One week of decreased mental status. Repeat UC is pending.    Past Medical History: Past Medical History  Diagnosis Date  . Diabetes mellitus   . Hyperlipidemia   . Osteoarthritis   . Osteoporosis   . Hypertension   . Obesity   . Upper GI bleed     AV malformation/when anticoag  . Sacroiliac joint dysfunction 04/01/2012    Past Surgical History  Procedure Laterality Date  . Appendectomy    . Cholecystectomy    . Colon resection  1988    secondary to cancer  . Polypectomy  1998    benign X 2  . Cervicitis  1964    conization   . Total knee arthroplasty  07/2009    right   . Endometrial polyp  05/2000    hyperplasia-laser treatment  . Joint replacement      Rt knee replacement    Family History: Family History  Problem Relation Age of Onset  . Stroke Father   . Cancer Brother     lung    Social History:   reports that she quit smoking about 53 years ago. Her smoking use included Cigarettes. She has never used smokeless tobacco. She reports that she does not drink alcohol or use illicit drugs.  Allergies:  Allergies  Allergen Reactions  . Fluoxetine Hcl Other (See Comments)     vomiting  . Hydrocodone     Nausea and vomiting   . Shellfish Allergy Nausea And Vomiting    Just feels "sick'  . Diclofenac Sodium Other (See Comments)    : vomiting     Medications:                                                                                                                         Current facility-administered medications:  .   stroke: mapping our early stages of recovery book, , Does not apply, Once, Reyne Dumas, MD .  aspirin suppository 300 mg, 300 mg, Rectal, Daily, Reyne Dumas, MD .  aspirin tablet 325 mg, 325 mg, Oral, Daily, Reyne Dumas, MD, 325 mg at 08/27/15 0923 .   atorvastatin (LIPITOR) tablet 10 mg, 10 mg, Oral, q1800, Reyne Dumas, MD, 10 mg  at 08/27/15 1813 .  docusate sodium (COLACE) capsule 100 mg, 100 mg, Oral, BID, Reyne Dumas, MD, 100 mg at 08/27/15 2119 .  enoxaparin (LOVENOX) injection 40 mg, 40 mg, Subcutaneous, Q24H, Reyne Dumas, MD, 40 mg at 08/27/15 2119 .  insulin aspart (novoLOG) injection 0-5 Units, 0-5 Units, Subcutaneous, QHS, Reyne Dumas, MD, 0 Units at 08/25/15 2200 .  insulin aspart (novoLOG) injection 0-9 Units, 0-9 Units, Subcutaneous, TID WC, Reyne Dumas, MD, 1 Units at 08/27/15 1125 .  metoprolol (LOPRESSOR) tablet 50 mg, 50 mg, Oral, BID, Reyne Dumas, MD .  pantoprazole (PROTONIX) EC tablet 40 mg, 40 mg, Oral, Daily, Reyne Dumas, MD, 40 mg at 08/27/15 0923 .  polyethylene glycol (MIRALAX / GLYCOLAX) packet 17 g, 17 g, Oral, Daily, Reyne Dumas, MD, 17 g at 08/27/15 0924 .  potassium chloride SA (K-DUR,KLOR-CON) CR tablet 40 mEq, 40 mEq, Oral, Once, Reyne Dumas, MD .  sodium chloride flush (NS) 0.9 % injection 3 mL, 3 mL, Intravenous, Q12H, Reyne Dumas, MD, 3 mL at 08/27/15 1121   Neurologic Examination:                                                                                                     Today's Vitals   08/27/15 2109 08/28/15 0155 08/28/15 0444 08/28/15 0758  BP: 157/67 140/66 142/93 139/66  Pulse: 87 90 89 108  Temp: 98.4 F (36.9 C) 98.1 F (36.7 C) 98.2 F (36.8 C) 99.1 F (37.3 C)  TempSrc: Oral Oral Axillary Axillary  Resp: 20 16 18 17   Weight:   84.7 kg (186 lb 11.7 oz)   SpO2: 100% 98% 100% 98%  PainSc:        Mental Status: Alert, slow to respond, has a masked facial appearance, bradykinesia, and cannot follow commands well. Could not tell me the month or name a pen without sever prolonged response.  Cranial Nerves: II: Visual fields grossly normal, pupils equal, round, reactive to light and accommodation III,IV, VI: ptosis not present, extra-ocular motions intact bilaterally V,VII:  smile symmetric, facial light touch sensation normal bilaterally VIII: hearing normal bilaterally IX,X: uvula rises symmetrically XI: bilateral shoulder shrug XII: midline tongue extension Motor: 4/5 throughout, increased tone throughout, slight cog wheeling and significant bradykinesia Sensory: Pinprick and light touch intact throughout, bilaterally Deep Tendon Reflexes: 1+ and symmetric throughout UE and no LE DTR Plantars: Right: downgoingLeft: downgoing Cerebellar: normal finger-to-nose, Gait: not tested but told she walks with a walker and has a short shuffled gait.  Lab Results: Basic Metabolic Panel:  Recent Labs Lab 08/25/15 1228 08/25/15 1718 08/26/15 0211 08/28/15 0525  NA 139  --  140 139  K 3.5  --  3.5 3.3*  CL 100*  --  105 103  CO2 25  --  21* 22  GLUCOSE 103*  --  97 110*  BUN 9  --  10 <5*  CREATININE 1.20*  --  1.08* 0.88  CALCIUM 10.3  --  9.4 9.2  MG  --  1.3*  --   --     Liver Function Tests:  Recent Labs Lab 08/25/15 1718 08/26/15 0211 08/28/15 0525  AST 28 28 27   ALT 14 14 14   ALKPHOS 57 56 60  BILITOT 1.1 0.9 0.8  PROT 8.0 7.5 8.0  ALBUMIN 3.8 3.6 3.5   No results for input(s): LIPASE, AMYLASE in the last 168 hours.  Recent Labs Lab 08/27/15 1334  AMMONIA 24    CBC:  Recent Labs Lab 08/25/15 1220 08/26/15 0211 08/28/15 0525  WBC 7.3 7.3 6.2  NEUTROABS 4.5  --   --   HGB 13.8 12.5 13.3  HCT 41.6 39.1 40.2  MCV 88.7 88.7 86.6  PLT 166 157 182    Cardiac Enzymes:  Recent Labs Lab 08/25/15 1718 08/25/15 1901 08/25/15 2031 08/26/15 0211 08/26/15 1007 08/27/15 1127  CKTOTAL  --   --   --   --   --  118  TROPONINI 0.04* 0.04* 0.04* 0.04* 0.05*  --     Lipid Panel:  Recent Labs Lab 08/26/15 0211  CHOL 128  TRIG 86  HDL 42  CHOLHDL 3.0  VLDL 17  LDLCALC 69    CBG:  Recent Labs Lab 08/27/15 0804 08/27/15 1107 08/27/15 1759 08/27/15 2114 08/28/15 0740  GLUCAP 110*  134* 105* 121* 109*    Microbiology: Results for orders placed or performed during the hospital encounter of 08/25/15  Urine culture     Status: Abnormal   Collection Time: 08/25/15 12:25 PM  Result Value Ref Range Status   Specimen Description URINE, CATHETERIZED  Final   Special Requests NONE  Final   Culture MULTIPLE SPECIES PRESENT, SUGGEST RECOLLECTION (A)  Final   Report Status 08/27/2015 FINAL  Final    Imaging: Dg Chest 1 View  08/25/2015  CLINICAL DATA:  Altered mental status for 2 days EXAM: CHEST 1 VIEW COMPARISON:  April 16, 2012 FINDINGS: There is slight scarring in the right apex. Lungs elsewhere are clear. Heart size and pulmonary vascularity are normal. No adenopathy. No pneumothorax. No bone lesions. IMPRESSION: Mild scarring right apex.  No edema or consolidation. Electronically Signed   By: Lowella Grip III M.D.   On: 08/25/2015 13:04   Dg Pelvis 1-2 Views  08/25/2015  CLINICAL DATA:  Pain.  Altered mental status EXAM: PELVIS - 1-2 VIEW COMPARISON:  None. FINDINGS: There is no evidence of pelvic fracture or dislocation. There is mild symmetric narrowing of both hip joints. There is degenerative change in the visualized lumbar spine. No erosive change. IMPRESSION: Areas of osteoarthritic change.  No fracture or dislocation. Electronically Signed   By: Lowella Grip III M.D.   On: 08/25/2015 13:05   Dg Abd 1 View  08/25/2015  CLINICAL DATA:  Abdominal pain and altered mental status EXAM: ABDOMEN - 1 VIEW COMPARISON:  CT abdomen and pelvis January 03, 2014 FINDINGS: There is fairly diffuse stool throughout the colon. There is no bowel dilatation or air-fluid level suggesting obstruction. No free air is evident on this supine examination. There is degenerative change in the lumbar spine. IMPRESSION: Fairly diffuse stool throughout colon. Bowel gas pattern unremarkable. No demonstrable obstruction or free air. Electronically Signed   By: Lowella Grip III M.D.    On: 08/25/2015 13:05   Ct Head Wo Contrast  08/25/2015  CLINICAL DATA:  Altered mental status for 3 days, initial encounter EXAM: CT HEAD WITHOUT CONTRAST TECHNIQUE: Contiguous axial images were obtained from the base of the skull through the vertex without intravenous contrast. COMPARISON:  04/03/2012 FINDINGS: Bony calvarium is intact. No gross  soft tissue abnormality is noted. Mild atrophic and chronic white matter ischemic changes are seen. No findings to suggest acute hemorrhage, acute infarction or space-occupying mass lesion are noted. IMPRESSION: Chronic atrophic and ischemic changes.  No acute abnormality noted. Electronically Signed   By: Inez Catalina M.D.   On: 08/25/2015 14:45   Mr Brain Wo Contrast  08/26/2015  CLINICAL DATA:  Lethargy, mental slowness, last seen normal 1 week ago. Delayed speech. History of diabetes, hypertension, hyperlipidemia. EXAM: MRI HEAD WITHOUT CONTRAST TECHNIQUE: Multiplanar, multiecho pulse sequences of the brain and surrounding structures were obtained without intravenous contrast. Coronal diffusion weighted imaging, axial of T1 and coronal T2 sequences not obtained due to patient's inability to remain still. COMPARISON:  CT HEAD Aug 25, 2015 at 1427 hours FINDINGS: Sequences are moderately or severely motion degraded. INTRACRANIAL CONTENTS: No reduced diffusion to suggest acute ischemia. No large area susceptibility artifact though, axial SWAN sequence is severely motion degraded. The ventricles and sulci are normal for patient's age. No abnormal parenchymal signal, mass lesions, mass effect. Patchy to confluent supratentorial and pontine white matter FLAIR T2 hyperintensities. No abnormal extra-axial fluid collections. No extra-axial masses though, contrast enhanced sequences would be more sensitive. Normal major intracranial vascular flow voids present at skull base. Asymmetric fullness RIGHT carotid terminus inferiorly directed measuring up to 6 mm. ORBITS: The  included ocular globes and orbital contents are non-suspicious. SINUSES: The mastoid air-cells and included paranasal sinuses are well-aerated. SKULL/SOFT TISSUES: No abnormal sellar expansion. No suspicious calvarial bone marrow signal. Craniocervical junction maintained. IMPRESSION: Limited motion degraded abbreviated MRI of the brain. No acute ischemia. Moderate to severe white matter changes compatible chronic small vessel ischemic disease. Possible 6 mm RIGHT carotid terminus aneurysm would be better characterized on MRA versus CTA HEAD on a nonemergent basis. Electronically Signed   By: Elon Alas M.D.   On: 08/26/2015 01:55    EEG: shows no epileptiform activity and is slow.   Assessment and plan:   Erin [name] is an 82 y.o. female patient with one week AMS in setting of possible UTI. EEG shows slowing but no epileptiform activity. Repeat UC pending. Suspect encephalopathy insetting of possible infection and underlying neurocognitive decline.   Recommend: 1) treat underlying infection.  2) consult IR for 13mm right carotid aneurysm as when they are 39mm usually need intervention.    Will add amantadine 100 mg daily at breakfast to see if she has any benefit.

## 2015-08-28 NOTE — Progress Notes (Signed)
[facility] PROGRESS NOTE  SYMPHANI [name] KMQ:[id number] DOB: December 12, 1928 DOA: 08/25/2015   PCP: [name], MD     Assessment/Plan: Principal Problem:   UTI (lower urinary tract infection) Active Problems:   Hyperlipidemia LDL goal <100   Essential hypertension   DM2 (diabetes mellitus, type 2) (HCC)   Risk for falls   82 year old female with a history of diabetes, dyslipidemia, hypertension, morbid obesity, fairly independent at home, and living alone with her niece who is her POA and her caregiver, brought in today to the ER because of lethargy, mental slowness, poor appetite. The patient was last seen normal almost a week ago. Patient's niece who currently provides most of the history states that the patient had some nausea and dry heaving on Tuesday, subsequently developed urinary incontinence and Thursday. She has been extremely slow with her speech and gait and unable to get out of bed. Patient is able to speak but her speech is extremely delayed. Patient is on aspirin 325 mg a day. No recent history of falls. In the ER the CT of the head does not show any acute abnormality. The patient does have a mild urinary tract infection. Chest x-ray negative. Low-grade fever of 99.2, hypotensive with systolic in the 165B. Creatinine 1.2.  Assessment and plan UTI (lower urinary tract infection)-patient started on Rocephin, low-grade fever and recent onset of symptoms consistent with urinary tract infection on admission  urine culture, blood culture  no growth so far, repeat UA pending  Therefore discontinued Rocephin after discussion with the niece  Acute encephalopathy, metabolic/neurologic? 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,  2-D echo shows EF of 50-55%, carotid Doppler within normal limits, continue aspirin   PT OT evaluation recommended SNF , speech therapy  evaluation Lipid panel-LDL 69 Unclear as to why patient has declined over the last 1 week  Ammonia, vitamin B-12, ESR, TSH all within normal limits except ESR of 72 Palliative care consultation for goals of care  EEG shows slowing but no epileptiform activity, neurology consult for cognitive decline in last 6 months ,especially in last one week , MD recommendations pending since 5/17 consult IR for 29m right carotid aneurysm per neuro   Hyperlipidemia LDL goal <100-LDL 69, continue statin   Essential hypertension/hypertensive , resume antihypertensive medications and increase metoprolol   DM2 (diabetes mellitus, type 2) (HCC)-hemoglobin A1c 6.2  Hypokalemia-replete,  Magnesium 1.4  Elevated ESR-patient not complaining of headache or joint pain, CK normal, doubt polymyalgia rheumatica,   Risk for falls-fall precautions   DVT prophylaxsis Lovenox  Code Status:  Full code   Family Communication: Discussed in detail with the patient's niece Erin [name] on 5/15, 5/17 , all imaging results, lab results explained to the patient   Disposition Plan:  Anticipate discharge to SNF tomorrow 5/19.    Consultants: Palliative care   Procedures:  None  Antibiotics: Rocephin-5/15       HPI/Subjective:  continues to have slow speech , sitting up in the chair   Objective: Filed Vitals:   08/27/15 2109 08/28/15 0155 08/28/15 0444 08/28/15 0758  BP: 157/67 140/66 142/93 139/66  Pulse: 87 90 89 108  Temp: 98.4 F (36.9 C) 98.1 F (36.7 C) 98.2 F (36.8 C) 99.1 F (37.3 C)  TempSrc: Oral Oral Axillary Axillary  Resp: _0 Weight:   84.7 kg (186 lb 11.7 oz)  SpO2: 100% 98% 100% 98%    Intake/Output Summary (Last 24 hours) at 08/28/15 0837 Last data filed at 08/28/15 7124  Gross per 24 hour  Intake    600 ml  Output      0 ml  Net    600 ml    Exam:  Examination:  General exam: Appears calm and comfortable  Respiratory system: Clear to auscultation.  Respiratory effort normal. Cardiovascular system: S1 & S2 heard, RRR. No JVD, murmurs, rubs, gallops or clicks. No pedal edema. Gastrointestinal system: Abdomen is nondistended, soft and nontender. No organomegaly or masses felt. Normal bowel sounds heard. Central nervous system: Alert and oriented. No focal neurological deficits. Extremities: Symmetric 5 x 5 power. Skin: No rashes, lesions or ulcers Psychiatry: Judgement and insight appear normal. Mood & affect appropriate.     Data Reviewed: I have personally reviewed following labs and imaging studies  Micro Results Recent Results (from the past 240 hour(s))  Urine culture     Status: Abnormal   Collection Time: 08/25/15 12:25 PM  Result Value Ref Range Status   Specimen Description URINE, CATHETERIZED  Final   Special Requests NONE  Final   Culture MULTIPLE SPECIES PRESENT, SUGGEST RECOLLECTION (A)  Final   Report Status 08/27/2015 FINAL  Final    Radiology Reports Dg Chest 1 View  08/25/2015  CLINICAL DATA:  Altered mental status for 2 days EXAM: CHEST 1 VIEW COMPARISON:  April 16, 2012 FINDINGS: There is slight scarring in the right apex. Lungs elsewhere are clear. Heart size and pulmonary vascularity are normal. No adenopathy. No pneumothorax. No bone lesions. IMPRESSION: Mild scarring right apex.  No edema or consolidation. Electronically Signed   By: Lowella Grip III M.D.   On: 08/25/2015 13:04   Dg Pelvis 1-2 Views  08/25/2015  CLINICAL DATA:  Pain.  Altered mental status EXAM: PELVIS - 1-2 VIEW COMPARISON:  None. FINDINGS: There is no evidence of pelvic fracture or dislocation. There is mild symmetric narrowing of both hip joints. There is degenerative change in the visualized lumbar spine. No erosive change. IMPRESSION: Areas of osteoarthritic change.  No fracture or dislocation. Electronically Signed   By: Lowella Grip III M.D.   On: 08/25/2015 13:05   Dg Abd 1 View  08/25/2015  CLINICAL DATA:  Abdominal pain  and altered mental status EXAM: ABDOMEN - 1 VIEW COMPARISON:  CT abdomen and pelvis January 03, 2014 FINDINGS: There is fairly diffuse stool throughout the colon. There is no bowel dilatation or air-fluid level suggesting obstruction. No free air is evident on this supine examination. There is degenerative change in the lumbar spine. IMPRESSION: Fairly diffuse stool throughout colon. Bowel gas pattern unremarkable. No demonstrable obstruction or free air. Electronically Signed   By: Lowella Grip III M.D.   On: 08/25/2015 13:05   Ct Head Wo Contrast  08/25/2015  CLINICAL DATA:  Altered mental status for 3 days, initial encounter EXAM: CT HEAD WITHOUT CONTRAST TECHNIQUE: Contiguous axial images were obtained from the base of the skull through the vertex without intravenous contrast. COMPARISON:  04/03/2012 FINDINGS: Bony calvarium is intact. No gross soft tissue abnormality is noted. Mild atrophic and chronic white matter ischemic changes are seen. No findings to suggest acute hemorrhage, acute infarction or space-occupying mass lesion are noted. IMPRESSION: Chronic atrophic and ischemic changes.  No acute abnormality noted. Electronically Signed   By: Inez Catalina M.D.   On: 08/25/2015 14:45   Mr Brain Wo Contrast  08/26/2015  CLINICAL  DATA:  Lethargy, mental slowness, last seen normal 1 week ago. Delayed speech. History of diabetes, hypertension, hyperlipidemia. EXAM: MRI HEAD WITHOUT CONTRAST TECHNIQUE: Multiplanar, multiecho pulse sequences of the brain and surrounding structures were obtained without intravenous contrast. Coronal diffusion weighted imaging, axial of T1 and coronal T2 sequences not obtained due to patient's inability to remain still. COMPARISON:  CT HEAD Aug 25, 2015 at 1427 hours FINDINGS: Sequences are moderately or severely motion degraded. INTRACRANIAL CONTENTS: No reduced diffusion to suggest acute ischemia. No large area susceptibility artifact though, axial SWAN sequence is  severely motion degraded. The ventricles and sulci are normal for patient's age. No abnormal parenchymal signal, mass lesions, mass effect. Patchy to confluent supratentorial and pontine white matter FLAIR T2 hyperintensities. No abnormal extra-axial fluid collections. No extra-axial masses though, contrast enhanced sequences would be more sensitive. Normal major intracranial vascular flow voids present at skull base. Asymmetric fullness RIGHT carotid terminus inferiorly directed measuring up to 6 mm. ORBITS: The included ocular globes and orbital contents are non-suspicious. SINUSES: The mastoid air-cells and included paranasal sinuses are well-aerated. SKULL/SOFT TISSUES: No abnormal sellar expansion. No suspicious calvarial bone marrow signal. Craniocervical junction maintained. IMPRESSION: Limited motion degraded abbreviated MRI of the brain. No acute ischemia. Moderate to severe white matter changes compatible chronic small vessel ischemic disease. Possible 6 mm RIGHT carotid terminus aneurysm would be better characterized on MRA versus CTA HEAD on a nonemergent basis. Electronically Signed   By: Elon Alas M.D.   On: 08/26/2015 01:55     CBC  Recent Labs Lab 08/25/15 1220 08/26/15 0211 08/28/15 0525  WBC 7.3 7.3 6.2  HGB 13.8 12.5 13.3  HCT 41.6 39.1 40.2  PLT 166 157 182  MCV 88.7 88.7 86.6  MCH 29.4 28.3 28.7  MCHC 33.2 32.0 33.1  RDW 14.4 14.3 14.1  LYMPHSABS 2.0  --   --   MONOABS 0.7  --   --   EOSABS 0.1  --   --   BASOSABS 0.0  --   --     Chemistries   Recent Labs Lab 08/25/15 1228 08/25/15 1718 08/26/15 0211 08/28/15 0525  NA 139  --  140 139  K 3.5  --  3.5 3.3*  CL 100*  --  105 103  CO2 25  --  21* 22  GLUCOSE 103*  --  97 110*  BUN 9  --  10 <5*  CREATININE 1.20*  --  1.08* 0.88  CALCIUM 10.3  --  9.4 9.2  MG  --  1.3*  --   --   AST  --  _0 ALT  --  _1 ALKPHOS  --  57 56 60  BILITOT  --  1.1 0.9 0.8    ------------------------------------------------------------------------------------------------------------------ CrCl cannot be calculated (Unknown ideal weight.). ------------------------------------------------------------------------------------------------------------------  Recent Labs  08/25/15 1719  HGBA1C 6.2*   ------------------------------------------------------------------------------------------------------------------  Recent Labs  08/26/15 0211  CHOL 128  HDL 42  LDLCALC 69  TRIG 86  CHOLHDL 3.0   ------------------------------------------------------------------------------------------------------------------  Recent Labs  08/25/15 1719  TSH 1.216   ------------------------------------------------------------------------------------------------------------------  Recent Labs  08/27/15 1127  VITAMINB12 805    Coagulation profile  Recent Labs Lab 08/25/15 1808  INR 1.13    No results for input(s): DDIMER in the last 72 hours.  Cardiac Enzymes  Recent Labs Lab 08/25/15 2031 08/26/15 0211 08/26/15 1007  TROPONINI 0.04* 0.04* 0.05*   ------------------------------------------------------------------------------------------------------------------ Invalid input(s): POCBNP   CBG:  Recent  Labs Lab 08/26/15 2236 08/27/15 0804 08/27/15 1107 08/27/15 1759 08/27/15 2114  GLUCAP 86 110* 134* 105* 121*       Studies: No results found.    Lab Results  Component Value Date   HGBA1C 6.2* 08/25/2015   HGBA1C 6.3 07/23/2015   HGBA1C 6.5 01/20/2015   Lab Results  Component Value Date   MICROALBUR 1.7 01/23/2014   LDLCALC 69 08/26/2015   CREATININE 0.88 08/28/2015       Scheduled Meds: .  stroke: mapping our early stages of recovery book   Does not apply Once  . aspirin  300 mg Rectal Daily  . aspirin  325 mg Oral Daily  . atorvastatin  10 mg Oral q1800  . docusate sodium  100 mg Oral BID  . enoxaparin (LOVENOX)  injection  40 mg Subcutaneous Q24H  . insulin aspart  0-5 Units Subcutaneous QHS  . insulin aspart  0-9 Units Subcutaneous TID WC  . metoprolol  12.5 mg Oral BID  . pantoprazole  40 mg Oral Daily  . polyethylene glycol  17 g Oral Daily  . sodium chloride flush  3 mL Intravenous Q12H   Continuous Infusions: . sodium chloride 75 mL/hr at 08/27/15 1121     LOS: 3 days    Time spent: >30 MINS    [facility]  [facility] Pager [phone]. If 7PM-7AM, please contact night-coverage at [email or web address], password [facility] 08/28/2015, 8:37 AM  LOS: 3 days

## 2015-08-29 DIAGNOSIS — I720 Aneurysm of carotid artery: Secondary | ICD-10-CM | POA: Diagnosis not present

## 2015-08-29 DIAGNOSIS — G9340 Encephalopathy, unspecified: Secondary | ICD-10-CM | POA: Diagnosis not present

## 2015-08-29 DIAGNOSIS — E1159 Type 2 diabetes mellitus with other circulatory complications: Secondary | ICD-10-CM | POA: Diagnosis not present

## 2015-08-29 DIAGNOSIS — K219 Gastro-esophageal reflux disease without esophagitis: Secondary | ICD-10-CM | POA: Diagnosis not present

## 2015-08-29 DIAGNOSIS — Z7189 Other specified counseling: Secondary | ICD-10-CM

## 2015-08-29 DIAGNOSIS — I729 Aneurysm of unspecified site: Secondary | ICD-10-CM | POA: Diagnosis not present

## 2015-08-29 DIAGNOSIS — K5901 Slow transit constipation: Secondary | ICD-10-CM | POA: Diagnosis not present

## 2015-08-29 DIAGNOSIS — R262 Difficulty in walking, not elsewhere classified: Secondary | ICD-10-CM | POA: Diagnosis not present

## 2015-08-29 DIAGNOSIS — N952 Postmenopausal atrophic vaginitis: Secondary | ICD-10-CM | POA: Diagnosis not present

## 2015-08-29 DIAGNOSIS — I639 Cerebral infarction, unspecified: Secondary | ICD-10-CM | POA: Diagnosis not present

## 2015-08-29 DIAGNOSIS — N390 Urinary tract infection, site not specified: Secondary | ICD-10-CM | POA: Diagnosis not present

## 2015-08-29 DIAGNOSIS — E785 Hyperlipidemia, unspecified: Secondary | ICD-10-CM | POA: Diagnosis not present

## 2015-08-29 DIAGNOSIS — I6320 Cerebral infarction due to unspecified occlusion or stenosis of unspecified precerebral arteries: Secondary | ICD-10-CM | POA: Diagnosis not present

## 2015-08-29 DIAGNOSIS — F0280 Dementia in other diseases classified elsewhere without behavioral disturbance: Secondary | ICD-10-CM | POA: Diagnosis not present

## 2015-08-29 DIAGNOSIS — R110 Nausea: Secondary | ICD-10-CM | POA: Diagnosis not present

## 2015-08-29 DIAGNOSIS — R5381 Other malaise: Secondary | ICD-10-CM | POA: Diagnosis not present

## 2015-08-29 DIAGNOSIS — R278 Other lack of coordination: Secondary | ICD-10-CM | POA: Diagnosis not present

## 2015-08-29 DIAGNOSIS — R2681 Unsteadiness on feet: Secondary | ICD-10-CM | POA: Diagnosis not present

## 2015-08-29 DIAGNOSIS — I10 Essential (primary) hypertension: Secondary | ICD-10-CM | POA: Diagnosis not present

## 2015-08-29 DIAGNOSIS — R531 Weakness: Secondary | ICD-10-CM | POA: Diagnosis not present

## 2015-08-29 DIAGNOSIS — K648 Other hemorrhoids: Secondary | ICD-10-CM | POA: Diagnosis not present

## 2015-08-29 DIAGNOSIS — R1310 Dysphagia, unspecified: Secondary | ICD-10-CM | POA: Diagnosis not present

## 2015-08-29 DIAGNOSIS — N3281 Overactive bladder: Secondary | ICD-10-CM | POA: Diagnosis not present

## 2015-08-29 DIAGNOSIS — I6300 Cerebral infarction due to thrombosis of unspecified precerebral artery: Secondary | ICD-10-CM | POA: Diagnosis not present

## 2015-08-29 DIAGNOSIS — Z780 Asymptomatic menopausal state: Secondary | ICD-10-CM | POA: Diagnosis not present

## 2015-08-29 DIAGNOSIS — M542 Cervicalgia: Secondary | ICD-10-CM | POA: Diagnosis not present

## 2015-08-29 DIAGNOSIS — R269 Unspecified abnormalities of gait and mobility: Secondary | ICD-10-CM | POA: Diagnosis not present

## 2015-08-29 DIAGNOSIS — Z8673 Personal history of transient ischemic attack (TIA), and cerebral infarction without residual deficits: Secondary | ICD-10-CM | POA: Insufficient documentation

## 2015-08-29 DIAGNOSIS — G309 Alzheimer's disease, unspecified: Secondary | ICD-10-CM | POA: Diagnosis not present

## 2015-08-29 DIAGNOSIS — E1100 Type 2 diabetes mellitus with hyperosmolarity without nonketotic hyperglycemic-hyperosmolar coma (NKHHC): Secondary | ICD-10-CM | POA: Diagnosis not present

## 2015-08-29 DIAGNOSIS — R1311 Dysphagia, oral phase: Secondary | ICD-10-CM | POA: Diagnosis not present

## 2015-08-29 DIAGNOSIS — B373 Candidiasis of vulva and vagina: Secondary | ICD-10-CM | POA: Diagnosis not present

## 2015-08-29 DIAGNOSIS — M6281 Muscle weakness (generalized): Secondary | ICD-10-CM | POA: Diagnosis not present

## 2015-08-29 DIAGNOSIS — R627 Adult failure to thrive: Secondary | ICD-10-CM | POA: Diagnosis not present

## 2015-08-29 DIAGNOSIS — F0390 Unspecified dementia without behavioral disturbance: Secondary | ICD-10-CM | POA: Diagnosis not present

## 2015-08-29 DIAGNOSIS — G20 Parkinson's disease: Secondary | ICD-10-CM | POA: Diagnosis not present

## 2015-08-29 LAB — BASIC METABOLIC PANEL
Anion gap: 11 (ref 5–15)
BUN: 6 mg/dL (ref 4–21)
BUN: 6 mg/dL (ref 6–20)
CALCIUM: 9.6 mg/dL (ref 8.9–10.3)
CHLORIDE: 102 mmol/L (ref 101–111)
CO2: 26 mmol/L (ref 22–32)
CREATININE: 1 mg/dL (ref 0.5–1.1)
CREATININE: 0.99 mg/dL (ref 0.44–1.00)
GFR calc non Af Amer: 51 mL/min — ABNORMAL LOW (ref >60)
GFR, EST AFRICAN AMERICAN: 59 mL/min — AB (ref >60)
Glucose, Bld: 91 mg/dL (ref 65–99)
Glucose: 91 mg/dL
Potassium: 3.8 mmol/L (ref 3.5–5.1)
SODIUM: 139 mmol/L (ref 137–147)
Sodium: 139 mmol/L (ref 135–145)

## 2015-08-29 LAB — URINE CULTURE: Culture: 2000 — AB

## 2015-08-29 LAB — GLUCOSE, CAPILLARY
Glucose-Capillary: 100 mg/dL — ABNORMAL HIGH (ref 65–99)
Glucose-Capillary: 134 mg/dL — ABNORMAL HIGH (ref 65–99)

## 2015-08-29 MED ORDER — PANTOPRAZOLE SODIUM 40 MG PO TBEC: 40.0000 mg | DELAYED_RELEASE_TABLET | Freq: Every day | ORAL | Status: DC

## 2015-08-29 MED ORDER — METOPROLOL TARTRATE 50 MG PO TABS: 50.0000 mg | ORAL_TABLET | Freq: Two times a day (BID) | ORAL | Status: DC

## 2015-08-29 MED ORDER — AMANTADINE HCL 100 MG PO CAPS: 100.0000 mg | ORAL_CAPSULE | Freq: Every morning | ORAL | Status: DC

## 2015-08-29 NOTE — Progress Notes (Signed)
Patient ID: Erin [name], female   DOB: [birth year], 82 y.o.   MRN: [id number] Patient's chart has been reviewed.  It appears she is being discharged to SNF today per the primary service's note.  Her cerebral aneurysm was an incidental finding.  It is possible the patient may end up pursuing palliative care.  Given all of this, we will hold off on an inpatient angiogram.  She may follow up with Dr. Estanislado Pandy as an outpatient if the family chooses to pursue further care for this aneurysm.  This has been discussed with Dr. Estanislado Pandy.  Owais Pruett E 8:29 AM 08/29/2015

## 2015-08-29 NOTE — Progress Notes (Signed)
Pt ready for discharge to [facility] . Pt. Is alert. Pt is hemodynamically stable. IV removed. Transport called. Discharge plan appropriate and in place.

## 2015-08-29 NOTE — Clinical Social Work Placement (Signed)
   CLINICAL SOCIAL WORK PLACEMENT  NOTE  Date:  08/29/2015  Patient Details  Name: Erin [name] MRN: [id number] Date of Birth: 09/01/1928  Clinical Social Work is seeking post-discharge placement for this patient at the [place] level of care (*CSW will initial, date and re-position this form in  chart as items are completed):  Yes   Patient/family provided with [facility] Work Department's list of facilities offering this level of care within the geographic area requested by the patient (or if unable, by the patient's family).  Yes   Patient/family informed of their freedom to choose among providers that offer the needed level of care, that participate in Medicare, Medicaid or managed care program needed by the patient, have an available bed and are willing to accept the patient.  Yes   Patient/family informed of [facility]'s ownership interest in [facility] and [facility], as well as of the fact that they are under no obligation to receive care at these facilities.  PASRR submitted to EDS on 08/27/15     PASRR number received on 08/27/15     Existing PASRR number confirmed on       FL2 transmitted to all facilities in geographic area requested by pt/family on 08/27/15     FL2 transmitted to all facilities within larger geographic area on       Patient informed that his/her managed care company has contracts with or will negotiate with certain facilities, including the following:        Yes   Patient/family informed of bed offers received.  Patient chooses bed at [facility]     Physician recommends and patient chooses bed at      Patient to be transferred to [facility] on 08/28/15.  Patient to be transferred to facility by [facility]     Patient family notified on 08/29/15 of transfer.  Name of family member notified:  Niece, [name]     PHYSICIAN Please sign FL2     Additional Comment:     _______________________________________________ Dulcy Fanny, LCSW 08/29/2015, 12:25 PM

## 2015-08-29 NOTE — Progress Notes (Signed)
Spoke with MD Allyson Sabal concerning neurologist. MD will come to see patient.

## 2015-08-29 NOTE — Plan of Care (Signed)
Problem: Safety: Goal: Ability to remain free from injury will improve Outcome: Progressing Instructed to call for assistance. Call bell within reach. Bed/chair alarm used.  Problem: Health Behavior/Discharge Planning: Goal: Ability to manage health-related needs will improve Outcome: Progressing CSW following to arrange for SNF placement at discharge.  Problem: Skin Integrity: Goal: Risk for impaired skin integrity will decrease Outcome: Progressing No pressure ulcers noted at this time.  Problem: Tissue Perfusion: Goal: Risk factors for ineffective tissue perfusion will decrease Outcome: Completed/Met Date Met:  08/29/15 Receiving SQ Lovenox.  Problem: Activity: Goal: Risk for activity intolerance will decrease Outcome: Progressing Up to chair for several hours yesterday 08/28/15.  Problem: Nutrition: Goal: Adequate nutrition will be maintained Outcome: Progressing Poor appetite, but drinking some fluids.  Problem: Bowel/Gastric: Goal: Will not experience complications related to bowel motility Outcome: Progressing Was previously constipated, but was successful in having a BM after receiving suppository/enema 08/28/15.

## 2015-08-29 NOTE — Progress Notes (Signed)
MD asked RN to hold patient discharge until she is seen by neurologist. No consult order seen for neurologist at this time. Transport has come to pick up patient but will return later. MD Abrol paged to help address this issue so that neurologist can see patient before d/c.

## 2015-08-29 NOTE — Discharge Summary (Signed)
Physician Discharge Summary  Erin [name] MRN: [id number] DOB/AGE: 1928-07-30 82 y.o.  PCP: [name], MD   Admit date: 08/25/2015 Discharge date: 08/29/2015  Discharge Diagnoses:     Principal Problem:   UTI (lower urinary tract infection) Active Problems:   Hyperlipidemia LDL goal <100   Essential hypertension   DM2 (diabetes mellitus, type 2) (HCC)   Risk for falls   Palliative care encounter   Goals of care, counseling/discussion   Slow transit constipation   Aneurysm (HCC)   CVA (cerebral infarction) Parkinson's dementia   Follow-up recommendations Follow-up with PCP in 3-5 days , including all  additional recommended appointments as below Follow-up CBC, CMP in 3-5 days Patient would benefit from following up with neurology in the next 2-3 weeks Recommended patient be continued to be followed by palliative care at [facility]     Current Discharge Medication List    START taking these medications   Details  amantadine (SYMMETREL) 100 MG capsule Take 1 capsule (100 mg total) by mouth every morning. Qty: 30 capsule, Refills: 1    cephALEXin (KEFLEX) 500 MG capsule Take 1 capsule (500 mg total) by mouth 4 (four) times daily. Qty: 20 capsule, Refills: 0    pantoprazole (PROTONIX) 40 MG tablet Take 1 tablet (40 mg total) by mouth daily. Qty: 30 tablet, Refills: 1      CONTINUE these medications which have CHANGED   Details  metoprolol (LOPRESSOR) 50 MG tablet Take 1 tablet (50 mg total) by mouth 2 (two) times daily. Qty: 60 tablet, Refills: 1      CONTINUE these medications which have NOT CHANGED   Details  acetaminophen (TYLENOL) 500 MG tablet Take 1,000 mg by mouth every 4 (four) hours as needed.    aspirin 325 MG tablet Take 325 mg by mouth daily.    calcium carbonate (OS-CAL) 600 MG TABS tablet Take 600 mg by mouth 2 (two) times daily with a meal.    Multiple Vitamins-Minerals (MULTIVITAMIN WITH MINERALS) tablet Take 1 tablet by mouth daily.    vitamin C  (ASCORBIC ACID) 500 MG tablet Take 500 mg by mouth daily.    atorvastatin (LIPITOR) 10 MG tablet TAKE 1 TABLET BY MOUTH DAILY Qty: 30 tablet, Refills: 11      STOP taking these medications     amLODipine (NORVASC) 5 MG tablet      gabapentin (NEURONTIN) 300 MG capsule      imipramine (TOFRANIL) 50 MG tablet      metFORMIN (GLUCOPHAGE) 500 MG tablet      omeprazole (PRILOSEC) 20 MG capsule      sennosides-docusate sodium (SENOKOT-S) 8.6-50 MG tablet          Discharge Condition: Overall prognosis guarded   Discharge Instructions Get Medicines reviewed and adjusted: Please take all your medications with you for your next visit with your Primary MD  Please request your Primary MD to go over all hospital tests and procedure/radiological results at the follow up, please ask your Primary MD to get all Hospital records sent to his/her office.  If you experience worsening of your admission symptoms, develop shortness of breath, life threatening emergency, suicidal or homicidal thoughts you must seek medical attention immediately by calling 911 or calling your MD immediately if symptoms less severe.  You must read complete instructions/literature along with all the possible adverse reactions/side effects for all the Medicines you take and that have been prescribed to you. Take any new Medicines after you have completely understood and  accpet all the possible adverse reactions/side effects.   Do not drive when taking Pain medications.   Do not take more than prescribed Pain, Sleep and Anxiety Medications  Special Instructions: If you have smoked or chewed Tobacco in the last 2 yrs please stop smoking, stop any regular Alcohol and or any Recreational drug use.  Wear Seat belts while driving.  Please note  You were cared for by a hospitalist during your hospital stay. Once you are discharged, your primary care physician will handle any further medical issues. Please note that NO  REFILLS for any discharge medications will be authorized once you are discharged, as it is imperative that you return to your primary care physician (or establish a relationship with a primary care physician if you do not have one) for your aftercare needs so that they can reassess your need for medications and monitor your lab values.  Discharge Instructions    Diet - low sodium heart healthy    Complete by:  As directed      Increase activity slowly    Complete by:  As directed             Allergies  Allergen Reactions  . Fluoxetine Hcl Other (See Comments)     vomiting  . Hydrocodone     Nausea and vomiting   . Shellfish Allergy Nausea And Vomiting    Just feels "sick'  . Diclofenac Sodium Other (See Comments)    : vomiting      Disposition: SNF    Consults: Neurology  Significant Diagnostic Studies:  Dg Chest 1 View  08/25/2015  CLINICAL DATA:  Altered mental status for 2 days EXAM: CHEST 1 VIEW COMPARISON:  April 16, 2012 FINDINGS: There is slight scarring in the right apex. Lungs elsewhere are clear. Heart size and pulmonary vascularity are normal. No adenopathy. No pneumothorax. No bone lesions. IMPRESSION: Mild scarring right apex.  No edema or consolidation. Electronically Signed   By: Lowella Grip III M.D.   On: 08/25/2015 13:04   Dg Pelvis 1-2 Views  08/25/2015  CLINICAL DATA:  Pain.  Altered mental status EXAM: PELVIS - 1-2 VIEW COMPARISON:  None. FINDINGS: There is no evidence of pelvic fracture or dislocation. There is mild symmetric narrowing of both hip joints. There is degenerative change in the visualized lumbar spine. No erosive change. IMPRESSION: Areas of osteoarthritic change.  No fracture or dislocation. Electronically Signed   By: Lowella Grip III M.D.   On: 08/25/2015 13:05   Dg Abd 1 View  08/25/2015  CLINICAL DATA:  Abdominal pain and altered mental status EXAM: ABDOMEN - 1 VIEW COMPARISON:  CT abdomen and pelvis January 03, 2014  FINDINGS: There is fairly diffuse stool throughout the colon. There is no bowel dilatation or air-fluid level suggesting obstruction. No free air is evident on this supine examination. There is degenerative change in the lumbar spine. IMPRESSION: Fairly diffuse stool throughout colon. Bowel gas pattern unremarkable. No demonstrable obstruction or free air. Electronically Signed   By: Lowella Grip III M.D.   On: 08/25/2015 13:05   Ct Head Wo Contrast  08/25/2015  CLINICAL DATA:  Altered mental status for 3 days, initial encounter EXAM: CT HEAD WITHOUT CONTRAST TECHNIQUE: Contiguous axial images were obtained from the base of the skull through the vertex without intravenous contrast. COMPARISON:  04/03/2012 FINDINGS: Bony calvarium is intact. No gross soft tissue abnormality is noted. Mild atrophic and chronic white matter ischemic changes are seen. No findings to  suggest acute hemorrhage, acute infarction or space-occupying mass lesion are noted. IMPRESSION: Chronic atrophic and ischemic changes.  No acute abnormality noted. Electronically Signed   By: Inez Catalina M.D.   On: 08/25/2015 14:45   Mr Brain Wo Contrast  08/26/2015  CLINICAL DATA:  Lethargy, mental slowness, last seen normal 1 week ago. Delayed speech. History of diabetes, hypertension, hyperlipidemia. EXAM: MRI HEAD WITHOUT CONTRAST TECHNIQUE: Multiplanar, multiecho pulse sequences of the brain and surrounding structures were obtained without intravenous contrast. Coronal diffusion weighted imaging, axial of T1 and coronal T2 sequences not obtained due to patient's inability to remain still. COMPARISON:  CT HEAD Aug 25, 2015 at 1427 hours FINDINGS: Sequences are moderately or severely motion degraded. INTRACRANIAL CONTENTS: No reduced diffusion to suggest acute ischemia. No large area susceptibility artifact though, axial SWAN sequence is severely motion degraded. The ventricles and sulci are normal for patient's age. No abnormal parenchymal  signal, mass lesions, mass effect. Patchy to confluent supratentorial and pontine white matter FLAIR T2 hyperintensities. No abnormal extra-axial fluid collections. No extra-axial masses though, contrast enhanced sequences would be more sensitive. Normal major intracranial vascular flow voids present at skull base. Asymmetric fullness RIGHT carotid terminus inferiorly directed measuring up to 6 mm. ORBITS: The included ocular globes and orbital contents are non-suspicious. SINUSES: The mastoid air-cells and included paranasal sinuses are well-aerated. SKULL/SOFT TISSUES: No abnormal sellar expansion. No suspicious calvarial bone marrow signal. Craniocervical junction maintained. IMPRESSION: Limited motion degraded abbreviated MRI of the brain. No acute ischemia. Moderate to severe white matter changes compatible chronic small vessel ischemic disease. Possible 6 mm RIGHT carotid terminus aneurysm would be better characterized on MRA versus CTA HEAD on a nonemergent basis. Electronically Signed   By: Elon Alas M.D.   On: 08/26/2015 01:55       Filed Weights   08/25/15 2049 08/26/15 2100 08/28/15 0444  Weight: 84.9 kg (187 lb 2.7 oz) 84 kg (185 lb 3 oz) 84.7 kg (186 lb 11.7 oz)     Microbiology: Recent Results (from the past 240 hour(s))  Urine culture     Status: Abnormal   Collection Time: 08/25/15 12:25 PM  Result Value Ref Range Status   Specimen Description URINE, CATHETERIZED  Final   Special Requests NONE  Final   Culture MULTIPLE SPECIES PRESENT, SUGGEST RECOLLECTION (A)  Final   Report Status 08/27/2015 FINAL  Final       Blood Culture    Component Value Date/Time   SDES URINE, CATHETERIZED 08/25/2015 1225   SPECREQUEST NONE 08/25/2015 1225   CULT MULTIPLE SPECIES PRESENT, SUGGEST RECOLLECTION* 08/25/2015 1225   REPTSTATUS 08/27/2015 FINAL 08/25/2015 1225      Labs: Results for orders placed or performed during the hospital encounter of 08/25/15 (from the past 48  hour(s))  Ammonia     Status: None   Collection Time: 08/27/15  1:34 PM  Result Value Ref Range   Ammonia 24 9 - 35 umol/L  Glucose, capillary     Status: Abnormal   Collection Time: 08/27/15  5:59 PM  Result Value Ref Range   Glucose-Capillary 105 (H) 65 - 99 mg/dL  Glucose, capillary     Status: Abnormal   Collection Time: 08/27/15  9:14 PM  Result Value Ref Range   Glucose-Capillary 121 (H) 65 - 99 mg/dL  Comprehensive metabolic panel     Status: Abnormal   Collection Time: 08/28/15  5:25 AM  Result Value Ref Range   Sodium 139 135 - 145 mmol/L  Potassium 3.3 (L) 3.5 - 5.1 mmol/L   Chloride 103 101 - 111 mmol/L   CO2 22 22 - 32 mmol/L   Glucose, Bld 110 (H) 65 - 99 mg/dL   BUN <5 (L) 6 - 20 mg/dL   Creatinine, Ser 0.88 0.44 - 1.00 mg/dL   Calcium 9.2 8.9 - 10.3 mg/dL   Total Protein 8.0 6.5 - 8.1 g/dL   Albumin 3.5 3.5 - 5.0 g/dL   AST 27 15 - 41 U/L   ALT 14 14 - 54 U/L   Alkaline Phosphatase 60 38 - 126 U/L   Total Bilirubin 0.8 0.3 - 1.2 mg/dL   GFR calc non Af Amer 58 (L) >60 mL/min   GFR calc Af Amer >60 >60 mL/min    Comment: (NOTE) The eGFR has been calculated using the CKD EPI equation. This calculation has not been validated in all clinical situations. eGFR's persistently <60 mL/min signify possible Chronic Kidney Disease.    Anion gap 14 5 - 15  CBC     Status: None   Collection Time: 08/28/15  5:25 AM  Result Value Ref Range   WBC 6.2 4.0 - 10.5 K/uL   RBC 4.64 3.87 - 5.11 MIL/uL   Hemoglobin 13.3 12.0 - 15.0 g/dL   HCT 40.2 36.0 - 46.0 %   MCV 86.6 78.0 - 100.0 fL   MCH 28.7 26.0 - 34.0 pg   MCHC 33.1 30.0 - 36.0 g/dL   RDW 14.1 11.5 - 15.5 %   Platelets 182 150 - 400 K/uL  Glucose, capillary     Status: Abnormal   Collection Time: 08/28/15  7:40 AM  Result Value Ref Range   Glucose-Capillary 109 (H) 65 - 99 mg/dL  Magnesium     Status: Abnormal   Collection Time: 08/28/15 10:01 AM  Result Value Ref Range   Magnesium 1.4 (L) 1.7 - 2.4 mg/dL   Glucose, capillary     Status: Abnormal   Collection Time: 08/28/15 12:03 PM  Result Value Ref Range   Glucose-Capillary 132 (H) 65 - 99 mg/dL  Glucose, capillary     Status: Abnormal   Collection Time: 08/28/15  4:19 PM  Result Value Ref Range   Glucose-Capillary 125 (H) 65 - 99 mg/dL  Urinalysis, Routine w reflex microscopic (not at [facility])     Status: None   Collection Time: 08/28/15  4:43 PM  Result Value Ref Range   Color, Urine YELLOW YELLOW   APPearance CLEAR CLEAR   Specific Gravity, Urine 1.010 1.005 - 1.030   pH 7.0 5.0 - 8.0   Glucose, UA NEGATIVE NEGATIVE mg/dL   Hgb urine dipstick NEGATIVE NEGATIVE   Bilirubin Urine NEGATIVE NEGATIVE   Ketones, ur NEGATIVE NEGATIVE mg/dL   Protein, ur NEGATIVE NEGATIVE mg/dL   Nitrite NEGATIVE NEGATIVE   Leukocytes, UA NEGATIVE NEGATIVE    Comment: MICROSCOPIC NOT DONE ON URINES WITH NEGATIVE PROTEIN, BLOOD, LEUKOCYTES, NITRITE, OR GLUCOSE <1000 mg/dL.  Glucose, capillary     Status: Abnormal   Collection Time: 08/28/15  9:34 PM  Result Value Ref Range   Glucose-Capillary 102 (H) 65 - 99 mg/dL  Basic metabolic panel     Status: Abnormal   Collection Time: 08/29/15  5:47 AM  Result Value Ref Range   Sodium 139 135 - 145 mmol/L   Potassium 3.8 3.5 - 5.1 mmol/L   Chloride 102 101 - 111 mmol/L   CO2 26 22 - 32 mmol/L   Glucose, Bld 91 65 -  99 mg/dL   BUN 6 6 - 20 mg/dL   Creatinine, Ser 0.99 0.44 - 1.00 mg/dL   Calcium 9.6 8.9 - 10.3 mg/dL   GFR calc non Af Amer 51 (L) >60 mL/min   GFR calc Af Amer 59 (L) >60 mL/min    Comment: (NOTE) The eGFR has been calculated using the CKD EPI equation. This calculation has not been validated in all clinical situations. eGFR's persistently <60 mL/min signify possible Chronic Kidney Disease.    Anion gap 11 5 - 15  Glucose, capillary     Status: Abnormal   Collection Time: 08/29/15  7:42 AM  Result Value Ref Range   Glucose-Capillary 100 (H) 65 - 99 mg/dL  Glucose, capillary     Status:  Abnormal   Collection Time: 08/29/15 11:36 AM  Result Value Ref Range   Glucose-Capillary 134 (H) 65 - 99 mg/dL     Lipid Panel     Component Value Date/Time   CHOL 128 08/26/2015 0211   TRIG 86 08/26/2015 0211   HDL 42 08/26/2015 0211   CHOLHDL 3.0 08/26/2015 0211   VLDL 17 08/26/2015 0211   LDLCALC 69 08/26/2015 0211   LDLDIRECT 39.0 07/23/2015 0810     Lab Results  Component Value Date   HGBA1C 6.2* 08/25/2015   HGBA1C 6.3 07/23/2015   HGBA1C 6.5 01/20/2015     Lab Results  Component Value Date   MICROALBUR 1.7 01/23/2014   LDLCALC 69 08/26/2015   CREATININE 0.99 08/29/2015     82 year old female with a history of diabetes, dyslipidemia, hypertension, morbid obesity, fairly independent at home, and living alone with her niece who is her POA and her caregiver, brought in today to the ER because of lethargy, mental slowness, poor appetite. The patient was last seen normal almost a week ago. Patient's niece who currently provides most of the history states that the patient had some nausea and dry heaving on Tuesday, subsequently developed urinary incontinence and Thursday. She has been extremely slow with her speech and gait and unable to get out of bed. Patient is able to speak but her speech is extremely delayed. Patient is on aspirin 325 mg a day. No recent history of falls. In the ER the CT of the head does not show any acute abnormality. The patient does have a mild urinary tract infection. Chest x-ray negative. Low-grade fever of 99.2, hypotensive with systolic in the 299B. Creatinine 1.2.  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,  2-D echo shows EF of 50-55%, carotid Doppler within normal limits, continue aspirin  PT OT evaluation recommended SNF , speech therapy evaluation Lipid panel-LDL 69 Unclear as to why patient has declined over the last 1 week Ammonia, vitamin B-12, ESR, TSH all within normal limits except ESR of 72 Palliative care consultation for goals of care EEG shows slowing but no epileptiform activity, neurology consult for cognitive decline in last 6 months ,especially in last one week ,   Consulted IR for 25m right carotid aneurysm per neuro, they recommended holding off on inpatient angiogram as the patient will  now be followed by palliative care and SNF Patient started on amantadine by neurology ,Dr Silverio Decamp   Hyperlipidemia LDL goal <100-LDL 69, continue statin   Essential hypertension/hypertensive , resume antihypertensive medications and increase metoprolol   DM2 (diabetes mellitus, type 2) (HCC)-hemoglobin A1c 6.2  Hypokalemia-replete, Magnesium 1.4  Elevated ESR-patient not complaining of headache or joint pain, CK normal, doubt polymyalgia rheumatica,   Risk for falls-fall precautions  Discharge Exam:   Blood pressure 145/63, pulse 80, temperature 98.7 F (37.1 C), temperature source Oral, resp. rate 18, weight 84.7 kg (186 lb 11.7 oz), SpO2 96 %.   General exam: Appears calm and comfortable  Respiratory system: Clear to auscultation. Respiratory effort normal. Cardiovascular system: S1 & S2 heard, RRR. No JVD, murmurs, rubs, gallops or clicks. No pedal edema. Gastrointestinal system: Abdomen is nondistended, soft and nontender. No organomegaly or masses felt. Normal bowel sounds heard. Central nervous system: awake . No focal neurological deficits   Follow-up Information    Follow up with [facility].    Specialty:  [place]   Contact information:   [address] [place] [phone]      Follow up with [name], MD. Schedule an appointment as soon as possible for a visit in 3 days.   Specialties:  Family Medicine, Radiology   Why:  hospital follow up   Contact information:   [address] [place] [address] [phone]       Signed: Reyne Dumas 08/29/2015, 11:49 AM        Time spent >45 mins

## 2015-08-29 NOTE — Progress Notes (Signed)
Pt cleared for discharge by MD Abrol.

## 2015-09-01 ENCOUNTER — Encounter: Payer: Self-pay | Admitting: Internal Medicine

## 2015-09-01 ENCOUNTER — Ambulatory Visit: Payer: Medicare Other | Admitting: Family Medicine

## 2015-09-01 ENCOUNTER — Non-Acute Institutional Stay (SKILLED_NURSING_FACILITY): Payer: Medicare Other | Admitting: Internal Medicine

## 2015-09-01 DIAGNOSIS — F0280 Dementia in other diseases classified elsewhere without behavioral disturbance: Secondary | ICD-10-CM | POA: Diagnosis not present

## 2015-09-01 DIAGNOSIS — E1100 Type 2 diabetes mellitus with hyperosmolarity without nonketotic hyperglycemic-hyperosmolar coma (NKHHC): Secondary | ICD-10-CM

## 2015-09-01 DIAGNOSIS — G20 Parkinson's disease: Secondary | ICD-10-CM

## 2015-09-01 DIAGNOSIS — Z0289 Encounter for other administrative examinations: Secondary | ICD-10-CM

## 2015-09-01 DIAGNOSIS — I720 Aneurysm of carotid artery: Secondary | ICD-10-CM | POA: Diagnosis not present

## 2015-09-01 DIAGNOSIS — I10 Essential (primary) hypertension: Secondary | ICD-10-CM | POA: Diagnosis not present

## 2015-09-01 DIAGNOSIS — R5381 Other malaise: Secondary | ICD-10-CM | POA: Diagnosis not present

## 2015-09-01 DIAGNOSIS — K219 Gastro-esophageal reflux disease without esophagitis: Secondary | ICD-10-CM | POA: Diagnosis not present

## 2015-09-01 DIAGNOSIS — E785 Hyperlipidemia, unspecified: Secondary | ICD-10-CM | POA: Diagnosis not present

## 2015-09-01 DIAGNOSIS — G9340 Encephalopathy, unspecified: Secondary | ICD-10-CM | POA: Diagnosis not present

## 2015-09-01 DIAGNOSIS — N390 Urinary tract infection, site not specified: Secondary | ICD-10-CM | POA: Diagnosis not present

## 2015-09-01 NOTE — Progress Notes (Signed)
LOCATION: [name]  PCP: [name], MD   Code Status: Full Code  Goals of care: Advanced Directive information Advanced Directives 01/03/2014  Does patient have an advance directive? No;Yes  Type of Advance Directive Living will  Copy of advanced directive(s) in chart? No - copy requested  Would patient like information on creating an advanced directive? No - patient declined information  Pre-existing out of facility DNR order (yellow form or pink MOST form) -       Extended Emergency Contact Information Primary Emergency Contact: Slade,[name] Address: [place] [address], [place] Phone: [phone] Work Phone: [phone] Mobile Phone: [phone] Relation: Relative Secondary Emergency Contact: Radcliffe,Annette Address: [address]          [place], [place] [address] [place] Mobile Phone: [phone] Relation: Niece   Allergies  Allergen Reactions  . Fluoxetine Hcl Other (See Comments)     vomiting  . Hydrocodone     Nausea and vomiting   . Shellfish Allergy Nausea And Vomiting    Just feels "sick'  . Diclofenac Sodium Other (See Comments)    : vomiting    Chief Complaint  Patient presents with  . New Admit To SNF    New Admission     HPI:  Patient is a 82 y.o. female seen today for short term rehabilitation post hospital admission from 08/25/15-08/29/15 with acute encephalopathy in setting of UTI and possible parkinson's dementia. She was started on antibiotics and amantadine after neurology consult. She is seen in her room today. She needs assistance with her ADLs. Per niece who is her POA, patient was independent prior to this admission. CVA was ruled out in the hospital. She was noted to have a 6 mm right carotid terminus aneurysm and IR was consulted. Angiogram was deferred and palliative care was consulted.   Review of Systems:  Constitutional: Negative for fever, chills,  diaphoresis. Feels weak and tired.  HENT: Negative for headache, congestion, nasal discharge Eyes: Negative for blurred vision, double vision and discharge. Wears glasses. Respiratory: Negative for cough, shortness of breath and wheezing.   Cardiovascular: Negative for chest pain, palpitations, leg swelling.  Gastrointestinal: Negative for heartburn, nausea, vomiting, abdominal pain. Positive for poor appetite. Last bowel movement was 2-3 days ago.  Genitourinary: Negative for dysuria Musculoskeletal: Negative for back pain, fall in the facility.  Skin: Negative for itching, rash.  Neurological: Negative for dizziness. Psychiatric/Behavioral: Negative for depression   Past Medical History  Diagnosis Date  . Diabetes mellitus   . Hyperlipidemia   . Osteoarthritis   . Osteoporosis   . Hypertension   . Obesity   . Upper GI bleed     AV malformation/when anticoag  . Sacroiliac joint dysfunction 04/01/2012   Past Surgical History  Procedure Laterality Date  . Appendectomy    . Cholecystectomy    . Colon resection  1988    secondary to cancer  . Polypectomy  1998    benign X 2  . Cervicitis  1964    conization   . Total knee arthroplasty  07/2009    right   . Endometrial polyp  05/2000    hyperplasia-laser treatment  . Joint replacement      Rt knee replacement   Social History:   reports that she quit smoking about 53 years ago. Her smoking use included Cigarettes. She has never used smokeless tobacco. She reports  that she does not drink alcohol or use illicit drugs.  Family History  Problem Relation Age of Onset  . Stroke Father   . Cancer Brother     lung    Medications:   Medication List       This list is accurate as of: 09/01/15  3:38 PM.  Always use your most recent med list.               acetaminophen 500 MG tablet  Commonly known as:  TYLENOL  Take 1,000 mg by mouth every 4 (four) hours as needed.     amantadine 100 MG capsule  Commonly known as:   SYMMETREL  Take 1 capsule (100 mg total) by mouth every morning.     aspirin 325 MG tablet  Take 325 mg by mouth daily.     atorvastatin 10 MG tablet  Commonly known as:  LIPITOR  TAKE 1 TABLET BY MOUTH DAILY     calcium carbonate 600 MG Tabs tablet  Commonly known as:  OS-CAL  Take 600 mg by mouth 2 (two) times daily with a meal.     cephALEXin 500 MG capsule  Commonly known as:  KEFLEX  Take 1 capsule (500 mg total) by mouth 4 (four) times daily.     metoprolol 50 MG tablet  Commonly known as:  LOPRESSOR  Take 1 tablet (50 mg total) by mouth 2 (two) times daily.     multivitamin with minerals tablet  Take 1 tablet by mouth daily.     pantoprazole 40 MG tablet  Commonly known as:  PROTONIX  Take 1 tablet (40 mg total) by mouth daily.     vitamin C 500 MG tablet  Commonly known as:  ASCORBIC ACID  Take 500 mg by mouth daily.        Immunizations: Immunization History  Administered Date(s) Administered  . Influenza Split 04/02/2011, 01/07/2012  . Influenza Whole 05/20/2009, 03/23/2010  . Influenza,inj,Quad PF,36+ Mos 12/08/2012, 01/23/2014, 01/27/2015  . PPD Test 08/29/2015  . Pneumococcal Conjugate-13 07/26/2014  . Pneumococcal Polysaccharide-23 12/11/1996, 03/28/2007  . Td 12/11/1996, 03/28/2007     Physical Exam: Filed Vitals:   09/01/15 1532  BP: 121/71  Pulse: 80  Temp: 98.2 F (36.8 C)  TempSrc: Oral  Resp: 16  Height: 5' 7.5" (1.715 m)  Weight: 186 lb 11.2 oz (84.687 kg)  SpO2: 98%   Body mass index is 28.79 kg/(m^2).  General- elderly female, well built, in no acute distress Head- normocephalic, atraumatic Nose- no maxillary or frontal sinus tenderness, no nasal discharge Throat- moist mucus membrane, wears dentures Eyes- PERRLA, EOMI, no pallor, no icterus, no discharge, normal conjunctiva, normal sclera Neck- no cervical lymphadenopathy Cardiovascular- normal s1,s2, no murmur, no leg edema Respiratory- bilateral clear to auscultation, no  wheeze, no rhonchi, no crackles, no use of accessory muscles Abdomen- bowel sounds present, soft, non tender Musculoskeletal- able to move all 4 extremities, generalized weakness Neurological- alert and oriented to person, place and time Skin- warm and dry Psychiatry- normal mood and affect    Labs reviewed: Basic Metabolic Panel:  Recent Labs  08/25/15 1718 08/26/15 0211 08/28/15 0525 08/28/15 1001 08/29/15 08/29/15 0547  NA  --  140 139  --  139 139  K  --  3.5 3.3*  --   --  3.8  CL  --  105 103  --   --  102  CO2  --  21* 22  --   --  26  GLUCOSE  --  97 110*  --   --  91  BUN  --  10 <5*  --  6 6  CREATININE  --  1.08* 0.88  --  1.0 0.99  CALCIUM  --  9.4 9.2  --   --  9.6  MG 1.3*  --   --  1.4*  --   --    Liver Function Tests:  Recent Labs  08/25/15 1718 08/26/15 0211 08/28/15 0525  AST 28 28 27   ALT 14 14 14   ALKPHOS 57 56 60  BILITOT 1.1 0.9 0.8  PROT 8.0 7.5 8.0  ALBUMIN 3.8 3.6 3.5   No results for input(s): LIPASE, AMYLASE in the last 8760 hours.  Recent Labs  08/27/15 1334  AMMONIA 24   CBC:  Recent Labs  07/23/15 0810 08/25/15 1220 08/26/15 0211 08/28/15 08/28/15 0525  WBC 7.2 7.3 7.3 6.2 6.2  NEUTROABS 3.9 4.5  --   --   --   HGB 11.2* 13.8 12.5  --  13.3  HCT 33.7* 41.6 39.1  --  40.2  MCV 86.7 88.7 88.7  --  86.6  PLT 179.0 166 157  --  182   Cardiac Enzymes:  Recent Labs  08/25/15 2031 08/26/15 0211 08/26/15 1007 08/27/15 1127  CKTOTAL  --   --   --  118  TROPONINI 0.04* 0.04* 0.05*  --    BNP: Invalid input(s): POCBNP CBG:  Recent Labs  08/28/15 2134 08/29/15 0742 08/29/15 1136  GLUCAP 102* 100* 134*    Radiological Exams: Dg Chest 1 View  08/25/2015  CLINICAL DATA:  Altered mental status for 2 days EXAM: CHEST 1 VIEW COMPARISON:  April 16, 2012 FINDINGS: There is slight scarring in the right apex. Lungs elsewhere are clear. Heart size and pulmonary vascularity are normal. No adenopathy. No pneumothorax. No  bone lesions. IMPRESSION: Mild scarring right apex.  No edema or consolidation. Electronically Signed   By: Lowella Grip III M.D.   On: 08/25/2015 13:04   Dg Pelvis 1-2 Views  08/25/2015  CLINICAL DATA:  Pain.  Altered mental status EXAM: PELVIS - 1-2 VIEW COMPARISON:  None. FINDINGS: There is no evidence of pelvic fracture or dislocation. There is mild symmetric narrowing of both hip joints. There is degenerative change in the visualized lumbar spine. No erosive change. IMPRESSION: Areas of osteoarthritic change.  No fracture or dislocation. Electronically Signed   By: Lowella Grip III M.D.   On: 08/25/2015 13:05   Dg Abd 1 View  08/25/2015  CLINICAL DATA:  Abdominal pain and altered mental status EXAM: ABDOMEN - 1 VIEW COMPARISON:  CT abdomen and pelvis January 03, 2014 FINDINGS: There is fairly diffuse stool throughout the colon. There is no bowel dilatation or air-fluid level suggesting obstruction. No free air is evident on this supine examination. There is degenerative change in the lumbar spine. IMPRESSION: Fairly diffuse stool throughout colon. Bowel gas pattern unremarkable. No demonstrable obstruction or free air. Electronically Signed   By: Lowella Grip III M.D.   On: 08/25/2015 13:05   Ct Head Wo Contrast  08/25/2015  CLINICAL DATA:  Altered mental status for 3 days, initial encounter EXAM: CT HEAD WITHOUT CONTRAST TECHNIQUE: Contiguous axial images were obtained from the base of the skull through the vertex without intravenous contrast. COMPARISON:  04/03/2012 FINDINGS: Bony calvarium is intact. No gross soft tissue abnormality is noted. Mild atrophic and chronic white matter ischemic changes are seen. No findings to suggest acute  hemorrhage, acute infarction or space-occupying mass lesion are noted. IMPRESSION: Chronic atrophic and ischemic changes.  No acute abnormality noted. Electronically Signed   By: Inez Catalina M.D.   On: 08/25/2015 14:45   Mr Brain Wo  Contrast  08/26/2015  CLINICAL DATA:  Lethargy, mental slowness, last seen normal 1 week ago. Delayed speech. History of diabetes, hypertension, hyperlipidemia. EXAM: MRI HEAD WITHOUT CONTRAST TECHNIQUE: Multiplanar, multiecho pulse sequences of the brain and surrounding structures were obtained without intravenous contrast. Coronal diffusion weighted imaging, axial of T1 and coronal T2 sequences not obtained due to patient's inability to remain still. COMPARISON:  CT HEAD Aug 25, 2015 at 1427 hours FINDINGS: Sequences are moderately or severely motion degraded. INTRACRANIAL CONTENTS: No reduced diffusion to suggest acute ischemia. No large area susceptibility artifact though, axial SWAN sequence is severely motion degraded. The ventricles and sulci are normal for patient's age. No abnormal parenchymal signal, mass lesions, mass effect. Patchy to confluent supratentorial and pontine white matter FLAIR T2 hyperintensities. No abnormal extra-axial fluid collections. No extra-axial masses though, contrast enhanced sequences would be more sensitive. Normal major intracranial vascular flow voids present at skull base. Asymmetric fullness RIGHT carotid terminus inferiorly directed measuring up to 6 mm. ORBITS: The included ocular globes and orbital contents are non-suspicious. SINUSES: The mastoid air-cells and included paranasal sinuses are well-aerated. SKULL/SOFT TISSUES: No abnormal sellar expansion. No suspicious calvarial bone marrow signal. Craniocervical junction maintained. IMPRESSION: Limited motion degraded abbreviated MRI of the brain. No acute ischemia. Moderate to severe white matter changes compatible chronic small vessel ischemic disease. Possible 6 mm RIGHT carotid terminus aneurysm would be better characterized on MRA versus CTA HEAD on a nonemergent basis. Electronically Signed   By: Elon Alas M.D.   On: 08/26/2015 01:55    Assessment/Plan  Physical deconditioning Will have her work with  physical therapy and occupational therapy team to help with gait training and muscle strengthening exercises.fall precautions. Skin care. Encourage to be out of bed. Assistance with meals for now. Was seen by palliative care in the hospital. Get palliative care consult.   Acute encephalopathy Seen by neurology. Thought to be from UTI and neurocognitive decline. To continue and complete her keflex course. Continue amantadine and get neurology consult. Will get SLP consult as well  UTI Continue keflex 500 mg q6h until 09/02/15. Hydration to be encouraged.   Parkinson's dementia Continue amantadine 100 mg daily and monitor clinically, provide supportive care. With her active decline per family, get palliative care consult for further review on goals of care  Carotid terminus aneurysm Of 6 mm. Will need MRA neck and vascular consult for further evaluation. Will hold off on this for now with her deconditioning. Will have her see neurology and palliative care at present and monitor. If patient makes clinical improvement, will get vascular consult  HLD Continue atorvastatin  HTN Monitor bp, continue lopressor 50 mg bid  gerd Stable, continue protonix  DM Lab Results  Component Value Date   HGBA1C 6.2* 08/25/2015   Diet controlled for now, monitor   Goals of care: short term rehabilitation   Labs/tests ordered: cbc, cmp 09/02/15  Family/ staff Communication: reviewed care plan with patient, her niece over the phone and nursing supervisor.    Blanchie Serve, MD Internal Medicine [facility] [address] [place], [place] [address] Cell Phone (Monday-Friday 8 am - 5 pm): [phone] On Call: [phone] and follow prompts after 5 pm and on weekends Office Phone: [phone]  Office Fax: [phone]

## 2015-09-03 LAB — CBC AND DIFFERENTIAL
HEMATOCRIT: 44 % (ref 36–46)
HEMOGLOBIN: 14.2 g/dL (ref 12.0–16.0)
PLATELETS: 212 10*3/uL (ref 150–399)
WBC: 8.9 10*3/mL

## 2015-09-03 LAB — BASIC METABOLIC PANEL
BUN: 17 mg/dL (ref 4–21)
Creatinine: 1 mg/dL (ref 0.5–1.1)
GLUCOSE: 80 mg/dL
POTASSIUM: 3.9 mmol/L (ref 3.4–5.3)
Sodium: 138 mmol/L (ref 137–147)

## 2015-09-03 LAB — HEPATIC FUNCTION PANEL
ALT: 13 U/L (ref 7–35)
AST: 33 U/L (ref 13–35)
Alkaline Phosphatase: 78 U/L (ref 25–125)
BILIRUBIN, TOTAL: 0.7 mg/dL

## 2015-09-04 DIAGNOSIS — R110 Nausea: Secondary | ICD-10-CM | POA: Diagnosis not present

## 2015-09-10 ENCOUNTER — Telehealth: Payer: Self-pay

## 2015-09-10 NOTE — Telephone Encounter (Signed)
Kelly left v/m (no DPR ) that [employer] needed [employer]. Unable to reach Louisa and I spoke with Margaretha Sheffield at [employer] at [facility].and Margaretha Sheffield requested copy of immunization list faxed to [phone]. Done.

## 2015-09-15 DIAGNOSIS — G9340 Encephalopathy, unspecified: Secondary | ICD-10-CM | POA: Insufficient documentation

## 2015-09-15 DIAGNOSIS — G20 Parkinson's disease: Secondary | ICD-10-CM | POA: Insufficient documentation

## 2015-09-16 DIAGNOSIS — R627 Adult failure to thrive: Secondary | ICD-10-CM | POA: Diagnosis not present

## 2015-09-18 ENCOUNTER — Non-Acute Institutional Stay (SKILLED_NURSING_FACILITY): Payer: Medicare Other | Admitting: Family

## 2015-09-18 ENCOUNTER — Encounter: Payer: Self-pay | Admitting: Family

## 2015-09-18 DIAGNOSIS — B373 Candidiasis of vulva and vagina: Secondary | ICD-10-CM | POA: Diagnosis not present

## 2015-09-18 DIAGNOSIS — B3731 Acute candidiasis of vulva and vagina: Secondary | ICD-10-CM

## 2015-09-18 DIAGNOSIS — K5901 Slow transit constipation: Secondary | ICD-10-CM | POA: Diagnosis not present

## 2015-09-18 MED ORDER — BISACODYL 10 MG RE SUPP: 10.0000 mg | Freq: Once | RECTAL | Status: DC

## 2015-09-18 MED ORDER — FLUCONAZOLE 150 MG PO TABS: 150.0000 mg | ORAL_TABLET | Freq: Once | ORAL | Status: DC

## 2015-09-18 MED ORDER — SENNOSIDES-DOCUSATE SODIUM 8.6-50 MG PO TABS
1.0000 | ORAL_TABLET | Freq: Two times a day (BID) | ORAL | Status: DC
Start: 2015-09-18 — End: 2015-09-29

## 2015-09-18 NOTE — Progress Notes (Signed)
Location:  [place] Room Number: 1204 Place of Service:  SNF [phone]) Provider:  Marlowe Sax, NP  [name], MD  Patient Care Team: Abner Greenspan, MD as PCP - General  Extended Emergency Contact Information Primary Emergency Contact: [facility] Address: [address]          [place], [place] [address] [place]te [place] Phone: [phone] Work Phone: [phone] Mobile Phone: [phone] Relation: Niece Secondary Emergency Contact: Radcliffe,Annette Address: [address]          [place], [place] [address] [place] Mobile Phone: [phone] Relation: Niece  Code Status:  DNR Goals of care: Advanced Directive information Advanced Directives 09/18/2015  Does patient have an advance directive? Yes  Type of Advance Directive Out of facility DNR (pink MOST or yellow form)  Does patient want to make changes to advanced directive? No - Patient declined  Copy of advanced directive(s) in chart? Yes  Would patient like information on creating an advanced directive? -     Chief Complaint  Patient presents with  . Acute Visit    Acute Concerns    HPI:  Pt is a 82 y.o. female seen today at [facility] for an acute visit for vaginal itching.she is seen in her room today. She complains of vaginal itching. Denies any fever or discharge. She continues to have diminished appetite. Facility staff states not eating her meals but encouraged to drink. She states doesn't like protein supplements. CBG's 110's-140's. She continues to follow up with Palliative care services.    Past Medical History  Diagnosis Date  . Diabetes mellitus   . Hyperlipidemia   . Osteoarthritis   . Osteoporosis   . Hypertension   . Obesity   . Upper GI bleed     AV malformation/when anticoag  . Sacroiliac joint dysfunction 04/01/2012   Past Surgical History  Procedure Laterality Date  . Appendectomy    . Cholecystectomy      . Colon resection  1988    secondary to cancer  . Polypectomy  1998    benign X 2  . Cervicitis  1964    conization   . Total knee arthroplasty  07/2009    right   . Endometrial polyp  05/2000    hyperplasia-laser treatment  . Joint replacement      Rt knee replacement    Allergies  Allergen Reactions  . Fluoxetine Hcl Other (See Comments)     vomiting  . Hydrocodone     Nausea and vomiting   . Shellfish Allergy Nausea And Vomiting    Just feels "sick'  . Diclofenac Sodium Other (See Comments)    : vomiting      Medication List       This list is accurate as of: 09/18/15  9:24 AM.  Always use your most recent med list.               acetaminophen 500 MG tablet  Commonly known as:  TYLENOL  Take 1,000 mg by mouth every 4 (four) hours as needed.     amantadine 100 MG capsule  Commonly known as:  SYMMETREL  Take 1 capsule (100 mg total) by mouth every morning.     aspirin 325 MG tablet  Take 325 mg by mouth daily.     atorvastatin 10 MG tablet  Commonly known as:  LIPITOR  TAKE 1 TABLET BY MOUTH DAILY  calcium carbonate 600 MG Tabs tablet  Commonly known as:  OS-CAL  Take 600 mg by mouth daily with breakfast.     metoprolol 50 MG tablet  Commonly known as:  LOPRESSOR  Take 1 tablet (50 mg total) by mouth 2 (two) times daily.     multivitamin with minerals tablet  Take 1 tablet by mouth daily.     NUTRITIONAL SUPPLEMENT PO  Take Med Pass 120 ml by mouth twice a day due to poor appetite or oral intake.     pantoprazole 40 MG tablet  Commonly known as:  PROTONIX  Take 1 tablet (40 mg total) by mouth daily.     vitamin C 500 MG tablet  Commonly known as:  ASCORBIC ACID  Take 500 mg by mouth daily.        Review of Systems  Constitutional: Positive for appetite change. Negative for fever, chills, activity change and fatigue.  HENT: Negative for congestion, rhinorrhea, sinus pressure, sneezing and sore throat.   Eyes: Negative.   Respiratory:  Negative for cough, chest tightness, shortness of breath and wheezing.   Cardiovascular: Negative for chest pain, palpitations and leg swelling.  Gastrointestinal: Positive for constipation. Negative for nausea, vomiting, abdominal pain, diarrhea and abdominal distention.  Genitourinary: Negative for dysuria, urgency, frequency, flank pain, vaginal discharge, vaginal pain and pelvic pain.       Vaginal itching   Musculoskeletal: Positive for gait problem.  Skin: Negative.   Neurological: Negative for dizziness, seizures, light-headedness and headaches.  Psychiatric/Behavioral: Negative for hallucinations, confusion, sleep disturbance and agitation. The patient is not nervous/anxious.     Immunization History  Administered Date(s) Administered  . Influenza Split 04/02/2011, 01/07/2012  . Influenza Whole 05/20/2009, 03/23/2010  . Influenza,inj,Quad PF,36+ Mos 12/08/2012, 01/23/2014, 01/27/2015  . PPD Test 08/29/2015  . Pneumococcal Conjugate-13 07/26/2014  . Pneumococcal Polysaccharide-23 12/11/1996, 03/28/2007  . Td 12/11/1996, 03/28/2007   Pertinent  Health Maintenance Due  Topic Date Due  . URINE MICROALBUMIN  01/24/2015  . MAMMOGRAM  05/14/2020 (Originally 11/10/2013)  . INFLUENZA VACCINE  11/11/2015  . OPHTHALMOLOGY EXAM  01/21/2016  . HEMOGLOBIN A1C  02/25/2016  . FOOT EXAM  07/29/2016  . DEXA SCAN  Completed  . PNA vac Low Risk Adult  Completed   Fall Risk  07/30/2015 07/26/2014 07/23/2013 07/05/2012  Falls in the past year? Yes Yes No Yes  Number falls in past yr: 1 2 or more - 1  Injury with Fall? No No - -  Risk for fall due to : - - - Impaired balance/gait;Impaired mobility;Medication side effect   Functional Status Survey:    Filed Vitals:   09/18/15 0852  BP: 113/59  Pulse: 78  Temp: 98.5 F (36.9 C)  Resp: 20  Height: 5' 7.5" (1.715 m)  Weight: 193 lb 3.2 oz (87.635 kg)  SpO2: 94%   Body mass index is 29.8 kg/(m^2). Physical Exam  Labs reviewed:  Recent  Labs  08/25/15 1718 08/26/15 0211 08/28/15 0525 08/28/15 1001 08/29/15 08/29/15 0547 09/03/15  NA  --  140 139  --  139 139 138  K  --  3.5 3.3*  --   --  3.8 3.9  CL  --  105 103  --   --  102  --   CO2  --  21* 22  --   --  26  --   GLUCOSE  --  97 110*  --   --  91  --   BUN  --  10 <5*  --  6 6 17   CREATININE  --  1.08* 0.88  --  1.0 0.99 1.0  CALCIUM  --  9.4 9.2  --   --  9.6  --   MG 1.3*  --   --  1.4*  --   --   --     Recent Labs  08/25/15 1718 08/26/15 0211 08/28/15 0525 09/03/15  AST 28 28 27  33  ALT 14 14 14 13   ALKPHOS 57 56 60 78  BILITOT 1.1 0.9 0.8  --   PROT 8.0 7.5 8.0  --   ALBUMIN 3.8 3.6 3.5  --     Recent Labs  07/23/15 0810 08/25/15 1220 08/26/15 0211 08/28/15 08/28/15 0525 09/03/15  WBC 7.2 7.3 7.3 6.2 6.2 8.9  NEUTROABS 3.9 4.5  --   --   --   --   HGB 11.2* 13.8 12.5  --  13.3 14.2  HCT 33.7* 41.6 39.1  --  40.2 44  MCV 86.7 88.7 88.7  --  86.6  --   PLT 179.0 166 157  --  182 212   Lab Results  Component Value Date   TSH 1.216 08/25/2015   Lab Results  Component Value Date   HGBA1C 6.2* 08/25/2015   Lab Results  Component Value Date   CHOL 128 08/26/2015   HDL 42 08/26/2015   LDLCALC 69 08/26/2015   LDLDIRECT 39.0 07/23/2015   TRIG 86 08/26/2015   CHOLHDL 3.0 08/26/2015    Significant Diagnostic Results in last 30 days:  Dg Chest 1 View  08/25/2015  CLINICAL DATA:  Altered mental status for 2 days EXAM: CHEST 1 VIEW COMPARISON:  April 16, 2012 FINDINGS: There is slight scarring in the right apex. Lungs elsewhere are clear. Heart size and pulmonary vascularity are normal. No adenopathy. No pneumothorax. No bone lesions. IMPRESSION: Mild scarring right apex.  No edema or consolidation. Electronically Signed   By: Lowella Grip III M.D.   On: 08/25/2015 13:04   Dg Pelvis 1-2 Views  08/25/2015  CLINICAL DATA:  Pain.  Altered mental status EXAM: PELVIS - 1-2 VIEW COMPARISON:  None. FINDINGS: There is no evidence of pelvic  fracture or dislocation. There is mild symmetric narrowing of both hip joints. There is degenerative change in the visualized lumbar spine. No erosive change. IMPRESSION: Areas of osteoarthritic change.  No fracture or dislocation. Electronically Signed   By: Lowella Grip III M.D.   On: 08/25/2015 13:05   Dg Abd 1 View  08/25/2015  CLINICAL DATA:  Abdominal pain and altered mental status EXAM: ABDOMEN - 1 VIEW COMPARISON:  CT abdomen and pelvis January 03, 2014 FINDINGS: There is fairly diffuse stool throughout the colon. There is no bowel dilatation or air-fluid level suggesting obstruction. No free air is evident on this supine examination. There is degenerative change in the lumbar spine. IMPRESSION: Fairly diffuse stool throughout colon. Bowel gas pattern unremarkable. No demonstrable obstruction or free air. Electronically Signed   By: Lowella Grip III M.D.   On: 08/25/2015 13:05   Ct Head Wo Contrast  08/25/2015  CLINICAL DATA:  Altered mental status for 3 days, initial encounter EXAM: CT HEAD WITHOUT CONTRAST TECHNIQUE: Contiguous axial images were obtained from the base of the skull through the vertex without intravenous contrast. COMPARISON:  04/03/2012 FINDINGS: Bony calvarium is intact. No gross soft tissue abnormality is noted. Mild atrophic and chronic white matter ischemic changes are seen. No findings to suggest acute  hemorrhage, acute infarction or space-occupying mass lesion are noted. IMPRESSION: Chronic atrophic and ischemic changes.  No acute abnormality noted. Electronically Signed   By: Inez Catalina M.D.   On: 08/25/2015 14:45   Mr Brain Wo Contrast  08/26/2015  CLINICAL DATA:  Lethargy, mental slowness, last seen normal 1 week ago. Delayed speech. History of diabetes, hypertension, hyperlipidemia. EXAM: MRI HEAD WITHOUT CONTRAST TECHNIQUE: Multiplanar, multiecho pulse sequences of the brain and surrounding structures were obtained without intravenous contrast. Coronal  diffusion weighted imaging, axial of T1 and coronal T2 sequences not obtained due to patient's inability to remain still. COMPARISON:  CT HEAD Aug 25, 2015 at 1427 hours FINDINGS: Sequences are moderately or severely motion degraded. INTRACRANIAL CONTENTS: No reduced diffusion to suggest acute ischemia. No large area susceptibility artifact though, axial SWAN sequence is severely motion degraded. The ventricles and sulci are normal for patient's age. No abnormal parenchymal signal, mass lesions, mass effect. Patchy to confluent supratentorial and pontine white matter FLAIR T2 hyperintensities. No abnormal extra-axial fluid collections. No extra-axial masses though, contrast enhanced sequences would be more sensitive. Normal major intracranial vascular flow voids present at skull base. Asymmetric fullness RIGHT carotid terminus inferiorly directed measuring up to 6 mm. ORBITS: The included ocular globes and orbital contents are non-suspicious. SINUSES: The mastoid air-cells and included paranasal sinuses are well-aerated. SKULL/SOFT TISSUES: No abnormal sellar expansion. No suspicious calvarial bone marrow signal. Craniocervical junction maintained. IMPRESSION: Limited motion degraded abbreviated MRI of the brain. No acute ischemia. Moderate to severe white matter changes compatible chronic small vessel ischemic disease. Possible 6 mm RIGHT carotid terminus aneurysm would be better characterized on MRA versus CTA HEAD on a nonemergent basis. Electronically Signed   By: Elon Alas M.D.   On: 08/26/2015 01:55    Assessment/Plan Constipation  Has had no BM for several days though has decreased appetite.  Continue Senna-Doc 8.6-50 mg Tablet twice daily. Add Dulcolax 10 mg Supp per rectal x 1 dose now may repeat dose if no BM results.    Candidiasis Vagina  Diflucan 150 mg tablet x 1 dose. Continue to monitor.   Family/ staff Communication: Reviewed plan with patient and facility Nurse supervisor.    Labs/tests ordered:  None

## 2015-09-23 ENCOUNTER — Ambulatory Visit (INDEPENDENT_AMBULATORY_CARE_PROVIDER_SITE_OTHER): Payer: Medicare Other | Admitting: Neurology

## 2015-09-23 ENCOUNTER — Encounter: Payer: Self-pay | Admitting: Neurology

## 2015-09-23 VITALS — BP 105/63 | HR 64

## 2015-09-23 DIAGNOSIS — M542 Cervicalgia: Secondary | ICD-10-CM | POA: Diagnosis not present

## 2015-09-23 DIAGNOSIS — I729 Aneurysm of unspecified site: Secondary | ICD-10-CM

## 2015-09-23 DIAGNOSIS — I639 Cerebral infarction, unspecified: Secondary | ICD-10-CM

## 2015-09-23 DIAGNOSIS — R269 Unspecified abnormalities of gait and mobility: Secondary | ICD-10-CM | POA: Diagnosis not present

## 2015-09-23 DIAGNOSIS — G9340 Encephalopathy, unspecified: Secondary | ICD-10-CM

## 2015-09-23 DIAGNOSIS — I6300 Cerebral infarction due to thrombosis of unspecified precerebral artery: Secondary | ICD-10-CM

## 2015-09-23 DIAGNOSIS — F0390 Unspecified dementia without behavioral disturbance: Secondary | ICD-10-CM | POA: Diagnosis not present

## 2015-09-23 NOTE — Progress Notes (Signed)
PATIENT: Erin [name] DOB: [birth year]  Chief Complaint  Patient presents with  . Encephalopathy    MMSE 19/30 - 6 animals.  She is her with her friend, Erin [name] and her neice, Erin [name] (who both serve as her POA).  The patient normally lives at home with her mentally challenged adult son.  She had a sudden change in mental status that resulted in hospitalization.  She is now in rehab at [facility].  Her family would like to review her test results and further discuss her diagnosis.     HISTORICAL  Erin [name] is a 82 years old right-handed female, she is accompanied by her friend Erin [name] and her niece Erin [name] at today's clinical visit, this is to follow-up her hospital admission in Aug 25 2015, she currently resides at [facility] and rehabilitation center. Her primary care physician is Dr.Marne A Tower.  I reviewed and summarized her hospital admission in May 2017, she had a past medical history of diabetes, hypertension, hyperlipidemia, history of colon cancer in 1988, had colectomy, but did not require chemotherapy radiation therapy.  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[employer], eventually retired at age 19, she was noted to have mild memory trouble since 2012, she lives with her only child who is at age 68, with mental retardation, at baseline, she is still independent living, she has difficulty with names, had quit driving in 7035 after right knee replacement, she had gradually worsening increased gait difficulty, also had left thoracic shingles in 2015, was treated with gabapentin for extended period of time, she was still able to go out with her family for grocery shopping, Erin [name] for herself and her son, dress take a bath for  herself, but she has become much less active, she spent most of her time which TV, tends to have irregular sleep patterns. With her worsening memory trouble, her niece Erin [name] has to go over her on light bill payment around 2016.  in Aug 25 2015, she was noted by her niece, who is also her power of attorney, that she has increased confusion, urinary incontinence episodes, increased unsteady gait, She was diagnosed with urinary tract infection, was treated with IV Rocephin, was discharged to current rehabilitation, but she is now back to her baseline yet, she has intermittent increased confusion, continue have urinary urgency, occasionally incontinence, significant gait abnormality. She also complains of mild constipations, chronic low back pain. 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 Fluoxetine Hcl Other (See Comments)     vomiting  . Hydrocodone     Nausea and vomiting   . Shellfish Allergy Nausea  And Vomiting    Just feels "sick'  . Diclofenac Sodium Other (See Comments)    : vomiting    HOME MEDICATIONS: Current Outpatient Prescriptions  Medication Sig Dispense Refill  . acetaminophen (TYLENOL) 500 MG tablet Take 1,000 mg by mouth every 4 (four) hours as needed.    Marland Kitchen amantadine (SYMMETREL) 100 MG capsule Take 1 capsule (100 mg total) by mouth every morning. 30 capsule 1  . aspirin 325  MG tablet Take 325 mg by mouth daily.     Marland Kitchen atorvastatin (LIPITOR) 10 MG tablet TAKE 1 TABLET BY MOUTH DAILY 30 tablet 11  . calcium carbonate (OS-CAL) 600 MG TABS tablet Take 600 mg by mouth daily with breakfast.     . metoprolol (LOPRESSOR) 50 MG tablet Take 1 tablet (50 mg total) by mouth 2 (two) times daily. 60 tablet 1  . Multiple Vitamins-Minerals (MULTIVITAMIN WITH MINERALS) tablet Take 1 tablet by mouth daily.    . Nutritional Supplements (NUTRITIONAL SUPPLEMENT PO) Take Med Pass 120 ml by mouth twice a day due to poor appetite or oral intake.    . pantoprazole (PROTONIX) 40 MG tablet Take 1 tablet (40 mg total) by mouth daily. 30 tablet 1  . senna-docusate (SENOKOT-S) 8.6-50 MG tablet Take 1 tablet by mouth 2 (two) times daily.    . vitamin C (ASCORBIC ACID) 500 MG tablet Take 500 mg by mouth daily.      No current facility-administered medications for this visit.    PAST MEDICAL HISTORY: Past Medical History  Diagnosis Date  . Diabetes mellitus   . Hyperlipidemia   . Osteoarthritis   . Osteoporosis   . Hypertension   . Obesity   . Upper GI bleed     AV malformation/when anticoag  . Sacroiliac joint dysfunction 04/01/2012  . Encephalopathy   . Memory loss     PAST SURGICAL HISTORY: Past Surgical History  Procedure Laterality Date  . Appendectomy    . Cholecystectomy    . Colon resection  1988    secondary to cancer  . Polypectomy  1998    benign X 2  . Cervicitis  1964    conization   . Total knee arthroplasty  07/2009    right   . Endometrial polyp  05/2000    hyperplasia-laser treatment  . Joint replacement      Rt knee replacement    FAMILY HISTORY: Family History  Problem Relation Age of Onset  . Stroke Father   . Cancer Brother     lung  . Kidney failure Mother     SOCIAL HISTORY:  Social History   Social History  . Marital Status: Widowed    Spouse Name: N/A  . Number of Children: 1  . Years of Education: 2 yrs coll   Occupational  History  . Retired    Social History Main Topics  . Smoking status: Former Smoker    Types: Cigarettes    Quit date: 08/14/1962  . Smokeless tobacco: Never Used     Comment: Quit over 10 years ago  . Alcohol Use: No  . Drug Use: No  . Sexual Activity: Not on file   Other Topics Concern  . Not on file   Social History Narrative   Lives with son who is mentally impaired.     She is temporarily in [facility] for rehab.   Right-handed.   No caffeine use.     PHYSICAL EXAM   Filed Vitals:   09/23/15  1346  BP: 105/63  Pulse: 64    Not recorded      There is no weight on file to calculate BMI.  PHYSICAL EXAMNIATION:  Gen: NAD, conversant, well nourised, obese, well groomed                     Cardiovascular: Regular rate rhythm, no peripheral edema, warm, nontender. Eyes: Conjunctivae clear without exudates or hemorrhage Neck: Supple, no carotid bruise. Pulmonary: Clear to auscultation bilaterally   NEUROLOGICAL EXAM:  MENTAL STATUS: Speech:    Speech is normal; fluent and spontaneous with normal comprehension.  Cognition:Mini-Mental Status Examination 19/30, animal naming 6     Orientation: She is not oriented to date, year, month, day     recent and remote memory: She missed 3/3 recalls      Attention span and concentration, she has difficulties world backwards     Normal Language, naming, repeating,spontaneous speech     Fund of knowledge   CRANIAL NERVES: CN II: Visual fields are full to confrontation. Fundoscopic exam is normal with sharp discs and no vascular changes. Pupils are round equal and briskly reactive to light. CN III, IV, VI: extraocular movement are normal. No ptosis. CN V: Facial sensation is intact to pinprick in all 3 divisions bilaterally. Corneal responses are intact.  CN VII: Face is symmetric with normal eye closure and smile. CN VIII: Hearing is normal to rubbing fingers CN IX, X: Palate elevates symmetrically. Phonation is  normal. CN XI: Head turning and shoulder shrug are intact CN XII: Tongue is midline with normal movements and no atrophy.  MOTOR: There is no pronator drift of out-stretched arms. Muscle bulk and tone are normal. Muscle strength is normal.  REFLEXES: Reflexes are 2+ and symmetric at the biceps, triceps, knees, and ankles. Plantar responses are flexor.  SENSORY: Mildly length dependent decreased light touch, pinprick and vibratory sensation to distal shin level  COORDINATION: Rapid alternating movements and fine finger movements are intact. There is no dysmetria on finger-to-nose and heel-knee-shin.    GAIT/STANCE: She needs assistance to get up from seated position, wide based, stiff, cautious, unsteady gait  DIAGNOSTIC DATA (LABS, IMAGING, TESTING) - I reviewed patient records, labs, notes, testing and imaging myself where available.   ASSESSMENT AND PLAN  MARISOL GIAMBRA is a 82 y.o. 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 Ph.D.  [facility] [address] [place],  [address] Ph: [phone] Fax: [phone]  CC: Abner Greenspan, MD

## 2015-09-24 LAB — VITAMIN B12: VITAMIN B 12: 842 pg/mL (ref 211–946)

## 2015-09-24 LAB — FOLATE: Folate: >20 ng/mL (ref >3.0)

## 2015-09-25 ENCOUNTER — Telehealth: Payer: Self-pay | Admitting: Neurology

## 2015-09-25 DIAGNOSIS — R531 Weakness: Secondary | ICD-10-CM | POA: Diagnosis not present

## 2015-09-25 NOTE — Telephone Encounter (Signed)
[address] [phone] called to schedule MRI

## 2015-09-26 ENCOUNTER — Non-Acute Institutional Stay (SKILLED_NURSING_FACILITY): Payer: Medicare Other | Admitting: Internal Medicine

## 2015-09-26 ENCOUNTER — Encounter: Payer: Self-pay | Admitting: Internal Medicine

## 2015-09-26 DIAGNOSIS — I6300 Cerebral infarction due to thrombosis of unspecified precerebral artery: Secondary | ICD-10-CM

## 2015-09-26 DIAGNOSIS — R2681 Unsteadiness on feet: Secondary | ICD-10-CM

## 2015-09-26 DIAGNOSIS — R1310 Dysphagia, unspecified: Secondary | ICD-10-CM | POA: Diagnosis not present

## 2015-09-26 DIAGNOSIS — E785 Hyperlipidemia, unspecified: Secondary | ICD-10-CM | POA: Diagnosis not present

## 2015-09-26 DIAGNOSIS — I10 Essential (primary) hypertension: Secondary | ICD-10-CM | POA: Diagnosis not present

## 2015-09-26 DIAGNOSIS — I729 Aneurysm of unspecified site: Secondary | ICD-10-CM | POA: Diagnosis not present

## 2015-09-26 DIAGNOSIS — K5901 Slow transit constipation: Secondary | ICD-10-CM

## 2015-09-26 NOTE — Telephone Encounter (Signed)
Returned call and left VM, patient has been sent to [facility] they will need to call them to scheduled. Their phone number is [phone].

## 2015-09-26 NOTE — Progress Notes (Signed)
Patient ID: Erin [name], female   DOB: 03-Dec-1928, 82 y.o.   MRN: [id number]     LOCATION: [facility]  PCP: Blanchie Serve, MD   Code Status: Full Code  Goals of care: Advanced Directive information Advanced Directives 09/26/2015  Does patient have an advance directive? Yes  Type of Advance Directive Out of facility DNR (pink MOST or yellow form)  Does patient want to make changes to advanced directive? -  Copy of advanced directive(s) in chart? Yes  Pre-existing out of facility DNR order (yellow form or pink MOST form) Pink MOST form placed in chart (order not valid for inpatient use);Yellow form placed in chart (order not valid for inpatient use)       Extended Emergency Contact Information Primary Emergency Contact: [facility] Address: [address]          [place], [place] [address] [place] Phone: [phone] Work Phone: [phone] Mobile Phone: [phone] Relation: Niece Secondary Emergency Contact: Radcliffe,Annette Address: [address]          [place], [place] [address] [place] Mobile Phone: [phone] Relation: Niece   Allergies  Allergen Reactions  . Fluoxetine Hcl Other (See Comments)     vomiting  . Hydrocodone     Nausea and vomiting   . Shellfish Allergy Nausea And Vomiting    Just feels "sick'  . Diclofenac Sodium Other (See Comments)    : vomiting    Chief Complaint  Patient presents with  . Medical Management of Chronic Issues     HPI:  Patient is a 82 y.o. female seen today for routine visit. She has been working with therapy team and has made some progress. She requires 1 person assist with transfers. No fall reported. No new skin concern. She is here for short term rehabilitation post hospital admission from 08/25/15-08/29/15 with acute encephalopathy in setting of UTI and possible parkinson's dementia. She has been seen by neurology and note reviewed. Her HPI and ROS limited with her dementia.     Review of Systems:  Constitutional: Negative for fever, chills, diaphoresis.  HENT: Negative for headache, congestion, nasal discharge Eyes: Negative for blurred vision, double vision and discharge. Wears glasses. Respiratory: Negative for cough, shortness of breath and wheezing.   Cardiovascular: Negative for chest pain, palpitations, leg swelling.  Gastrointestinal: Negative for heartburn, nausea, vomiting, abdominal pain.  Genitourinary: Negative for dysuria Musculoskeletal: Negative for back pain, fall in the facility.  Skin: Negative for itching, rash.  Neurological: Negative for dizziness. Psychiatric/Behavioral: Negative for depression   Past Medical History  Diagnosis Date  . Diabetes mellitus   . Hyperlipidemia   . Osteoarthritis   . Osteoporosis   . Hypertension   . Obesity   . Upper GI bleed     AV malformation/when anticoag  . Sacroiliac joint dysfunction 04/01/2012  . Encephalopathy   . Memory loss      Medications:   Medication List       This list is accurate as of: 09/26/15  2:29 PM.  Always use your most recent med list.               acetaminophen 500 MG tablet  Commonly known as:  TYLENOL  Take 1,000 mg by mouth every 4 (four) hours as needed.     amantadine 100 MG capsule  Commonly known as:  SYMMETREL  Take 1 capsule (100 mg total) by mouth every morning.     aspirin 325 MG tablet  Take 325 mg by mouth daily.     atorvastatin 10 MG tablet  Commonly known as:  LIPITOR  TAKE 1 TABLET BY MOUTH DAILY     bisacodyl 10 MG suppository  Commonly known as:  DULCOLAX  Place 10 mg rectally. Place one per rectal one dose may repeat if no BM results     calcium carbonate 600 MG Tabs tablet  Commonly known as:  OS-CAL  Take 600 mg by mouth daily with breakfast.     metoprolol 50 MG tablet  Commonly known as:  LOPRESSOR  Take 1 tablet (50 mg total) by mouth 2 (two) times daily.     multivitamin with minerals tablet  Take 1 tablet by  mouth daily.     NUTRITIONAL SUPPLEMENT PO  Take Med Pass 120 ml by mouth twice a day due to poor appetite or oral intake.     pantoprazole 40 MG tablet  Commonly known as:  PROTONIX  Take 1 tablet (40 mg total) by mouth daily.     senna-docusate 8.6-50 MG tablet  Commonly known as:  Senokot-S  Take 1 tablet by mouth 2 (two) times daily.     vitamin C 500 MG tablet  Commonly known as:  ASCORBIC ACID  Take 500 mg by mouth daily.         Physical Exam: Filed Vitals:   09/26/15 1017  BP: 113/59  Pulse: 78  Temp: 98.5 F (36.9 C)  Resp: 20  Height: 5' 7.5" (1.715 m)  Weight: 193 lb (87.544 kg)  SpO2: 94%   Body mass index is 29.76 kg/(m^2).  General- elderly female, well built, in no acute distress Head- normocephalic, atraumatic Nose- no maxillary or frontal sinus tenderness, no nasal discharge Throat- moist mucus membrane, wears dentures Eyes- PERRLA, EOMI, no pallor, no icterus, no discharge Neck- no cervical lymphadenopathy Cardiovascular- normal s1,s2, no murmur, no leg edema Respiratory- bilateral clear to auscultation, no wheeze, no rhonchi, no crackles, no use of accessory muscles Abdomen- bowel sounds present, soft, non tender Musculoskeletal- able to move all 4 extremities, generalized weakness Neurological- alert and oriented to person only Skin- warm and dry Psychiatry- normal mood and affect    Labs reviewed: Basic Metabolic Panel:  Recent Labs  08/25/15 1718 08/26/15 0211 08/28/15 0525 08/28/15 1001 08/29/15 08/29/15 0547 09/03/15  NA  --  140 139  --  139 139 138  K  --  3.5 3.3*  --   --  3.8 3.9  CL  --  105 103  --   --  102  --   CO2  --  21* 22  --   --  26  --   GLUCOSE  --  97 110*  --   --  91  --   BUN  --  10 <5*  --  6 6 17   CREATININE  --  1.08* 0.88  --  1.0 0.99 1.0  CALCIUM  --  9.4 9.2  --   --  9.6  --   MG 1.3*  --   --  1.4*  --   --   --    Liver Function Tests:  Recent Labs  08/25/15 1718 08/26/15 0211  08/28/15 0525 09/03/15  AST 28 28 27  33  ALT 14 14 14 13   ALKPHOS 57 56 60 78  BILITOT 1.1 0.9 0.8  --   PROT 8.0 7.5 8.0  --   ALBUMIN 3.8 3.6 3.5  --  No results for input(s): LIPASE, AMYLASE in the last 8760 hours.  Recent Labs  08/27/15 1334  AMMONIA 24   CBC:  Recent Labs  07/23/15 0810 08/25/15 1220 08/26/15 0211 08/28/15 08/28/15 0525 09/03/15  WBC 7.2 7.3 7.3 6.2 6.2 8.9  NEUTROABS 3.9 4.5  --   --   --   --   HGB 11.2* 13.8 12.5  --  13.3 14.2  HCT 33.7* 41.6 39.1  --  40.2 44  MCV 86.7 88.7 88.7  --  86.6  --   PLT 179.0 166 157  --  182 212     Assessment/Plan  Dysphagia Continue mechanical soft diet with thin liquids, aspiration precautions  Carotid aneurysm Pending mri brain and neck to evaluate further. Continue aspirin  History of CVA Continue aspirin and statin  Unsteady gait Likely multifactorial, pending mri c-spine and mri brain and neck for further evaluation, being followed by neurology. Will have patient work with PT/OT as tolerated to regain strength and restore function.  Fall precautions are in place.  HLD Continue atorvastatin Lipid Panel     Component Value Date/Time   CHOL 128 08/26/2015 0211   TRIG 86 08/26/2015 0211   HDL 42 08/26/2015 0211   CHOLHDL 3.0 08/26/2015 0211   VLDL 17 08/26/2015 0211   LDLCALC 69 08/26/2015 0211   LDLDIRECT 39.0 07/23/2015 0810    HTN Monitor bp, continue lopressor 50 mg bid  Constipation Continue senokot s and dulcolax suppository and monitor    Goals of care: short term rehabilitation   Labs/tests ordered: none   Family/ staff Communication: reviewed care plan with patient and nursing supervisor.    Blanchie Serve, MD Internal Medicine [facility] [address] [place],  [address] Cell Phone (Monday-Friday 8 am - 5 pm): [phone] On Call: [phone] and follow prompts after 5 pm and on weekends Office Phone:  [phone] Office Fax: [phone]

## 2015-09-29 ENCOUNTER — Non-Acute Institutional Stay (SKILLED_NURSING_FACILITY): Payer: Medicare Other | Admitting: Internal Medicine

## 2015-09-29 ENCOUNTER — Encounter: Payer: Self-pay | Admitting: Internal Medicine

## 2015-09-29 DIAGNOSIS — K5901 Slow transit constipation: Secondary | ICD-10-CM | POA: Diagnosis not present

## 2015-09-29 DIAGNOSIS — N3281 Overactive bladder: Secondary | ICD-10-CM

## 2015-09-29 DIAGNOSIS — K644 Residual hemorrhoidal skin tags: Secondary | ICD-10-CM

## 2015-09-29 DIAGNOSIS — N952 Postmenopausal atrophic vaginitis: Secondary | ICD-10-CM | POA: Diagnosis not present

## 2015-09-29 DIAGNOSIS — K648 Other hemorrhoids: Secondary | ICD-10-CM

## 2015-09-29 NOTE — Telephone Encounter (Signed)
THANK YOU

## 2015-09-29 NOTE — Telephone Encounter (Signed)
Erin [name] called back to set up MRI and was given the phone # for GI.

## 2015-09-29 NOTE — Progress Notes (Signed)
Patient ID: Erin [name], female   DOB: [birth year], 82 y.o.   MRN: [id number]     LOCATION: [facility]  PCP: Blanchie Serve, MD   Code Status: Full Code  Goals of care: Advanced Directive information Advanced Directives 09/26/2015  Does patient have an advance directive? Yes  Type of Advance Directive Out of facility DNR (pink MOST or yellow form)  Does patient want to make changes to advanced directive? -  Copy of advanced directive(s) in chart? Yes  Pre-existing out of facility DNR order (yellow form or pink MOST form) Pink MOST form placed in chart (order not valid for inpatient use);Yellow form placed in chart (order not valid for inpatient use)       Extended Emergency Contact Information Primary Emergency Contact: [facility] Address: [address]          [place], [place] [address] [place] Phone: [phone] Work Phone: [phone] Mobile Phone: [phone] Relation: Niece Secondary Emergency Contact: Radcliffe,Annette Address: [address]          [place], [place] [address] [place] Mobile Phone: [phone] Relation: Niece   Allergies  Allergen Reactions  . Fluoxetine Hcl Other (See Comments)     vomiting  . Hydrocodone     Nausea and vomiting   . Shellfish Allergy Nausea And Vomiting    Just feels "sick'  . Diclofenac Sodium Other (See Comments)    : vomiting    Chief Complaint  Patient presents with  . Acute Visit    Family concerns and hemorrhoids     HPI:  Patient is a 82 y.o. female seen today for acute visit. She complaints of rectal itching x 3 days. She has history of hemorrhoids. She has been constipated. She also complaints of vaginal itching. She has been having this for few weeks but was not brought to the nurses attention. She denies vaginal discharge or bleed. She has increased urinary frequency and urgency.   Review of Systems:  Constitutional: Negative for fever.  HENT: Negative for  headache Eyes: Negative for blurred vision Respiratory: Negative for cough, shortness of breath and wheezing.   Cardiovascular: Negative for chest pain, palpitations, leg swelling.  Gastrointestinal: Negative for heartburn, nausea, vomiting, abdominal pain.  Genitourinary: Negative for dysuria Musculoskeletal: Negative for back pain, fall in the facility.  Skin: Negative for itching, rash.  Neurological: Negative for dizziness. Psychiatric/Behavioral: Negative for depression   Past Medical History  Diagnosis Date  . Diabetes mellitus   . Hyperlipidemia   . Osteoarthritis   . Osteoporosis   . Hypertension   . Obesity   . Upper GI bleed     AV malformation/when anticoag  . Sacroiliac joint dysfunction 04/01/2012  . Encephalopathy   . Memory loss      Medications:   Medication List       This list is accurate as of: 09/29/15  3:57 PM.  Always use your most recent med list.               acetaminophen 500 MG tablet  Commonly known as:  TYLENOL  Take 1,000 mg by mouth every 4 (four) hours as needed.     amantadine 100 MG capsule  Commonly known as:  SYMMETREL  Take 1 capsule (100 mg total) by mouth every morning.     aspirin 325 MG tablet  Take 325 mg by mouth daily.     atorvastatin 10 MG tablet  Commonly known as:  LIPITOR  TAKE 1 TABLET BY MOUTH DAILY     calcium carbonate 600 MG Tabs tablet  Commonly known as:  OS-CAL  Take 600 mg by mouth daily with breakfast.     metoprolol 50 MG tablet  Commonly known as:  LOPRESSOR  Take 1 tablet (50 mg total) by mouth 2 (two) times daily.     multivitamin with minerals tablet  Take 1 tablet by mouth daily.     NUTRITIONAL SUPPLEMENT PO  Take Med Pass 120 ml by mouth twice a day due to poor appetite or oral intake.     pantoprazole 40 MG tablet  Commonly known as:  PROTONIX  Take 1 tablet (40 mg total) by mouth daily.     vitamin C 500 MG tablet  Commonly known as:  ASCORBIC ACID  Take 500 mg by mouth daily.          Physical Exam: Filed Vitals:   09/29/15 1553  BP: 143/54  Pulse: 62  Temp: 97.4 F (36.3 C)  TempSrc: Oral  Resp: 18  Height: 5\' 10"  (1.778 m)  Weight: 193 lb 3.2 oz (87.635 kg)  SpO2: 98%   Body mass index is 27.72 kg/(m^2).  General- elderly female, well built, in no acute distress Head- normocephalic, atraumatic Nose- no maxillary or frontal sinus tenderness, no nasal discharge Throat- moist mucus membrane, wears dentures Eyes- PERRLA, EOMI, no pallor, no icterus, no discharge Neck- no cervical lymphadenopathy Cardiovascular- normal s1,s2, no murmur, no leg edema Respiratory- bilateral clear to auscultation, no wheeze, no rhonchi, no crackles, no use of accessory muscles Abdomen- bowel sounds present, soft, non tender, 2 external hemorrhoids present, no bleed in rectal area, has vaginal atrophy and vaginal dryness, no erythema or discharge noted Musculoskeletal- able to move all 4 extremities, generalized weakness Neurological- alert and oriented to person only Skin- warm and dry Psychiatry- normal mood and affect    Labs reviewed: Basic Metabolic Panel:  Recent Labs  08/25/15 1718 08/26/15 0211 08/28/15 0525 08/28/15 1001 08/29/15 08/29/15 0547 09/03/15  NA  --  140 139  --  139 139 138  K  --  3.5 3.3*  --   --  3.8 3.9  CL  --  105 103  --   --  102  --   CO2  --  21* 22  --   --  26  --   GLUCOSE  --  97 110*  --   --  91  --   BUN  --  10 <5*  --  6 6 17   CREATININE  --  1.08* 0.88  --  1.0 0.99 1.0  CALCIUM  --  9.4 9.2  --   --  9.6  --   MG 1.3*  --   --  1.4*  --   --   --    Liver Function Tests:  Recent Labs  08/25/15 1718 08/26/15 0211 08/28/15 0525 09/03/15  AST 28 28 27  33  ALT 14 14 14 13   ALKPHOS 57 56 60 78  BILITOT 1.1 0.9 0.8  --   PROT 8.0 7.5 8.0  --   ALBUMIN 3.8 3.6 3.5  --    No results for input(s): LIPASE, AMYLASE in the last 8760 hours.  Recent Labs  08/27/15 1334  AMMONIA 24   CBC:  Recent Labs   07/23/15 0810 08/25/15 1220 08/26/15 0211 08/28/15 08/28/15 0525 09/03/15  WBC 7.2 7.3 7.3 6.2 6.2 8.9  NEUTROABS 3.9 4.5  --   --   --   --  HGB 11.2* 13.8 12.5  --  13.3 14.2  HCT 33.7* 41.6 39.1  --  40.2 44  MCV 86.7 88.7 88.7  --  86.6  --   PLT 179.0 166 157  --  182 212     Assessment/Plan  External hemorrhoids Not bleeding, not painful, start anusol rectal cream 2.5% bid x 2 weeks, then bid prn  Constipation Start senna s 1 tab bid with miralax 17 g daily for 3 days, then miralax daily as needed. Hydration encouraged  OAB Start myrbetriq 25 mg bid and monitor  Vaginal atrophy with dryness Start premarin cream 0.625 mg/g 3 times a week for now and monitor, perineal hygiene to be maintained    Family/ staff Communication: reviewed care plan with patient, her niece over the phone and nursing supervisor. Answered the question from patient and her niece    Blanchie Serve, MD Internal Medicine [place], [place] [address] Cell Phone (Monday-Friday 8 am - 5 pm): [phone] On Call: [phone] and follow prompts after 5 pm and on weekends Office Phone: [phone] Office Fax: [phone]

## 2015-10-01 ENCOUNTER — Telehealth: Payer: Self-pay | Admitting: Internal Medicine

## 2015-10-01 ENCOUNTER — Telehealth: Payer: Self-pay

## 2015-10-01 DIAGNOSIS — F0390 Unspecified dementia without behavioral disturbance: Secondary | ICD-10-CM

## 2015-10-01 DIAGNOSIS — G20 Parkinson's disease: Secondary | ICD-10-CM

## 2015-10-01 DIAGNOSIS — G9340 Encephalopathy, unspecified: Secondary | ICD-10-CM

## 2015-10-01 DIAGNOSIS — I6300 Cerebral infarction due to thrombosis of unspecified precerebral artery: Secondary | ICD-10-CM

## 2015-10-01 NOTE — Telephone Encounter (Signed)
I will put in a hospice order now - please let me know in a nutshell how she is doing  I put her neurological dx in for the order -if these are not correct or will not work-please let me know  Will route to [facility] as well

## 2015-10-01 NOTE — Telephone Encounter (Signed)
Pt is being seen under palliative care and is recommended to go to hospice care. Will you be the attending? If so, would would you like to active the hospice care orders or old standing orders? cb number [phone] Thank you

## 2015-10-01 NOTE — Telephone Encounter (Signed)
Kelly left v/m requesting cb about pt being released from [facility] facility on 10/08/15; [name] request assistance in getting hospice referral. Hospice will be calling Dr Glori Bickers to see if she will be the attending. Pt will need 24 hour care and [name] will have family members and church family to help her. This is FYI to Dr Glori Bickers.

## 2015-10-01 NOTE — Telephone Encounter (Signed)
Thanks for letting me know - if you need me to place the referral I will  Happy to be the attending or help in any way

## 2015-10-02 NOTE — Telephone Encounter (Signed)
See 2nd phone note, Dr. Glori Bickers put in [facility] referral

## 2015-10-02 NOTE — Telephone Encounter (Signed)
I notified [name] and cancelled the lab and office visit appointments.

## 2015-10-02 NOTE — Telephone Encounter (Signed)
No -cancel labs, thanks

## 2015-10-02 NOTE — Telephone Encounter (Signed)
[name] called and said patient has an appointment for lab work and Dr.Tower in July.  Should they keep the appointments?  [name] can be reached at [phone].

## 2015-10-02 NOTE — Telephone Encounter (Signed)
Spoke to [place] at [employer]. She received referral and dx codes looks good. Will get pt set up for hospice. Thanks

## 2015-10-07 ENCOUNTER — Encounter: Payer: Self-pay | Admitting: Family

## 2015-10-07 ENCOUNTER — Non-Acute Institutional Stay (SKILLED_NURSING_FACILITY): Payer: Medicare Other | Admitting: Family

## 2015-10-07 DIAGNOSIS — K5901 Slow transit constipation: Secondary | ICD-10-CM

## 2015-10-07 DIAGNOSIS — R269 Unspecified abnormalities of gait and mobility: Secondary | ICD-10-CM | POA: Diagnosis not present

## 2015-10-07 DIAGNOSIS — I729 Aneurysm of unspecified site: Secondary | ICD-10-CM

## 2015-10-07 DIAGNOSIS — Z780 Asymptomatic menopausal state: Secondary | ICD-10-CM

## 2015-10-07 DIAGNOSIS — E785 Hyperlipidemia, unspecified: Secondary | ICD-10-CM | POA: Diagnosis not present

## 2015-10-07 DIAGNOSIS — G20 Parkinson's disease: Secondary | ICD-10-CM

## 2015-10-07 DIAGNOSIS — I10 Essential (primary) hypertension: Secondary | ICD-10-CM

## 2015-10-07 NOTE — Progress Notes (Signed)
Patient ID: Erin [name], female   DOB: 08/07/1928, 82 y.o.   MRN: [id number]  Location:    Nursing Home Room Number: [id number] Place of Service:  SNF (31)  Provider:Dinah Ngetich FNP-C   PCP: Blanchie Serve, MD Patient Care Team: Blanchie Serve, MD as PCP - General (Internal Medicine)  Extended Emergency Contact Information Primary Emergency Contact: [facility] Address: [address]          [place], [place] [address] [place] Phone: [phone] Work Phone: [phone] Mobile Phone: [phone] Relation: Niece Secondary Emergency Contact: Radcliffe,Annette Address: [address]          [place], [place] [address] [place] Mobile Phone: [phone] Relation: Niece  Code Status:DNR  Goals of care:  Advanced Directive information Advanced Directives 09/26/2015  Does patient have an advance directive? Yes  Type of Advance Directive Out of facility DNR (pink MOST or yellow form)  Does patient want to make changes to advanced directive? -  Copy of advanced directive(s) in chart? Yes  Pre-existing out of facility DNR order (yellow form or pink MOST form) Pink MOST form placed in chart (order not valid for inpatient use);Yellow form placed in chart (order not valid for inpatient use)     Allergies  Allergen Reactions  . Fluoxetine Hcl Other (See Comments)     vomiting  . Hydrocodone     Nausea and vomiting   . Shellfish Allergy Nausea And Vomiting    Just feels "sick'  . Diclofenac Sodium Other (See Comments)    : vomiting    Chief Complaint  Patient presents with  . Discharge Note    Discharge Visit    HPI:  82 y.o. female seen today at [facility] and rehabilitation for discharge home. She was here for short term rehabilitation post hospital admission from 08/25/15-08/29/15 with acute encephalopathy in setting of UTI and possible parkinson's dementia. She was started on antibiotics and amantadine after neurology consult. CVA  was ruled out in the hospital. She was noted to have a 6 mm right carotid terminus aneurysm and IR was consulted. Angiogram was deferred and palliative care was consulted. She has worked with PT/OT and Speech Therapist.She continue to require assistance with ADL's.She will be discharged home on Hospice service.She does not require any DME has own walker. Home health services will be arranged by facility social worker prior to discharge. Prescription medication will be written x 1 month then patient to follow up with PCP in 1-2 weeks. She denies any acute issues this visit. Facility staff report no new concerns.     Past Medical History  Diagnosis Date  . Diabetes mellitus   . Hyperlipidemia   . Osteoarthritis   . Osteoporosis   . Hypertension   . Obesity   . Upper GI bleed     AV malformation/when anticoag  . Sacroiliac joint dysfunction 04/01/2012  . Encephalopathy   . Memory loss     Past Surgical History  Procedure Laterality Date  . Appendectomy    . Cholecystectomy    . Colon resection  1988    secondary to cancer  . Polypectomy  1998    benign X 2  . Cervicitis  1964    conization   . Total knee arthroplasty  07/2009    right   . Endometrial polyp  05/2000    hyperplasia-laser treatment  . Joint replacement      Rt knee replacement  reports that she quit smoking about 53 years ago. Her smoking use included Cigarettes. She has never used smokeless tobacco. She reports that she does not drink alcohol or use illicit drugs. Social History   Social History  . Marital Status: Widowed    Spouse Name: N/A  . Number of Children: 1  . Years of Education: 2 yrs coll   Occupational History  . Retired    Social History Main Topics  . Smoking status: Former Smoker    Types: Cigarettes    Quit date: 08/14/1962  . Smokeless tobacco: Never Used     Comment: Quit over 10 years ago  . Alcohol Use: No  . Drug Use: No  . Sexual Activity: Not on file   Other Topics  Concern  . Not on file   Social History Narrative   Lives with son who is mentally impaired.     She is temporarily in [facility] for rehab.   Right-handed.   No caffeine use.   Functional Status Survey:    Allergies  Allergen Reactions  . Fluoxetine Hcl Other (See Comments)     vomiting  . Hydrocodone     Nausea and vomiting   . Shellfish Allergy Nausea And Vomiting    Just feels "sick'  . Diclofenac Sodium Other (See Comments)    : vomiting    Pertinent  Health Maintenance Due  Topic Date Due  . URINE MICROALBUMIN  01/24/2015  . MAMMOGRAM  05/14/2020 (Originally 11/10/2013)  . INFLUENZA VACCINE  11/11/2015  . OPHTHALMOLOGY EXAM  01/21/2016  . HEMOGLOBIN A1C  02/25/2016  . FOOT EXAM  07/29/2016  . DEXA SCAN  Completed  . PNA vac Low Risk Adult  Completed    Medications:   Medication List       This list is accurate as of: 10/07/15  6:02 PM.  Always use your most recent med list.               acetaminophen 500 MG tablet  Commonly known as:  TYLENOL  Take 1,000 mg by mouth every 4 (four) hours as needed.     amantadine 100 MG capsule  Commonly known as:  SYMMETREL  Take 1 capsule (100 mg total) by mouth every morning.     aspirin 325 MG tablet  Take 325 mg by mouth daily.     atorvastatin 10 MG tablet  Commonly known as:  LIPITOR  TAKE 1 TABLET BY MOUTH DAILY     calcium carbonate 600 MG Tabs tablet  Commonly known as:  OS-CAL  Take 600 mg by mouth daily with breakfast.     conjugated estrogens vaginal cream  Commonly known as:  PREMARIN  Place 1 Applicatorful vaginally 3 (three) times a week. 3 times a week on Monday, Wednesday and Friday     hydrocortisone 2.5 % rectal cream  Commonly known as:  ANUSOL-HC  Place 1 application rectally 2 (two) times daily. Stop date 10/13/15. Then 2 times daily as needed     metoprolol 50 MG tablet  Commonly known as:  LOPRESSOR  Take 1 tablet (50 mg total) by mouth 2 (two) times daily.     multivitamin with  minerals tablet  Take 1 tablet by mouth daily.     MYRBETRIQ 25 MG Tb24 tablet  Generic drug:  mirabegron ER  Take 25 mg by mouth daily.     NUTRITIONAL SUPPLEMENT PO  Take Med Pass 120 ml by mouth twice a day due  to poor appetite or oral intake.     pantoprazole 40 MG tablet  Commonly known as:  PROTONIX  Take 1 tablet (40 mg total) by mouth daily.     polyethylene glycol packet  Commonly known as:  MIRALAX / GLYCOLAX  Take 17 g by mouth daily.     sennosides-docusate sodium 8.6-50 MG tablet  Commonly known as:  SENOKOT-S  Take 1 tablet by mouth 2 (two) times daily.     vitamin C 500 MG tablet  Commonly known as:  ASCORBIC ACID  Take 500 mg by mouth daily.        Review of Systems  Constitutional: Negative for fever, chills, activity change, appetite change and fatigue.  HENT: Negative for congestion, rhinorrhea, sinus pressure, sneezing and sore throat.   Eyes: Negative.   Respiratory: Negative for cough, chest tightness, shortness of breath and wheezing.   Cardiovascular: Negative for chest pain, palpitations and leg swelling.  Gastrointestinal: Negative for nausea, vomiting, abdominal pain, diarrhea, constipation and abdominal distention.  Endocrine: Negative.   Genitourinary: Negative for dysuria, urgency, frequency, flank pain, vaginal discharge, vaginal pain and pelvic pain.  Musculoskeletal: Positive for gait problem.  Skin: Negative.   Neurological: Negative for dizziness, seizures, light-headedness and headaches.  Psychiatric/Behavioral: Negative for hallucinations, confusion, sleep disturbance and agitation. The patient is not nervous/anxious.     Filed Vitals:   10/07/15 1214  BP: 143/54  Pulse: 62  Temp: 97.4 F (36.3 C)  TempSrc: Oral  Resp: 18  Height: 5\' 10"  (1.778 m)  Weight: 193 lb 3.2 oz (87.635 kg)  SpO2: 98%   Body mass index is 27.72 kg/(m^2). Physical Exam  Constitutional: She appears well-developed and well-nourished. No distress.    HENT:  Head: Normocephalic.  Mouth/Throat: Oropharynx is clear and moist.  Eyes: Conjunctivae and EOM are normal. Pupils are equal, round, and reactive to light. Right eye exhibits no discharge. Left eye exhibits no discharge. No scleral icterus.  Neck: Normal range of motion. No JVD present. No thyromegaly present.  Cardiovascular: Normal rate, regular rhythm, normal heart sounds and intact distal pulses.  Exam reveals no gallop and no friction rub.   No murmur heard. Pulmonary/Chest: Effort normal and breath sounds normal. No respiratory distress. She has no wheezes. She has no rales.  Abdominal: Soft. Bowel sounds are normal. She exhibits no distension. There is no tenderness. There is no rebound and no guarding.  Musculoskeletal: She exhibits no edema or tenderness.  Unsteady gait   Lymphadenopathy:    She has no cervical adenopathy.  Neurological: She is alert.  Skin: Skin is warm and dry. No rash noted. No erythema.  Psychiatric: She has a normal mood and affect.    Labs reviewed: Basic Metabolic Panel:  Recent Labs  08/25/15 1718 08/26/15 0211 08/28/15 0525 08/28/15 1001 08/29/15 08/29/15 0547 09/03/15  NA  --  140 139  --  139 139 138  K  --  3.5 3.3*  --   --  3.8 3.9  CL  --  105 103  --   --  102  --   CO2  --  21* 22  --   --  26  --   GLUCOSE  --  97 110*  --   --  91  --   BUN  --  10 <5*  --  6 6 17   CREATININE  --  1.08* 0.88  --  1.0 0.99 1.0  CALCIUM  --  9.4 9.2  --   --  9.6  --   MG 1.3*  --   --  1.4*  --   --   --    Liver Function Tests:  Recent Labs  08/25/15 1718 08/26/15 0211 08/28/15 0525 09/03/15  AST 28 28 27  33  ALT 14 14 14 13   ALKPHOS 57 56 60 78  BILITOT 1.1 0.9 0.8  --   PROT 8.0 7.5 8.0  --   ALBUMIN 3.8 3.6 3.5  --    No results for input(s): LIPASE, AMYLASE in the last 8760 hours.  Recent Labs  08/27/15 1334  AMMONIA 24   CBC:  Recent Labs  07/23/15 0810 08/25/15 1220 08/26/15 0211 08/28/15 08/28/15 0525  09/03/15  WBC 7.2 7.3 7.3 6.2 6.2 8.9  NEUTROABS 3.9 4.5  --   --   --   --   HGB 11.2* 13.8 12.5  --  13.3 14.2  HCT 33.7* 41.6 39.1  --  40.2 44  MCV 86.7 88.7 88.7  --  86.6  --   PLT 179.0 166 157  --  182 212   Cardiac Enzymes:  Recent Labs  08/25/15 2031 08/26/15 0211 08/26/15 1007 08/27/15 1127  CKTOTAL  --   --   --  118  TROPONINI 0.04* 0.04* 0.05*  --    BNP: Invalid input(s): POCBNP CBG:  Recent Labs  08/28/15 2134 08/29/15 0742 08/29/15 1136  GLUCAP 102* 100* 134*    Procedures and Imaging Studies During Stay: No results found.  Assessment/Plan:   1. Essential hypertension B/p stable. Continue on Metoprolol.  2. Hyperlipidemia LDL goal <100 Continue Atorvastatin 10 mg Tablet   3. Parkinson disease ([place]) Continue on amantadine 100 mg Capsule. Discharge home on Hospice service.   4. Post-menopausal Continue on Premarin vaginal cream.   5. Abnormality of gait Has worked well with PT/ OT. Will discharge home PT/OT to continue with ROM, Exercise, Gait stability and muscle strengthening. She does not require any DME states has own walker at home.Fall and safety precautions.   6. Aneurysm ([place]) She is status post short term rehabilitation post hospital admission from 08/25/15-08/29/15 with acute encephalopathy in setting of UTI and possible parkinson's dementia. She was started on antibiotics and amantadine after neurology consult. CVA was ruled out in the hospital. She was noted to have a 6 mm right carotid terminus aneurysm and IR was consulted. Angiogram was deferred and palliative care was consulted.She will be discharged home with hospice service.   7. Slow transit constipation Continue on Miralax daily and Senna-S twice daily.  Patient is being discharged with the following home health services:     Hospice service   Patient is being discharged with the following durable medical equipment:   None  Prescription medication  written x 1 month  supply.    Patient has been advised to f/u with their PCP in 1-2 weeks to bring them up to date on their rehab stay.  Social services at facility was responsible for arranging this appointment.  Pt was provided with a 30 day supply of prescriptions for medications and refills must be obtained from their PCP.  For controlled substances, a more limited supply may be provided adequate until PCP appointment only.  Future labs/tests needed:  CBC, BMP in 1-2 weeks with PCP

## 2015-10-08 ENCOUNTER — Inpatient Hospital Stay: Admission: RE | Admit: 2015-10-08 | Payer: Medicare Other | Source: Ambulatory Visit

## 2015-10-10 ENCOUNTER — Telehealth: Payer: Self-pay | Admitting: Internal Medicine

## 2015-10-10 NOTE — Telephone Encounter (Signed)
Erin [name] from hosp[facility] of [facility] called  Pt would like to be DNR and wants a verbal ok for that.  Also is it ok if Dr Judd Lien (hospice dr) can sign the form  cb number is [phone]

## 2015-10-12 NOTE — Telephone Encounter (Signed)
Yes, that is fine. 

## 2015-10-13 NOTE — Telephone Encounter (Signed)
Erin [name] with Hospice notified as instructed by telephone and verbalized understanding.

## 2015-10-15 ENCOUNTER — Telehealth: Payer: Self-pay | Admitting: Neurology

## 2015-10-15 NOTE — Telephone Encounter (Signed)
Pt's niece [name] called sts pt has not had imaging yet, waiting for PA of insurance. She is inquiring if 41mth f/u was for imaging test results and if so appt would need to be c/a or r/s

## 2015-10-16 NOTE — Telephone Encounter (Signed)
Returned call to [facility] and left detailed message (ok per DPR).  I ask her to call [facility] to schedule the MRA Head.  I also informed her that we are still attempting to get her MRI cervical approved.  Dr. Krista Blue notes have been faxed to [facility] appeals (Fax#[phone]) to see if they will approve the MRI cervical.

## 2015-10-16 NOTE — Telephone Encounter (Signed)
Erin [name] the patient was schedule and canceled her apts apparently. She has medicare which needs no approval, secondary BCBS which has been approved for the MRA but the MRI Cervical is not meeting medical necessity with them. You or Dr. Krista Blue could call and offer additional clinical information at [phone]. The patient just needs to call [facility] and reschedule the apts. Even if she does not have the approval for the one test she will still have the coverage from medicare.

## 2015-10-17 NOTE — Telephone Encounter (Signed)
Spoke with [name] and told her that MRA had been approved and MRI was still pending and she could go ahead an call [facility] to schedule. She stated that she would do that.

## 2015-10-21 ENCOUNTER — Ambulatory Visit
Admission: RE | Admit: 2015-10-21 | Discharge: 2015-10-21 | Disposition: A | Discharge status: Home / Self Care | Payer: Medicare Other | Source: Ambulatory Visit | Attending: Neurology | Admitting: Neurology

## 2015-10-21 DIAGNOSIS — R269 Unspecified abnormalities of gait and mobility: Secondary | ICD-10-CM

## 2015-10-21 DIAGNOSIS — I6300 Cerebral infarction due to thrombosis of unspecified precerebral artery: Secondary | ICD-10-CM

## 2015-10-21 DIAGNOSIS — M542 Cervicalgia: Secondary | ICD-10-CM

## 2015-10-21 DIAGNOSIS — G9340 Encephalopathy, unspecified: Secondary | ICD-10-CM

## 2015-10-21 DIAGNOSIS — I729 Aneurysm of unspecified site: Secondary | ICD-10-CM

## 2015-10-21 DIAGNOSIS — F0390 Unspecified dementia without behavioral disturbance: Secondary | ICD-10-CM

## 2015-10-22 ENCOUNTER — Encounter: Payer: Self-pay | Admitting: Neurology

## 2015-10-22 ENCOUNTER — Ambulatory Visit (INDEPENDENT_AMBULATORY_CARE_PROVIDER_SITE_OTHER): Admitting: Neurology

## 2015-10-22 VITALS — BP 130/67 | HR 67 | Ht 70.0 in | Wt 193.0 lb

## 2015-10-22 DIAGNOSIS — I639 Cerebral infarction, unspecified: Secondary | ICD-10-CM | POA: Diagnosis not present

## 2015-10-22 DIAGNOSIS — I6300 Cerebral infarction due to thrombosis of unspecified precerebral artery: Secondary | ICD-10-CM | POA: Diagnosis not present

## 2015-10-22 DIAGNOSIS — F0390 Unspecified dementia without behavioral disturbance: Secondary | ICD-10-CM | POA: Diagnosis not present

## 2015-10-22 DIAGNOSIS — I671 Cerebral aneurysm, nonruptured: Secondary | ICD-10-CM | POA: Diagnosis not present

## 2015-10-22 MED ORDER — MEMANTINE HCL 10 MG PO TABS
10.0000 mg | ORAL_TABLET | Freq: Two times a day (BID) | ORAL | Status: DC
Start: 2015-10-22 — End: 2016-01-02

## 2015-10-22 NOTE — Progress Notes (Signed)
Chief Complaint  Patient presents with  . Cerebrovascular Accident    MMSE 25/30 - 5 animals.  She is here with her neice, [name].  Feels her memory has slightly improved.  . Gait Problem    They would like to review her MRI and MRA results.      PATIENT: Erin [name] DOB: [birth year]  Chief Complaint  Patient presents with  . Cerebrovascular Accident    MMSE 25/30 - 5 animals.  She is here with her neice, [name].  Feels her memory has slightly improved.  . Gait Problem    They would like to review her MRI and MRA results.     HISTORICAL  Erin [name] is a 82 years old right-handed female, she is accompanied by her friend Fraser Din and her niece [name] at today's clinical visit, this is to follow-up her hospital admission in Aug 25 2015, she currently resides at [facility] and rehabilitation center. Her primary care physician is Dr.Marne A Tower.  I reviewed and summarized her hospital admission in May 2017, she had a past medical history of diabetes, hypertension, hyperlipidemia, history of colon cancer in 1988, had colectomy, but did not require chemotherapy radiation therapy.  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[employer], eventually retired at age 15, she was noted to have mild memory trouble since 2012, she lives with her only child who is at age 85, with mental retardation, at baseline, she is still independent living, she has difficulty with names, had quit driving in [id number] after right knee replacement, she had gradually worsening increased gait difficulty, also had left thoracic shingles in 2015, was treated with gabapentin for extended period of time, she was still able to go out with her family for grocery shopping, cook for  herself and her son, dress take a bath for herself, but she has become much less active, she spent most of her time which TV, tends to have irregular sleep patterns. With her worsening memory trouble, her niece [name] has to go over her on bill payment around 2016.  in Aug 25 2015, she was noted by her niece, who is also her power of attorney, that she has increased confusion, urinary incontinence episodes, increased unsteady gait, She was diagnosed with urinary tract infection, was treated with IV Rocephin, was discharged to current rehabilitation, but she is now back to her baseline yet, she has intermittent increased confusion, continue have urinary urgency, occasionally incontinence, significant gait abnormality. She also complains of mild constipations, chronic low back pain. Upon admission she was noted to have low-grade fever 99.2, hypertensive, with systolic blood pressure 696V, creatinine 1.2, LDL 69, normal B12, TSH, elevated ESR of 72, A1c 6.2, hypokalemia, magnesium 1.4, I personally reviewed CAT scan of the brain in May, mild atrophy, no acute abnormality MRI of the brain no acute ischemia, shows 6 mm right carotid terminus aneurysm,  2-D echo shows EF of 50-55%, carotid Doppler within normal limits, continue aspirin  EEG shows slowing but no epileptiform activity,   UPDATE October 22 2015: She is frustrated about memory loss, she has better appetite, she can sleep better, trouble falling into sleep, she use walker at home, she has no pain. Her niece [name]  reported that her gait is not back to her baseline, worrying about falling.  She needs help with her food.  I have personally reviewed MRI of the cervical spine in July 2017, multilevel cervical degenerative disc disease most severe at C4-5, with mild canal stenosis no evidence of cord compression. MRA of brain showed evidence of right MCA aneurysm about the size of 8x 4 mm  REVIEW OF SYSTEMS: Full 14 system review of systems performed  and notable only for as above  ALLERGIES: Allergies  Allergen Reactions  . Fluoxetine Hcl Other (See Comments)     vomiting  . Hydrocodone     Nausea and vomiting   . Shellfish Allergy Nausea And Vomiting    Just feels "sick'  . Diclofenac Sodium Other (See Comments)    : vomiting    HOME MEDICATIONS: Current Outpatient Prescriptions  Medication Sig Dispense Refill  . acetaminophen (TYLENOL) 500 MG tablet Take 1,000 mg by mouth every 4 (four) hours as needed.    Marland Kitchen aspirin 325 MG tablet Take 325 mg by mouth daily.     . calcium carbonate (OS-CAL) 600 MG TABS tablet Take 600 mg by mouth daily with breakfast.     . conjugated estrogens (PREMARIN) vaginal cream Place 1 Applicatorful vaginally 3 (three) times a week. 3 times a week on Monday, Wednesday and Friday    . hydrocortisone (ANUSOL-HC) 2.5 % rectal cream Place 1 application rectally 2 (two) times daily. Stop date 10/13/15. Then 2 times daily as needed    . metoprolol (LOPRESSOR) 50 MG tablet Take 1 tablet (50 mg total) by mouth 2 (two) times daily. 60 tablet 1  . Nutritional Supplements (NUTRITIONAL SUPPLEMENT PO) Take Med Pass 120 ml by mouth twice a day due to poor appetite or oral intake.    . pantoprazole (PROTONIX) 40 MG tablet Take 1 tablet (40 mg total) by mouth daily. 30 tablet 1  . sennosides-docusate sodium (SENOKOT-S) 8.6-50 MG tablet Take 1 tablet by mouth 2 (two) times daily.    . vitamin C (ASCORBIC ACID) 500 MG tablet Take 500 mg by mouth daily.      No current facility-administered medications for this visit.    PAST MEDICAL HISTORY: Past Medical History  Diagnosis Date  . Diabetes mellitus   . Hyperlipidemia   . Osteoarthritis   . Osteoporosis   . Hypertension   . Obesity   . Upper GI bleed     AV malformation/when anticoag  . Sacroiliac joint dysfunction 04/01/2012  . Encephalopathy   . Memory loss     PAST SURGICAL HISTORY: Past Surgical History  Procedure Laterality Date  . Appendectomy    .  Cholecystectomy    . Colon resection  1988    secondary to cancer  . Polypectomy  1998    benign X 2  . Cervicitis  1964    conization   . Total knee arthroplasty  07/2009    right   . Endometrial polyp  05/2000    hyperplasia-laser treatment  . Joint replacement      Rt knee replacement    FAMILY HISTORY: Family History  Problem Relation Age of Onset  . Stroke Father   . Cancer Brother     lung  . Kidney failure Mother     SOCIAL HISTORY:  Social History   Social History  . Marital Status: Widowed    Spouse Name: N/A  . Number of Children: 1  . Years of Education: 2 yrs coll  Occupational History  . Retired    Social History Main Topics  . Smoking status: Former Smoker    Types: Cigarettes    Quit date: 08/14/1962  . Smokeless tobacco: Never Used     Comment: Quit over 10 years ago  . Alcohol Use: No  . Drug Use: No  . Sexual Activity: Not on file   Other Topics Concern  . Not on file   Social History Narrative   Lives with son who is mentally impaired.     She is temporarily in [facility] for rehab.   Right-handed.   No caffeine use.     PHYSICAL EXAM   Filed Vitals:   10/22/15 1500  BP: 130/67  Pulse: 67  Height: 5' 10"  (1.778 m)  Weight: 193 lb (87.544 kg)    Not recorded      Body mass index is 27.69 kg/(m^2).  PHYSICAL EXAMNIATION:  Gen: NAD, conversant, well nourised, obese, well groomed                     Cardiovascular: Regular rate rhythm, no peripheral edema, warm, nontender. Eyes: Conjunctivae clear without exudates or hemorrhage Neck: Supple, no carotid bruise. Pulmonary: Clear to auscultation bilaterally   NEUROLOGICAL EXAM:  MENTAL STATUS: Speech:    Speech is normal; fluent and spontaneous with normal comprehension.  Cognition:Mini-Mental Status Examination 25 /30, animal naming 5     Orientation: She is not oriented to date, year, month, day     recent and remote memory: She missed 3/3 recalls      Attention  span and concentration, she has difficulties world backwards     Normal Language, naming, repeating,spontaneous speech     Fund of knowledge   CRANIAL NERVES: CN II: Visual fields are full to confrontation. Fundoscopic exam is normal with sharp discs and no vascular changes. Pupils are round equal and briskly reactive to light. CN III, IV, VI: extraocular movement are normal. No ptosis. CN V: Facial sensation is intact to pinprick in all 3 divisions bilaterally. Corneal responses are intact.  CN VII: Face is symmetric with normal eye closure and smile. CN VIII: Hearing is normal to rubbing fingers CN IX, X: Palate elevates symmetrically. Phonation is normal. CN XI: Head turning and shoulder shrug are intact CN XII: Tongue is midline with normal movements and no atrophy.  MOTOR: There is no pronator drift of out-stretched arms. Muscle bulk and tone are normal. Muscle strength is normal.  REFLEXES: Reflexes are 2+ and symmetric at the biceps, triceps, knees, and ankles. Plantar responses are flexor.  SENSORY: Mildly length dependent decreased light touch, pinprick and vibratory sensation to distal shin level  COORDINATION: Rapid alternating movements and fine finger movements are intact. There is no dysmetria on finger-to-nose and heel-knee-shin.    GAIT/STANCE: She needs assistance to get up from seated position, wide based, stiff, cautious, unsteady gait  DIAGNOSTIC DATA (LABS, IMAGING, TESTING) - I reviewed patient records, labs, notes, testing and imaging myself where available.   ASSESSMENT AND PLAN  Erin [name] is a 82 y.o. female   Dementia  Mini-Mental Status Examination 24 /30  most consistent with central nervous system degenerative disorder, such as Alzheimer's dementia  Laboratory failed to demonstrate triple etiology  Acute encephalopathy in May 2017 due to UTI, was treated with Rocephin  Add on Namenda 10 mg twice a day  Worsening gait abnormality  Likely  multifactorial, deconditioning, supratentorium small vessel disease,   No significant evidence  of cervical myelopathy  Continue moderate exercise  Right MCA aneurysm  I will refer her to neurosurgeon for second opinion  Marcial Pacas, M.D. Ph.D.  [facility] [address] [place], [place] [address] Ph: [phone] Fax: [phone]  CC: Abner Greenspan, MD

## 2015-10-23 ENCOUNTER — Telehealth: Payer: Self-pay | Admitting: Family Medicine

## 2015-10-23 ENCOUNTER — Telehealth: Payer: Self-pay | Admitting: Neurology

## 2015-10-23 NOTE — Telephone Encounter (Signed)
error 

## 2015-10-23 NOTE — Telephone Encounter (Signed)
It is ok to drink some coffee- but balance it out with water    (less caffeine and more water as a rule)  If drinking coffee causes bladder pain of course stop it.

## 2015-10-23 NOTE — Telephone Encounter (Signed)
Patient's niece,Kelly Slade,returned [place]'s call.  I let [name] know Dr.Tower's comments.  [name] asked for any calls to come to her.  She said patient is very confused.  I switched patient's home number to [facility] number.

## 2015-10-23 NOTE — Telephone Encounter (Signed)
Patient Name: Erin [name] DOB: 05-Nov-1928 Initial Comment Caller says wants to discuss the effects of caffeine on UTI Nurse Assessment Nurse: Roosvelt Maser, RN, Barnetta Chapel Date/Time (Eastern Time): 10/23/2015 11:47:18 AM Confirm and document reason for call. If symptomatic, describe symptoms. You must click the next button to save text entered. ---caller states she has UTI, is on abx and drinks coffee everyday and wants to make sure it wasnt going to make things worse. has been on abx since may Has the patient traveled out of the country within the last 30 days? ---Not Applicable Does the patient have any new or worsening symptoms? ---Yes Will a triage be completed? ---Yes Related visit to physician within the last 2 weeks? ---N/A Does the PT have any chronic conditions? (i.e. diabetes, asthma, etc.) ---Yes List chronic conditions. ---DM, chronic UTI Is this a behavioral health or substance abuse call? ---No Guidelines Guideline Title Affirmed Question Affirmed Notes Urinary Tract Infection on Antibiotic Follow-up Call - Female Diabetes mellitus or weak immune system (e.g., HIV positive, cancer chemotherapy, transplant patient) Final Disposition User See PCP When Office is Open (within 3 days) Roosvelt Maser, RN, [place] caller states she has caaregivers that she wil have to find a ride and already has appts set up that she needs to go to, does not think she can make it in to the office. just wants to know if she can drink coffee or not Disagree/Comply: Disagree Disagree/Comply Reason: Wait and see

## 2015-10-24 ENCOUNTER — Other Ambulatory Visit: Payer: Medicare Other

## 2015-10-28 ENCOUNTER — Telehealth: Payer: Self-pay

## 2015-10-28 NOTE — Telephone Encounter (Signed)
Erin [name] with [facility] left v/m; pt has been taking Senna S for constipation due to standing orders; Erin [name] wants to know if can increase to Senna S two tabs bid and if so Erin [name] will order from [facility]. Erin [name] request cb.

## 2015-10-28 NOTE — Telephone Encounter (Signed)
Erin [name] with [facility] wanted to verify that Dr Glori Bickers was still pts PCP. Erin [name] will fax care plan to Dr Glori Bickers.

## 2015-10-29 NOTE — Telephone Encounter (Signed)
Almyra Free notified Dr. Glori Bickers said it was okay to increase med to 2 pills BID, and verbalized understanding

## 2015-10-29 NOTE — Telephone Encounter (Signed)
Yes-can inc to 2 pills bid

## 2015-10-31 ENCOUNTER — Ambulatory Visit: Payer: Medicare Other | Admitting: Family Medicine

## 2015-11-05 ENCOUNTER — Telehealth: Payer: Self-pay | Admitting: Neurology

## 2015-11-05 ENCOUNTER — Telehealth: Payer: Self-pay

## 2015-11-05 ENCOUNTER — Telehealth: Payer: Self-pay | Admitting: Family Medicine

## 2015-11-05 NOTE — Telephone Encounter (Signed)
Pt's niece, [name] , says pt has been complaining of visual disturbances- she sees strings ( not floaters) , the feeling that someone is inside her head knocking it around ( not associated with headache) , more headaches than normal. She sees the strings throughout the day. Pt is on blood pressure medication. Please call [phone]

## 2015-11-05 NOTE — Telephone Encounter (Signed)
Spoke to [facility] (neice on [address]) - states her aunt has worsening intermittent symptoms of positional dizziness, unsteadiness, increased headaches and visual disturbances.  I offered to work her in for an appointment but she declined stating that her PCP is much closer to her home ([place], [place]).  She will take her in for an evaluation today.  She will call her for an appt, if PCP feels she needs to be seen by neurology.

## 2015-11-05 NOTE — Telephone Encounter (Signed)
Patient Name: Erin [name] DOB: 10/29/1928 Initial Comment Caller states aunt is having headaches and seeing lines in her vision. Nurse Assessment Nurse: Ronnald Ramp, RN, [place] Date/Time (Eastern Time): 11/05/2015 2:12:52 PM Confirm and document reason for call. If symptomatic, describe symptoms. You must click the next button to save text entered. ---Caller states she her aunt has been c/o increased head aches and visual changes since Saturday. The visual changes are new for the pt. Caller spoke with Neurology office and they wanted to work the pt into be seen, but caller wanted to try PCP since they are closer. Has the patient traveled out of the country within the last 30 days? ---No Does the patient have any new or worsening symptoms? ---Yes Will a triage be completed? ---Yes Related visit to physician within the last 2 weeks? ---Yes Does the PT have any chronic conditions? (i.e. diabetes, asthma, etc.) ---Yes List chronic conditions. ---Dementia, HTN Is this a behavioral health or substance abuse call? ---No Guidelines Guideline Title Affirmed Question Affirmed Notes Headache Loss of vision or double vision (Exception: same as prior migraines) Final Disposition User Go to ED Now (or PCP triage) Ronnald Ramp, RN, [place] Comments No appt available today. Told caller she either needed to call the neurology office back to get into see them or take her to the ED Referrals [place] UNDECIDED Disagree/Comply: Comply

## 2015-11-05 NOTE — Telephone Encounter (Signed)
Erin [name] returned call to office - her aunt's PCP was unable to see her today and suggested she go to the ED.  Erin [name] did not feel her symptoms warranted a visit to the ED (same symptoms, just worsening).  She would feel more comfortable having her evaluated here.  She has been placed on Carolyn's schedule.

## 2015-11-05 NOTE — Telephone Encounter (Signed)
[name] with [facility] left v.m requesting cb to verify Dr Glori Bickers wants pt to continue taking protonix and metoprolol.Please advise.

## 2015-11-05 NOTE — Telephone Encounter (Signed)
Pt has appt 11/06/15 at 2:15 with [facility].

## 2015-11-05 NOTE — Telephone Encounter (Signed)
Yes-unless she is having side effects or another doctor told her to stop them thanks

## 2015-11-06 ENCOUNTER — Telehealth: Payer: Self-pay | Admitting: *Deleted

## 2015-11-06 ENCOUNTER — Ambulatory Visit (INDEPENDENT_AMBULATORY_CARE_PROVIDER_SITE_OTHER): Admitting: Nurse Practitioner

## 2015-11-06 ENCOUNTER — Encounter: Payer: Self-pay | Admitting: Nurse Practitioner

## 2015-11-06 VITALS — BP 120/70 | HR 68 | Ht 70.0 in | Wt 200.6 lb

## 2015-11-06 DIAGNOSIS — R51 Headache: Secondary | ICD-10-CM

## 2015-11-06 DIAGNOSIS — E785 Hyperlipidemia, unspecified: Secondary | ICD-10-CM

## 2015-11-06 DIAGNOSIS — I6300 Cerebral infarction due to thrombosis of unspecified precerebral artery: Secondary | ICD-10-CM | POA: Diagnosis not present

## 2015-11-06 DIAGNOSIS — I10 Essential (primary) hypertension: Secondary | ICD-10-CM | POA: Diagnosis not present

## 2015-11-06 DIAGNOSIS — R519 Headache, unspecified: Secondary | ICD-10-CM

## 2015-11-06 DIAGNOSIS — F0390 Unspecified dementia without behavioral disturbance: Secondary | ICD-10-CM

## 2015-11-06 NOTE — Patient Instructions (Signed)
Taking Namenda twice daily for dementia Add gabapentin 100 mg at bedtime Use walker at all times for safe ambulation Follow-up with Dr. Krista Blue as planned

## 2015-11-06 NOTE — Progress Notes (Signed)
[facility]  PATIENT: Erin [name] DOB: 15-Feb-1929   REASON FOR VISIT: Follow-up for headaches, blurred vision, history of dementia and gait abnormality HISTORY FROM: Patient and niece Erin [name]    HISTORY OF PRESENT ILLNESS:Erin [name] is a 82 years old right-handed [name], she is accompanied by her friend Erin [name] and her niece Erin [name] at today's clinical visit, this is to follow-up her hospital admission in Aug 25 2015, she currently resides at [facility] rehabilitation center. Her primary care physician is Dr.Marne A Tower.  I reviewed and summarized her hospital admission in May 2017, she had a past medical history of diabetes, hypertension, hyperlipidemia, history of colon cancer in 1988, had colectomy, but did not require chemotherapy radiation therapy.  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 69, she was noted to have mild memory trouble since 2012, she lives with her only child who is at age 11, with mental retardation, at baseline, she is still independent living, she has difficulty with names, had quit driving in 3244 after right knee replacement, she had gradually worsening increased gait difficulty, also had left thoracic shingles in 2015, was treated with gabapentin for extended period of time, she was still able to go out with her family for grocery shopping, cook for herself and her son, dress take a bath for herself, but she has become much less active, she spent most of her time which TV, tends to have irregular sleep patterns. With her worsening memory trouble, her niece Erin [name] has to go over her on bill payment around 2016.  in Aug 25 2015, she was noted by her  niece, who is also her power of attorney, that she has increased confusion, urinary incontinence episodes, increased unsteady gait, She was diagnosed with urinary tract infection, was treated with IV Rocephin, was discharged to current rehabilitation, but she is now back to her baseline yet, she has intermittent increased confusion, continue have urinary urgency, occasionally incontinence, significant gait abnormality. She also complains of mild constipations, chronic low back pain. 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 Her niece Erin [name] reported that her gait is not back to her baseline, worrying about falling.  She needs help with her food. I have personally reviewed MRI of the cervical spine in July 2017, multilevel cervical degenerative disc disease most severe at C4-5, with mild canal stenosis no evidence of cord compression. MRA of brain showed evidence of right MCA aneurysm about the size of 8x 4 mm  UPDATE 07/27/2017[name], 82 year old [name] returns for urgent visit due to  headaches. She has had symptoms of positional dizziness and unsteadiness and increased headache and visual disturbances. She has a headache several times a week, sometimes awakening sometimes not. She has a history of hospital admission in May for unsteady gait urinary incontinence  and increased confusion. At baseline she has dementia. She continues to be frustrated about her memory loss however she has not started her medication Namenda. She returns for reevaluation  REVIEW OF SYSTEMS: Full 14 system review of systems performed and notable only for those listed, all others are neg:  Constitutional: neg  Cardiovascular: neg Ear/Nose/Throat: neg  Skin: neg Eyes: Blurred vision Respiratory: neg Gastroitestinal: neg  Hematology/Lymphatic: neg  Endocrine: neg Musculoskeletal: Walking difficulty Allergy/Immunology: neg Neurological: Memory loss dizziness headache Psychiatric: neg Sleep : Insomnia   ALLERGIES: Allergies  Allergen Reactions  . Fluoxetine Hcl Other (See Comments)     vomiting  . Hydrocodone     Nausea and vomiting   . Shellfish Allergy Nausea And Vomiting    Just feels "sick'  . Diclofenac Sodium Other (See Comments)    : vomiting    HOME MEDICATIONS: Outpatient Medications Prior to Visit  Medication Sig Dispense Refill  . acetaminophen (TYLENOL) 500 MG tablet Take 1,000 mg by mouth every 4 (four) hours as needed.    Marland Kitchen aspirin 325 MG tablet Take 325 mg by mouth daily.     . calcium carbonate (OS-CAL) 600 MG TABS tablet Take 600 mg by mouth daily with breakfast.     . conjugated estrogens (PREMARIN) vaginal cream Place 1 Applicatorful vaginally 3 (three) times a week. 3 times a week on Monday, Wednesday and Friday    . hydrocortisone (ANUSOL-HC) 2.5 % rectal cream Place 1 application rectally 2 (two) times daily. Stop date 10/13/15. Then 2 times daily as needed    . metoprolol (LOPRESSOR) 50 MG tablet Take 1 tablet (50 mg total) by mouth 2 (two) times daily. 60 tablet 1  . Nutritional Supplements (NUTRITIONAL SUPPLEMENT PO) Take Med Pass 120 ml by mouth twice a day due to poor appetite or oral intake.    . pantoprazole (PROTONIX) 40 MG tablet Take 1 tablet (40 mg total) by mouth daily. 30 tablet 1  . sennosides-docusate sodium (SENOKOT-S) 8.6-50  MG tablet Take 1 tablet by mouth 2 (two) times daily.    . vitamin C (ASCORBIC ACID) 500 MG tablet Take 500 mg by mouth daily.     . memantine (NAMENDA) 10 MG tablet Take 1 tablet (10 mg total) by mouth 2 (two) times daily. (Patient not taking: Reported on 11/06/2015) 60 tablet 11   No facility-administered medications prior to visit.     PAST MEDICAL HISTORY: Past Medical History:  Diagnosis Date  . Diabetes mellitus   . Encephalopathy   . Hyperlipidemia   . Hypertension   . Memory loss   . Obesity   . Osteoarthritis   . Osteoporosis   . Sacroiliac joint dysfunction 04/01/2012  . 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 APPENDECTOMY    . cervicitis  1964   conization   . CHOLECYSTECTOMY    . COLON RESECTION  1988   secondary to cancer  . endometrial polyp  05/2000   hyperplasia-laser treatment  . JOINT REPLACEMENT     Rt knee replacement  . POLYPECTOMY  1998   benign X 2  . TOTAL KNEE ARTHROPLASTY  07/2009   right  FAMILY HISTORY: Family History  Problem Relation Age of Onset  . Kidney failure Mother   . Stroke Father   . Cancer Brother     lung    SOCIAL HISTORY: Social History   Social History  . Marital status: Widowed    Spouse name: N/A  . Number of children: 1  . Years of education: 2 yrs coll   Occupational History  . Retired    Social History Main Topics  . Smoking status: Former Smoker    Types: Cigarettes    Quit date: 08/14/1962  . Smokeless tobacco: Never Used     Comment: Quit over 10 years ago  . Alcohol use No  . Drug use: No  . Sexual activity: Not on file   Other Topics Concern  . Not on file   Social History Narrative   Lives with son who is mentally impaired.     She is temporarily in [facility] for rehab.   Right-handed.   No caffeine use.     PHYSICAL EXAM  Vitals:   11/06/15 1407  BP: 116/70 lying 120/70 seated, 128/66  standing  Pulse: 68  Weight: 200 lb 9.6 oz (91 kg)  Height: '5\' 10"'$  (1.778 m)   Body mass index is 28.78 kg/m. Gen: NAD, conversant, well nourised, obese, well groomed                     Cardiovascular: Regular rate rhythm,  Eyes: Conjunctivae clear without exudates or hemorrhage Neck: Supple, no carotid bruit.  NEUROLOGICAL EXAM:  MENTAL STATUS: Speech:    Speech is normal; fluent and spontaneous with normal comprehension.  Cognition:Mini-Mental Status Examination 25 /30, animal naming 5 on 10/22/15 not repeated           CRANIAL NERVES: CN II: Visual fields are full to confrontation. Fundoscopic exam is normal with sharp discs and no vascular changes. Pupils are round equal and briskly reactive to light. Visual acuity 20/70 right correction 20/40 left CN III, IV, VI: extraocular movement are normal. No ptosis. CN V: Facial sensation is intact to pinprick in all 3 divisions bilaterally. Corneal responses are intact.  CN VII: Face is symmetric with normal eye closure and smile. CN VIII: Hearing is normal to rubbing fingers CN IX, X: Palate elevates symmetrically. Phonation is normal. CN XI: Head turning and shoulder shrug are intact CN XII: Tongue is midline with normal movements and no atrophy.  MOTOR:There is no pronator drift of out-stretched arms. Muscle bulk and tone are normal. Muscle strength is normal. REFLEXES:Reflexes are 2+ and symmetric at the biceps, triceps, knees, and ankles. Plantar responses are flexor. SENSORY:Mildly length dependent decreased light touch, pinprick and vibratory sensation to distal shin level COORDINATION:Rapid alternating movements and fine finger movements are intact. There is no dysmetria on finger-to-nose and heel-knee-shin.   GAIT/STANCE: In wheelchair not ambulated  DIAGNOSTIC DATA (LABS, IMAGING, TESTING) - I reviewed patient records, labs, notes, testing and imaging myself where available.  Lab Results  Component Value Date   WBC  8.9 09/03/2015   HGB 14.2 09/03/2015   HCT 44 09/03/2015   MCV 86.6 08/28/2015   PLT 212 09/03/2015      Component Value Date/Time   NA 138 09/03/2015   K 3.9 09/03/2015   CL 102 08/29/2015 0547   CO2 26 08/29/2015 0547   GLUCOSE 91 08/29/2015 0547   BUN 17 09/03/2015   CREATININE 1.0 09/03/2015   CREATININE 0.99 08/29/2015 0547   CALCIUM  9.6 08/29/2015 0547   PROT 8.0 08/28/2015 0525   ALBUMIN 3.5 08/28/2015 0525   AST 33 09/03/2015   ALT 13 09/03/2015   ALKPHOS 78 09/03/2015   BILITOT 0.8 08/28/2015 0525   GFRNONAA 51 (L) 08/29/2015 0547   GFRAA 59 (L) 08/29/2015 0547   Lab Results  Component Value Date   CHOL 128 08/26/2015   HDL Erin 08/26/2015   LDLCALC 69 08/26/2015   LDLDIRECT 39.0 07/23/2015   TRIG 86 08/26/2015   CHOLHDL 3.0 08/26/2015   Lab Results  Component Value Date   HGBA1C 6.2 (H) 08/25/2015   Lab Results  Component Value Date   TDHRCBUL84 536 09/23/2015   Lab Results  Component Value Date   TSH 1.216 08/25/2015      ASSESSMENT AND PLAN Dementia Most consistent with central nervous system degenerative disorder such as Alzheimer's dementia, laboratory studies were normal. History of acute encephalopathy May 2017 due to UTI                        Worsening gait abnormality Likely multifactorial, deconditioning, supratentorium small vessel disease, No significant evidence of cervical myelopathy  Headaches    will add gabapentin at night only                      The patient is a current patient of Dr. Krista Blue  who is out of the office today . This note is sent to the work in doctor.                        PLAN: Take Namenda twice daily for dementia Add gabapentin 100 mg at bedtime for headache Use walker at all times for safe ambulation Recommend she see her ophthalmologist Follow-up with Dr. Krista Blue as planned Dennie Bible, [facility], [facility], [facility] [address], [place], [place] [address] [phone] hit  her head

## 2015-11-06 NOTE — Telephone Encounter (Signed)
Called LMVM for nurse of Dr. Kathyrn Sheriff about referral 2nd opinion.   Appointment?

## 2015-11-06 NOTE — Telephone Encounter (Signed)
Erin [name] notified of Dr. Marliss Coots comments. Erin [name] said they needed the doctors okay to keep taking it because she is due for a refill, they will fill it and pt will keep taking meds

## 2015-11-07 NOTE — Progress Notes (Signed)
I have read the note, and I agree with the clinical assessment and plan.  Takeila Thayne A. Justyn Boyson, MD, PhD Certified in Neurology, Clinical Neurophysiology, Sleep Medicine, Pain Medicine and Neuroimaging  [facility] [address], [address] [place],  [address] [phone]  

## 2015-11-10 ENCOUNTER — Telehealth: Payer: Self-pay | Admitting: Nurse Practitioner

## 2015-11-10 MED ORDER — GABAPENTIN 100 MG PO CAPS: 100.0000 mg | ORAL_CAPSULE | Freq: Every day | ORAL | 5 refills | Status: DC

## 2015-11-10 NOTE — Addendum Note (Signed)
Addended byOliver Hum on: 11/10/2015 04:51 PM   Modules accepted: Orders

## 2015-11-10 NOTE — Telephone Encounter (Signed)
Pt's daughter called in stating pt does not have rx at pharmacy , gabapentin (NEURONTIN) 100 MG capsule. Please call to update.

## 2015-11-10 NOTE — Telephone Encounter (Signed)
Pt's daughter is calling back about the Rx gabapentin (NEURONTIN) 100 mg capsules.  Please call.

## 2015-11-10 NOTE — Telephone Encounter (Signed)
Called and left Crystal a message about Patient binging scheduled referral was sent on July 12 th I relayed to crystal on voicemail to call [place] RN back with detail's of apt. Crystal is the only one that schedules for Dr. Kathyrn Sheriff. Thanks Hinton Dyer.

## 2015-11-10 NOTE — Telephone Encounter (Signed)
Spoke to daughter.  Told her about referral waiting for call back, from [place] [facility].   Also will refill gabapentin 100mg  po qhs # 30 with 5 refill.  At [facility], in [place], [place].

## 2015-11-17 ENCOUNTER — Telehealth: Payer: Self-pay

## 2015-11-17 MED ORDER — TRIAMCINOLONE ACETONIDE 0.025 % EX CREA: 1.0000 "application " | TOPICAL_CREAM | Freq: Two times a day (BID) | CUTANEOUS | 1 refills | Status: DC

## 2015-11-17 NOTE — Telephone Encounter (Signed)
Please call in cream

## 2015-11-17 NOTE — Telephone Encounter (Signed)
Erin [name] from hospice in [place] [phone] Pt has a rash on the back of her right ear causing itching and redness. Wanting to know if hydrocortisone cream can be prescribed for pt. Last OV 07/30/15. Thanks.

## 2015-11-18 ENCOUNTER — Telehealth: Payer: Self-pay | Admitting: Family Medicine

## 2015-11-18 MED ORDER — TRIAMCINOLONE ACETONIDE 0.025 % EX CREA: 1.0000 "application " | TOPICAL_CREAM | Freq: Two times a day (BID) | CUTANEOUS | 1 refills | Status: DC

## 2015-11-18 NOTE — Telephone Encounter (Signed)
Pt has appt with Dr Deborra Medina on 11/19/15 at 11:45.

## 2015-11-18 NOTE — Telephone Encounter (Signed)
[facility]  Patient Name: Erin [name]  DOB: 09-01-1928    Initial Comment Caller states she is calling for her aunt a pt of Dr Glori Bickers, she is having nausea and headaches. Would it be ok to give her Pepto, not eating well.    Nurse Assessment  Nurse: Harlow Mares, RN, Suanne Marker Date/Time (Eastern Time): 11/18/2015 3:37:06 PM  Confirm and document reason for call. If symptomatic, describe symptoms. You must click the next button to save text entered. ---Caller states she is calling for her aunt a pt of Dr Glori Bickers, she is having nausea and headaches. Would it be ok to give her Pepto, not eating well. Reports that her nausea and has been going on for awhile but the HA and nausea are worse today. Reports that she has was taking some Memantin for memory and she stopped taking this med last week. (neurologist advised this could be causing nausea). She was also taking Gabapentin 100mg , last dose was Sunday. These are the only med changes. Erin [name] is a new med. She has reflux. Reports that her headache has lasted all day today. She took tylenol earlier, with no help. Niece states that patient has an aneurysm in the neck that the neurologist found.  Has the patient traveled out of the country within the last 30 days? ---Not Applicable  Does the patient have any new or worsening symptoms? ---Yes  Will a triage be completed? ---Yes  Related visit to physician within the last 2 weeks? ---Yes  Does the PT have any chronic conditions? (i.e. diabetes, asthma, etc.) ---Yes  List chronic conditions. ---dementia, aneurysm; HTN,  Is this a behavioral health or substance abuse call? ---No     Guidelines    Guideline Title Affirmed Question Affirmed Notes  Headache [1] MODERATE headache (e.g., interferes with normal activities) AND [2] present > 24 hours AND [3] unexplained (Exceptions: analgesics not tried, typical migraine, or headache part of viral illness)    Final Disposition  User   See Physician within [place], RN, [employer] not with the patient, patient at home caller at work. Advised nurse will need to have the caller on the phone for triage. Caller asked that nurse call her at work: [phone]. She will let patient know that nurse will be calling to answer the phone.  Scheduled appt with Dr. Marjory Lies on 11/19/15 @ 11:45am at the [facility] location, caller and patient voiced understanding. Advised caller to call back for any new/worsening symptoms, or for care advice/triage.   Referrals  REFERRED TO PCP OFFICE   Disagree/Comply: Comply

## 2015-11-18 NOTE — Telephone Encounter (Signed)
Spoke with Almyra Free and she is okay with [place] sending in Rx to [place], Rx sent and she will pick up med

## 2015-11-18 NOTE — Telephone Encounter (Signed)
I called  [facility] and they had # [phone] to call, which they did and LM for to return call to schedule appt.  I gave her, receptionist other # to call (home and Guss Bunde, niece).    I called and spoke to [place].  Told her about the NS office calling.   I gave her there # and she will call.

## 2015-11-19 ENCOUNTER — Ambulatory Visit (INDEPENDENT_AMBULATORY_CARE_PROVIDER_SITE_OTHER): Payer: Medicare Other | Admitting: Family Medicine

## 2015-11-19 ENCOUNTER — Encounter: Payer: Self-pay | Admitting: Family Medicine

## 2015-11-19 VITALS — BP 144/82 | HR 66 | Temp 98.0°F | Wt 199.2 lb

## 2015-11-19 DIAGNOSIS — G44099 Other trigeminal autonomic cephalgias (TAC), not intractable: Secondary | ICD-10-CM | POA: Diagnosis not present

## 2015-11-19 DIAGNOSIS — I639 Cerebral infarction, unspecified: Secondary | ICD-10-CM | POA: Diagnosis not present

## 2015-11-19 DIAGNOSIS — R350 Frequency of micturition: Secondary | ICD-10-CM

## 2015-11-19 DIAGNOSIS — R110 Nausea: Secondary | ICD-10-CM

## 2015-11-19 DIAGNOSIS — R519 Headache, unspecified: Secondary | ICD-10-CM | POA: Insufficient documentation

## 2015-11-19 DIAGNOSIS — R51 Headache: Secondary | ICD-10-CM

## 2015-11-19 LAB — POC URINALSYSI DIPSTICK (AUTOMATED)
BILIRUBIN UA: NEGATIVE
Glucose, UA: NEGATIVE
Ketones, UA: NEGATIVE
NITRITE UA: NEGATIVE
PH UA: 6.5
PROTEIN UA: NEGATIVE
RBC UA: NEGATIVE
Spec Grav, UA: 1.025
UROBILINOGEN UA: 0.2

## 2015-11-19 NOTE — Progress Notes (Signed)
Subjective:   Patient ID: Erin [name], female    DOB: December 19, 1928, 82 y.o.   MRN: [id number]  Erin [name] is a pleasant 82 y.o. year old female pt of Dr. Glori Bickers, new to me, who presents to clinic today with Headache and Nausea  on 11/19/2015  HPI:  Has been followed by neurology for headaches. Most recently seen by Evlyn Courier, NP/ Dr. Felecia Shelling on 11/06/15. Note reviewed.  Gabapentin 100 mg nightly was added.  Advised to see her opthalmologist.  Namenda twice daily was started.  Nausea started shortly after starting namenda so they stopped it last week. She has not vomited but feels persistently nauseated.  No diarrhea.  No abdominal pain.  No fever.  She has not called neurology about persistent headaches and nausea.  Hospice nurse wanted a physician to "lay eyes on her today."  Dr. Glori Bickers just sent in rx for compazine for pt for nausea but she has not taken it yet.  Current Outpatient Prescriptions on File Prior to Visit  Medication Sig Dispense Refill  . acetaminophen (TYLENOL) 500 MG tablet Take 1,000 mg by mouth every 4 (four) hours as needed.    Marland Kitchen aspirin 325 MG tablet Take 325 mg by mouth daily.     . calcium carbonate (OS-CAL) 600 MG TABS tablet Take 600 mg by mouth daily with breakfast.     . conjugated estrogens (PREMARIN) vaginal cream Place 1 Applicatorful vaginally 3 (three) times a week. 3 times a week on Monday, Wednesday and Friday    . hydrocortisone (ANUSOL-HC) 2.5 % rectal cream Place 1 application rectally 2 (two) times daily. Stop date 10/13/15. Then 2 times daily as needed    . metoprolol (LOPRESSOR) 50 MG tablet Take 1 tablet (50 mg total) by mouth 2 (two) times daily. 60 tablet 1  . Nutritional Supplements (NUTRITIONAL SUPPLEMENT PO) Take Med Pass 120 ml by mouth twice a day due to poor appetite or oral intake.    . pantoprazole (PROTONIX) 40 MG tablet Take 1 tablet (40 mg total) by mouth daily. 30 tablet 1  . sennosides-docusate sodium (SENOKOT-S) 8.6-50 MG tablet  Take 1 tablet by mouth 2 (two) times daily.    Marland Kitchen triamcinolone (KENALOG) 0.025 % cream Apply 1 application topically 2 (two) times daily. To affected area as needed 30 g 1  . vitamin C (ASCORBIC ACID) 500 MG tablet Take 500 mg by mouth daily.     Marland Kitchen gabapentin (NEURONTIN) 100 MG capsule Take 1 capsule (100 mg total) by mouth at bedtime. (Patient not taking: Reported on 11/19/2015) 30 capsule 5  . memantine (NAMENDA) 10 MG tablet Take 1 tablet (10 mg total) by mouth 2 (two) times daily. (Patient not taking: Reported on 11/19/2015) 60 tablet 11   No current facility-administered medications on file prior to visit.     Allergies  Allergen Reactions  . Fluoxetine Hcl Other (See Comments)     vomiting  . Hydrocodone     Nausea and vomiting   . Shellfish Allergy Nausea And Vomiting    Just feels "sick'  . Diclofenac Sodium Other (See Comments)    : vomiting    Past Medical History:  Diagnosis Date  . Diabetes mellitus   . Encephalopathy   . Hyperlipidemia   . Hypertension   . Memory loss   . Obesity   . Osteoarthritis   . Osteoporosis   . Sacroiliac joint dysfunction 04/01/2012  . Upper GI bleed    AV malformation/when anticoag  Past Surgical History:  Procedure Laterality Date  . APPENDECTOMY    . cervicitis  1964   conization   . CHOLECYSTECTOMY    . COLON RESECTION  1988   secondary to cancer  . endometrial polyp  05/2000   hyperplasia-laser treatment  . JOINT REPLACEMENT     Rt knee replacement  . POLYPECTOMY  1998   benign X 2  . TOTAL KNEE ARTHROPLASTY  07/2009   right     Family History  Problem Relation Age of Onset  . Kidney failure Mother   . Stroke Father   . Cancer Brother     lung    Social History   Social History  . Marital status: Widowed    Spouse name: N/A  . Number of children: 1  . Years of education: 2 yrs coll   Occupational History  . Retired    Social History Main Topics  . Smoking status: Former Smoker    Types: Cigarettes     Quit date: 08/14/1962  . Smokeless tobacco: Never Used     Comment: Quit over 10 years ago  . Alcohol use No  . Drug use: No  . Sexual activity: Not on file   Other Topics Concern  . Not on file   Social History Narrative   Lives with son who is mentally impaired.     She is temporarily in [facility] for rehab.   Right-handed.   No caffeine use.   The PMH, PSH, Social History, Family History, Medications, and allergies have been reviewed in [facility], and have been updated if relevant.   Review of Systems  Constitutional: Negative.   Respiratory: Negative.   Cardiovascular: Negative.   Gastrointestinal: Positive for nausea. Negative for vomiting.  Genitourinary: Negative.   Neurological: Positive for headaches.  All other systems reviewed and are negative.      Objective:    BP (!) 144/82   Pulse 66   Temp 98 F (36.7 C) (Oral)   Wt 199 lb 4 oz (90.4 kg)   SpO2 98%   BMI 28.59 kg/m    Physical Exam  Constitutional: She is oriented to person, place, and time. She appears well-developed and well-nourished. No distress.  HENT:  Head: Normocephalic.  Eyes: Conjunctivae are normal.  Cardiovascular: Normal rate.   Pulmonary/Chest: Effort normal.  Abdominal: Soft.  Neurological: She is alert and oriented to person, place, and time.  Skin: She is not diaphoretic.  Psychiatric: She has a normal mood and affect. Her behavior is normal. Judgment and thought content normal.  Nursing note and vitals reviewed.         Assessment & Plan:   Other trigeminal autonomic cephalgia (TAC), not intractable  Nausea without vomiting No Follow-up on file.

## 2015-11-19 NOTE — Progress Notes (Signed)
Pre visit review using our clinic review tool, if applicable. No additional management support is needed unless otherwise documented below in the visit note. 

## 2015-11-19 NOTE — Assessment & Plan Note (Signed)
Does not currently have a headache. Advised to follow up with neurology as they may want to titrate up her gabapentin. The patient and her daughter indicate understanding of these issues and agrees with the plan.

## 2015-11-19 NOTE — Assessment & Plan Note (Addendum)
No vomiting. Could be from namenda still flushing from her body. UA pos for LE only- send for cx. Take compazine rx as directed by PCP as needed for nausea.

## 2015-11-21 LAB — URINE CULTURE

## 2015-11-26 ENCOUNTER — Other Ambulatory Visit: Payer: Self-pay

## 2015-11-26 MED ORDER — CEPHALEXIN 500 MG PO CAPS: 500.0000 mg | ORAL_CAPSULE | Freq: Two times a day (BID) | ORAL | 0 refills | Status: DC

## 2015-11-26 NOTE — Addendum Note (Signed)
Addended by: Modena Nunnery on: 11/26/2015 08:16 AM   Modules accepted: Orders

## 2015-11-27 ENCOUNTER — Telehealth: Payer: Self-pay

## 2015-11-27 ENCOUNTER — Other Ambulatory Visit: Payer: Self-pay | Admitting: Family Medicine

## 2015-11-27 NOTE — Telephone Encounter (Signed)
That is fine with me if she is ok with it - let me know if a verbal order is ok

## 2015-11-27 NOTE — Telephone Encounter (Signed)
Dr Ivery Quale with Hospice did assessment for pt to see if still Hospice eligible. Pt has shown improvement in last 6 - 8 weeks and is no longer hospice appropriate. Dr Ivery Quale said takes couple of days to get discharge orders in and request cb from Dr Glori Bickers about pt possibly being transitioned into palliative care where a NP would see pt. Dr Tomasa Hosteller is aware Dr Glori Bickers is out of office today and request cb on 11/28/15.

## 2015-11-28 NOTE — Telephone Encounter (Signed)
You have not prescribed this med in over a year, please advise

## 2015-11-28 NOTE — Telephone Encounter (Signed)
That is ok -please refil times 3

## 2015-12-01 NOTE — Telephone Encounter (Signed)
Dr. Glori Bickers spoke directly with Dr. Tomasa Hosteller

## 2015-12-02 ENCOUNTER — Telehealth: Payer: Self-pay | Admitting: Family Medicine

## 2015-12-02 NOTE — Telephone Encounter (Signed)
[name] called stating pt will be discharged from hospice care tomorrow and kelly wanted to know if pt needed to follow up with you

## 2015-12-02 NOTE — Telephone Encounter (Signed)
9/22 appointment [name] aware of appointment

## 2015-12-02 NOTE — Telephone Encounter (Signed)
F/u with me in about a month or so --30 min office visit please

## 2015-12-08 DIAGNOSIS — M79675 Pain in left toe(s): Secondary | ICD-10-CM | POA: Diagnosis not present

## 2015-12-08 DIAGNOSIS — M79674 Pain in right toe(s): Secondary | ICD-10-CM | POA: Diagnosis not present

## 2015-12-08 DIAGNOSIS — B351 Tinea unguium: Secondary | ICD-10-CM | POA: Diagnosis not present

## 2015-12-10 DIAGNOSIS — I10 Essential (primary) hypertension: Secondary | ICD-10-CM | POA: Diagnosis not present

## 2015-12-10 DIAGNOSIS — E7800 Pure hypercholesterolemia, unspecified: Secondary | ICD-10-CM | POA: Diagnosis not present

## 2015-12-10 DIAGNOSIS — H35033 Hypertensive retinopathy, bilateral: Secondary | ICD-10-CM | POA: Diagnosis not present

## 2015-12-10 DIAGNOSIS — H04123 Dry eye syndrome of bilateral lacrimal glands: Secondary | ICD-10-CM | POA: Diagnosis not present

## 2015-12-11 DIAGNOSIS — H4311 Vitreous hemorrhage, right eye: Secondary | ICD-10-CM | POA: Diagnosis not present

## 2016-01-02 ENCOUNTER — Ambulatory Visit (INDEPENDENT_AMBULATORY_CARE_PROVIDER_SITE_OTHER): Payer: Medicare Other | Admitting: Family Medicine

## 2016-01-02 ENCOUNTER — Encounter: Payer: Self-pay | Admitting: Family Medicine

## 2016-01-02 VITALS — BP 108/62 | HR 58 | Temp 98.2°F | Ht 70.0 in | Wt 191.8 lb

## 2016-01-02 DIAGNOSIS — F0390 Unspecified dementia without behavioral disturbance: Secondary | ICD-10-CM | POA: Diagnosis not present

## 2016-01-02 DIAGNOSIS — Z23 Encounter for immunization: Secondary | ICD-10-CM | POA: Diagnosis not present

## 2016-01-02 DIAGNOSIS — I10 Essential (primary) hypertension: Secondary | ICD-10-CM

## 2016-01-02 DIAGNOSIS — I729 Aneurysm of unspecified site: Secondary | ICD-10-CM

## 2016-01-02 DIAGNOSIS — G44099 Other trigeminal autonomic cephalgias (TAC), not intractable: Secondary | ICD-10-CM

## 2016-01-02 DIAGNOSIS — E1100 Type 2 diabetes mellitus with hyperosmolarity without nonketotic hyperglycemic-hyperosmolar coma (NKHHC): Secondary | ICD-10-CM

## 2016-01-02 DIAGNOSIS — I639 Cerebral infarction, unspecified: Secondary | ICD-10-CM

## 2016-01-02 DIAGNOSIS — E785 Hyperlipidemia, unspecified: Secondary | ICD-10-CM

## 2016-01-02 DIAGNOSIS — G9340 Encephalopathy, unspecified: Secondary | ICD-10-CM

## 2016-01-02 DIAGNOSIS — L309 Dermatitis, unspecified: Secondary | ICD-10-CM

## 2016-01-02 DIAGNOSIS — I6300 Cerebral infarction due to thrombosis of unspecified precerebral artery: Secondary | ICD-10-CM

## 2016-01-02 DIAGNOSIS — Z9181 History of falling: Secondary | ICD-10-CM

## 2016-01-02 NOTE — Patient Instructions (Addendum)
Flu shot today  Labs today  Keep moving- gradually increase your exercise (walking while assistance) Socialize as much as you can, stay engaged with other people Keep moisturizing your flaky areas (use dove soap)  Eat a balanced diet - be mindful of sugar  Keep following up with neurology , eye doctor and neurosurgeon  Keep 24 hour care to prevent falls  I'm so glad you are doing better !  Follow up in late April for next annual exam

## 2016-01-02 NOTE — Progress Notes (Signed)
Subjective:    Patient ID: Erin [name], female    DOB: Aug 23, 1928, 82 y.o.   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 r carotid terminus aneurysm  ED echo was nl and EEG was nl  Determined that she has had TIAs  (on full dose asa 325)   Was at [facility]- states she kind of "gave up"-not eating or drinking etc/fail to thrive  Then family support and faith brought her around     She is supposed to see neurosurgeon for aneurysm in the future   On review of her last neuro note- pt c/o headaches and positional dizziness/vision change  Was px gabapentin at bedtime for this  Also namenda bid for dementia -had to stop for nausea  Sees Evlyn Courier NP/Dr Felecia Shelling  Dx with Lonzo Candy autonomic cephalgia (TAC) Per pt her headache is improving - using acetaminophen   Went to opthy- dx with retinal hemorrhages - seeing a retina specialist  Is sleeping with head more elevated    Wt Readings from Last 3 Encounters:  01/02/16 191 lb 12 oz (87 kg)  11/19/15 199 lb 4 oz (90.4 kg)  11/06/15 200 lb 9.6 oz (91 kg)  bmi is 27.5  Nutrition Mobility status   Memory - some things she remembers and some things not  Did improve   Was tx for uti in aug with keflex  Nausea is much better after treating that   bp is stable today  No cp or palpitations or headaches or edema  No side effects to medicines  BP Readings from Last 3 Encounters:  01/02/16 108/62  11/19/15 (!) 144/82  11/06/15 120/70      DM2 Lab Results  Component Value Date   HGBA1C 6.2 (H) 08/25/2015    Seeing foot doctor for excess skin on heels - and soaking in salt water -getting better   Has a spot on R ear- bothersome--given  triamcinolone cream  Looked raw like a rash/ and scratched it   Is able to get up and walk with walker and assistance - can walk through the house  Caregiver is able to get her up and down   Appetite is better now - her caregiver cooks for her   Patient Active Problem List   Diagnosis Date Noted  . Eczema 01/02/2016  . Headache 11/19/2015  . Brain aneurysm 10/22/2015  . Dementia 09/23/2015  . Abnormality of gait 09/23/2015  . Aneurysm ([place])   . CVA (cerebral infarction)   . Goals of care, counseling/discussion   . Slow transit constipation   . Colon cancer screening 07/26/2014  . Elevated TSH 07/26/2014  . Elevated serum creatinine 01/24/2014  . Risk for falls 07/05/2012  . Colon cancer H/O 04/07/2012  . Sacroiliac joint dysfunction 04/01/2012  . Sciatica 03/31/2012  . Low back pain 04/02/2011  . Other screening mammogram 09/30/2010  . DM2 (diabetes mellitus, type 2) ([place]) 09/30/2010  . Post-menopausal 09/30/2010  . ABDOMINAL WALL HERNIA 02/26/2010  . Anemia 08/18/2009  . Essential hypertension 10/31/2007  . Hyperlipidemia LDL goal <100 07/26/2006  . HEMORRHOIDS 07/26/2006  . OSTEOARTHRITIS 07/26/2006  . Osteopenia 07/26/2006  . SLEEP DISORDER 07/26/2006   Past Medical History:  Diagnosis Date  .  Diabetes mellitus   . Encephalopathy   . Hyperlipidemia   . Hypertension   . Memory loss   . Obesity   . Osteoarthritis   . Osteoporosis   . Sacroiliac joint dysfunction 04/01/2012  . 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 APPENDECTOMY    . cervicitis  1964   conization   . CHOLECYSTECTOMY    . COLON RESECTION  1988   secondary to cancer  . endometrial polyp  05/2000   hyperplasia-laser treatment  . JOINT REPLACEMENT     Rt knee replacement  . POLYPECTOMY  1998   benign X 2  . TOTAL KNEE ARTHROPLASTY  07/2009   right    Social History  Substance Use Topics  . Smoking status: Former Smoker     Types: Cigarettes    Quit date: 08/14/1962  . Smokeless tobacco: Never Used     Comment: Quit over 10 years ago  . Alcohol use No   Family History  Problem Relation Age of Onset  . Kidney failure Mother   . Stroke Father   . Cancer Brother     lung   Allergies  Allergen Reactions  . Fluoxetine Hcl Other (See Comments)     vomiting  . Hydrocodone     Nausea and vomiting   . Shellfish Allergy Nausea And Vomiting    Just feels "sick'  . Diclofenac Sodium Other (See Comments)    : vomiting   Current Outpatient Prescriptions on File Prior to Visit  Medication Sig Dispense Refill  . acetaminophen (TYLENOL) 500 MG tablet Take 1,000 mg by mouth every 4 (four) hours as needed.    Marland Kitchen aspirin 325 MG tablet Take 325 mg by mouth daily.     . calcium carbonate (OS-CAL) 600 MG TABS tablet Take 600 mg by mouth daily with breakfast.     . gabapentin (NEURONTIN) 100 MG capsule Take 1 capsule (100 mg total) by mouth at bedtime. 30 capsule 5  . hydrocortisone (ANUSOL-HC) 2.5 % rectal cream Place 1 application rectally 2 (two) times daily. Stop date 10/13/15. Then 2 times daily as needed    . metoprolol (LOPRESSOR) 50 MG tablet Take 1 tablet (50 mg total) by mouth 2 (two) times daily. 60 tablet 1  . pantoprazole (PROTONIX) 40 MG tablet Take 1 tablet (40 mg total) by mouth daily. 30 tablet 1  . SENNA-S 8.6-50 MG tablet TAKE TWO (2) TABLETS BY MOUTH 2 TIMES DAILY FOR CONSTIPATION 120 tablet 3  . triamcinolone (KENALOG) 0.025 % cream Apply 1 application topically 2 (two) times daily. To affected area as needed 30 g 1  . vitamin C (ASCORBIC ACID) 500 MG tablet Take 500 mg by mouth daily.      No current facility-administered medications on file prior to visit.      Review of Systems Review of Systems  Constitutional: Negative for fever, appetite change, fatigue and unexpected weight change.  Eyes: Negative for pain and visual disturbance.  Respiratory: Negative for cough and shortness of breath.     Cardiovascular: Negative for cp or palpitations    Gastrointestinal: Negative for nausea, diarrhea and constipation.  Genitourinary: Negative for urgency and frequency.  Skin: Negative for pallor or rash   Neurological: Negative for weakness, light-headedness, numbness and pos for improving headaches.  Hematological: Negative for adenopathy. Does not bruise/bleed easily.  Psychiatric/Behavioral: Negative for dysphoric mood. The patient is not nervous/anxious. Pos for  dementia/cog slowing which has improved          Objective:   Physical Exam  Constitutional: She appears well-developed and well-nourished. No distress.  Frail appearing elderly female in a wheelchair   HENT:  Head: Normocephalic and atraumatic.  Mouth/Throat: Oropharynx is clear and moist.  Eyes: Conjunctivae and EOM are normal. Pupils are equal, round, and reactive to light.  Neck: Normal range of motion. Neck supple. No JVD present. Carotid bruit is not present. No thyromegaly present.  Cardiovascular: Normal rate, regular rhythm, normal heart sounds and intact distal pulses.  Exam reveals no gallop.   Pulmonary/Chest: Effort normal and breath sounds normal. No respiratory distress. She has no wheezes. She has no rales.  No crackles  Abdominal: Soft. Bowel sounds are normal. She exhibits no distension, no abdominal bruit and no mass. There is no tenderness.  Baseline scars  Musculoskeletal: She exhibits no edema.  Lymphadenopathy:    She has no cervical adenopathy.  Neurological: She is alert. She has normal reflexes. No cranial nerve deficit. She exhibits normal muscle tone. Coordination normal.  Skin: Skin is warm and dry. No rash noted. No pallor.  Mild flaking of skin behind R ear consistent with contact derm/eczema   Thickened skin on L heel- flaking   Psychiatric: She has a normal mood and affect. Her speech is normal and behavior is normal. Her mood appears not anxious. Her affect is not blunt and not labile.  Cognition and memory are impaired. She does not exhibit a depressed mood. She exhibits abnormal recent memory.  Poor short term memory- does repeat questions  Attentive and more mentally sharp than expected In good spirits           Assessment & Plan:   Problem List Items Addressed This Visit      Cardiovascular and Mediastinum   Essential hypertension - Primary    bp in fair control at this time  BP Readings from Last 1 Encounters:  01/02/16 108/62   No changes needed-continue metoprolol Disc lifstyle change with low sodium diet and exercise as tolerated      Relevant Orders   Comprehensive metabolic panel (Completed)   Aneurysm ([place])    Upcoming neurosurgeon appt to address and follow          Endocrine   DM2 (diabetes mellitus, type 2) ([place])    Due for A1C Has lost wt due to prolonged illness-appetite is improved now  Full time caregiver provides meals at this time-helpful  Continue to follow  For upcoming appt with opthy/retinal specialist      Relevant Orders   Hemoglobin A1c (Completed)     Nervous and Auditory   Dementia    Thought to be multi infarct Has improved considerably  Continue f/u with neuro Intol of namenda-caused nausea Disc imp of physical and mental activity - incl socialization  Consider reading once vision improves as well        CVA (cerebral infarction)    Suspect pt's recent encephalopathy and dementia may have been caused by TIAs  Seeing neuro-will continue to follow      RESOLVED: Acute encephalopathy    Rev hosp records - unsure if this was caused by infection or CVA /TIA or other  Resolved-doing better         Musculoskeletal and Integument   Eczema    Worse on L heel and behind R ear  Seeing podiatrist  Continue salt water soaks and moisturizers on heel to debride gradually  Continue triamcinolone cream behind ear  Update if no further imp Disc use of dove soap and avoidance of hot water         Other   Risk  for falls    Pt is deconditioned but gradually improving  Enc her caregivers to gradually inc her walking (with walker and assistance) in the house by 5 min per week  Also doing more for herself  Keep walker at all times/always supervised to prev falls Has had PT/rehab      Hyperlipidemia LDL goal <100    Disc goals for lipids and reasons to control them Rev labs with pt from last check in May Rev low sat fat diet in detail       Headache    This is followed by neuro/unsure of cause  Improving with PM gabapentin  Also on beta blocker  Enc good hydration and regular meals and caffeine avoidance       Other Visit Diagnoses    Need for influenza vaccination       Relevant Orders   Flu Vaccine QUAD 36+ mos IM (Completed)

## 2016-01-02 NOTE — Progress Notes (Signed)
Pre visit review using our clinic review tool, if applicable. No additional management support is needed unless otherwise documented below in the visit note. 

## 2016-01-03 LAB — HEMOGLOBIN A1C
Hgb A1c MFr Bld: 5.7 % — ABNORMAL HIGH (ref <5.7)
Mean Plasma Glucose: 117 mg/dL

## 2016-01-03 LAB — COMPREHENSIVE METABOLIC PANEL
ALBUMIN: 3.9 g/dL (ref 3.6–5.1)
ALK PHOS: 51 U/L (ref 33–130)
ALT: 4 U/L — AB (ref 6–29)
AST: 15 U/L (ref 10–35)
BILIRUBIN TOTAL: 0.4 mg/dL (ref 0.2–1.2)
BUN: 13 mg/dL (ref 7–25)
CALCIUM: 10 mg/dL (ref 8.6–10.4)
CO2: 21 mmol/L (ref 20–31)
CREATININE: 1.18 mg/dL — AB (ref 0.60–0.88)
Chloride: 104 mmol/L (ref 98–110)
Glucose, Bld: 113 mg/dL — ABNORMAL HIGH (ref 65–99)
Potassium: 4.1 mmol/L (ref 3.5–5.3)
SODIUM: 138 mmol/L (ref 135–146)
TOTAL PROTEIN: 7.3 g/dL (ref 6.1–8.1)

## 2016-01-04 ENCOUNTER — Encounter: Payer: Self-pay | Admitting: Family Medicine

## 2016-01-04 NOTE — Assessment & Plan Note (Signed)
Disc goals for lipids and reasons to control them Rev labs with pt from last check in May Rev low sat fat diet in detail

## 2016-01-04 NOTE — Assessment & Plan Note (Signed)
Suspect pt's recent encephalopathy and dementia may have been caused by TIAs  Seeing neuro-will continue to follow

## 2016-01-04 NOTE — Assessment & Plan Note (Signed)
Rev hosp records - unsure if this was caused by infection or CVA /TIA or other  Resolved-doing better

## 2016-01-04 NOTE — Assessment & Plan Note (Signed)
Thought to be multi infarct Has improved considerably  Continue f/u with neuro Intol of namenda-caused nausea Disc imp of physical and mental activity - incl socialization  Consider reading once vision improves as well

## 2016-01-04 NOTE — Assessment & Plan Note (Signed)
Due for A1C Has lost wt due to prolonged illness-appetite is improved now  Full time caregiver provides meals at this time-helpful  Continue to follow  For upcoming appt with opthy/retinal specialist

## 2016-01-04 NOTE — Assessment & Plan Note (Signed)
Worse on L heel and behind R ear  Seeing podiatrist  Continue salt water soaks and moisturizers on heel to debride gradually  Continue triamcinolone cream behind ear  Update if no further imp Disc use of dove soap and avoidance of hot water

## 2016-01-04 NOTE — Assessment & Plan Note (Signed)
Pt is deconditioned but gradually improving  Enc her caregivers to gradually inc her walking (with walker and assistance) in the house by 5 min per week  Also doing more for herself  Keep walker at all times/always supervised to prev falls Has had PT/rehab

## 2016-01-04 NOTE — Assessment & Plan Note (Signed)
This is followed by neuro/unsure of cause  Improving with PM gabapentin  Also on beta blocker  Enc good hydration and regular meals and caffeine avoidance

## 2016-01-04 NOTE — Assessment & Plan Note (Signed)
Upcoming neurosurgeon appt to address and follow

## 2016-01-04 NOTE — Assessment & Plan Note (Signed)
bp in fair control at this time  BP Readings from Last 1 Encounters:  01/02/16 108/62   No changes needed-continue metoprolol Disc lifstyle change with low sodium diet and exercise as tolerated

## 2016-01-05 ENCOUNTER — Encounter: Payer: Self-pay | Admitting: *Deleted

## 2016-01-09 DIAGNOSIS — H35011 Changes in retinal vascular appearance, right eye: Secondary | ICD-10-CM | POA: Diagnosis not present

## 2016-01-09 DIAGNOSIS — H4311 Vitreous hemorrhage, right eye: Secondary | ICD-10-CM | POA: Diagnosis not present

## 2016-01-22 ENCOUNTER — Telehealth: Payer: Self-pay | Admitting: Family Medicine

## 2016-01-22 NOTE — Telephone Encounter (Signed)
Pt called and said she has some questions for Dr. Glori Bickers regarding a call she got from [facility].  Can you please call her to discuss.

## 2016-01-22 NOTE — Telephone Encounter (Signed)
The referral came from her neurologist-not me / so I do not know how much help I will be.  What questions did she have?

## 2016-01-23 NOTE — Telephone Encounter (Signed)
If she has not had the visit with the neuro surgeon yet- go for the consultation and see what they say (surgery may not be what they recommend)-  however if she has already been seen and would like a 2nd op from another doctor let me know

## 2016-01-23 NOTE — Telephone Encounter (Signed)
Spoke with niece [name] and she just wanted me to ask Dr. Glori Bickers if she thinks she should get a 2nd opinion about having this major surgery, when pt was in Hospice the nurse there recommended a 2nd opinion before pt has such a major surgery, so [name] wants to know if you though a 2nd opinion would be a good idea or if pt should go straight to neurosurgeon for surgery, please advise

## 2016-01-26 ENCOUNTER — Telehealth: Payer: Self-pay

## 2016-01-26 ENCOUNTER — Ambulatory Visit (INDEPENDENT_AMBULATORY_CARE_PROVIDER_SITE_OTHER): Payer: BC Managed Care – PPO | Admitting: Primary Care

## 2016-01-26 ENCOUNTER — Encounter: Payer: Self-pay | Admitting: Primary Care

## 2016-01-26 VITALS — BP 144/72 | HR 66 | Temp 98.1°F | Wt 190.8 lb

## 2016-01-26 DIAGNOSIS — L03317 Cellulitis of buttock: Secondary | ICD-10-CM

## 2016-01-26 DIAGNOSIS — K5901 Slow transit constipation: Secondary | ICD-10-CM

## 2016-01-26 DIAGNOSIS — I639 Cerebral infarction, unspecified: Secondary | ICD-10-CM

## 2016-01-26 MED ORDER — SULFAMETHOXAZOLE-TRIMETHOPRIM 800-160 MG PO TABS: 1.0000 | ORAL_TABLET | Freq: Two times a day (BID) | ORAL | 0 refills | Status: DC

## 2016-01-26 MED ORDER — SENNOSIDES-DOCUSATE SODIUM 8.6-50 MG PO TABS: ORAL_TABLET | ORAL | 0 refills | Status: AC

## 2016-01-26 NOTE — Patient Instructions (Signed)
Start Bactrim DS (sulfamethoxazole/trimethoprim) tablets for skin infection/abscess. Take 1 tablet by mouth twice daily for 10 days.  Allow the site to drain as discussed.  Purchase barrier cream to the other areas of your buttocks to prevent skin breakdown. Do not apply this to your current infection.  Please return for a follow up visit Friday this week to ensure proper healing.   It was a pleasure meeting you!

## 2016-01-26 NOTE — Telephone Encounter (Signed)
Just warm water and pat dry. Allow the abscess to drain as discussed.

## 2016-01-26 NOTE — Telephone Encounter (Signed)
Left voicemail letting pt's niece [name] know what Dr. Glori Bickers said and to call back with any questions

## 2016-01-26 NOTE — Progress Notes (Signed)
Subjective:    Patient ID: Erin [name], female    DOB: 1928/05/08, 82 y.o.   MRN: [id number]  HPI  Ms. Ulloa is an 82 year old female who presents today with a chief complaint of buttocks pain. Her pain is located to the left lower buttocks for which she first noticed Thursday last week. Her niece has noticed increased redness, drainage, swelling over the weekend. She does sit in a recliner throughout most of her day and will ambulate with a walker while in the home. She denies fatigue, fevers, palpitations.  Review of Systems  Constitutional: Negative for fatigue and fever.  Musculoskeletal: Positive for arthralgias.  Skin: Positive for color change and wound.       Past Medical History:  Diagnosis Date  . Diabetes mellitus   . Encephalopathy   . Hyperlipidemia   . Hypertension   . Memory loss   . Obesity   . Osteoarthritis   . Osteoporosis   . Sacroiliac joint dysfunction 04/01/2012  . Upper GI bleed    AV malformation/when anticoag     Social History   Social History  . Marital status: Widowed    Spouse name: N/A  . Number of children: 1  . Years of education: 2 yrs coll   Occupational History  . Retired    Social History Main Topics  . Smoking status: Former Smoker    Types: Cigarettes    Quit date: 08/14/1962  . Smokeless tobacco: Never Used     Comment: Quit over 10 years ago  . Alcohol use No  . Drug use: No  . Sexual activity: Not on file   Other Topics Concern  . Not on file   Social History Narrative   Lives with son who is mentally impaired.     She is temporarily in [facility] for rehab.   Right-handed.   No caffeine use.    Past Surgical History:  Procedure Laterality Date  . APPENDECTOMY    . cervicitis  1964   conization   . CHOLECYSTECTOMY    . COLON RESECTION  1988   secondary to cancer  . endometrial polyp  05/2000   hyperplasia-laser treatment  . JOINT REPLACEMENT     Rt knee replacement  . POLYPECTOMY  1998   benign X 2    . TOTAL KNEE ARTHROPLASTY  07/2009   right     Family History  Problem Relation Age of Onset  . Kidney failure Mother   . Stroke Father   . Cancer Brother     lung    Allergies  Allergen Reactions  . Fluoxetine Hcl Other (See Comments)     vomiting  . Hydrocodone     Nausea and vomiting   . Shellfish Allergy Nausea And Vomiting    Just feels "sick'  . Diclofenac Sodium Other (See Comments)    : vomiting    Current Outpatient Prescriptions on File Prior to Visit  Medication Sig Dispense Refill  . acetaminophen (TYLENOL) 500 MG tablet Take 1,000 mg by mouth every 4 (four) hours as needed.    Marland Kitchen aspirin 325 MG tablet Take 325 mg by mouth daily.     . calcium carbonate (OS-CAL) 600 MG TABS tablet Take 600 mg by mouth daily with breakfast.     . gabapentin (NEURONTIN) 100 MG capsule Take 1 capsule (100 mg total) by mouth at bedtime. 30 capsule 5  . hydrocortisone (ANUSOL-HC) 2.5 % rectal cream Place 1 application rectally  2 (two) times daily. Stop date 10/13/15. Then 2 times daily as needed    . metoprolol (LOPRESSOR) 50 MG tablet Take 1 tablet (50 mg total) by mouth 2 (two) times daily. 60 tablet 1  . pantoprazole (PROTONIX) 40 MG tablet Take 1 tablet (40 mg total) by mouth daily. 30 tablet 1  . triamcinolone (KENALOG) 0.025 % cream Apply 1 application topically 2 (two) times daily. To affected area as needed 30 g 1  . vitamin C (ASCORBIC ACID) 500 MG tablet Take 500 mg by mouth daily.      No current facility-administered medications on file prior to visit.     BP (!) 144/72   Pulse 66   Temp 98.1 F (36.7 C) (Oral)   Wt 190 lb 12.8 oz (86.5 kg)   SpO2 98%   BMI 27.38 kg/m    Objective:   Physical Exam  Constitutional: She appears well-nourished. She does not appear ill.  Neck: Neck supple.  Cardiovascular: Normal rate and regular rhythm.   Pulmonary/Chest: Effort normal and breath sounds normal.  Skin: Skin is warm.  5 cm deep abscess to left lower buttocks.  Nonfluctuant. 0.5 cm open wound without drainage to center of abscess. Mild, well-healing pressure ulcer to left lower buttocks distal to abscess.          Assessment & Plan:  Abscess with Cellulitis:  5 cm deep, nonfluctuant, nondraining abscess to left mid/lower buttocks. Mild to moderate erythema, tender. Site cleansed and new dressing applied. Long discussion today regarding importance of regular position changes and refraining from laying dependently longer than 1 hour at a time. Prescription for Bactrim double strength tablets sent to pharmacy for a 10 day course. Discussed to allow abscess to drain. Discussed to apply barrier cream to prevent pressure ulcers/wounds. We will see her back in the office Friday this week for reevaluation of cellulitis.  Sheral Flow, NP

## 2016-01-26 NOTE — Progress Notes (Signed)
Pre visit review using our clinic review tool, if applicable. No additional management support is needed unless otherwise documented below in the visit note. 

## 2016-01-26 NOTE — Telephone Encounter (Signed)
[place],pts niece,(Do not see DPR signed) left v/m wanting to know what to clean the area with the abscess.Please advise.

## 2016-01-27 NOTE — Telephone Encounter (Signed)
Notified [name] (on [place]) of Kate's comments. [name] verbalized understanding.

## 2016-01-30 ENCOUNTER — Encounter: Payer: Self-pay | Admitting: Primary Care

## 2016-01-30 ENCOUNTER — Ambulatory Visit (INDEPENDENT_AMBULATORY_CARE_PROVIDER_SITE_OTHER): Payer: Medicare Other | Admitting: Primary Care

## 2016-01-30 VITALS — BP 122/72 | HR 68 | Temp 97.8°F | Wt 190.8 lb

## 2016-01-30 DIAGNOSIS — I639 Cerebral infarction, unspecified: Secondary | ICD-10-CM | POA: Diagnosis not present

## 2016-01-30 DIAGNOSIS — L03317 Cellulitis of buttock: Secondary | ICD-10-CM | POA: Diagnosis not present

## 2016-01-30 MED ORDER — CEFTRIAXONE SODIUM 1 G IJ SOLR
1.0000 g | Freq: Once | INTRAMUSCULAR | Status: AC
  Administered 2016-01-30: 1 g via INTRAMUSCULAR

## 2016-01-30 NOTE — Patient Instructions (Signed)
You were provided with an injection of Rocephin antibiotics. Continue taking the Bactrim DS tablets.  Continue warm compresses to allow for drainage.   You may take tylenol as needed for pain.  Please schedule a follow up appointment on Tuesday next week with either myself or Dr. Glori Bickers.  It was a pleasure to see you today!

## 2016-01-30 NOTE — Progress Notes (Signed)
Subjective:    Patient ID: Erin [name], female    DOB: 02-Oct-1928, 82 y.o.   MRN: [id number]  HPI  Erin [name] is an 82 year old female who presents today for follow up for abscess and cellulitis. She presented on Monday this week with complaints of left buttocks pain with swelling and redness. She was noted to have an abscess with surrounding cellulitis and was treated with Bactrim DS tablets. She was encouraged to apply warm compresses and allow abscess to drain.  Since her last visit she continues to experience discomfort. Her care giver has noticed some drainage and has been cleansing and changing dressings as directed. She denies fevers, changes in mental status, increased redness, streaking color to skin of buttocks.  Review of Systems  Constitutional: Negative for fatigue and fever.  Cardiovascular: Negative for palpitations.  Skin: Positive for wound.       Past Medical History:  Diagnosis Date  . Diabetes mellitus   . Encephalopathy   . Hyperlipidemia   . Hypertension   . Memory loss   . Obesity   . Osteoarthritis   . Osteoporosis   . Sacroiliac joint dysfunction 04/01/2012  . Upper GI bleed    AV malformation/when anticoag     Social History   Social History  . Marital status: Widowed    Spouse name: N/A  . Number of children: 1  . Years of education: 2 yrs coll   Occupational History  . Retired    Social History Main Topics  . Smoking status: Former Smoker    Types: Cigarettes    Quit date: 08/14/1962  . Smokeless tobacco: Never Used     Comment: Quit over 10 years ago  . Alcohol use No  . Drug use: No  . Sexual activity: Not on file   Other Topics Concern  . Not on file   Social History Narrative   Lives with son who is mentally impaired.     She is temporarily in [facility] for rehab.   Right-handed.   No caffeine use.    Past Surgical History:  Procedure Laterality Date  . APPENDECTOMY    . cervicitis  1964   conization   .  CHOLECYSTECTOMY    . COLON RESECTION  1988   secondary to cancer  . endometrial polyp  05/2000   hyperplasia-laser treatment  . JOINT REPLACEMENT     Rt knee replacement  . POLYPECTOMY  1998   benign X 2  . TOTAL KNEE ARTHROPLASTY  07/2009   right     Family History  Problem Relation Age of Onset  . Kidney failure Mother   . Stroke Father   . Cancer Brother     lung    Allergies  Allergen Reactions  . Fluoxetine Hcl Other (See Comments)     vomiting  . Hydrocodone     Nausea and vomiting   . Shellfish Allergy Nausea And Vomiting    Just feels "sick'  . Diclofenac Sodium Other (See Comments)    : vomiting    Current Outpatient Prescriptions on File Prior to Visit  Medication Sig Dispense Refill  . acetaminophen (TYLENOL) 500 MG tablet Take 1,000 mg by mouth every 4 (four) hours as needed.    Marland Kitchen aspirin 325 MG tablet Take 325 mg by mouth daily.     . calcium carbonate (OS-CAL) 600 MG TABS tablet Take 600 mg by mouth daily with breakfast.     . gabapentin (NEURONTIN)  100 MG capsule Take 1 capsule (100 mg total) by mouth at bedtime. 30 capsule 5  . hydrocortisone (ANUSOL-HC) 2.5 % rectal cream Place 1 application rectally 2 (two) times daily. Stop date 10/13/15. Then 2 times daily as needed    . metoprolol (LOPRESSOR) 50 MG tablet Take 1 tablet (50 mg total) by mouth 2 (two) times daily. 60 tablet 1  . pantoprazole (PROTONIX) 40 MG tablet Take 1 tablet (40 mg total) by mouth daily. 30 tablet 1  . senna-docusate (SENNA-S) 8.6-50 MG tablet TAKE TWO (2) TABLETS BY MOUTH 2 TIMES DAILY FOR CONSTIPATION 120 tablet 0  . sulfamethoxazole-trimethoprim (BACTRIM DS,SEPTRA DS) 800-160 MG tablet Take 1 tablet by mouth 2 (two) times daily. 20 tablet 0  . triamcinolone (KENALOG) 0.025 % cream Apply 1 application topically 2 (two) times daily. To affected area as needed 30 g 1  . vitamin C (ASCORBIC ACID) 500 MG tablet Take 500 mg by mouth daily.      No current facility-administered  medications on file prior to visit.     BP 122/72   Pulse 68   Temp 97.8 F (36.6 C) (Oral)   Wt 190 lb 12.8 oz (86.5 kg)   SpO2 98%   BMI 27.38 kg/m    Objective:   Physical Exam  Constitutional: She is oriented to person, place, and time. She appears well-nourished.  Neck: Neck supple.  Cardiovascular: Normal rate and regular rhythm.   Pulmonary/Chest: Effort normal and breath sounds normal.  Neurological: She is alert and oriented to person, place, and time.  Skin: Skin is warm.  Mild improvement, abscess softer overall. No streaking or signs of worsening infection.          Assessment & Plan:  Cellulitis secondary to Abscess:  Located to left lower buttocks. Compliant to Bactrim DS tablets. Overall abscess softer, some drainage. Not significant improvement. Will treat with IM rocephin 1 gram today. Discussed continued care at home. Discussed s/s of worsening infection with caregiver and family member. Close follow up recommended early next week.  Sheral Flow, NP

## 2016-01-30 NOTE — Progress Notes (Signed)
Pre visit review using our clinic review tool, if applicable. No additional management support is needed unless otherwise documented below in the visit note. 

## 2016-02-02 LAB — WOUND CULTURE
GRAM STAIN: NONE SEEN
Gram Stain: NONE SEEN

## 2016-02-03 ENCOUNTER — Ambulatory Visit (INDEPENDENT_AMBULATORY_CARE_PROVIDER_SITE_OTHER): Payer: Medicare Other | Admitting: Family Medicine

## 2016-02-03 ENCOUNTER — Encounter: Payer: Self-pay | Admitting: Family Medicine

## 2016-02-03 VITALS — BP 102/60 | HR 66 | Temp 98.2°F

## 2016-02-03 DIAGNOSIS — I639 Cerebral infarction, unspecified: Secondary | ICD-10-CM

## 2016-02-03 DIAGNOSIS — L0231 Cutaneous abscess of buttock: Secondary | ICD-10-CM | POA: Diagnosis not present

## 2016-02-03 DIAGNOSIS — L03317 Cellulitis of buttock: Secondary | ICD-10-CM | POA: Diagnosis not present

## 2016-02-03 MED ORDER — SULFAMETHOXAZOLE-TRIMETHOPRIM 800-160 MG PO TABS: 2.0000 | ORAL_TABLET | Freq: Two times a day (BID) | ORAL | 0 refills | Status: DC

## 2016-02-03 NOTE — Progress Notes (Signed)
Pre visit review using our clinic review tool, if applicable. No additional management support is needed unless otherwise documented below in the visit note. 

## 2016-02-03 NOTE — Patient Instructions (Signed)
Your infection is getting better Keep area clean with soap and water  Warm compresses to encourage drainage  Increase the antibiotic (bactrim) to 2 pills twice daily with food  If any problems- let me know  Follow up for a re check in approx a week  If symptoms worsen or you develop a fever please alert me

## 2016-02-03 NOTE — Progress Notes (Signed)
Subjective:    Patient ID: Erin [name], female    DOB: 03-Aug-1928, 82 y.o.   MRN: [id number]  HPI Here for f/u of L buttock cellulitis with abscess  On bactrim DS Her wound cx returned with MRSA  Given rocephin at last visit as well   Starting to feel better  Abscess is draining - especially over the weekend  Some blood and pus  No fever  Much less pain   Patient Active Problem List   Diagnosis Date Noted  . Cellulitis and abscess of buttock 02/03/2016  . Eczema 01/02/2016  . Headache 11/19/2015  . Brain aneurysm 10/22/2015  . Dementia 09/23/2015  . Abnormality of gait 09/23/2015  . Aneurysm ([place])   . CVA (cerebral infarction)   . Goals of care, counseling/discussion   . Slow transit constipation   . Colon cancer screening 07/26/2014  . Elevated TSH 07/26/2014  . Elevated serum creatinine 01/24/2014  . Risk for falls 07/05/2012  . Colon cancer H/O 04/07/2012  . Sacroiliac joint dysfunction 04/01/2012  . Sciatica 03/31/2012  . Low back pain 04/02/2011  . Other screening mammogram 09/30/2010  . Hyperglycemia 09/30/2010  . Post-menopausal 09/30/2010  . ABDOMINAL WALL HERNIA 02/26/2010  . Anemia 08/18/2009  . Essential hypertension 10/31/2007  . Hyperlipidemia LDL goal <100 07/26/2006  . HEMORRHOIDS 07/26/2006  . OSTEOARTHRITIS 07/26/2006  . Osteopenia 07/26/2006  . SLEEP DISORDER 07/26/2006   Past Medical History:  Diagnosis Date  . Diabetes mellitus   . Encephalopathy   . Hyperlipidemia   . Hypertension   . Memory loss   . Obesity   . Osteoarthritis   . Osteoporosis   . Sacroiliac joint dysfunction 04/01/2012  . 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 APPENDECTOMY    . cervicitis  1964   conization   . CHOLECYSTECTOMY    . COLON RESECTION  1988   secondary to cancer  . endometrial polyp  05/2000   hyperplasia-laser treatment  . JOINT REPLACEMENT     Rt knee replacement  .  POLYPECTOMY  1998   benign X 2  . TOTAL KNEE ARTHROPLASTY  07/2009   right    Social History  Substance Use Topics  . Smoking status: Former Smoker    Types: Cigarettes    Quit date: 08/14/1962  . Smokeless tobacco: Never Used     Comment: Quit over 10 years ago  . Alcohol use No   Family History  Problem Relation Age of Onset  . Kidney failure Mother   . Stroke Father   . Cancer Brother     lung   Allergies  Allergen Reactions  . Fluoxetine Hcl Other (See Comments)     vomiting  . Hydrocodone     Nausea and vomiting   . Shellfish Allergy Nausea And Vomiting    Just feels "sick'  . Diclofenac Sodium Other (See Comments)    : vomiting   Current Outpatient Prescriptions on File Prior to Visit  Medication Sig Dispense Refill  . acetaminophen (TYLENOL) 500 MG tablet Take 1,000 mg by mouth every 4 (four) hours as needed.    Marland Kitchen aspirin 325 MG tablet Take 325 mg by mouth daily.     . calcium carbonate (OS-CAL) 600 MG TABS tablet Take 600 mg by mouth daily with breakfast.     . gabapentin (NEURONTIN) 100 MG capsule Take 1 capsule (100 mg total) by mouth at bedtime.  30 capsule 5  . hydrocortisone (ANUSOL-HC) 2.5 % rectal cream Place 1 application rectally 2 (two) times daily. Stop date 10/13/15. Then 2 times daily as needed    . metoprolol (LOPRESSOR) 50 MG tablet Take 1 tablet (50 mg total) by mouth 2 (two) times daily. 60 tablet 1  . pantoprazole (PROTONIX) 40 MG tablet Take 1 tablet (40 mg total) by mouth daily. 30 tablet 1  . senna-docusate (SENNA-S) 8.6-50 MG tablet TAKE TWO (2) TABLETS BY MOUTH 2 TIMES DAILY FOR CONSTIPATION 120 tablet 0  . triamcinolone (KENALOG) 0.025 % cream Apply 1 application topically 2 (two) times daily. To affected area as needed 30 g 1  . vitamin C (ASCORBIC ACID) 500 MG tablet Take 500 mg by mouth daily.      No current facility-administered medications on file prior to visit.     Review of Systems Review of Systems  Constitutional: Negative for  fever, appetite change,  and unexpected weight change.  Eyes: Negative for pain and visual disturbance.  Respiratory: Negative for cough and shortness of breath.   Cardiovascular: Negative for cp or palpitations    Gastrointestinal: Negative for nausea, diarrhea and constipation.  Genitourinary: Negative for urgency and frequency.  Skin: Negative for pallor or rash  pos for abscess on buttock  Neurological: Negative for weakness, light-headedness, numbness and headaches.  Hematological: Negative for adenopathy. Does not bruise/bleed easily.  Psychiatric/Behavioral: Negative for dysphoric mood. The patient is not nervous/anxious.         Objective:   Physical Exam  Constitutional: She appears well-developed and well-nourished. No distress.  Frail appearing elderly female in wheelchair in very good spirits   Eyes: Conjunctivae and EOM are normal. Pupils are equal, round, and reactive to light.  Neck: Normal range of motion. Neck supple.  Cardiovascular: Normal rate and regular rhythm.   Lymphadenopathy:    She has no cervical adenopathy.  Skin: Skin is warm and dry. No rash noted. There is erythema. No pallor.  Abscess on L buttock is decreased in size and softer  Less erythema and less tender  Drains a small amt of pus and some granulation tissue is evident at the opening  Pt is moving around well now   Psychiatric: She has a normal mood and affect.          Assessment & Plan:   Problem List Items Addressed This Visit      Other   Cellulitis and abscess of buttock    With MRSA on wound cx  Continue tx with bactrim ds increasing dose to 2 pills bid with food for 1 week Continue to clean with soap and water  Enc drainage  Warm compresses  Clear to move around more/ do more walking  Alert [place] if worse/fever or no continued improvement  F/u 1 wk        Other Visit Diagnoses    Cellulitis of buttock       Relevant Medications   sulfamethoxazole-trimethoprim (BACTRIM  DS,SEPTRA DS) 800-160 MG tablet

## 2016-02-04 NOTE — Assessment & Plan Note (Signed)
With MRSA on wound cx  Continue tx with bactrim ds increasing dose to 2 pills bid with food for 1 week Continue to clean with soap and water  Enc drainage  Warm compresses  Clear to move around more/ do more walking  Alert [place] if worse/fever or no continued improvement  F/u 1 wk

## 2016-02-05 ENCOUNTER — Other Ambulatory Visit: Payer: Self-pay | Admitting: Family Medicine

## 2016-02-09 ENCOUNTER — Telehealth: Payer: Self-pay

## 2016-02-09 DIAGNOSIS — I671 Cerebral aneurysm, nonruptured: Secondary | ICD-10-CM | POA: Diagnosis not present

## 2016-02-09 MED ORDER — PROMETHAZINE HCL 25 MG PO TABS: 25.0000 mg | ORAL_TABLET | Freq: Three times a day (TID) | ORAL | 0 refills | Status: DC | PRN

## 2016-02-09 NOTE — Telephone Encounter (Signed)
Erin [name] wants to know if there is a med or something you can give or recommend for the nausea, Erin [name] said pt is taking med with food but it's still causing her to be really nauseous, Erin [name] is still going to get the ensure/boost but since she is eating food when she takes her meds she is wondering if something else can be done

## 2016-02-09 NOTE — Telephone Encounter (Signed)
We could try a little phenergan for nausea  I will send to [facility]  This may cause sedation and dry mouth -be aware

## 2016-02-09 NOTE — Telephone Encounter (Signed)
Can she take it with some ensure/boost or other meal supplement like carnation instant breakfast?  She needs some volume in her stomach when she takes it

## 2016-02-09 NOTE — Telephone Encounter (Signed)
V/M left by [name] (DPR signed); pt not eating a lot and when takes abx gets nauseated. Kelly request cb with what to do. Pt has f/u appt on 02/11/16.

## 2016-02-09 NOTE — Telephone Encounter (Signed)
Erin [name] notified of Dr. Marliss Coots comments and that Rx was sent to pharmacy and verbalized understanding

## 2016-02-11 ENCOUNTER — Encounter: Payer: Self-pay | Admitting: Family Medicine

## 2016-02-11 ENCOUNTER — Ambulatory Visit (INDEPENDENT_AMBULATORY_CARE_PROVIDER_SITE_OTHER): Payer: Medicare Other | Admitting: Family Medicine

## 2016-02-11 VITALS — BP 106/58 | HR 55 | Temp 97.9°F

## 2016-02-11 DIAGNOSIS — L0231 Cutaneous abscess of buttock: Secondary | ICD-10-CM | POA: Diagnosis not present

## 2016-02-11 DIAGNOSIS — K5901 Slow transit constipation: Secondary | ICD-10-CM | POA: Diagnosis not present

## 2016-02-11 DIAGNOSIS — I639 Cerebral infarction, unspecified: Secondary | ICD-10-CM

## 2016-02-11 DIAGNOSIS — L03317 Cellulitis of buttock: Secondary | ICD-10-CM | POA: Diagnosis not present

## 2016-02-11 NOTE — Progress Notes (Signed)
Subjective:    Patient ID: Erin [name], female    DOB: [birth year], 82 y.o.   MRN: [id number]  HPI Here for f/u of cellulitis of buttock   The sulfa abx is making her nauseated  She is forcing herself to eat   The cellulitis area is improving It itching  No pain   A little constipated   Patient Active Problem List   Diagnosis Date Noted  . Cellulitis and abscess of buttock 02/03/2016  . Eczema 01/02/2016  . Headache 11/19/2015  . Brain aneurysm 10/22/2015  . Dementia 09/23/2015  . Abnormality of gait 09/23/2015  . Aneurysm ([place])   . CVA (cerebral infarction)   . Goals of care, counseling/discussion   . Slow transit constipation   . Elevated TSH 07/26/2014  . Risk for falls 07/05/2012  . Colon cancer H/O 04/07/2012  . Sacroiliac joint dysfunction 04/01/2012  . Sciatica 03/31/2012  . Low back pain 04/02/2011  . Other screening mammogram 09/30/2010  . Hyperglycemia 09/30/2010  . Post-menopausal 09/30/2010  . ABDOMINAL WALL HERNIA 02/26/2010  . Essential hypertension 10/31/2007  . Hyperlipidemia LDL goal <100 07/26/2006  . HEMORRHOIDS 07/26/2006  . OSTEOARTHRITIS 07/26/2006  . Osteopenia 07/26/2006  . SLEEP DISORDER 07/26/2006   Past Medical History:  Diagnosis Date  . Diabetes mellitus   . Encephalopathy   . Hyperlipidemia   . Hypertension   . Memory loss   . Obesity   . Osteoarthritis   . Osteoporosis   . Sacroiliac joint dysfunction 04/01/2012  . 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 APPENDECTOMY    . cervicitis  1964   conization   . CHOLECYSTECTOMY    . COLON RESECTION  1988   secondary to cancer  . endometrial polyp  05/2000   hyperplasia-laser treatment  . JOINT REPLACEMENT     Rt knee replacement  . POLYPECTOMY  1998   benign X 2  . TOTAL KNEE ARTHROPLASTY  07/2009   right    Social History  Substance Use Topics  . Smoking status: Former Smoker    Types: Cigarettes   Quit date: 08/14/1962  . Smokeless tobacco: Never Used     Comment: Quit over 10 years ago  . Alcohol use No   Family History  Problem Relation Age of Onset  . Kidney failure Mother   . Stroke Father   . Cancer Brother     lung   Allergies  Allergen Reactions  . Fluoxetine Hcl Other (See Comments)     vomiting  . Hydrocodone     Nausea and vomiting   . Shellfish Allergy Nausea And Vomiting    Just feels "sick'  . Diclofenac Sodium Other (See Comments)    : vomiting   Current Outpatient Prescriptions on File Prior to Visit  Medication Sig Dispense Refill  . acetaminophen (TYLENOL) 500 MG tablet Take 1,000 mg by mouth every 4 (four) hours as needed.    Marland Kitchen aspirin 325 MG tablet Take 325 mg by mouth daily.     . calcium carbonate (OS-CAL) 600 MG TABS tablet Take 600 mg by mouth daily with breakfast.     . gabapentin (NEURONTIN) 100 MG capsule Take 1 capsule (100 mg total) by mouth at bedtime. 30 capsule 5  . hydrocortisone (ANUSOL-HC) 2.5 % rectal cream Place 1 application rectally 2 (two) times daily. Stop date 10/13/15. Then 2 times daily as needed    .  metoprolol (LOPRESSOR) 50 MG tablet TAKE TWO (2) TABLETS BY MOUTH 2 TIMES DAILY 120 tablet 0  . pantoprazole (PROTONIX) 40 MG tablet Take 1 tablet (40 mg total) by mouth daily. 30 tablet 1  . senna-docusate (SENNA-S) 8.6-50 MG tablet TAKE TWO (2) TABLETS BY MOUTH 2 TIMES DAILY FOR CONSTIPATION 120 tablet 0  . triamcinolone (KENALOG) 0.025 % cream Apply 1 application topically 2 (two) times daily. To affected area as needed 30 g 1  . vitamin C (ASCORBIC ACID) 500 MG tablet Take 500 mg by mouth daily.      No current facility-administered medications on file prior to visit.     Review of Systems    Review of Systems  Constitutional: Negative for fever, , fatigue and unexpected weight change. pos for dec appetite Eyes: Negative for pain and visual disturbance.  Respiratory: Negative for cough and shortness of breath.     Cardiovascular: Negative for cp or palpitations    Gastrointestinal: Negative for  diarrhea andpos for  constipation. pos for nausea with abx  Genitourinary: Negative for urgency and frequency.  Skin: Negative for pallor or rash  pos for abscess that is much improved/neg for pain or drainage  Neurological: Negative for weakness, light-headedness, numbness and headaches.  Hematological: Negative for adenopathy. Does not bruise/bleed easily.  Psychiatric/Behavioral: Negative for dysphoric mood. The patient is not nervous/anxious.      Objective:   Physical Exam  Constitutional: She appears well-developed and well-nourished. No distress.  Frail appearing elderly female in wheelchair   Eyes: Conjunctivae and EOM are normal. Pupils are equal, round, and reactive to light.  Neck: Normal range of motion. Neck supple.  Cardiovascular: Normal rate.   Abdominal: Soft. Bowel sounds are normal. She exhibits no distension. There is no tenderness.  Neurological: She is alert.  Skin: Skin is warm and dry. No rash noted. No erythema. No pallor.  Former abscess on L inner buttock area is resolved with a 3-4 cm area of hyperpigmentation remaining  No tenderness/induration/fluctuance or drainage           Assessment & Plan:   Problem List Items Addressed This Visit      Digestive   Slow transit constipation    Worse lately with recent dec po intake Continue senna Enc to add miralax daily (she has had it before)- to use as directed mixed with fluid  Update if not starting to improve in a week or if worsening           Other   Cellulitis and abscess of buttock - Primary    Much improved / resolved after another week of double dose bactrim for MRSA Will stop abx (making her nauseated) and also warm compress Will continue to keep clean with soap and water Alert if symptoms return- redness/pain/heat/swelling or drainage        Other Visit Diagnoses    Cellulitis of buttock

## 2016-02-11 NOTE — Patient Instructions (Addendum)
Try miralax over the counter as needed for constipation (in addition to the senna) Also make an effort to drink more liquids  The cellulitis looks better You can stop the antibiotic and the nausea medicine  Try to get back to regular meals as appetite improves and nausea improves

## 2016-02-11 NOTE — Progress Notes (Signed)
Pre visit review using our clinic review tool, if applicable. No additional management support is needed unless otherwise documented below in the visit note. 

## 2016-02-12 NOTE — Assessment & Plan Note (Signed)
Much improved / resolved after another week of double dose bactrim for MRSA Will stop abx (making her nauseated) and also warm compress Will continue to keep clean with soap and water Alert if symptoms return- redness/pain/heat/swelling or drainage

## 2016-02-12 NOTE — Assessment & Plan Note (Signed)
Worse lately with recent dec po intake Continue senna Enc to add miralax daily (she has had it before)- to use as directed mixed with fluid  Update if not starting to improve in a week or if worsening

## 2016-02-19 DIAGNOSIS — H35011 Changes in retinal vascular appearance, right eye: Secondary | ICD-10-CM | POA: Diagnosis not present

## 2016-02-19 DIAGNOSIS — H4311 Vitreous hemorrhage, right eye: Secondary | ICD-10-CM | POA: Diagnosis not present

## 2016-03-08 DIAGNOSIS — Z1211 Encounter for screening for malignant neoplasm of colon: Secondary | ICD-10-CM | POA: Diagnosis not present

## 2016-03-08 DIAGNOSIS — Z1212 Encounter for screening for malignant neoplasm of rectum: Secondary | ICD-10-CM | POA: Diagnosis not present

## 2016-03-08 LAB — COLOGUARD: COLOGUARD: NEGATIVE

## 2016-03-12 ENCOUNTER — Other Ambulatory Visit: Payer: Self-pay | Admitting: *Deleted

## 2016-03-12 NOTE — Telephone Encounter (Signed)
Fax refill request, I can't tell if you have prescribed this med before, last filled on 08/29/15 #30 with 1 refill and that was filled at the hospital, please advise

## 2016-03-12 NOTE — Telephone Encounter (Signed)
Please refill for a year  

## 2016-03-13 MED ORDER — PANTOPRAZOLE SODIUM 40 MG PO TBEC
40.0000 mg | DELAYED_RELEASE_TABLET | Freq: Every day | ORAL | 11 refills | Status: DC
Start: 2016-03-13 — End: 2016-04-22

## 2016-03-13 NOTE — Telephone Encounter (Signed)
done

## 2016-03-24 ENCOUNTER — Encounter: Payer: Self-pay | Admitting: *Deleted

## 2016-04-06 ENCOUNTER — Other Ambulatory Visit: Payer: Self-pay | Admitting: Family Medicine

## 2016-04-22 ENCOUNTER — Ambulatory Visit (INDEPENDENT_AMBULATORY_CARE_PROVIDER_SITE_OTHER): Payer: Medicare Other | Admitting: Family Medicine

## 2016-04-22 ENCOUNTER — Telehealth: Payer: Self-pay

## 2016-04-22 VITALS — BP 120/50 | HR 63

## 2016-04-22 DIAGNOSIS — M79675 Pain in left toe(s): Secondary | ICD-10-CM

## 2016-04-22 DIAGNOSIS — M79674 Pain in right toe(s): Secondary | ICD-10-CM | POA: Diagnosis not present

## 2016-04-22 DIAGNOSIS — L909 Atrophic disorder of skin, unspecified: Secondary | ICD-10-CM

## 2016-04-22 DIAGNOSIS — B351 Tinea unguium: Secondary | ICD-10-CM | POA: Diagnosis not present

## 2016-04-22 DIAGNOSIS — R238 Other skin changes: Secondary | ICD-10-CM

## 2016-04-22 NOTE — Progress Notes (Signed)
Subjective:    Patient ID: Erin [name], female    DOB: 09/06/1928, 83 y.o.   MRN: [id number]  HPI This is an 83 yo female, accompanied by her niece, who presents today with scaling and peeling of feet x several months, seems to be more painful over last couple of weeks. Thinks it started when she was in rehab facility last year where she reports that her socks were never removed and her feet were never washed. Has been applying otc neosporin with analgesic without relief as well as a natural lotion. Pain was primarily on heels, L>R but has now moved up toward top of left ball of foot. No swelling of feet or ankles. Pain has made it difficult for her to walk as much as she would like. She did try to remove some of the loose skin which caused her pain so she stopped. No fever, chills or SOB. No chest pain.  She has a podiatrist at [facility] who she sees for diabetic foot care, has not been in awhile.  She had abscess of buttock 10/17, this has completely resolved.   Past Medical History:  Diagnosis Date  . Diabetes mellitus   . Encephalopathy   . Hyperlipidemia   . Hypertension   . Memory loss   . Obesity   . Osteoarthritis   . Osteoporosis   . Sacroiliac joint dysfunction 04/01/2012  . 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 APPENDECTOMY    . cervicitis  1964   conization   . CHOLECYSTECTOMY    . COLON RESECTION  1988   secondary to cancer  . endometrial polyp  05/2000   hyperplasia-laser treatment  . JOINT REPLACEMENT     Rt knee replacement  . POLYPECTOMY  1998   benign X 2  . TOTAL KNEE ARTHROPLASTY  07/2009   right    Family History  Problem Relation Age of Onset  . Kidney failure Mother   . Stroke Father   . Cancer Brother     lung   Social History  Substance Use Topics  . Smoking status: Former Smoker    Types: Cigarettes    Quit date: 08/14/1962  . Smokeless tobacco: Never Used   Comment: Quit over 10 years ago  . Alcohol use No      Review of Systems Per HPI    Objective:   Physical Exam  Constitutional: She appears well-developed and well-nourished. No distress.  Seated in wheelchair throughout visit.   HENT:  Head: Normocephalic and atraumatic.  Eyes: Conjunctivae are normal.  Cardiovascular: Normal rate, regular rhythm and normal heart sounds.   Pulmonary/Chest: Effort normal and breath sounds normal.  Musculoskeletal: She exhibits no edema.  Neurological: She is alert.  Appropriately conversive.   Skin: Skin is warm and dry. She is not diaphoretic.  Bilateral soles of feet with large area peeling skin. Left ball of foot with approximately 4 cm area tenderness and mild erythema, ? Resolving area of breakdown. Bilateral heels dry with few fissures, no erythema or drainage. Areas of scaling between toes. Thickened, yellow toenails. Distal left great toe tender and red.   Psychiatric: She has a normal mood and affect. Her behavior is normal.  Vitals reviewed.     BP (!) 120/50   Pulse 63   SpO2 98%  Unable to obtain weight    BP Readings from Last 3 Encounters:  04/22/16 (!) 120/50  02/11/16 (!) 106/58  02/03/16 102/60    Assessment & Plan:  1. Pain due to onychomycosis of toenails of both feet - Ambulatory referral to Podiatry  2. Skin breakdown - she has multiple issues and increasing pain, will have her see her podiatrist - in the meantime, she can continue BID Neosporin on areas of cracking, encouraged her to dry feet thoroughly after bathing, apply emollient cream to bottoms of feet twice a day (niece did not wish to use any petroleum based products such as Eucerin or Aquaphor due to disruption in vitamin absorption), avoid peeling skin, but can gently use washcloth to remove dead skin if tolerated - Ambulatory referral to [employer], FNP-BC  [facility]  04/23/2016  8:12 AM

## 2016-04-22 NOTE — Telephone Encounter (Signed)
V/M left; pt was seen earlier today and was to cb with name of med pt using; equate brand maximum strength triple abx and pain relief; OTC.

## 2016-04-22 NOTE — Patient Instructions (Signed)
Continue to apply bacitracin twice a day to cracked areas To peeling areas, apply Eucerin (store brand is fine) twice a day

## 2016-04-27 ENCOUNTER — Encounter: Payer: Self-pay | Admitting: Neurology

## 2016-04-27 ENCOUNTER — Ambulatory Visit (INDEPENDENT_AMBULATORY_CARE_PROVIDER_SITE_OTHER): Payer: Medicare Other | Admitting: Neurology

## 2016-04-27 VITALS — BP 122/58 | HR 61 | Ht 70.0 in | Wt 186.0 lb

## 2016-04-27 DIAGNOSIS — R269 Unspecified abnormalities of gait and mobility: Secondary | ICD-10-CM

## 2016-04-27 DIAGNOSIS — I671 Cerebral aneurysm, nonruptured: Secondary | ICD-10-CM

## 2016-04-27 DIAGNOSIS — I729 Aneurysm of unspecified site: Secondary | ICD-10-CM

## 2016-04-27 MED ORDER — MEMANTINE HCL 10 MG PO TABS: 10.0000 mg | ORAL_TABLET | Freq: Two times a day (BID) | ORAL | 11 refills | Status: DC

## 2016-04-27 NOTE — Progress Notes (Signed)
[facility]  PATIENT: Erin [name] DOB: 1928-12-02  HISTORY OF PRESENT ILLNESS:[name] is a 83 years old right-handed female, she is accompanied by her friend Fraser Din and her niece [name] at today's clinical visit, this is to follow-up her hospital admission in Aug 25 2015, she currently resides at [facility] rehabilitation center. Her primary care physician is Dr.Marne A Tower.  I reviewed and summarized her hospital admission in May 2017, she had a past medical history of diabetes, hypertension, hyperlipidemia, history of colon cancer in 1988, had colectomy, but did not require chemotherapy radiation therapy.  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 83, she was noted to have mild memory trouble since 2012, she lives with her only child who is at age 16, with mental retardation, at baseline, she is still independent living, she has difficulty with names, had quit driving in 1610 after right knee replacement, she had gradually worsening increased gait difficulty, also had left thoracic shingles in 2015, was treated with gabapentin for extended period of time, she was still able to go out with her family for grocery shopping, cook for herself and her son, dress take a bath for herself, but she has become much less active, she spent most of her time which TV, tends to have irregular sleep patterns. With her worsening memory trouble, her niece [name] has to go over her on bill payment around 2016.  in Aug 25 2015, she was noted by her niece, who is also her power of attorney, that she has increased confusion, urinary incontinence episodes, increased unsteady gait,  She was diagnosed  with urinary tract infection, was treated with IV Rocephin, was discharged to current rehabilitation, but she is now back to her baseline yet, she has intermittent increased confusion, continue have urinary urgency, occasionally incontinence, significant gait abnormality. She also complains of mild constipations, chronic low back pain.  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 Her niece [name] reported that her gait is not back to her baseline, worrying about falling.  She needs help with her food.  I have personally reviewed MRI of the cervical spine in July 2017, multilevel cervical degenerative disc disease most severe at C4-5, with mild canal stenosis no evidence of cord compression.  MRA of brain showed evidence of right MCA aneurysm about the size of 8x 4 mm  UPDATE 07/27/2017CM Ms. Wartman, 83 year old female returns for urgent visit due to headaches. She has had symptoms of positional dizziness and unsteadiness and increased headache and visual disturbances. She has a headache  several times a week, sometimes awakening sometimes not. She has a history of hospital admission in May for unsteady gait urinary incontinence and increased confusion. At baseline she has dementia. She continues to be frustrated about her memory loss however she has not started her  medication Namenda. She returns for reevaluation  UPDATE Jan 16th 2018: She is now dealing with her knees, overall she is doing well, has irregular sleep pattern, has good appetite, denies significant pain. We have personally reviewed MRI/A of the brain in May 2017, 6 times 7 mm bilobulated aneurysm arising inferiorly from the supraclinoid segment of the right internal carotid artery, mild non-hemodynamic significant stenosis of distal left vertebral artery, MRI of the cervical spine showed multilevel degenerative disc disease, but no significant canal or foraminal stenosis.  REVIEW OF SYSTEMS: Full 14 system review of systems performed and notable only for those listed, all others are neg:  Memory loss, headache, numbness, incontinence of bladder, blurred vision, runny nose, cold intolerance   ALLERGIES: Allergies  Allergen Reactions  . Fluoxetine Hcl Other (See Comments)     vomiting  . Hydrocodone     Nausea and vomiting   . Shellfish Allergy Nausea And Vomiting    Just feels "sick'  . Diclofenac Sodium Other (See Comments)    : vomiting    HOME MEDICATIONS: Outpatient Medications Prior to Visit  Medication Sig Dispense Refill  . acetaminophen (TYLENOL) 500 MG tablet Take 1,000 mg by mouth every 4 (four) hours as needed.    Marland Kitchen aspirin 325 MG tablet Take 325 mg by mouth daily.     . calcium carbonate (OS-CAL) 600 MG TABS tablet Take 600 mg by mouth daily with breakfast.     . metoprolol (LOPRESSOR) 50 MG tablet TAKE TWO (2) TABLETS BY MOUTH 2 TIMES DAILY 120 tablet 5  . senna-docusate (SENNA-S) 8.6-50 MG tablet TAKE TWO (2) TABLETS BY MOUTH 2 TIMES DAILY FOR CONSTIPATION 120 tablet 0  . triamcinolone (KENALOG) 0.025 % cream Apply 1 application topically 2 (two) times daily. To affected area as needed 30 g 1  . vitamin C (ASCORBIC ACID) 500 MG tablet Take 500 mg by mouth daily.      No facility-administered medications prior to visit.     PAST MEDICAL HISTORY: Past Medical  History:  Diagnosis Date  . Diabetes mellitus   . Encephalopathy   . Hyperlipidemia   . Hypertension   . Memory loss   . Obesity   . Osteoarthritis   . Osteoporosis   . Sacroiliac joint dysfunction 04/01/2012  . 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 APPENDECTOMY    . cervicitis  1964   conization   . CHOLECYSTECTOMY    . COLON RESECTION  1988   secondary to cancer  . endometrial polyp  05/2000   hyperplasia-laser treatment  . JOINT REPLACEMENT     Rt knee replacement  . POLYPECTOMY  1998   benign X 2  . TOTAL KNEE ARTHROPLASTY  07/2009   right     FAMILY HISTORY: Family History  Problem Relation Age of Onset  . Kidney failure Mother   . Stroke Father   . Cancer Brother     lung    SOCIAL HISTORY: Social History   Social History  . Marital status: Widowed    Spouse name: N/A  . Number of children: 1  . Years of education: 2  yrs coll   Occupational History  . Retired    Social History Main Topics  . Smoking status: Former Smoker    Types: Cigarettes    Quit date: 08/14/1962  . Smokeless tobacco: Never Used     Comment: Quit over 10 years ago  . Alcohol use No  . Drug use: No  . Sexual activity: Not on file   Other Topics Concern  . Not on file   Social History Narrative   Lives with son who is mentally impaired.     She is temporarily in [facility] for rehab.   Right-handed.   No caffeine use.     PHYSICAL EXAM  Vitals:   11/06/15 1407  BP: 116/70 lying 120/70 seated, 128/66 standing  Pulse: 68  Weight: 200 lb 9.6 oz (91 kg)  Height: 5' 10"  (1.778 m)   Body mass index is 26.69 kg/m.  PHYSICAL EXAMNIATION:  Gen: NAD, conversant, well nourised, obese, well groomed                     Cardiovascular: Regular rate rhythm, no peripheral edema, warm, nontender. Eyes: Conjunctivae clear without exudates or hemorrhage Neck: Supple, no carotid  bruits. Pulmonary: Clear to auscultation bilaterally   NEUROLOGICAL EXAM:  MENTAL STATUS: Speech:    Speech is normal; fluent and spontaneous with normal comprehension.  Cognition: Mini-Mental Status Examination 28/30, animal naming 9,     Orientation to time, place and person     Recent and remote memory: Street missed 2 out of 3 recalls     Normal Attention span and concentration     Normal Language, naming, repeating,spontaneous speech     Fund of knowledge   CRANIAL NERVES: CN II: Visual fields are full to confrontation. Fundoscopic exam is normal with sharp discs and no vascular changes. Pupils are round equal and briskly reactive to light. CN III, IV, VI: extraocular movement are normal. No ptosis. CN V: Facial sensation is intact to pinprick in all 3 divisions bilaterally. Corneal responses are intact.  CN VII: Face is symmetric with normal eye closure and smile. CN VIII: Hearing is normal to rubbing fingers CN IX, X: Palate elevates symmetrically. Phonation is normal. CN XI: Head turning and shoulder shrug are intact CN XII: Tongue is midline with normal movements and no atrophy.  MOTOR: There is no pronator drift of out-stretched arms. Muscle bulk and tone are normal. Muscle strength is normal.  REFLEXES: Reflexes are 2+ and symmetric at the biceps, triceps, knees, and ankles. Plantar responses are flexor.  SENSORY: Intact to light touch, pinprick, positional and vibratory sensation are intact in fingers and toes.  COORDINATION: Rapid alternating movements and fine finger movements are intact. There is no dysmetria on finger-to-nose and heel-knee-shin.    GAIT/STANCE: She pushing on chair arm to get up from seated position, cautious, mildly unsteady   DIAGNOSTIC DATA (LABS, IMAGING, TESTING) - I reviewed patient records, labs, notes, testing and imaging myself where available.  Lab Results  Component Value Date   WBC 8.9 09/03/2015   HGB 14.2 09/03/2015   HCT  44 09/03/2015   MCV 86.6 08/28/2015   PLT 212 09/03/2015      Component Value Date/Time   NA 138 01/02/2016 1657   NA 138 09/03/2015   K 4.1 01/02/2016 1657   CL 104 01/02/2016 1657   CO2 21 01/02/2016 1657   GLUCOSE 113 (H) 01/02/2016 1657   BUN 13 01/02/2016 1657   BUN 17  09/03/2015   CREATININE 1.18 (H) 01/02/2016 1657   CALCIUM 10.0 01/02/2016 1657   PROT 7.3 01/02/2016 1657   ALBUMIN 3.9 01/02/2016 1657   AST 15 01/02/2016 1657   ALT 4 (L) 01/02/2016 1657   ALKPHOS 51 01/02/2016 1657   BILITOT 0.4 01/02/2016 1657   GFRNONAA 51 (L) 08/29/2015 0547   GFRAA 59 (L) 08/29/2015 0547   Lab Results  Component Value Date   CHOL 128 08/26/2015   HDL 42 08/26/2015   LDLCALC 69 08/26/2015   LDLDIRECT 39.0 07/23/2015   TRIG 86 08/26/2015   CHOLHDL 3.0 08/26/2015   Lab Results  Component Value Date   HGBA1C 5.7 (H) 01/02/2016   Lab Results  Component Value Date   [id number] 081 09/23/2015   Lab Results  Component Value Date   TSH 1.216 08/25/2015    ASSESSMENT AND PLAN:  Mild cognitive impairment:  Most consistent with central nervous system degenerative disorder such as Alzheimer's dementia, laboratory studies were normal. History of acute encephalopathy May 2017 due to UTI      Start Namenda 36m bid     Worsening gait abnormality Likely multifactorial, deconditioning, supratentorium small vessel disease, No significant evidence of cervical myelopathy   Headaches :  Has much improved       YMarcial Pacas M.D. Ph.D.  [facility] [address] [id number]: [phone]:      [phone]

## 2016-04-27 NOTE — Patient Instructions (Signed)
Melatonin

## 2016-05-05 DIAGNOSIS — L2389 Allergic contact dermatitis due to other agents: Secondary | ICD-10-CM | POA: Diagnosis not present

## 2016-05-05 DIAGNOSIS — B353 Tinea pedis: Secondary | ICD-10-CM | POA: Diagnosis not present

## 2016-05-07 ENCOUNTER — Telehealth: Payer: Self-pay

## 2016-05-07 NOTE — Telephone Encounter (Signed)
Reiska pts niece left v/m requesting cb about certification of handicap placard. Do not see DPR signed for Reiska.

## 2016-06-25 ENCOUNTER — Telehealth: Payer: Self-pay

## 2016-06-25 NOTE — Telephone Encounter (Signed)
Pt's niece [name] called requesting medication to be sent in as pt is having difficulty sleeping at night--- she is aware that Dr Glori Bickers is out of the office and request will have to be forwarded to another physician in the office....please advise

## 2016-06-25 NOTE — Telephone Encounter (Signed)
plz call - has she tried anything over the counter yet? If not, recommend start melatonin 5-6mg  at bedtime to help with sleep.  Also recommend try to avoid daytime naps, get plenty of light during the day, and have calm quiet dark environment for sleep at night.  Let me know if she's tried melatonin yet.

## 2016-06-28 NOTE — Telephone Encounter (Signed)
Pt's daughter knows she has tried melatonin but she isn't sure what dose and if she is taking the pills vs tea, pt is going to check with her sister who helps with pt care and see if she is taking at least 5mg  of melatonin, if not they will try that dose but if she checks and pt is already taking 5-6 mg of melatonin they will call back and let us know

## 2016-06-30 NOTE — Telephone Encounter (Signed)
Avoid caffeine  Get her up and moving during the day- sedentary lifestyle can lead to problems sleeping  Can try 25 mg of plain benadryl otc about 1 hour before bed to see if this helps her fall asleep- if it causes side effects or change in mental status stop it   Keep me posted

## 2016-06-30 NOTE — Telephone Encounter (Signed)
Erin [name] notified of Dr. Marliss Coots instructions and verbalized understanding. She will try the benadryl OTC and keep [place] updated

## 2016-06-30 NOTE — Telephone Encounter (Signed)
Erin [name] called back she said pt has been taking 10 mg of Melatonin and it's not helping. Pt's family gets her up in the morning and tries to limit naps and keep the windows opened to bring sunlight in the rooms. They don't let her go back to her room until bedtime so she wont lay back down. She isn't as active with exercise but she is up most of the day. They also keep her room dark and quiet when it's time for bed but nothing is helping pt is having a hard time going to sleep and staying a sleep too

## 2016-07-19 ENCOUNTER — Telehealth: Payer: Self-pay

## 2016-07-19 NOTE — Telephone Encounter (Signed)
Thanks- I will watch for notes. Hard to tell if it is related to constipation from history alone.

## 2016-07-19 NOTE — Telephone Encounter (Signed)
[name] DPR signed said that pt told her she was having bad abd pain. [name] not sure if upper or lower abd pain; [name] wonders if could be related to not have daily BM or not. Pt usually has BM q3 days. No available appts at [facility], [place] or [place] and [name] will take pt to UC. FYI to Dr Glori Bickers.

## 2016-07-23 NOTE — Telephone Encounter (Signed)
Done and in IN box 

## 2016-07-23 NOTE — Telephone Encounter (Signed)
I spoke with Erin [name] and she said it was not urgent but would like a handicap placard form filled out. Form on Dr Alba Cory shelf.

## 2016-07-26 NOTE — Telephone Encounter (Signed)
Thanks- just want to make sure she is feeling better

## 2016-07-26 NOTE — Telephone Encounter (Signed)
Spoke to [place] by telephone and was advised that she got a call back Friday evening from Urgent Care telling her that patient did have a UTI and she is on medication for that. Erin [name] stated that patient is feeling much better and is no longer constipated. Ambrose Pancoast if patient does not continue to improve to let Dr. Glori Bickers know and she agreed.

## 2016-07-26 NOTE — Telephone Encounter (Signed)
Pt caregiver, Melonie [place], returned a call from our office- msg said to call med asst but not sure who called. She took pt to urgent care and she was treated for UTI. She said she is avail if a cb is needed.

## 2016-07-26 NOTE — Telephone Encounter (Signed)
Kelly notified by telephone that form is up front ready for pickup.

## 2016-07-26 NOTE — Telephone Encounter (Signed)
Left message on voicemail for Erin [name] to call back. Form left up front for pickup.

## 2016-07-30 ENCOUNTER — Ambulatory Visit (INDEPENDENT_AMBULATORY_CARE_PROVIDER_SITE_OTHER): Payer: Medicare Other

## 2016-07-30 VITALS — BP 120/70 | HR 54 | Temp 97.8°F | Ht 67.5 in | Wt 195.2 lb

## 2016-07-30 DIAGNOSIS — R7309 Other abnormal glucose: Secondary | ICD-10-CM | POA: Diagnosis not present

## 2016-07-30 DIAGNOSIS — E785 Hyperlipidemia, unspecified: Secondary | ICD-10-CM | POA: Diagnosis not present

## 2016-07-30 DIAGNOSIS — I10 Essential (primary) hypertension: Secondary | ICD-10-CM | POA: Diagnosis not present

## 2016-07-30 DIAGNOSIS — Z0000 Encounter for general adult medical examination without abnormal findings: Secondary | ICD-10-CM | POA: Diagnosis not present

## 2016-07-30 LAB — COMPREHENSIVE METABOLIC PANEL
ALK PHOS: 55 U/L (ref 39–117)
ALT: 8 U/L (ref 0–35)
AST: 18 U/L (ref 0–37)
Albumin: 3.8 g/dL (ref 3.5–5.2)
BILIRUBIN TOTAL: 0.4 mg/dL (ref 0.2–1.2)
BUN: 20 mg/dL (ref 6–23)
CO2: 26 meq/L (ref 19–32)
CREATININE: 1.06 mg/dL (ref 0.40–1.20)
Calcium: 9.9 mg/dL (ref 8.4–10.5)
Chloride: 104 mEq/L (ref 96–112)
GFR: 63.2 mL/min (ref >60.00)
GLUCOSE: 101 mg/dL — AB (ref 70–99)
Potassium: 4 mEq/L (ref 3.5–5.1)
Sodium: 136 mEq/L (ref 135–145)
TOTAL PROTEIN: 7.4 g/dL (ref 6.0–8.3)

## 2016-07-30 LAB — CBC WITH DIFFERENTIAL/PLATELET
BASOS PCT: 1.2 % (ref 0.0–3.0)
Basophils Absolute: 0.1 10*3/uL (ref 0.0–0.1)
EOS PCT: 2 % (ref 0.0–5.0)
Eosinophils Absolute: 0.1 10*3/uL (ref 0.0–0.7)
HCT: 34.3 % — ABNORMAL LOW (ref 36.0–46.0)
Hemoglobin: 11.1 g/dL — ABNORMAL LOW (ref 12.0–15.0)
LYMPHS ABS: 1.9 10*3/uL (ref 0.7–4.0)
Lymphocytes Relative: 31.6 % (ref 12.0–46.0)
MCHC: 32.4 g/dL (ref 30.0–36.0)
MCV: 86.9 fl (ref 78.0–100.0)
Monocytes Absolute: 0.5 10*3/uL (ref 0.1–1.0)
Monocytes Relative: 8.2 % (ref 3.0–12.0)
NEUTROS PCT: 57 % (ref 43.0–77.0)
Neutro Abs: 3.4 10*3/uL (ref 1.4–7.7)
Platelets: 183 10*3/uL (ref 150.0–400.0)
RBC: 3.95 Mil/uL (ref 3.87–5.11)
RDW: 15.3 % (ref 11.5–15.5)
WBC: 6 10*3/uL (ref 4.0–10.5)

## 2016-07-30 LAB — MICROALBUMIN / CREATININE URINE RATIO
Creatinine,U: 45.5 mg/dL
Microalb Creat Ratio: 3.1 mg/g (ref 0.0–30.0)
Microalb, Ur: 1.4 mg/dL (ref 0.0–1.9)

## 2016-07-30 LAB — HEMOGLOBIN A1C: Hgb A1c MFr Bld: 6.3 % (ref 4.6–6.5)

## 2016-07-30 LAB — LDL CHOLESTEROL, DIRECT: LDL DIRECT: 58 mg/dL

## 2016-07-30 LAB — LIPID PANEL
CHOL/HDL RATIO: 4
Cholesterol: 185 mg/dL (ref 0–200)
HDL: 43.8 mg/dL (ref >39.00)
NonHDL: 141.24
Triglycerides: 216 mg/dL — ABNORMAL HIGH (ref 0.0–149.0)
VLDL: 43.2 mg/dL — AB (ref 0.0–40.0)

## 2016-07-30 NOTE — Progress Notes (Signed)
Subjective:   Erin [name] is a 83 y.o. female who presents for Medicare Annual (Subsequent) preventive examination.  Review of Systems:  N/A Cardiac Risk Factors include: advanced age (>9men, >47 women);hypertension;dyslipidemia     Objective:     Vitals: BP 120/70 (BP Location: Right Arm, Patient Position: Sitting, Cuff Size: Normal)   Pulse (!) 54   Temp 97.8 F (36.6 C) (Oral)   Ht 5' 7.5" (1.715 m) Comment: shoes  Wt 195 lb 4 oz (88.6 kg)   SpO2 98%   BMI 30.13 kg/m   Body mass index is 30.13 kg/m.   Tobacco History  Smoking Status  . Former Smoker  . Types: Cigarettes  . Quit date: 08/14/1962  Smokeless Tobacco  . Never Used    Comment: Quit over 10 years ago     Counseling given: No   Past Medical History:  Diagnosis Date  . Diabetes mellitus   . Encephalopathy   . Hyperlipidemia   . Hypertension   . Memory loss   . Obesity   . Osteoarthritis   . Osteoporosis   . Sacroiliac joint dysfunction 04/01/2012  . 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 APPENDECTOMY    . cervicitis  1964   conization   . CHOLECYSTECTOMY    . COLON RESECTION  1988   secondary to cancer  . endometrial polyp  05/2000   hyperplasia-laser treatment  . JOINT REPLACEMENT     Rt knee replacement  . POLYPECTOMY  1998   benign X 2  . TOTAL KNEE ARTHROPLASTY  07/2009   right    Family History  Problem Relation Age of Onset  . Kidney failure Mother   . Stroke Father   . Cancer Brother     lung   History  Sexual Activity  . Sexual activity: Not on file    Outpatient Encounter Prescriptions as of 07/30/2016  Medication Sig  . acetaminophen (TYLENOL) 500 MG tablet Take 1,000 mg by mouth every 4 (four) hours as needed.  Marland Kitchen aspirin 325 MG tablet Take 325 mg by mouth daily.   . calcium carbonate (OS-CAL) 600 MG TABS tablet Take 600 mg by mouth daily with breakfast.   . memantine (NAMENDA) 10 MG tablet Take  1 tablet (10 mg total) by mouth 2 (two) times daily.  . metoprolol (LOPRESSOR) 50 MG tablet TAKE TWO (2) TABLETS BY MOUTH 2 TIMES DAILY  . senna-docusate (SENNA-S) 8.6-50 MG tablet TAKE TWO (2) TABLETS BY MOUTH 2 TIMES DAILY FOR CONSTIPATION  . triamcinolone (KENALOG) 0.025 % cream Apply 1 application topically 2 (two) times daily. To affected area as needed  . vitamin C (ASCORBIC ACID) 500 MG tablet Take 500 mg by mouth daily.    No facility-administered encounter medications on file as of 07/30/2016.     Activities of Daily Living In your present state of health, do you have any difficulty performing the following activities: 07/30/2016  Hearing? N  Vision? N  Difficulty concentrating or making decisions? Y  Walking or climbing stairs? Y  Dressing or bathing? N  Doing errands, shopping? Y  Preparing Food and eating ? N  Using the Toilet? N  In the past six months, have you accidently leaked urine? N  Do you have problems with loss of bowel control? N  Managing your Medications? N  Managing your Finances? N  Housekeeping or managing your Housekeeping? N  Some recent  data might be hidden    Patient Care Team: Abner Greenspan, MD as PCP - General (Family Medicine)    Assessment:     Hearing Screening   125Hz  250Hz  500Hz  1000Hz  2000Hz  3000Hz  4000Hz  6000Hz  8000Hz   Right ear:   40 40 40  40    Left ear:   40 40 40  0    Vision Screening Comments: Last vision exam in Nov 2017   Exercise Activities and Dietary recommendations Current Exercise Habits: The patient does not participate in regular exercise at present, Exercise limited by: None identified  Goals    . Increase physical activity          Starting 07/31/16, I will attempt to do at least 15 min of chair exercises daily.       Fall Risk Fall Risk  07/30/2016 07/30/2015 07/26/2014 07/23/2013 07/05/2012  Falls in the past year? No Yes Yes No Yes  Number falls in past yr: - 1 2 or more - 1  Injury with Fall? - No No - -    Risk for fall due to : - - - - Impaired balance/gait;Impaired mobility;Medication side effect   Depression Screen PHQ 2/9 Scores 07/30/2016 07/30/2015 07/26/2014 07/23/2013  PHQ - 2 Score 0 0 0 0     Cognitive Function MMSE - Mini Mental State Exam 07/30/2016 04/27/2016 10/22/2015 09/23/2015  Not completed: Unable to complete - - -  Orientation to time - 5 3 1   Orientation to Place - 5 5 4   Registration - 3 3 3   Attention/ Calculation - 5 5 2   Recall - 1 0 0  Language- name 2 objects - 2 2 2   Language- repeat - 1 1 1   Language- follow 3 step command - 3 3 3   Language- read & follow direction - 1 1 1   Write a sentence - 1 1 1   Copy design - 1 1 1   Total score - 28 25 19   DX: unspecified dementia      Immunization History  Administered Date(s) Administered  . Influenza Split 04/02/2011, 01/07/2012  . Influenza Whole 05/20/2009, 03/23/2010  . Influenza,inj,Quad PF,36+ Mos 12/08/2012, 01/23/2014, 01/27/2015, 01/02/2016  . PPD Test 08/29/2015  . Pneumococcal Conjugate-13 07/26/2014  . Pneumococcal Polysaccharide-23 12/11/1996, 03/28/2007  . Td 12/11/1996, 03/28/2007   Screening Tests Health Maintenance  Topic Date Due  . MAMMOGRAM  05/14/2020 (Originally 11/10/2013)  . INFLUENZA VACCINE  11/10/2016  . FOOT EXAM  01/01/2017  . HEMOGLOBIN A1C  01/29/2017  . OPHTHALMOLOGY EXAM  02/10/2017  . TETANUS/TDAP  03/27/2017  . URINE MICROALBUMIN  07/30/2017  . DEXA SCAN  Completed  . PNA vac Low Risk Adult  Completed      Plan:     I have personally reviewed and addressed the Medicare Annual Wellness questionnaire and have noted the following in the patient's chart:  A. Medical and social history B. Use of alcohol, tobacco or illicit drugs  C. Current medications and supplements D. Functional ability and status E.  Nutritional status F.  Physical activity G. Advance directives H. List of other physicians I.  Hospitalizations, surgeries, and ER visits in previous 12 months J.   [place] to include hearing, vision, cognitive, depression L. Referrals and appointments - none  In addition, I have reviewed and discussed with patient certain preventive protocols, quality metrics, and best practice recommendations. A written personalized care plan for preventive services as well as general preventive health recommendations were provided to patient.  See attached scanned questionnaire for additional information.   Signed,   Lindell Noe, MHA, BS, LPN Health Coach

## 2016-07-30 NOTE — Progress Notes (Signed)
PCP notes:   Health maintenance:  A1C - completed Microalbumin - completed  Abnormal screenings:   Hearing - failed  Patient concerns:   Per caregiver [name], pt has dry patches to buttocks. She has requested PCP assess at next appt.  Nurse concerns:  None  Next PCP appt:   08/06/16 @ 1430  I reviewed health advisor's note, was available for consultation, and agree with documentation and plan. [name] MD

## 2016-07-30 NOTE — Progress Notes (Signed)
Pre visit review using our clinic review tool, if applicable. No additional management support is needed unless otherwise documented below in the visit note. 

## 2016-07-30 NOTE — Patient Instructions (Signed)
Ms. Chivers , Thank you for taking time to come for your Medicare Wellness Visit. I appreciate your ongoing commitment to your health goals. Please review the following plan we discussed and let me know if I can assist you in the future.   These are the goals we discussed: Goals    . Increase physical activity          Starting 07/31/16, I will attempt to do at least 15 min of chair exercises daily.        This is a list of the screening recommended for you and due dates:  Health Maintenance  Topic Date Due  . Mammogram  05/14/2020*  . Flu Shot  11/10/2016  . Complete foot exam   01/01/2017  . Hemoglobin A1C  01/29/2017  . Eye exam for diabetics  02/10/2017  . Tetanus Vaccine  03/27/2017  . Urine Protein Check  07/30/2017  . DEXA scan (bone density measurement)  Completed  . Pneumonia vaccines  Completed  *Topic was postponed. The date shown is not the original due date.   Preventive Care for Adults  A healthy lifestyle and preventive care can promote health and wellness. Preventive health guidelines for adults include the following key practices.  . A routine yearly physical is a good way to check with your health care provider about your health and preventive screening. It is a chance to share any concerns and updates on your health and to receive a thorough exam.  . Visit your dentist for a routine exam and preventive care every 6 months. Brush your teeth twice a day and floss once a day. Good oral hygiene prevents tooth decay and gum disease.  . The frequency of eye exams is based on your age, health, family medical history, use  of contact lenses, and other factors. Follow your health care provider's ecommendations for frequency of eye exams.  . Eat a healthy diet. Foods like vegetables, fruits, whole grains, low-fat dairy products, and lean protein foods contain the nutrients you need without too many calories. Decrease your intake of foods high in solid fats, added sugars,  and salt. Eat the right amount of calories for you. Get information about a proper diet from your health care provider, if necessary.  . Regular physical exercise is one of the most important things you can do for your health. Most adults should get at least 150 minutes of moderate-intensity exercise (any activity that increases your heart rate and causes you to sweat) each week. In addition, most adults need muscle-strengthening exercises on 2 or more days a week.  Silver Sneakers may be a benefit available to you. To determine eligibility, you may visit the website: www.silversneakers.com or contact program at 414-238-1797 Mon-Fri between 8AM-8PM.   . Maintain a healthy weight. The body mass index (BMI) is a screening tool to identify possible weight problems. It provides an estimate of body fat based on height and weight. Your health care provider can find your BMI and can help you achieve or maintain a healthy weight.   For adults 20 years and older: ? A BMI below 18.5 is considered underweight. ? A BMI of 18.5 to 24.9 is normal. ? A BMI of 25 to 29.9 is considered overweight. ? A BMI of 30 and above is considered obese.   . Maintain normal blood lipids and cholesterol levels by exercising and minimizing your intake of saturated fat. Eat a balanced diet with plenty of fruit and vegetables. Blood tests for lipids  and cholesterol should begin at age 32 and be repeated every 5 years. If your lipid or cholesterol levels are high, you are over 50, or you are at high risk for heart disease, you may need your cholesterol levels checked more frequently. Ongoing high lipid and cholesterol levels should be treated with medicines if diet and exercise are not working.  . If you smoke, find out from your health care provider how to quit. If you do not use tobacco, please do not start.  . If you choose to drink alcohol, please do not consume more than 2 drinks per day. One drink is considered to be 12  ounces (355 mL) of beer, 5 ounces (148 mL) of wine, or 1.5 ounces (44 mL) of liquor.  . If you are 57-70 years old, ask your health care provider if you should take aspirin to prevent strokes.  . Use sunscreen. Apply sunscreen liberally and repeatedly throughout the day. You should seek shade when your shadow is shorter than you. Protect yourself by wearing long sleeves, pants, a wide-brimmed hat, and sunglasses year round, whenever you are outdoors.  . Once a month, do a whole body skin exam, using a mirror to look at the skin on your back. Tell your health care provider of new moles, moles that have irregular borders, moles that are larger than a pencil eraser, or moles that have changed in shape or color.

## 2016-08-06 ENCOUNTER — Ambulatory Visit (INDEPENDENT_AMBULATORY_CARE_PROVIDER_SITE_OTHER): Payer: Medicare Other | Admitting: Family Medicine

## 2016-08-06 ENCOUNTER — Encounter: Payer: Self-pay | Admitting: Family Medicine

## 2016-08-06 VITALS — BP 130/60 | HR 57 | Temp 97.8°F | Ht 63.5 in | Wt 193.0 lb

## 2016-08-06 DIAGNOSIS — E785 Hyperlipidemia, unspecified: Secondary | ICD-10-CM | POA: Diagnosis not present

## 2016-08-06 DIAGNOSIS — Z0000 Encounter for general adult medical examination without abnormal findings: Secondary | ICD-10-CM

## 2016-08-06 DIAGNOSIS — D649 Anemia, unspecified: Secondary | ICD-10-CM

## 2016-08-06 DIAGNOSIS — M85859 Other specified disorders of bone density and structure, unspecified thigh: Secondary | ICD-10-CM

## 2016-08-06 DIAGNOSIS — F0390 Unspecified dementia without behavioral disturbance: Secondary | ICD-10-CM

## 2016-08-06 DIAGNOSIS — R739 Hyperglycemia, unspecified: Secondary | ICD-10-CM

## 2016-08-06 DIAGNOSIS — I729 Aneurysm of unspecified site: Secondary | ICD-10-CM

## 2016-08-06 DIAGNOSIS — I10 Essential (primary) hypertension: Secondary | ICD-10-CM | POA: Diagnosis not present

## 2016-08-06 NOTE — Progress Notes (Signed)
Pre visit review using our clinic review tool, if applicable. No additional management support is needed unless otherwise documented below in the visit note. 

## 2016-08-06 NOTE — Progress Notes (Signed)
Subjective:    Patient ID: Erin [name], female    DOB: 11/19/1928, 83 y.o.   MRN: [id number]  HPI Here for health maintenance exam and to review chronic medical problems    Doing pretty well   Wt Readings from Last 3 Encounters:  08/06/16 193 lb (87.5 kg)  07/30/16 195 lb 4 oz (88.6 kg)  04/27/16 186 lb (84.4 kg)  down 2 lb  Eating better / appetite is improved - making sure she gets something for breakfast and also activia for calcium and probiotics Not much exercise  She has mobility and balance issues- has a walker  bmi 33.6  No falls! Happy about that  Has a medic alert necklace (for she and her son)   Has an exercise video   Had amw 4/20 Missed only one hearing tone (does not bother her)  Does turn the TV up loud at times -family is obs that  Mentioned dry patches on buttocks   Mammogram 8/14-cannot tolerate mammogram due to post herpetic neuralgia  Self breast exam - no lumps   Eye exam 11/17 No changes -improved   dexa 4/15 Osteopenia 2% decrease in femoral neck  No falls or fractures in the past year  Loosing ht  She takes her calcium and D  Declines another dexa for now     cologuard test 11/17 negative  Colonoscopy 2010 Personal hx of colon cancer with surgery Tends toward constipation - improved now  Now knows how to keep from getting impacted (had uti also) Now drinking a lot more fluids  Likes smoothe move tea   Zoster imm -interested in shingrix if covered   bp is stable today  No cp or palpitations or headaches or edema  No side effects to medicines  BP Readings from Last 3 Encounters:  08/06/16 140/66  07/30/16 120/70  04/27/16 (!) 122/58     Hx of memory loss/dementia  Memory continues to improve overall- she is pleased  occ has difficulty with expressing  Did well on mini cog during amw   Hx of hyperlipidemia Lab Results  Component Value Date   CHOL 185 07/30/2016   CHOL 128 08/26/2015   CHOL 147 07/23/2015   Lab Results    Component Value Date   HDL 43.80 07/30/2016   HDL 42 08/26/2015   HDL 36.00 (L) 07/23/2015   Lab Results  Component Value Date   LDLCALC 69 08/26/2015   LDLCALC 48 01/20/2015   LDLCALC 53 07/16/2013   Lab Results  Component Value Date   TRIG 216.0 (H) 07/30/2016   TRIG 86 08/26/2015   TRIG 262.0 (H) 07/23/2015   Lab Results  Component Value Date   CHOLHDL 4 07/30/2016   CHOLHDL 3.0 08/26/2015   CHOLHDL 4 07/23/2015   Lab Results  Component Value Date   LDLDIRECT 58.0 07/30/2016   LDLDIRECT 39.0 07/23/2015   LDLDIRECT 38.0 07/19/2014   Hx of hyperglycemia Lab Results  Component Value Date   HGBA1C 6.3 07/30/2016  now eating /was not eating before  This is up from 5.7  Cbc- Hb is down  Lab Results  Component Value Date   WBC 6.0 07/30/2016   HGB 11.1 (L) 07/30/2016   HCT 34.3 (L) 07/30/2016   MCV 86.9 07/30/2016   PLT 183.0 07/30/2016   Hb down from 14 No stool blood or black color  No abd pain or nausea    Lab Results  Component Value Date   TSH 1.216 08/25/2015  Patient Active Problem List   Diagnosis Date Noted  . Eczema 01/02/2016  . Brain aneurysm 10/22/2015  . Dementia 09/23/2015  . Abnormality of gait 09/23/2015  . Aneurysm ([place])   . CVA (cerebral infarction)   . Goals of care, counseling/discussion   . Slow transit constipation   . Risk for falls 07/05/2012  . Colon cancer H/O 04/07/2012  . Sacroiliac joint dysfunction 04/01/2012  . Sciatica 03/31/2012  . Low back pain 04/02/2011  . Other screening mammogram 09/30/2010  . Hyperglycemia 09/30/2010  . Post-menopausal 09/30/2010  . ABDOMINAL WALL HERNIA 02/26/2010  . Mild anemia 08/18/2009  . Essential hypertension 10/31/2007  . Hyperlipidemia LDL goal <100 07/26/2006  . HEMORRHOIDS 07/26/2006  . OSTEOARTHRITIS 07/26/2006  . Osteopenia 07/26/2006  . SLEEP DISORDER 07/26/2006   Past Medical History:  Diagnosis Date  . Diabetes mellitus   . Encephalopathy   . Hyperlipidemia   .  Hypertension   . Memory loss   . Obesity   . Osteoarthritis   . Osteoporosis   . Sacroiliac joint dysfunction 04/01/2012  . 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 APPENDECTOMY    . cervicitis  1964   conization   . CHOLECYSTECTOMY    . COLON RESECTION  1988   secondary to cancer  . endometrial polyp  05/2000   hyperplasia-laser treatment  . JOINT REPLACEMENT     Rt knee replacement  . POLYPECTOMY  1998   benign X 2  . TOTAL KNEE ARTHROPLASTY  07/2009   right    Social History  Substance Use Topics  . Smoking status: Former Smoker    Types: Cigarettes    Quit date: 08/14/1962  . Smokeless tobacco: Never Used     Comment: Quit over 10 years ago  . Alcohol use No   Family History  Problem Relation Age of Onset  . Kidney failure Mother   . Stroke Father   . Cancer Brother     lung   Allergies  Allergen Reactions  . Fluoxetine Hcl Other (See Comments)     vomiting  . Hydrocodone     Nausea and vomiting   . Shellfish Allergy Nausea And Vomiting    Just feels "sick'  . Diclofenac Sodium Other (See Comments)    : vomiting   Current Outpatient Prescriptions on File Prior to Visit  Medication Sig Dispense Refill  . acetaminophen (TYLENOL) 500 MG tablet Take 1,000 mg by mouth every 4 (four) hours as needed.    Marland Kitchen aspirin 325 MG tablet Take 325 mg by mouth daily.     . calcium carbonate (OS-CAL) 600 MG TABS tablet Take 600 mg by mouth daily with breakfast.     . metoprolol (LOPRESSOR) 50 MG tablet TAKE TWO (2) TABLETS BY MOUTH 2 TIMES DAILY 120 tablet 5  . senna-docusate (SENNA-S) 8.6-50 MG tablet TAKE TWO (2) TABLETS BY MOUTH 2 TIMES DAILY FOR CONSTIPATION 120 tablet 0  . triamcinolone (KENALOG) 0.025 % cream Apply 1 application topically 2 (two) times daily. To affected area as needed 30 g 1  . vitamin C (ASCORBIC ACID) 500 MG tablet Take 500 mg by mouth daily.      No current facility-administered  medications on file prior to visit.     Review of Systems Review of Systems  Constitutional: Negative for fever, appetite change, fatigue and unexpected weight change.  Eyes: Negative for pain and visual disturbance.  Respiratory:  Negative for cough and shortness of breath.   Cardiovascular: Negative for cp or palpitations    Gastrointestinal: Negative for nausea, diarrhea and pos for occ constipation.  Genitourinary: Negative for urgency and frequency.  Skin: Negative for pallor or rash  peeling skin on heels has improved  Neurological: Negative for weakness, light-headedness, numbness and headaches.  Hematological: Negative for adenopathy. Does not bruise/bleed easily.  Psychiatric/Behavioral: Negative for dysphoric mood. The patient is not nervous/anxious.  Memory and cognition have improved        Objective:   Physical Exam  Constitutional: She appears well-developed and well-nourished. No distress.  Well appearing   HENT:  Head: Normocephalic and atraumatic.  Right Ear: External ear normal.  Left Ear: External ear normal.  Mouth/Throat: Oropharynx is clear and moist.  Eyes: Conjunctivae and EOM are normal. Pupils are equal, round, and reactive to light. No scleral icterus.  Neck: Normal range of motion. Neck supple. No JVD present. Carotid bruit is not present. No thyromegaly present.  Cardiovascular: Normal rate, regular rhythm, normal heart sounds and intact distal pulses.  Exam reveals no gallop.   Pulmonary/Chest: Effort normal and breath sounds normal. No respiratory distress. She has no wheezes. She has no rales. She exhibits no tenderness.  No crackles  Abdominal: Soft. Bowel sounds are normal. She exhibits no distension, no abdominal bruit and no mass. There is no tenderness.  Baseline abd scars No tenderness  No HSM  Genitourinary: No breast swelling, tenderness, discharge or bleeding.  Genitourinary Comments: Breast exam: No mass, nodules, thickening, tenderness,  bulging, retraction, inflamation, nipple discharge or skin changes noted.  No axillary or clavicular LA.     Pt is still sensitive over area or prior shingles on chest  Musculoskeletal: Normal range of motion. She exhibits no edema or tenderness.  Lymphadenopathy:    She has no cervical adenopathy.  Neurological: She is alert. She has normal reflexes. No cranial nerve deficit. She exhibits normal muscle tone. Coordination normal.  Skin: Skin is warm and dry. No rash noted. No erythema. No pallor.  Brown nevi and some sks noted  Dry flaking skin on heels is improved - but not resolved    Psychiatric: She has a normal mood and affect.  Mentally sharp and talkative           Assessment & Plan:   Problem List Items Addressed This Visit      Cardiovascular and Mediastinum   Aneurysm ([place])    No changes in status  No symptoms       Essential hypertension - Primary    bp in fair control at this time  BP Readings from Last 1 Encounters:  08/06/16 130/60   No changes needed Disc lifstyle change with low sodium diet and exercise  Labs reviewed         Nervous and Auditory   Dementia    This is actually much improved and she is almost back to baseline (per family and pt)  She is mentally sharp today         Musculoskeletal and Integument   Osteopenia    Rev dexa 4/15 No falls or fractures  On ca and D Declines another dexa right now  Disc need for calcium/ vitamin D/ wt bearing exercise and bone density test every 2 y to monitor Disc safety/ fracture risk in detail          Other   Hyperglycemia    Lab Results  Component Value Date  HGBA1C 6.3 07/30/2016   Increased with better appetite and po intake Niece fixes healthy meals  Continue to follow  disc imp of low glycemic diet and wt loss to prevent DM2       Hyperlipidemia LDL goal <100    Disc goals for lipids and reasons to control them Rev labs with pt Rev low sat fat diet in detail  LDL is at goal  -less than 70 Trig up a bit-disc watching carbs and sugar       Mild anemia    No symptoms  Per pt-anemic in the past for unknown reasons  Will watch for stool change or abd pain or other symptoms  Continue to follow

## 2016-08-06 NOTE — Patient Instructions (Addendum)
Try more walking with your walker  Also use your exercise video   The new shingles vaccine is called Shingrix  If you are interested in a shingles/zoster vaccine - call your insurance to check on coverage,( you should not get it within 1 month of other vaccines) , then call us for a prescription  for it to take to a pharmacy that gives the shot , or make a nurse visit to get it here depending on your coverage   You are slightly anemic Please watch for any signs of GI bleeding   Cut back on bread and simple carbohydrates  Follow up in 6 months with labs prior

## 2016-08-08 DIAGNOSIS — Z0000 Encounter for general adult medical examination without abnormal findings: Secondary | ICD-10-CM | POA: Insufficient documentation

## 2016-08-08 NOTE — Assessment & Plan Note (Signed)
No symptoms  Per pt-anemic in the past for unknown reasons  Will watch for stool change or abd pain or other symptoms  Continue to follow

## 2016-08-08 NOTE — Assessment & Plan Note (Signed)
Disc goals for lipids and reasons to control them Rev labs with pt Rev low sat fat diet in detail  LDL is at goal -less than 70 Trig up a bit-disc watching carbs and sugar

## 2016-08-08 NOTE — Assessment & Plan Note (Signed)
bp in fair control at this time  BP Readings from Last 1 Encounters:  08/06/16 130/60   No changes needed Disc lifstyle change with low sodium diet and exercise  Labs reviewed

## 2016-08-08 NOTE — Assessment & Plan Note (Signed)
Rev dexa 4/15 No falls or fractures  On ca and D Declines another dexa right now  Disc need for calcium/ vitamin D/ wt bearing exercise and bone density test every 2 y to monitor Disc safety/ fracture risk in detail

## 2016-08-08 NOTE — Assessment & Plan Note (Signed)
This is actually much improved and she is almost back to baseline (per family and pt)  She is mentally sharp today

## 2016-08-08 NOTE — Assessment & Plan Note (Signed)
Reviewed health habits including diet and exercise and skin cancer prevention Reviewed appropriate screening tests for age  Also reviewed health mt list, fam hx and immunization status , as well as social and family history   Reviewed AMW  Does not desire hearing aide Cannot tolerate mammograms due to post herpetic neuralgia Declines dexa for another year-no falls or fx No further colon cancer screening at her age  She will check into coverage of shingrix  Labs reviewed

## 2016-08-08 NOTE — Assessment & Plan Note (Signed)
Lab Results  Component Value Date   HGBA1C 6.3 07/30/2016   Increased with better appetite and po intake Niece fixes healthy meals  Continue to follow  disc imp of low glycemic diet and wt loss to prevent DM2

## 2016-08-08 NOTE — Assessment & Plan Note (Signed)
No changes in status  No symptoms

## 2016-08-17 ENCOUNTER — Encounter: Admitting: Family Medicine

## 2016-10-27 ENCOUNTER — Telehealth: Payer: Self-pay | Admitting: Family Medicine

## 2016-10-27 NOTE — Telephone Encounter (Signed)
Please schedule her with first available tomorrow- if her condition worsens overnight go to UC or ED

## 2016-10-27 NOTE — Telephone Encounter (Signed)
Yesterday and today pt has been complaining of HA and nausea, niece gave her crackers and soup which helped but daughter is worried about possible UTI, pt's niece wants to know what she should do, please advise

## 2016-10-27 NOTE — Telephone Encounter (Signed)
Appointment scheduled with Dr. Darnell Level 10/28/2016 at 10:15 am per Dr. Glori Bickers.  [name] (niece) is aware of appointment.  She is also aware that if Ms. Ozier's condition worsens overnight, to take her to an UC or ED.

## 2016-10-27 NOTE — Telephone Encounter (Signed)
Pt's niece Guss Bunde)  is requesting a call back to discuss concerns about Nava. Didn't want to be sent to triage.

## 2016-10-28 ENCOUNTER — Ambulatory Visit (INDEPENDENT_AMBULATORY_CARE_PROVIDER_SITE_OTHER): Payer: Medicare Other | Admitting: Family Medicine

## 2016-10-28 ENCOUNTER — Encounter: Payer: Self-pay | Admitting: Family Medicine

## 2016-10-28 VITALS — BP 132/62 | HR 64 | Temp 98.1°F | Wt 187.0 lb

## 2016-10-28 DIAGNOSIS — R8290 Unspecified abnormal findings in urine: Secondary | ICD-10-CM | POA: Diagnosis not present

## 2016-10-28 DIAGNOSIS — R110 Nausea: Secondary | ICD-10-CM | POA: Diagnosis not present

## 2016-10-28 LAB — POC URINALSYSI DIPSTICK (AUTOMATED)
Bilirubin, UA: NEGATIVE
Blood, UA: NEGATIVE
GLUCOSE UA: NEGATIVE
Ketones, UA: NEGATIVE
Nitrite, UA: POSITIVE
Protein, UA: NEGATIVE
Spec Grav, UA: 1.025 (ref 1.010–1.025)
UROBILINOGEN UA: 0.2 U/dL
pH, UA: 6 (ref 5.0–8.0)

## 2016-10-28 LAB — COMPREHENSIVE METABOLIC PANEL
ALK PHOS: 66 U/L (ref 39–117)
ALT: 7 U/L (ref 0–35)
AST: 19 U/L (ref 0–37)
Albumin: 4 g/dL (ref 3.5–5.2)
BILIRUBIN TOTAL: 0.5 mg/dL (ref 0.2–1.2)
BUN: 19 mg/dL (ref 6–23)
CALCIUM: 10.4 mg/dL (ref 8.4–10.5)
CO2: 29 meq/L (ref 19–32)
Chloride: 104 mEq/L (ref 96–112)
Creatinine, Ser: 1.25 mg/dL — ABNORMAL HIGH (ref 0.40–1.20)
GFR: 52.22 mL/min — AB (ref >60.00)
Glucose, Bld: 107 mg/dL — ABNORMAL HIGH (ref 70–99)
POTASSIUM: 4.3 meq/L (ref 3.5–5.1)
Sodium: 139 mEq/L (ref 135–145)
TOTAL PROTEIN: 8 g/dL (ref 6.0–8.3)

## 2016-10-28 LAB — CBC WITH DIFFERENTIAL/PLATELET
BASOS ABS: 0.1 10*3/uL (ref 0.0–0.1)
Basophils Relative: 0.8 % (ref 0.0–3.0)
EOS PCT: 1.7 % (ref 0.0–5.0)
Eosinophils Absolute: 0.1 10*3/uL (ref 0.0–0.7)
HEMATOCRIT: 35.9 % — AB (ref 36.0–46.0)
Hemoglobin: 11.5 g/dL — ABNORMAL LOW (ref 12.0–15.0)
LYMPHS PCT: 23.2 % (ref 12.0–46.0)
Lymphs Abs: 1.5 10*3/uL (ref 0.7–4.0)
MCHC: 32.1 g/dL (ref 30.0–36.0)
MCV: 88.9 fl (ref 78.0–100.0)
MONOS PCT: 10.1 % (ref 3.0–12.0)
Monocytes Absolute: 0.7 10*3/uL (ref 0.1–1.0)
Neutro Abs: 4.2 10*3/uL (ref 1.4–7.7)
Neutrophils Relative %: 64.2 % (ref 43.0–77.0)
Platelets: 176 10*3/uL (ref 150.0–400.0)
RBC: 4.03 Mil/uL (ref 3.87–5.11)
RDW: 15.5 % (ref 11.5–15.5)
WBC: 6.5 10*3/uL (ref 4.0–10.5)

## 2016-10-28 LAB — LIPASE: Lipase: 21 U/L (ref 11.0–59.0)

## 2016-10-28 MED ORDER — CEPHALEXIN 500 MG PO CAPS: 500.0000 mg | ORAL_CAPSULE | Freq: Two times a day (BID) | ORAL | 0 refills | Status: DC

## 2016-10-28 NOTE — Addendum Note (Signed)
Addended by: Modena Nunnery on: 10/28/2016 11:30 AM   Modules accepted: Orders

## 2016-10-28 NOTE — Patient Instructions (Addendum)
Labs today Urinalysis today zofran for nausea today.  Urine with possible infection - treat with keflex antibiotic twice daily for 7 days. Continue to push small sips of fluids.  Let us know if not improving with treatment.

## 2016-10-28 NOTE — Progress Notes (Addendum)
BP 132/62   Pulse 64   Temp 98.1 F (36.7 C) (Oral)   Wt 187 lb (84.8 kg)   SpO2 98%   BMI 32.61 kg/m    CC: headache and nausea Subjective:    Patient ID: Faylene Kurtz, female    DOB: 07-29-1928, 83 y.o.   MRN: [id number]  HPI: SHANNA UN is a 83 y.o. female presenting on 10/28/2016 for Headache and Nausea   2d history of malaise, nausea "sick to my stomach" with dry heaves and mild vomiting. Appetite is down. Also with intermittent headache treating with tylenol. Feels bad right when she wakes up, then symptoms seem to subside in the evenings. Chronic constipation treated with senna-S and smooth move tea. Passing gas, 2 loose bowel movements last night and this morning after tea. Some weakness.   Denies fevers/chills, ear or tooth pain or congestion, ST, coughing, dyspnea, chest pain, abd pain, cramping, diarrhea, blood in stool, dysuria. No dizziness, palpitations.   Usually uses walker - today using wheelchair.   She has had soup and crackers the last 2 days. Drinking ensure and eating activia.  No sick contacts. No recently travel No new foods or restaurants.   Relevant past medical, surgical, family and social history reviewed and updated as indicated. Interim medical history since our last visit reviewed. Allergies and medications reviewed and updated. Outpatient Medications Prior to Visit  Medication Sig Dispense Refill  . acetaminophen (TYLENOL) 500 MG tablet Take 1,000 mg by mouth every 4 (four) hours as needed.    Marland Kitchen aspirin 325 MG tablet Take 325 mg by mouth daily.     . calcium carbonate (OS-CAL) 600 MG TABS tablet Take 600 mg by mouth daily with breakfast.     . metoprolol (LOPRESSOR) 50 MG tablet TAKE TWO (2) TABLETS BY MOUTH 2 TIMES DAILY 120 tablet 5  . senna-docusate (SENNA-S) 8.6-50 MG tablet TAKE TWO (2) TABLETS BY MOUTH 2 TIMES DAILY FOR CONSTIPATION 120 tablet 0  . triamcinolone (KENALOG) 0.025 % cream Apply 1 application topically 2 (two) times daily.  To affected area as needed 30 g 1  . vitamin C (ASCORBIC ACID) 500 MG tablet Take 500 mg by mouth daily.      No facility-administered medications prior to visit.      Per HPI unless specifically indicated in ROS section below Review of Systems     Objective:    BP 132/62   Pulse 64   Temp 98.1 F (36.7 C) (Oral)   Wt 187 lb (84.8 kg)   SpO2 98%   BMI 32.61 kg/m   Wt Readings from Last 3 Encounters:  10/28/16 187 lb (84.8 kg)  08/06/16 193 lb (87.5 kg)  07/30/16 195 lb 4 oz (88.6 kg)    Physical Exam  Constitutional: She appears well-developed and well-nourished. No distress.  HENT:  Mouth/Throat: Oropharynx is clear and moist. No oropharyngeal exudate.  MMM  Eyes: Pupils are equal, round, and reactive to light. Conjunctivae and EOM are normal. No scleral icterus.  Neck: No thyromegaly present.  Cardiovascular: Normal rate, regular rhythm, normal heart sounds and intact distal pulses.   No murmur heard. Pulmonary/Chest: Effort normal and breath sounds normal. No respiratory distress. She has no wheezes. She has no rales.  Abdominal: Soft. Bowel sounds are normal. She exhibits no distension and no mass. There is no hepatosplenomegaly. There is tenderness (mild) in the left lower quadrant. There is no rigidity, no rebound, no guarding, no CVA tenderness and negative  Murphy's sign.  Musculoskeletal: She exhibits no edema.  Lymphadenopathy:    She has no cervical adenopathy.  Skin: Skin is warm and dry. No rash noted.  Psychiatric: She has a normal mood and affect.  Nursing note and vitals reviewed.  Lab Results  Component Value Date   HGBA1C 6.3 07/30/2016    Results for orders placed or performed in visit on 10/28/16  POCT Urinalysis Dipstick (Automated)  Result Value Ref Range   Color, UA yellow    Clarity, UA cloudy    Glucose, UA neg    Bilirubin, UA neg    Ketones, UA neg    Spec Grav, UA 1.025 1.010 - 1.025   Blood, UA neg    pH, UA 6.0 5.0 - 8.0    Protein, UA neg    Urobilinogen, UA 0.2 0.2 or 1.0 E.U./dL   Nitrite, UA pos    Leukocytes, UA Small (1+) (A) Negative       Assessment & Plan:   Problem List Items Addressed This Visit    Nausea without vomiting - Primary    Unclear etiology - as no other UTI sxs however pt and daughter state this is how prior UTI started. UA today with nitrates and LE - will send UCx. Will start empiric treatment with keflex 500mg  (pt has tolerated well).  Will also check further labs.  Discussed supportive care, discussed avoiding bladder irritants, discussed importance of hydration status. Pt and daughter agree with plan. Decline zofran Rx.       Relevant Orders   Comprehensive metabolic panel   CBC with Differential/Platelet   Lipase   Urine Culture    Other Visit Diagnoses    Abnormal urinalysis       Relevant Orders   Urine Culture   POCT Urinalysis Dipstick (Automated) (Completed)       Follow up plan: Return if symptoms worsen or fail to improve.  Ria Bush, MD

## 2016-10-28 NOTE — Assessment & Plan Note (Signed)
Unclear etiology - as no other UTI sxs however pt and daughter state this is how prior UTI started. UA today with nitrates and LE - will send UCx. Will start empiric treatment with keflex 500mg  (pt has tolerated well).  Will also check further labs.  Discussed supportive care, discussed avoiding bladder irritants, discussed importance of hydration status. Pt and daughter agree with plan. Decline zofran Rx.

## 2016-10-31 LAB — URINE CULTURE

## 2017-01-28 ENCOUNTER — Other Ambulatory Visit (INDEPENDENT_AMBULATORY_CARE_PROVIDER_SITE_OTHER): Payer: Medicare Other

## 2017-01-28 DIAGNOSIS — D649 Anemia, unspecified: Secondary | ICD-10-CM

## 2017-01-28 DIAGNOSIS — I10 Essential (primary) hypertension: Secondary | ICD-10-CM | POA: Diagnosis not present

## 2017-01-28 DIAGNOSIS — R739 Hyperglycemia, unspecified: Secondary | ICD-10-CM

## 2017-01-28 DIAGNOSIS — E785 Hyperlipidemia, unspecified: Secondary | ICD-10-CM | POA: Diagnosis not present

## 2017-01-28 LAB — COMPREHENSIVE METABOLIC PANEL
ALBUMIN: 3.7 g/dL (ref 3.5–5.2)
ALT: 9 U/L (ref 0–35)
AST: 19 U/L (ref 0–37)
Alkaline Phosphatase: 57 U/L (ref 39–117)
BUN: 21 mg/dL (ref 6–23)
CALCIUM: 9.6 mg/dL (ref 8.4–10.5)
CHLORIDE: 106 meq/L (ref 96–112)
CO2: 26 meq/L (ref 19–32)
CREATININE: 1.32 mg/dL — AB (ref 0.40–1.20)
GFR: 49.01 mL/min — AB (ref >60.00)
Glucose, Bld: 103 mg/dL — ABNORMAL HIGH (ref 70–99)
POTASSIUM: 4 meq/L (ref 3.5–5.1)
Sodium: 140 mEq/L (ref 135–145)
Total Bilirubin: 0.3 mg/dL (ref 0.2–1.2)
Total Protein: 7.2 g/dL (ref 6.0–8.3)

## 2017-01-28 LAB — CBC WITH DIFFERENTIAL/PLATELET
BASOS PCT: 0.8 % (ref 0.0–3.0)
Basophils Absolute: 0 10*3/uL (ref 0.0–0.1)
EOS ABS: 0.1 10*3/uL (ref 0.0–0.7)
EOS PCT: 1.8 % (ref 0.0–5.0)
HEMATOCRIT: 33.2 % — AB (ref 36.0–46.0)
HEMOGLOBIN: 10.7 g/dL — AB (ref 12.0–15.0)
LYMPHS PCT: 40.6 % (ref 12.0–46.0)
Lymphs Abs: 2.4 10*3/uL (ref 0.7–4.0)
MCHC: 32.3 g/dL (ref 30.0–36.0)
MCV: 88.9 fl (ref 78.0–100.0)
Monocytes Absolute: 0.5 10*3/uL (ref 0.1–1.0)
Monocytes Relative: 8.7 % (ref 3.0–12.0)
Neutro Abs: 2.9 10*3/uL (ref 1.4–7.7)
Neutrophils Relative %: 48.1 % (ref 43.0–77.0)
Platelets: 164 10*3/uL (ref 150.0–400.0)
RBC: 3.74 Mil/uL — ABNORMAL LOW (ref 3.87–5.11)
RDW: 15.9 % — AB (ref 11.5–15.5)
WBC: 6 10*3/uL (ref 4.0–10.5)

## 2017-01-28 LAB — LIPID PANEL
CHOLESTEROL: 167 mg/dL (ref 0–200)
HDL: 41.6 mg/dL (ref >39.00)
LDL Cholesterol: 89 mg/dL (ref 0–99)
NONHDL: 125.06
Total CHOL/HDL Ratio: 4
Triglycerides: 182 mg/dL — ABNORMAL HIGH (ref 0.0–149.0)
VLDL: 36.4 mg/dL (ref 0.0–40.0)

## 2017-01-28 LAB — FERRITIN: FERRITIN: 172.8 ng/mL (ref 10.0–291.0)

## 2017-01-28 LAB — HEMOGLOBIN A1C: HEMOGLOBIN A1C: 6 % (ref 4.6–6.5)

## 2017-02-04 ENCOUNTER — Encounter: Payer: Self-pay | Admitting: Family Medicine

## 2017-02-04 ENCOUNTER — Ambulatory Visit (INDEPENDENT_AMBULATORY_CARE_PROVIDER_SITE_OTHER): Payer: Medicare Other | Admitting: Family Medicine

## 2017-02-04 VITALS — BP 126/68 | HR 61 | Temp 98.2°F | Ht 70.0 in | Wt 195.5 lb

## 2017-02-04 DIAGNOSIS — Z23 Encounter for immunization: Secondary | ICD-10-CM | POA: Diagnosis not present

## 2017-02-04 DIAGNOSIS — R7989 Other specified abnormal findings of blood chemistry: Secondary | ICD-10-CM

## 2017-02-04 DIAGNOSIS — G479 Sleep disorder, unspecified: Secondary | ICD-10-CM | POA: Diagnosis not present

## 2017-02-04 DIAGNOSIS — E785 Hyperlipidemia, unspecified: Secondary | ICD-10-CM | POA: Diagnosis not present

## 2017-02-04 DIAGNOSIS — R8290 Unspecified abnormal findings in urine: Secondary | ICD-10-CM | POA: Diagnosis not present

## 2017-02-04 DIAGNOSIS — D649 Anemia, unspecified: Secondary | ICD-10-CM | POA: Diagnosis not present

## 2017-02-04 DIAGNOSIS — I10 Essential (primary) hypertension: Secondary | ICD-10-CM | POA: Diagnosis not present

## 2017-02-04 DIAGNOSIS — R739 Hyperglycemia, unspecified: Secondary | ICD-10-CM

## 2017-02-04 LAB — POC URINALSYSI DIPSTICK (AUTOMATED)
BILIRUBIN UA: NEGATIVE
GLUCOSE UA: NEGATIVE
KETONES UA: NEGATIVE
Nitrite, UA: NEGATIVE
PH UA: 6 (ref 5.0–8.0)
Spec Grav, UA: 1.015 (ref 1.010–1.025)
Urobilinogen, UA: 0.2 E.U./dL

## 2017-02-04 NOTE — Patient Instructions (Addendum)
Try to limit sweets and refined carbs  Try to get most of your carbohydrates from produce (with the exception of white potatoes)  Eat less bread/pasta/rice/snack foods/cereals/sweets and other items from the middle of the grocery store (processed carbs)   Aim for exercise 5 days per week   Aim for 64 oz of fluid per day -mostly water to help kidney health   Please do the heme cards to look for blood in your stool since anemia is a little worse  Give a urine sample on the way out   Follow up in 6 months

## 2017-02-04 NOTE — Assessment & Plan Note (Signed)
Trouble sleeping Also quite sedentary-this may be the cause  [facility]  Will [facility] closely

## 2017-02-04 NOTE — Progress Notes (Signed)
Subjective:    Patient ID: Erin [name], female    DOB: 04-Dec-1928, 83 y.o.   MRN: [id number]  HPI Here for 6 mo f/u of chronic health problems   Not getting any exercise  [facility]    Wt Readings from Last 3 Encounters:  02/04/17 195 lb 8 oz (88.7 kg)  10/28/16 187 lb (84.8 kg)  08/06/16 193 lb (87.5 kg)  wt is up w/o exercise  Eating small portions  Most of the time avoids sweets 28.05 kg/m   Flu shot today  bp is stable today  No cp or palpitations or headaches or edema  No side effects to medicines  BP Readings from Last 3 Encounters:  02/04/17 126/68  10/28/16 132/62  08/06/16 130/60    . Lab Results  Component Value Date   CREATININE 1.32 (H) 01/28/2017   BUN 21 01/28/2017   NA 140 01/28/2017   K 4.0 01/28/2017   CL 106 01/28/2017   CO2 26 01/28/2017  takes asa 1 daily  No nsaids  Drinks water  -poss not enough  No urinary symptoms  Once there was pink when she wiped  - just that once (? Blood in urine)   Lab Results  Component Value Date   ALT 9 01/28/2017   AST 19 01/28/2017   ALKPHOS 57 01/28/2017   BILITOT 0.3 01/28/2017     Hyperglycemia Lab Results  Component Value Date   HGBA1C 6.0 01/28/2017  improved - eating less in general  Down from 6.3   Anemia  Lab Results  Component Value Date   WBC 6.0 01/28/2017   HGB 10.7 (L) 01/28/2017   HCT 33.2 (L) 01/28/2017   MCV 88.9 01/28/2017   PLT 164.0 01/28/2017   Lab Results  Component Value Date   FERRITIN 172.8 01/28/2017  Hb is down form 11.5   Colonoscopy 2010  cologuard 11/17 nl  H/o colon cancer    Cholesterol  Lab Results  Component Value Date   CHOL 167 01/28/2017   CHOL 185 07/30/2016   CHOL 128 08/26/2015   Lab Results  Component Value Date   HDL 41.60 01/28/2017   HDL 43.80 07/30/2016   HDL 42 08/26/2015   Lab Results  Component Value Date   LDLCALC 89 01/28/2017   LDLCALC 69 08/26/2015   LDLCALC 48 01/20/2015   Lab  Results  Component Value Date   TRIG 182.0 (H) 01/28/2017   TRIG 216.0 (H) 07/30/2016   TRIG 86 08/26/2015   Lab Results  Component Value Date   CHOLHDL 4 01/28/2017   CHOLHDL 4 07/30/2016   CHOLHDL 3.0 08/26/2015   Lab Results  Component Value Date   LDLDIRECT 58.0 07/30/2016   LDLDIRECT 39.0 07/23/2015   LDLDIRECT 38.0 07/19/2014  decent control with diet   Patient Active Problem List   Diagnosis Date Noted  . Routine general medical examination at a health care facility 08/08/2016  . Eczema 01/02/2016  . Brain aneurysm 10/22/2015  . Dementia 09/23/2015  . Abnormality of gait 09/23/2015  . Aneurysm ([place])   . CVA (cerebral infarction)   . Goals of care, counseling/discussion   . Slow transit constipation   . Elevated serum creatinine 01/24/2014  . Risk for falls 07/05/2012  . Colon cancer H/O 04/07/2012  . Sacroiliac joint dysfunction 04/01/2012  . Sciatica 03/31/2012  . Low back pain 04/02/2011  . Other screening mammogram 09/30/2010  . Hyperglycemia 09/30/2010  . Post-menopausal  09/30/2010  . ABDOMINAL WALL HERNIA 02/26/2010  . Mild anemia 08/18/2009  . Essential hypertension 10/31/2007  . Hyperlipidemia LDL goal <100 07/26/2006  . HEMORRHOIDS 07/26/2006  . OSTEOARTHRITIS 07/26/2006  . Osteopenia 07/26/2006  . Disturbance in sleep behavior 07/26/2006   Past Medical History:  Diagnosis Date  . Diabetes mellitus   . Encephalopathy   . Hyperlipidemia   . Hypertension   . Memory loss   . Obesity   . Osteoarthritis   . Osteoporosis   . Sacroiliac joint dysfunction 04/01/2012  . 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 APPENDECTOMY    . cervicitis  1964   conization   . CHOLECYSTECTOMY    . COLON RESECTION  1988   secondary to cancer  . endometrial polyp  05/2000   hyperplasia-laser treatment  . JOINT REPLACEMENT     Rt knee replacement  . POLYPECTOMY  1998   benign X 2  . TOTAL KNEE  ARTHROPLASTY  07/2009   right    Social History  Substance Use Topics  . Smoking status: Former Smoker    Types: Cigarettes    Quit date: 08/14/1962  . Smokeless tobacco: Never Used     Comment: Quit over 10 years ago  . Alcohol use No   Family History  Problem Relation Age of Onset  . Kidney failure Mother   . Stroke Father   . Cancer Brother        lung   Allergies  Allergen Reactions  . Fluoxetine Hcl Other (See Comments)     vomiting  . Hydrocodone     Nausea and vomiting   . Shellfish Allergy Nausea And Vomiting    Just feels "sick'  . Diclofenac Sodium Other (See Comments)    : vomiting   Current Outpatient Prescriptions on File Prior to Visit  Medication Sig Dispense Refill  . acetaminophen (TYLENOL) 500 MG tablet Take 1,000 mg by mouth every 4 (four) hours as needed.    Marland Kitchen aspirin 325 MG tablet Take 325 mg by mouth daily.     . calcium carbonate (OS-CAL) 600 MG TABS tablet Take 600 mg by mouth daily with breakfast.     . senna-docusate (SENNA-S) 8.6-50 MG tablet TAKE TWO (2) TABLETS BY MOUTH 2 TIMES DAILY FOR CONSTIPATION 120 tablet 0  . triamcinolone (KENALOG) 0.025 % cream Apply 1 application topically 2 (two) times daily. To affected area as needed 30 g 1  . vitamin C (ASCORBIC ACID) 500 MG tablet Take 500 mg by mouth daily.      No current facility-administered medications on file prior to visit.     Review of Systems  Constitutional: Positive for fatigue. Negative for activity change, appetite change, fever and unexpected weight change.  HENT: Negative for congestion, ear pain, rhinorrhea, sinus pressure and sore throat.   Eyes: Negative for pain, redness and visual disturbance.  Respiratory: Negative for cough, shortness of breath and wheezing.   Cardiovascular: Negative for chest pain and palpitations.  Gastrointestinal: Negative for abdominal pain, blood in stool, constipation and diarrhea.  Endocrine: Negative for polydipsia and polyuria.  Genitourinary:  Negative for dysuria, frequency and urgency.  Musculoskeletal: Positive for arthralgias. Negative for back pain and myalgias.  Skin: Negative for pallor and rash.  Allergic/Immunologic: Negative for environmental allergies.  Neurological: Negative for dizziness, syncope and headaches.  Hematological: Negative for adenopathy. Does not bruise/bleed easily.  Psychiatric/Behavioral: Positive for sleep disturbance.  Negative for decreased concentration and dysphoric mood. The patient is not nervous/anxious.        Objective:   Physical Exam  Constitutional: She appears well-developed and well-nourished. No distress.  Elderly female sitting on wheelchair   HENT:  Head: Normocephalic and atraumatic.  Mouth/Throat: Oropharynx is clear and moist.  Eyes: Pupils are equal, round, and reactive to light. Conjunctivae and EOM are normal.  Neck: Normal range of motion. Neck supple. No JVD present. Carotid bruit is not present. No thyromegaly present.  Cardiovascular: Normal rate, regular rhythm, normal heart sounds and intact distal pulses.  Exam reveals no gallop.   Pulmonary/Chest: Effort normal and breath sounds normal. No respiratory distress. She has no wheezes. She has no rales.  No crackles  Good air exch  Abdominal: Soft. Bowel sounds are normal. She exhibits no distension, no abdominal bruit and no mass. There is no hepatosplenomegaly. There is no tenderness. There is no rigidity, no rebound, no guarding and no CVA tenderness.  Baseline scars No M  Musculoskeletal: She exhibits no edema.  Lymphadenopathy:    She has no cervical adenopathy.  Neurological: She is alert. She has normal reflexes. No cranial nerve deficit. She exhibits normal muscle tone. Coordination normal.  Skin: Skin is warm and dry. No rash noted.  Psychiatric: She has a normal mood and affect.  Mentally sharp today          Assessment & Plan:   Problem List Items Addressed This Visit      Cardiovascular and  Mediastinum   Essential hypertension - Primary    bp in fair control at this time  BP Readings from Last 1 Encounters:  02/04/17 126/68   No changes needed Disc lifstyle change with low sodium diet and exercise  Labs reviewed  Elevated cr-watching  Continue lopressor       Relevant Medications   metoprolol tartrate (LOPRESSOR) 50 MG tablet     Other   Disturbance in sleep behavior (Chronic)    Trouble sleeping Also quite sedentary-this may be the cause  [facility]  Will [facility] closely        Elevated serum creatinine    Lab Results  Component Value Date   CREATININE 1.32 (H) 01/28/2017   Also some anemia with nl ferritin Not on nephrotoxic meds Check ua today  Disc imp of fluids-will aim for 64 oz daily  This may be age or chronic disease related       Hyperglycemia    Lab Results  Component Value Date   HGBA1C 6.0 01/28/2017   Eating smaller portions disc imp of low glycemic diet and wt loss to prevent DM2       Hyperlipidemia LDL goal <100    Disc goals for lipids and reasons to control them Rev labs with pt Rev low sat fat diet in detail LDL is in 80s        Relevant Medications   metoprolol tartrate (LOPRESSOR) 50 MG tablet   Mild anemia    Hb is down to 10.7  H/o colon cancer  cologuard neg in 2017 however  No symptoms except fatigue (? Rel)  Nl ferritin level  Given 3 heme stool cards today to complete ? If rel to chronic dz or declining renal fxn      Relevant Orders   Hemoccult Cards (X3 cards)    Other Visit Diagnoses    Need for influenza vaccination  Relevant Orders   Flu Vaccine QUAD 6+ mos PF IM (Fluarix Quad PF) (Completed)

## 2017-02-04 NOTE — Assessment & Plan Note (Signed)
Disc goals for lipids and reasons to control them Rev labs with pt Rev low sat fat diet in detail LDL is in 80s

## 2017-02-04 NOTE — Assessment & Plan Note (Signed)
bp in fair control at this time  BP Readings from Last 1 Encounters:  02/04/17 126/68   No changes needed Disc lifstyle change with low sodium diet and exercise  Labs reviewed  Elevated cr-watching  Continue lopressor

## 2017-02-04 NOTE — Assessment & Plan Note (Signed)
Hb is down to 10.7  H/o colon cancer  cologuard neg in 2017 however  No symptoms except fatigue (? Rel)  Nl ferritin level  Given 3 heme stool cards today to complete ? If rel to chronic dz or declining renal fxn

## 2017-02-04 NOTE — Assessment & Plan Note (Signed)
Lab Results  Component Value Date   HGBA1C 6.0 01/28/2017   Eating smaller portions disc imp of low glycemic diet and wt loss to prevent DM2

## 2017-02-04 NOTE — Assessment & Plan Note (Signed)
Lab Results  Component Value Date   CREATININE 1.32 (H) 01/28/2017   Also some anemia with nl ferritin Not on nephrotoxic meds Check ua today  Disc imp of fluids-will aim for 64 oz daily  This may be age or chronic disease related

## 2017-02-07 ENCOUNTER — Telehealth: Payer: Self-pay | Admitting: Family Medicine

## 2017-02-07 LAB — URINE CULTURE
MICRO NUMBER: 81202753
SPECIMEN QUALITY:: ADEQUATE

## 2017-02-07 MED ORDER — CEPHALEXIN 250 MG PO CAPS
250.0000 mg | ORAL_CAPSULE | Freq: Two times a day (BID) | ORAL | 0 refills | Status: DC
Start: 2017-02-07 — End: 2017-03-11

## 2017-02-07 NOTE — Telephone Encounter (Signed)
Left voicemail letting Guss Bunde (niece) know Dr. Marliss Coots comments and that abx was sent to [facility] and requested [name] to call back and schedule lab appt in 2 weeks (CRM created)

## 2017-02-07 NOTE — Telephone Encounter (Signed)
Please let pt know that she does have a uti (that is likely what caused her kidney number to go up   I am sending keflex to [facility] Repeat ucx in 2 weeks with renal profile for elevated cr and uti   Thanks

## 2017-02-10 ENCOUNTER — Telehealth: Payer: Self-pay | Admitting: Family Medicine

## 2017-02-10 NOTE — Telephone Encounter (Signed)
Called pt's niece Guss Bunde regarding pt's urine sample, antibiotics and needing appoint for labs. Message left for niece to call us back.

## 2017-02-11 ENCOUNTER — Other Ambulatory Visit

## 2017-02-11 DIAGNOSIS — D649 Anemia, unspecified: Secondary | ICD-10-CM

## 2017-02-11 LAB — HEMOCCULT SLIDES (X 3 CARDS)
FECAL OCCULT BLD: NEGATIVE
OCCULT 1: NEGATIVE
OCCULT 2: NEGATIVE
OCCULT 3: NEGATIVE
OCCULT 4: NEGATIVE
OCCULT 5: NEGATIVE

## 2017-02-14 ENCOUNTER — Encounter: Payer: Self-pay | Admitting: *Deleted

## 2017-03-08 ENCOUNTER — Telehealth: Payer: Self-pay | Admitting: Family Medicine

## 2017-03-08 DIAGNOSIS — G453 Amaurosis fugax: Secondary | ICD-10-CM | POA: Diagnosis not present

## 2017-03-08 DIAGNOSIS — H34231 Retinal artery branch occlusion, right eye: Secondary | ICD-10-CM | POA: Diagnosis not present

## 2017-03-08 NOTE — Telephone Encounter (Signed)
Melonie Florida dropped off documentation from Dr Manuella Ghazi that pt needs to be seen for follow upfor transient loss of vision . He would like an ekg as well as coordinate a TTE and carotid artery to complete work-up. He gave orders for labs but pt will go to [facility]. I placed ppw on cart for your review.  Is it OK to wait until 11:30 on 11/30 or do want her scheduled earlier with another provider?

## 2017-03-08 NOTE — Telephone Encounter (Signed)
Since I do not know how urgent this is - I cannot say if she needs to be seen earlier (you can ask her)  I won't know until I review the note since it is not in epic

## 2017-03-09 NOTE — Telephone Encounter (Signed)
I have the letter from Dr Manuella Ghazi I will see her on 11/30 as planned  Thanks

## 2017-03-10 DIAGNOSIS — H34231 Retinal artery branch occlusion, right eye: Secondary | ICD-10-CM | POA: Diagnosis not present

## 2017-03-10 DIAGNOSIS — G453 Amaurosis fugax: Secondary | ICD-10-CM | POA: Diagnosis not present

## 2017-03-11 ENCOUNTER — Encounter: Payer: Self-pay | Admitting: Family Medicine

## 2017-03-11 ENCOUNTER — Other Ambulatory Visit: Payer: Self-pay | Admitting: Oncology

## 2017-03-11 ENCOUNTER — Ambulatory Visit (INDEPENDENT_AMBULATORY_CARE_PROVIDER_SITE_OTHER): Payer: Medicare Other | Admitting: Family Medicine

## 2017-03-11 VITALS — BP 130/76 | HR 59 | Temp 98.1°F | Ht 70.0 in | Wt 195.5 lb

## 2017-03-11 DIAGNOSIS — I10 Essential (primary) hypertension: Secondary | ICD-10-CM

## 2017-03-11 DIAGNOSIS — N3001 Acute cystitis with hematuria: Secondary | ICD-10-CM | POA: Diagnosis not present

## 2017-03-11 DIAGNOSIS — G8929 Other chronic pain: Secondary | ICD-10-CM | POA: Insufficient documentation

## 2017-03-11 DIAGNOSIS — H34231 Retinal artery branch occlusion, right eye: Secondary | ICD-10-CM

## 2017-03-11 DIAGNOSIS — R51 Headache: Secondary | ICD-10-CM

## 2017-03-11 DIAGNOSIS — R519 Headache, unspecified: Secondary | ICD-10-CM | POA: Insufficient documentation

## 2017-03-11 DIAGNOSIS — N390 Urinary tract infection, site not specified: Secondary | ICD-10-CM | POA: Insufficient documentation

## 2017-03-11 LAB — POC URINALSYSI DIPSTICK (AUTOMATED)
BILIRUBIN UA: NEGATIVE
Glucose, UA: NEGATIVE
Ketones, UA: NEGATIVE
Nitrite, UA: NEGATIVE
PH UA: 6 (ref 5.0–8.0)
RBC UA: NEGATIVE
SPEC GRAV UA: 1.02 (ref 1.010–1.025)
Urobilinogen, UA: 0.2 E.U./dL

## 2017-03-11 NOTE — Progress Notes (Signed)
Subjective:    Patient ID: Erin [name], female    DOB: March 04, 1929, 83 y.o.   MRN: [id number]  HPI Here for f/u of transient loss of vision in R eye over 2 weeks ago  We were notified by Dr Trena Platt office  She was diagnosed with a branch retinal artery occlusion and recommended EKG as well as TTE and Carotid artery study to complete her w/u  She states she woke up one one day and could not see out of R eye  Her eye doctor sent her to a sub specialist and retina had not detatched   She continues to have chronic headaches for which she takes aspirin - perhaps more headaches than usual  States she has had headaches on and off for over a year but more lately  Unsure if she was having a headache at her last neuro visit (had much improved last January) Was supposed to f/u 6 mo and did not    She also had labs for this that Dr Manuella Ghazi ordered at [facility] this am incl CRP, CBC and ESR   Wt Readings from Last 3 Encounters:  03/11/17 195 lb 8 oz (88.7 kg)  02/04/17 195 lb 8 oz (88.7 kg)  10/28/16 187 lb (84.8 kg)   28.05 kg/m  She also needs a f/u urine cx from last uti to document clearance (had klebsiella uti) treated with keflex Had ms change at that time- is better  occ has a little pink when she wipes    She does have hx of HTN , brain aneurysm and other chronic health problems at age 19 as well as tia in 2017  Takes ASA 325 mg daily   Wearing an eye patch- light bothers her but she can see better   Last carotid doppler showed 1-39% stenosis in both int carotid arteries 5/17 Last 2 d echo 5/17 showed grade 1 DD  EKG today read as sinus bradycardia rate of 56 with firs degree AV block    Last cr was elevated Lab Results  Component Value Date   CREATININE 1.32 (H) 01/28/2017   BUN 21 01/28/2017   NA 140 01/28/2017   K 4.0 01/28/2017   CL 106 01/28/2017   CO2 26 01/28/2017   Lab Results  Component Value Date   HGBA1C 6.0 01/28/2017   Lab Results  Component Value Date     CHOL 167 01/28/2017   HDL 41.60 01/28/2017   LDLCALC 89 01/28/2017   LDLDIRECT 58.0 07/30/2016   TRIG 182.0 (H) 01/28/2017   CHOLHDL 4 01/28/2017   bp is stable today  No cp or palpitations or headaches or edema  No side effects to medicines  BP Readings from Last 3 Encounters:  03/11/17 130/76  02/04/17 126/68  10/28/16 132/62      Patient Active Problem List   Diagnosis Date Noted  . Retinal artery occlusion, branch, right 03/11/2017  . UTI (urinary tract infection) 03/11/2017  . Chronic headache 03/11/2017  . Routine general medical examination at a health care facility 08/08/2016  . Eczema 01/02/2016  . Brain aneurysm 10/22/2015  . Dementia 09/23/2015  . Abnormality of gait 09/23/2015  . Aneurysm ([place])   . CVA (cerebral infarction)   . Goals of care, counseling/discussion   . Slow transit constipation   . Elevated serum creatinine 01/24/2014  . Risk for falls 07/05/2012  . Colon cancer H/O 04/07/2012  . Sacroiliac joint dysfunction 04/01/2012  . Sciatica 03/31/2012  . Low back pain  04/02/2011  . Other screening mammogram 09/30/2010  . Hyperglycemia 09/30/2010  . Post-menopausal 09/30/2010  . ABDOMINAL WALL HERNIA 02/26/2010  . Mild anemia 08/18/2009  . Essential hypertension 10/31/2007  . Hyperlipidemia LDL goal <100 07/26/2006  . HEMORRHOIDS 07/26/2006  . OSTEOARTHRITIS 07/26/2006  . Osteopenia 07/26/2006  . Disturbance in sleep behavior 07/26/2006   Past Medical History:  Diagnosis Date  . Diabetes mellitus   . Encephalopathy   . Hyperlipidemia   . Hypertension   . Memory loss   . Obesity   . Osteoarthritis   . Osteoporosis   . Sacroiliac joint dysfunction 04/01/2012  . 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 APPENDECTOMY    . cervicitis  1964   conization   . CHOLECYSTECTOMY    . COLON RESECTION  1988   secondary to cancer  . endometrial polyp  05/2000   hyperplasia-laser  treatment  . JOINT REPLACEMENT     Rt knee replacement  . POLYPECTOMY  1998   benign X 2  . TOTAL KNEE ARTHROPLASTY  07/2009   right    Social History   Tobacco Use  . Smoking status: Former Smoker    Types: Cigarettes    Last attempt to quit: 08/14/1962    Years since quitting: 54.6  . Smokeless tobacco: Never Used  . Tobacco comment: Quit over 10 years ago  Substance Use Topics  . Alcohol use: No    Alcohol/week: 0.0 oz  . Drug use: No   Family History  Problem Relation Age of Onset  . Kidney failure Mother   . Stroke Father   . Cancer Brother        lung   Allergies  Allergen Reactions  . Fluoxetine Hcl Other (See Comments)     vomiting  . Hydrocodone     Nausea and vomiting   . Shellfish Allergy Nausea And Vomiting    Just feels "sick'  . Diclofenac Sodium Other (See Comments)    : vomiting   Current Outpatient Medications on File Prior to Visit  Medication Sig Dispense Refill  . acetaminophen (TYLENOL) 500 MG tablet Take 1,000 mg by mouth every 4 (four) hours as needed.    Marland Kitchen aspirin 325 MG tablet Take 325 mg by mouth daily.     . calcium carbonate (OS-CAL) 600 MG TABS tablet Take 600 mg by mouth daily with breakfast.     . metoprolol tartrate (LOPRESSOR) 50 MG tablet Take 50 mg by mouth 2 (two) times daily.    Marland Kitchen senna-docusate (SENNA-S) 8.6-50 MG tablet TAKE TWO (2) TABLETS BY MOUTH 2 TIMES DAILY FOR CONSTIPATION 120 tablet 0  . triamcinolone (KENALOG) 0.025 % cream Apply 1 application topically 2 (two) times daily. To affected area as needed 30 g 1  . vitamin C (ASCORBIC ACID) 500 MG tablet Take 500 mg by mouth daily.      No current facility-administered medications on file prior to visit.     Review of Systems  Constitutional: Negative for activity change, appetite change, fatigue, fever and unexpected weight change.  HENT: Negative for congestion, ear pain, rhinorrhea, sinus pressure and sore throat.   Eyes: Positive for visual disturbance. Negative for  pain and redness.       Right eye  Respiratory: Negative for cough, shortness of breath and wheezing.   Cardiovascular: Negative for chest pain and palpitations.  Gastrointestinal: Negative for abdominal pain, blood in stool, constipation  and diarrhea.  Endocrine: Negative for polydipsia and polyuria.  Genitourinary: Negative for dysuria, frequency and urgency.  Musculoskeletal: Positive for arthralgias and back pain. Negative for myalgias.  Skin: Negative for pallor and rash.  Allergic/Immunologic: Negative for environmental allergies.  Neurological: Positive for headaches. Negative for dizziness and syncope.  Hematological: Negative for adenopathy. Does not bruise/bleed easily.  Psychiatric/Behavioral: Negative for decreased concentration and dysphoric mood. The patient is not nervous/anxious.        Objective:   Physical Exam  Constitutional: She appears well-developed and well-nourished. No distress.  overwt and well appearing  Exam done in wheelchair  HENT:  Head: Normocephalic and atraumatic.  Mouth/Throat: Oropharynx is clear and moist.  Eyes: Conjunctivae and EOM are normal. Pupils are equal, round, and reactive to light. Right eye exhibits no discharge. Left eye exhibits no discharge. No scleral icterus.  Pt wears eye patch over R eye due to light sensitivity Pupil is reactive and EOMs intact  Neck: Normal range of motion. Neck supple. No JVD present. Carotid bruit is not present. No thyromegaly present.  Cardiovascular: Normal rate, regular rhythm, normal heart sounds and intact distal pulses. Exam reveals no gallop.  Pulmonary/Chest: Effort normal and breath sounds normal. No respiratory distress. She has no wheezes. She has no rales.  No crackles  Abdominal: Soft. Bowel sounds are normal. She exhibits no distension, no abdominal bruit and no mass. There is no tenderness.  Exam done sitting  Musculoskeletal: She exhibits no edema.  Lymphadenopathy:    She has no cervical  adenopathy.  Neurological: She is alert. She has normal reflexes. No cranial nerve deficit or sensory deficit. She exhibits normal muscle tone. Coordination normal.  Skin: Skin is warm and dry. No rash noted. No pallor.  Psychiatric: She has a normal mood and affect.  Mentally sharp today and mood is good           Assessment & Plan:   Problem List Items Addressed This Visit      Cardiovascular and Mediastinum   Essential hypertension - Primary    bp in fair control at this time  BP Readings from Last 1 Encounters:  03/11/17 130/76   No changes needed Disc lifstyle change with low sodium diet and exercise        Retinal artery occlusion, branch, right    Suspected from eye specialist Dr Manuella Ghazi Symptoms improved in R eye (still light sensitive) TTE and carotid doppler ordered (reviewed last from 2017) Labs pending for headache as well (esr/cbc/crp) to r/o TA No acute changes on EKG today       Relevant Orders   EKG 12-Lead (Completed)   VAS US CAROTID   ECHOCARDIOGRAM COMPLETE     Genitourinary   UTI (urinary tract infection)    Had klebsiella uti in oct - due for f/u  Symptoms are resolved ua today- some wbc (no rbc) Pending cx       Relevant Orders   Urine Culture   POCT Urinalysis Dipstick (Automated) (Completed)     Other   Chronic headache    On and off but worse lately  Neuro aware and she is due for f/u With recent vision loss (transient) on R -will r/u temporal arteritis (labs done at [facility] from Dr Manuella Ghazi this am)

## 2017-03-11 NOTE — Patient Instructions (Signed)
We will re check urinalysis and culture today  Please try to drink 64 oz of fluids daily -mostly water Take care of yourself   I will watch for the labs from Dr Manuella Ghazi   We will go ahead and order the TEE (trans esophageal echo) and also the carotid ultrasound

## 2017-03-13 NOTE — Assessment & Plan Note (Addendum)
On and off but worse lately  Neuro aware and she is due for f/u With recent vision loss (transient) on R -will r/u temporal arteritis (labs done at [facility] from Dr Manuella Ghazi this am)

## 2017-03-13 NOTE — Assessment & Plan Note (Signed)
bp in fair control at this time  BP Readings from Last 1 Encounters:  03/11/17 130/76   No changes needed Disc lifstyle change with low sodium diet and exercise

## 2017-03-13 NOTE — Assessment & Plan Note (Signed)
Had klebsiella uti in oct - due for f/u  Symptoms are resolved ua today- some wbc (no rbc) Pending cx

## 2017-03-13 NOTE — Assessment & Plan Note (Addendum)
Suspected from eye specialist Dr Manuella Ghazi Symptoms improved in R eye (still light sensitive) TTE and carotid doppler ordered (reviewed last from 2017) Labs pending for headache as well (esr/cbc/crp) to r/o TA No acute changes on EKG today

## 2017-03-14 ENCOUNTER — Telehealth: Payer: Self-pay | Admitting: Family Medicine

## 2017-03-14 LAB — URINE CULTURE
MICRO NUMBER: 81348245
SPECIMEN QUALITY:: ADEQUATE

## 2017-03-14 MED ORDER — CIPROFLOXACIN HCL 250 MG PO TABS: 250.0000 mg | ORAL_TABLET | Freq: Two times a day (BID) | ORAL | 0 refills | Status: DC

## 2017-03-14 NOTE — Telephone Encounter (Signed)
Urine still has bacteria- the keflex did not take care of it all   I sent cipro in to [facility] pharmacy-hope this will do better   Re check culture only in 2-3 weeks

## 2017-03-14 NOTE — Telephone Encounter (Signed)
Erin [name] notified of Dr. Marliss Coots comments and instructions and verbalized understanding

## 2017-03-18 ENCOUNTER — Other Ambulatory Visit: Payer: Self-pay | Admitting: Family Medicine

## 2017-03-18 DIAGNOSIS — R001 Bradycardia, unspecified: Secondary | ICD-10-CM

## 2017-03-18 DIAGNOSIS — H34231 Retinal artery branch occlusion, right eye: Secondary | ICD-10-CM

## 2017-03-18 DIAGNOSIS — I440 Atrioventricular block, first degree: Secondary | ICD-10-CM

## 2017-03-28 ENCOUNTER — Encounter: Payer: Self-pay | Admitting: *Deleted

## 2017-03-28 ENCOUNTER — Telehealth: Payer: Self-pay | Admitting: Family Medicine

## 2017-03-28 NOTE — Telephone Encounter (Signed)
This encounter was created in error - please disregard.

## 2017-03-28 NOTE — Telephone Encounter (Signed)
This note made in error.  

## 2017-03-29 ENCOUNTER — Ambulatory Visit (INDEPENDENT_AMBULATORY_CARE_PROVIDER_SITE_OTHER): Payer: Medicare Other

## 2017-03-29 ENCOUNTER — Other Ambulatory Visit: Payer: Self-pay

## 2017-03-29 DIAGNOSIS — I440 Atrioventricular block, first degree: Secondary | ICD-10-CM | POA: Diagnosis not present

## 2017-03-29 DIAGNOSIS — H34231 Retinal artery branch occlusion, right eye: Secondary | ICD-10-CM

## 2017-03-29 DIAGNOSIS — R001 Bradycardia, unspecified: Secondary | ICD-10-CM

## 2017-03-29 DIAGNOSIS — Z87440 Personal history of urinary (tract) infections: Secondary | ICD-10-CM

## 2017-03-30 ENCOUNTER — Other Ambulatory Visit: Payer: Medicare Other

## 2017-03-30 ENCOUNTER — Encounter

## 2017-03-30 ENCOUNTER — Other Ambulatory Visit

## 2017-03-30 DIAGNOSIS — Z87440 Personal history of urinary (tract) infections: Secondary | ICD-10-CM | POA: Diagnosis not present

## 2017-03-30 DIAGNOSIS — N3001 Acute cystitis with hematuria: Secondary | ICD-10-CM | POA: Diagnosis not present

## 2017-03-30 DIAGNOSIS — R300 Dysuria: Secondary | ICD-10-CM | POA: Diagnosis not present

## 2017-03-30 LAB — VAS US CAROTID
LCCAPDIAS: 14 cm/s
LCCAPSYS: 83 cm/s
LEFT VERTEBRAL DIAS: 13 cm/s
LICAPSYS: -84 cm/s
Left CCA dist dias: -9 cm/s
Left CCA dist sys: -58 cm/s
Left ICA dist dias: -13 cm/s
Left ICA dist sys: -54 cm/s
Left ICA prox dias: -15 cm/s
RCCADSYS: -111 cm/s
RCCAPDIAS: 13 cm/s
RIGHT VERTEBRAL DIAS: 9 cm/s
Right CCA prox sys: 75 cm/s

## 2017-03-31 LAB — URINE CULTURE
MICRO NUMBER:: 81428314
Result:: NO GROWTH
SPECIMEN QUALITY:: ADEQUATE

## 2017-04-13 DIAGNOSIS — H34231 Retinal artery branch occlusion, right eye: Secondary | ICD-10-CM | POA: Diagnosis not present

## 2017-05-14 ENCOUNTER — Other Ambulatory Visit: Payer: Self-pay | Admitting: Family Medicine

## 2017-05-16 NOTE — Telephone Encounter (Signed)
Left VM asking niece Guss Bunde) to call the office and let us know who originally wrote Rx and also how is pt taking med (? 1 tab BID or 2 tabs BID)  CRM created

## 2017-05-16 NOTE — Telephone Encounter (Signed)
Doesn't look like you have filled this before and it's on her med list as a historical entry but it says take one tab twice daily and this refill is requesting 2 tablets twice daily, please advise

## 2017-05-16 NOTE — Telephone Encounter (Signed)
Please check in with her and ask who originally px it and how she is taking it  Thanks

## 2017-05-17 ENCOUNTER — Telehealth: Payer: Self-pay | Admitting: Family Medicine

## 2017-05-17 NOTE — Telephone Encounter (Signed)
Pt's niece ret'd call.  Reported that the pt. has been taking Metoprolol 50 mg, 1 tablet BID, for over a year.  Reported that she thought Dr. Glori Bickers had originally ordered this.  Wanted to make the doctor aware that she is out of this medication, since yesterday.  Requesting to [facility] [place].  Advised will send message to Dr. Glori Bickers.  Agrees w/ plan.

## 2017-05-17 NOTE — Telephone Encounter (Signed)
Will refill electronically  

## 2017-05-17 NOTE — Telephone Encounter (Signed)
See previous telephone note re: Metoprolol dose, routed back to Dr. Glori Bickers. (encounter dated 05/14/17)

## 2017-05-17 NOTE — Telephone Encounter (Signed)
Copied from [place]. Topic: Quick Communication - Rx Refill/Question >> May 17, 2017  2:25 PM Cleaster Corin, [place] wrote: Medication: lopressor   Has the patient contacted their pharmacy? yes   (Agent: If no, request that the patient contact the pharmacy for the refill.)   Preferred Pharmacy (with phone number or street name): [id number] [place], [address] [place] [address] Phone: [phone] Fax: [phone]     Agent: Please be advised that RX refills may take up to 3 business days. We ask that you follow-up with your pharmacy.

## 2017-07-28 ENCOUNTER — Encounter: Payer: Self-pay | Admitting: Primary Care

## 2017-07-28 ENCOUNTER — Ambulatory Visit (INDEPENDENT_AMBULATORY_CARE_PROVIDER_SITE_OTHER): Payer: Medicare Other | Admitting: Primary Care

## 2017-07-28 VITALS — BP 142/70 | HR 84 | Temp 97.6°F | Ht 70.0 in | Wt 193.2 lb

## 2017-07-28 DIAGNOSIS — R252 Cramp and spasm: Secondary | ICD-10-CM | POA: Diagnosis not present

## 2017-07-28 LAB — COMPREHENSIVE METABOLIC PANEL
ALBUMIN: 4.3 g/dL (ref 3.5–5.2)
ALK PHOS: 54 U/L (ref 39–117)
ALT: 12 U/L (ref 0–35)
AST: 29 U/L (ref 0–37)
BUN: 25 mg/dL — ABNORMAL HIGH (ref 6–23)
CHLORIDE: 96 meq/L (ref 96–112)
CO2: 26 mEq/L (ref 19–32)
Calcium: 10.6 mg/dL — ABNORMAL HIGH (ref 8.4–10.5)
Creatinine, Ser: 1.69 mg/dL — ABNORMAL HIGH (ref 0.40–1.20)
GFR: 36.81 mL/min — AB (ref >60.00)
GLUCOSE: 145 mg/dL — AB (ref 70–99)
Potassium: 4.1 mEq/L (ref 3.5–5.1)
Sodium: 132 mEq/L — ABNORMAL LOW (ref 135–145)
Total Bilirubin: 0.7 mg/dL (ref 0.2–1.2)
Total Protein: 8.5 g/dL — ABNORMAL HIGH (ref 6.0–8.3)

## 2017-07-28 LAB — CBC
HCT: 40.1 % (ref 36.0–46.0)
HEMOGLOBIN: 13.4 g/dL (ref 12.0–15.0)
MCHC: 33.5 g/dL (ref 30.0–36.0)
MCV: 88.5 fl (ref 78.0–100.0)
PLATELETS: 217 10*3/uL (ref 150.0–400.0)
RBC: 4.53 Mil/uL (ref 3.87–5.11)
RDW: 15.3 % (ref 11.5–15.5)
WBC: 8.6 10*3/uL (ref 4.0–10.5)

## 2017-07-28 LAB — VITAMIN B12: Vitamin B-12: 712 pg/mL (ref 211–911)

## 2017-07-28 NOTE — Patient Instructions (Signed)
Stop by the lab prior to leaving today. I will notify you of your results once received.   It's important to get up from sitting at least every hour. Use your walker to help with balance.   Increase water consumption, try to get at least 2-3 large cups daily.  Try to eat three small meals daily.   It was a pleasure to see you today!

## 2017-07-28 NOTE — Progress Notes (Signed)
Subjective:    Patient ID: Erin [name], female    DOB: November 21, 1928, 84 y.o.   MRN: [id number]  HPI  Ms. Celaya is an 84 year old female with a history of hypertension, osteoarthritis, osteopenia, anemia, UTI who presents today with a chief complaint of lower extremity cramping. Her family is with her today who is supplying most of the information for her HPI.   Her cramping is located to the bilateral anterior upper portion of her lower extremities. This began several days ago. She's not drinking much water, less than 8 ounces of water daily for the last 48 hours. Appetite has decreased significantly as she had nothing to eat the day prior to her visit. She spends most of her day in her wheelchair and will only get up with her walker to go to bed, bathroom, or kitchen.  Her family denies confusion, foul smelling urine, urinary frequency. She has not complained of flank pain or dysuria. She's not taken anything for her muscle cramping. No recent injury or trauma.   Review of Systems  Constitutional: Negative for fever.  Respiratory: Negative for shortness of breath.   Cardiovascular: Negative for chest pain.  Genitourinary: Negative for dysuria, frequency and hematuria.  Musculoskeletal: Positive for myalgias. Negative for back pain.       Bilateral anterior thigh cramping  Psychiatric/Behavioral: Negative for confusion.       Past Medical History:  Diagnosis Date  . Diabetes mellitus   . Encephalopathy   . Hyperlipidemia   . Hypertension   . Memory loss   . Obesity   . Osteoarthritis   . Osteoporosis   . Sacroiliac joint dysfunction 04/01/2012  . Upper GI bleed    AV malformation/when anticoag     Social History   Socioeconomic History  . Marital status: Widowed    Spouse name: Not on file  . Number of children: 1  . Years of education: 2 yrs coll  . Highest education level: Not on file  Occupational History  . Occupation: Retired  Scientific laboratory technician  . Financial resource  strain: Not on file  . Food insecurity:    Worry: Not on file    Inability: Not on file  . Transportation needs:    Medical: Not on file    Non-medical: Not on file  Tobacco Use  . Smoking status: Former Smoker    Types: Cigarettes    Last attempt to quit: 08/14/1962    Years since quitting: 54.9  . Smokeless tobacco: Never Used  . Tobacco comment: Quit over 10 years ago  Substance and Sexual Activity  . Alcohol use: No    Alcohol/week: 0.0 oz  . Drug use: No  . Sexual activity: Not on file  Lifestyle  . Physical activity:    Days per week: Not on file    Minutes per session: Not on file  . Stress: Not on file  Relationships  . Social connections:    Talks on phone: Not on file    Gets together: Not on file    Attends religious service: Not on file    Active member of club or organization: Not on file    Attends meetings of clubs or organizations: Not on file    Relationship status: Not on file  . Intimate partner violence:    Fear of current or ex partner: Not on file    Emotionally abused: Not on file    Physically abused: Not on file  Forced sexual activity: Not on file  Other Topics Concern  . Not on file  Social History Narrative   Lives with son who is mentally impaired.     She is temporarily in [facility] for rehab.   Right-handed.   No caffeine use.    Past Surgical History:  Procedure Laterality Date  . APPENDECTOMY    . cervicitis  1964   conization   . CHOLECYSTECTOMY    . COLON RESECTION  1988   secondary to cancer  . endometrial polyp  05/2000   hyperplasia-laser treatment  . JOINT REPLACEMENT     Rt knee replacement  . POLYPECTOMY  1998   benign X 2  . TOTAL KNEE ARTHROPLASTY  07/2009   right     Family History  Problem Relation Age of Onset  . Kidney failure Mother   . Stroke Father   . Cancer Brother        lung    Allergies  Allergen Reactions  . Fluoxetine Hcl Other (See Comments)     vomiting  . Hydrocodone     Nausea  and vomiting   . Shellfish Allergy Nausea And Vomiting    Just feels "sick'  . Diclofenac Sodium Other (See Comments)    : vomiting    No current facility-administered medications on file prior to visit.    Current Outpatient Medications on File Prior to Visit  Medication Sig Dispense Refill  . acetaminophen (TYLENOL) 500 MG tablet Take 1,000 mg by mouth every 4 (four) hours as needed.    Marland Kitchen aspirin 325 MG tablet Take 325 mg by mouth daily.     . calcium carbonate (OS-CAL) 600 MG TABS tablet Take 600 mg by mouth daily with breakfast.     . metoprolol tartrate (LOPRESSOR) 50 MG tablet TAKE TWO TABLETS BY MOUTH TWICE DAILY 120 tablet 5  . senna-docusate (SENNA-S) 8.6-50 MG tablet TAKE TWO (2) TABLETS BY MOUTH 2 TIMES DAILY FOR CONSTIPATION 120 tablet 0  . vitamin C (ASCORBIC ACID) 500 MG tablet Take 500 mg by mouth daily.       BP (!) 142/70   Pulse 84   Temp 97.6 F (36.4 C) (Oral)   Ht 5\' 10"  (1.778 m)   Wt 193 lb 4 oz (87.7 kg)   SpO2 98%   BMI 27.73 kg/m    Objective:   Physical Exam  Constitutional: She is oriented to person, place, and time. She does not appear ill.  Neck: Neck supple.  Cardiovascular: Normal rate and normal heart sounds.  Pulmonary/Chest: Effort normal and breath sounds normal.  Abdominal: Soft. There is no tenderness.  Musculoskeletal:  Bilateral thighs non tender. Good ROM to lower extremities. Generalized weakness to lower extremities but both equal bilaterally.   Neurological: She is alert and oriented to person, place, and time.  Answering questions appropriately when she does respond.   Skin: Skin is warm and dry.          Assessment & Plan:  Myalgias:  Located to bilateral lower extremities as discussed above. Could be secondary to acute dehydration from little PO intake, UTI, spasms due to inactivity. Attempted to obtain UA, patient could not urinate. Labs pending including CBC, CMP.  UPDATE:  Patient presented with her family to  [facility] as they found her confused.  Work up in the ED suggestive of acute encephalopathy in the setting of acute dehydration and UTI.  Patient admitted for treatment.   Pleas Koch, NP

## 2017-07-29 ENCOUNTER — Emergency Department (HOSPITAL_COMMUNITY): Payer: Medicare Other

## 2017-07-29 ENCOUNTER — Other Ambulatory Visit: Payer: Self-pay

## 2017-07-29 ENCOUNTER — Inpatient Hospital Stay (HOSPITAL_COMMUNITY)
Admission: EM | Admit: 2017-07-29 | Discharge: 2017-08-02 | DRG: 689 | Disposition: A | Discharge status: Skilled Nursing Facility | Payer: Medicare Other | Attending: Internal Medicine | Admitting: Internal Medicine

## 2017-07-29 ENCOUNTER — Encounter (HOSPITAL_COMMUNITY): Payer: Self-pay | Admitting: Internal Medicine

## 2017-07-29 DIAGNOSIS — Z885 Allergy status to narcotic agent status: Secondary | ICD-10-CM

## 2017-07-29 DIAGNOSIS — E871 Hypo-osmolality and hyponatremia: Secondary | ICD-10-CM | POA: Diagnosis present

## 2017-07-29 DIAGNOSIS — M6282 Rhabdomyolysis: Secondary | ICD-10-CM | POA: Diagnosis present

## 2017-07-29 DIAGNOSIS — E860 Dehydration: Secondary | ICD-10-CM | POA: Diagnosis present

## 2017-07-29 DIAGNOSIS — G9340 Encephalopathy, unspecified: Secondary | ICD-10-CM | POA: Diagnosis present

## 2017-07-29 DIAGNOSIS — I248 Other forms of acute ischemic heart disease: Secondary | ICD-10-CM | POA: Diagnosis present

## 2017-07-29 DIAGNOSIS — I129 Hypertensive chronic kidney disease with stage 1 through stage 4 chronic kidney disease, or unspecified chronic kidney disease: Secondary | ICD-10-CM | POA: Diagnosis present

## 2017-07-29 DIAGNOSIS — R0600 Dyspnea, unspecified: Secondary | ICD-10-CM | POA: Diagnosis not present

## 2017-07-29 DIAGNOSIS — R778 Other specified abnormalities of plasma proteins: Secondary | ICD-10-CM

## 2017-07-29 DIAGNOSIS — Z79899 Other long term (current) drug therapy: Secondary | ICD-10-CM | POA: Diagnosis not present

## 2017-07-29 DIAGNOSIS — Z91013 Allergy to seafood: Secondary | ICD-10-CM

## 2017-07-29 DIAGNOSIS — M810 Age-related osteoporosis without current pathological fracture: Secondary | ICD-10-CM | POA: Diagnosis present

## 2017-07-29 DIAGNOSIS — M1990 Unspecified osteoarthritis, unspecified site: Secondary | ICD-10-CM | POA: Diagnosis present

## 2017-07-29 DIAGNOSIS — B961 Klebsiella pneumoniae [K. pneumoniae] as the cause of diseases classified elsewhere: Secondary | ICD-10-CM | POA: Diagnosis present

## 2017-07-29 DIAGNOSIS — Y92002 Bathroom of unspecified non-institutional (private) residence single-family (private) house as the place of occurrence of the external cause: Secondary | ICD-10-CM | POA: Diagnosis not present

## 2017-07-29 DIAGNOSIS — M533 Sacrococcygeal disorders, not elsewhere classified: Secondary | ICD-10-CM | POA: Diagnosis present

## 2017-07-29 DIAGNOSIS — Z66 Do not resuscitate: Secondary | ICD-10-CM | POA: Diagnosis present

## 2017-07-29 DIAGNOSIS — E1122 Type 2 diabetes mellitus with diabetic chronic kidney disease: Secondary | ICD-10-CM | POA: Diagnosis present

## 2017-07-29 DIAGNOSIS — Z886 Allergy status to analgesic agent status: Secondary | ICD-10-CM

## 2017-07-29 DIAGNOSIS — R1031 Right lower quadrant pain: Secondary | ICD-10-CM | POA: Diagnosis present

## 2017-07-29 DIAGNOSIS — F0390 Unspecified dementia without behavioral disturbance: Secondary | ICD-10-CM | POA: Diagnosis present

## 2017-07-29 DIAGNOSIS — R748 Abnormal levels of other serum enzymes: Secondary | ICD-10-CM

## 2017-07-29 DIAGNOSIS — W19XXXA Unspecified fall, initial encounter: Secondary | ICD-10-CM | POA: Diagnosis present

## 2017-07-29 DIAGNOSIS — Z91040 Latex allergy status: Secondary | ICD-10-CM

## 2017-07-29 DIAGNOSIS — Z7982 Long term (current) use of aspirin: Secondary | ICD-10-CM

## 2017-07-29 DIAGNOSIS — Z96651 Presence of right artificial knee joint: Secondary | ICD-10-CM | POA: Diagnosis present

## 2017-07-29 DIAGNOSIS — R519 Headache, unspecified: Secondary | ICD-10-CM | POA: Diagnosis present

## 2017-07-29 DIAGNOSIS — I10 Essential (primary) hypertension: Secondary | ICD-10-CM | POA: Diagnosis not present

## 2017-07-29 DIAGNOSIS — R402413 Glasgow coma scale score 13-15, at hospital admission: Secondary | ICD-10-CM | POA: Diagnosis present

## 2017-07-29 DIAGNOSIS — E876 Hypokalemia: Secondary | ICD-10-CM | POA: Diagnosis present

## 2017-07-29 DIAGNOSIS — N179 Acute kidney failure, unspecified: Secondary | ICD-10-CM | POA: Diagnosis present

## 2017-07-29 DIAGNOSIS — N390 Urinary tract infection, site not specified: Principal | ICD-10-CM | POA: Diagnosis present

## 2017-07-29 DIAGNOSIS — Z87891 Personal history of nicotine dependence: Secondary | ICD-10-CM

## 2017-07-29 DIAGNOSIS — G92 Toxic encephalopathy: Secondary | ICD-10-CM | POA: Diagnosis present

## 2017-07-29 DIAGNOSIS — E785 Hyperlipidemia, unspecified: Secondary | ICD-10-CM | POA: Diagnosis present

## 2017-07-29 DIAGNOSIS — Z91048 Other nonmedicinal substance allergy status: Secondary | ICD-10-CM

## 2017-07-29 DIAGNOSIS — N189 Chronic kidney disease, unspecified: Secondary | ICD-10-CM | POA: Diagnosis not present

## 2017-07-29 DIAGNOSIS — N182 Chronic kidney disease, stage 2 (mild): Secondary | ICD-10-CM | POA: Diagnosis not present

## 2017-07-29 DIAGNOSIS — D631 Anemia in chronic kidney disease: Secondary | ICD-10-CM | POA: Diagnosis present

## 2017-07-29 DIAGNOSIS — N3001 Acute cystitis with hematuria: Secondary | ICD-10-CM

## 2017-07-29 DIAGNOSIS — Z9181 History of falling: Secondary | ICD-10-CM

## 2017-07-29 DIAGNOSIS — R7989 Other specified abnormal findings of blood chemistry: Secondary | ICD-10-CM | POA: Diagnosis not present

## 2017-07-29 DIAGNOSIS — N183 Chronic kidney disease, stage 3 (moderate): Secondary | ICD-10-CM | POA: Diagnosis present

## 2017-07-29 DIAGNOSIS — E872 Acidosis, unspecified: Secondary | ICD-10-CM

## 2017-07-29 DIAGNOSIS — R51 Headache: Secondary | ICD-10-CM

## 2017-07-29 LAB — URINALYSIS, ROUTINE W REFLEX MICROSCOPIC
Bilirubin Urine: NEGATIVE
GLUCOSE, UA: NEGATIVE mg/dL
Ketones, ur: 5 mg/dL — AB
Leukocytes, UA: NEGATIVE
NITRITE: NEGATIVE
Protein, ur: 100 mg/dL — AB
SPECIFIC GRAVITY, URINE: 1.009 (ref 1.005–1.030)
Squamous Epithelial / LPF: NONE SEEN
pH: 6 (ref 5.0–8.0)

## 2017-07-29 LAB — I-STAT CHEM 8, ED
BUN: 31 mg/dL — ABNORMAL HIGH (ref 6–20)
Calcium, Ion: 1.19 mmol/L (ref 1.15–1.40)
Chloride: 98 mmol/L — ABNORMAL LOW (ref 101–111)
Creatinine, Ser: 2.1 mg/dL — ABNORMAL HIGH (ref 0.44–1.00)
GLUCOSE: 124 mg/dL — AB (ref 65–99)
HCT: 44 % (ref 36.0–46.0)
Hemoglobin: 15 g/dL (ref 12.0–15.0)
Potassium: 4 mmol/L (ref 3.5–5.1)
Sodium: 133 mmol/L — ABNORMAL LOW (ref 135–145)
TCO2: 24 mmol/L (ref 22–32)

## 2017-07-29 LAB — COMPREHENSIVE METABOLIC PANEL
ALK PHOS: 57 U/L (ref 38–126)
ALT: 18 U/L (ref 14–54)
AST: 59 U/L — ABNORMAL HIGH (ref 15–41)
Albumin: 4.2 g/dL (ref 3.5–5.0)
Anion gap: 16 — ABNORMAL HIGH (ref 5–15)
BILIRUBIN TOTAL: 1 mg/dL (ref 0.3–1.2)
BUN: 29 mg/dL — AB (ref 6–20)
CALCIUM: 10.5 mg/dL — AB (ref 8.9–10.3)
CO2: 21 mmol/L — ABNORMAL LOW (ref 22–32)
CREATININE: 2.16 mg/dL — AB (ref 0.44–1.00)
Chloride: 96 mmol/L — ABNORMAL LOW (ref 101–111)
GFR, EST AFRICAN AMERICAN: 22 mL/min — AB (ref >60)
GFR, EST NON AFRICAN AMERICAN: 19 mL/min — AB (ref >60)
Glucose, Bld: 128 mg/dL — ABNORMAL HIGH (ref 65–99)
Potassium: 3.9 mmol/L (ref 3.5–5.1)
Sodium: 133 mmol/L — ABNORMAL LOW (ref 135–145)
Total Protein: 9 g/dL — ABNORMAL HIGH (ref 6.5–8.1)

## 2017-07-29 LAB — CBC WITH DIFFERENTIAL/PLATELET
Basophils Absolute: 0 10*3/uL (ref 0.0–0.1)
Basophils Relative: 0 %
EOS PCT: 0 %
Eosinophils Absolute: 0 10*3/uL (ref 0.0–0.7)
HEMATOCRIT: 39.7 % (ref 36.0–46.0)
HEMOGLOBIN: 13.2 g/dL (ref 12.0–15.0)
LYMPHS ABS: 1.6 10*3/uL (ref 0.7–4.0)
LYMPHS PCT: 11 %
MCH: 29.1 pg (ref 26.0–34.0)
MCHC: 33.2 g/dL (ref 30.0–36.0)
MCV: 87.4 fL (ref 78.0–100.0)
Monocytes Absolute: 1.7 10*3/uL — ABNORMAL HIGH (ref 0.1–1.0)
Monocytes Relative: 12 %
NEUTROS PCT: 77 %
Neutro Abs: 11.2 10*3/uL — ABNORMAL HIGH (ref 1.7–7.7)
Platelets: 174 10*3/uL (ref 150–400)
RBC: 4.54 MIL/uL (ref 3.87–5.11)
RDW: 14.6 % (ref 11.5–15.5)
WBC: 14.4 10*3/uL — AB (ref 4.0–10.5)

## 2017-07-29 LAB — CREATININE, SERUM
Creatinine, Ser: 1.89 mg/dL — ABNORMAL HIGH (ref 0.44–1.00)
GFR calc non Af Amer: 23 mL/min — ABNORMAL LOW (ref >60)
GFR, EST AFRICAN AMERICAN: 26 mL/min — AB (ref >60)

## 2017-07-29 LAB — LACTIC ACID, PLASMA
LACTIC ACID, VENOUS: 1.6 mmol/L (ref 0.5–1.9)
LACTIC ACID, VENOUS: 1.2 mmol/L (ref 0.5–1.9)

## 2017-07-29 LAB — I-STAT CG4 LACTIC ACID, ED
Lactic Acid, Venous: 3.75 mmol/L (ref 0.5–1.9)
Lactic Acid, Venous: 1.87 mmol/L (ref 0.5–1.9)

## 2017-07-29 LAB — I-STAT TROPONIN, ED
TROPONIN I, POC: 0.35 ng/mL — AB (ref 0.00–0.08)
Troponin i, poc: 0.26 ng/mL (ref 0.00–0.08)

## 2017-07-29 LAB — CBG MONITORING, ED: GLUCOSE-CAPILLARY: 103 mg/dL — AB (ref 65–99)

## 2017-07-29 LAB — CK: CK TOTAL: 1671 U/L — AB (ref 38–234)

## 2017-07-29 LAB — TROPONIN I: Troponin I: 0.4 ng/mL (ref <0.03)

## 2017-07-29 LAB — GLUCOSE, CAPILLARY: Glucose-Capillary: 153 mg/dL — ABNORMAL HIGH (ref 65–99)

## 2017-07-29 LAB — VITAMIN B12: VITAMIN B 12: 546 pg/mL (ref 180–914)

## 2017-07-29 MED ORDER — ACETAMINOPHEN 500 MG PO TABS
1000.0000 mg | ORAL_TABLET | ORAL | Status: DC | PRN
  Administered 2017-07-29: 1000 mg via ORAL
  Filled 2017-07-29: qty 2

## 2017-07-29 MED ORDER — ACETAMINOPHEN 325 MG PO TABS
650.0000 mg | ORAL_TABLET | Freq: Four times a day (QID) | ORAL | Status: DC | PRN
  Administered 2017-07-31 – 2017-08-01 (×4): 650 mg via ORAL
  Filled 2017-07-29 (×4): qty 2

## 2017-07-29 MED ORDER — PIPERACILLIN-TAZOBACTAM IN DEX 2-0.25 GM/50ML IV SOLN
2.2500 g | Freq: Three times a day (TID) | INTRAVENOUS | Status: DC
  Administered 2017-07-29: 2.25 g via INTRAVENOUS
  Filled 2017-07-29 (×2): qty 50

## 2017-07-29 MED ORDER — PIPERACILLIN-TAZOBACTAM 3.375 G IVPB
3.3750 g | Freq: Three times a day (TID) | INTRAVENOUS | Status: DC
  Administered 2017-07-29 – 2017-07-30 (×2): 3.375 g via INTRAVENOUS
  Filled 2017-07-29 (×3): qty 50

## 2017-07-29 MED ORDER — SODIUM CHLORIDE 0.9 % IV BOLUS
1000.0000 mL | Freq: Once | INTRAVENOUS | Status: AC
  Administered 2017-07-29: 1000 mL via INTRAVENOUS

## 2017-07-29 MED ORDER — SENNOSIDES-DOCUSATE SODIUM 8.6-50 MG PO TABS
1.0000 | ORAL_TABLET | Freq: Two times a day (BID) | ORAL | Status: DC
  Administered 2017-07-29 – 2017-08-02 (×8): 1 via ORAL
  Filled 2017-07-29 (×8): qty 1

## 2017-07-29 MED ORDER — HEPARIN SODIUM (PORCINE) 5000 UNIT/ML IJ SOLN
5000.0000 [IU] | Freq: Three times a day (TID) | INTRAMUSCULAR | Status: DC
  Administered 2017-07-29 – 2017-08-02 (×11): 5000 [IU] via SUBCUTANEOUS
  Filled 2017-07-29 (×11): qty 1

## 2017-07-29 MED ORDER — SODIUM CHLORIDE 0.9 % IV SOLN
INTRAVENOUS | Status: DC
  Administered 2017-07-29 – 2017-07-30 (×2): via INTRAVENOUS

## 2017-07-29 MED ORDER — ASPIRIN 325 MG PO TABS
325.0000 mg | ORAL_TABLET | Freq: Every day | ORAL | Status: DC
  Administered 2017-07-29 – 2017-08-02 (×5): 325 mg via ORAL
  Filled 2017-07-29 (×5): qty 1

## 2017-07-29 MED ORDER — VITAMIN C 500 MG PO TABS
500.0000 mg | ORAL_TABLET | Freq: Every day | ORAL | Status: DC
  Administered 2017-07-29 – 2017-08-02 (×5): 500 mg via ORAL
  Filled 2017-07-29 (×5): qty 1

## 2017-07-29 MED ORDER — PIPERACILLIN-TAZOBACTAM 3.375 G IVPB 30 MIN
3.3750 g | Freq: Once | INTRAVENOUS | Status: AC
  Administered 2017-07-29: 3.375 g via INTRAVENOUS
  Filled 2017-07-29: qty 50

## 2017-07-29 MED ORDER — ONDANSETRON HCL 4 MG PO TABS: 4.0000 mg | ORAL_TABLET | Freq: Four times a day (QID) | ORAL | Status: DC | PRN

## 2017-07-29 MED ORDER — METOPROLOL TARTRATE 100 MG PO TABS
100.0000 mg | ORAL_TABLET | Freq: Two times a day (BID) | ORAL | Status: DC
  Administered 2017-07-29 – 2017-07-30 (×3): 100 mg via ORAL
  Filled 2017-07-29: qty 1
  Filled 2017-07-29: qty 4
  Filled 2017-07-29: qty 1

## 2017-07-29 MED ORDER — TRAMADOL HCL 50 MG PO TABS
50.0000 mg | ORAL_TABLET | Freq: Once | ORAL | Status: AC
  Administered 2017-07-30: 50 mg via ORAL
  Filled 2017-07-29 (×2): qty 1

## 2017-07-29 MED ORDER — ONDANSETRON HCL 4 MG/2ML IJ SOLN
4.0000 mg | Freq: Four times a day (QID) | INTRAMUSCULAR | Status: DC | PRN
  Administered 2017-07-29: 4 mg via INTRAVENOUS
  Filled 2017-07-29: qty 2

## 2017-07-29 MED ORDER — IOPAMIDOL (ISOVUE-370) INJECTION 76%: 100.0000 mL | Freq: Once | INTRAVENOUS | Status: DC | PRN

## 2017-07-29 MED ORDER — CALCIUM CARBONATE 600 MG PO TABS: 600.0000 mg | ORAL_TABLET | Freq: Every day | ORAL | Status: DC

## 2017-07-29 MED ORDER — HYDRALAZINE HCL 20 MG/ML IJ SOLN
5.0000 mg | INTRAMUSCULAR | Status: DC | PRN
  Administered 2017-07-29 – 2017-08-02 (×7): 5 mg via INTRAVENOUS
  Filled 2017-07-29 (×7): qty 1

## 2017-07-29 MED ORDER — ACETAMINOPHEN 650 MG RE SUPP
650.0000 mg | Freq: Four times a day (QID) | RECTAL | Status: DC | PRN
Start: 2017-07-29 — End: 2017-08-02

## 2017-07-29 NOTE — ED Provider Notes (Signed)
[facility] Provider Note   CSN: [id number] Arrival date & time: 07/29/17  0908     History   Chief Complaint Chief Complaint  Patient presents with  . Fall  . Altered Mental Status    HPI Erin [name] is a 84 y.o. female.  HPI   84 year old female with a history of diabetes, hypertension, hyperlipidemia, mild memory loss, colon cancer, who presented with concern for altered mental status.  Family reports that she was in normal state of health last night at 9 PM, however when the caregiver went to go check on patient today they found her altered, laying on the bathroom floor.  The not noted focal weakness or focal numbness.  They report that prior to today, they have not noted any headache, chest pain, shortness of breath, cough, fevers, urinary symptoms or abdominal pain.  Patient is unable to provide history given her altered mental status.  Family reports that at baseline, she is alert and oriented x4, walks with a walker most of the time, and had planned church event.   History limited by altered mental status, taken from Niece.  Past Medical History:  Diagnosis Date  . Diabetes mellitus   . Encephalopathy   . Hyperlipidemia   . Hypertension   . Memory loss   . Obesity   . Osteoarthritis   . Osteoporosis   . Sacroiliac joint dysfunction 04/01/2012  . Upper GI bleed    AV malformation/when anticoag    Patient Active Problem List   Diagnosis Date Noted  . Elevated lactic acid level 07/29/2017  . Hypercalcemia 07/29/2017  . Elevated troponin 07/29/2017  . Retinal artery occlusion, branch, right 03/11/2017  . UTI (urinary tract infection) 03/11/2017  . Chronic headache 03/11/2017  . Routine general medical examination at a health care facility 08/08/2016  . Eczema 01/02/2016  . Brain aneurysm 10/22/2015  . Dementia 09/23/2015  . Abnormality of gait 09/23/2015  . Acute encephalopathy   . Aneurysm ([place])   . H/O: CVA (cerebrovascular accident)    . Goals of care, counseling/discussion   . Slow transit constipation   . Elevated serum creatinine 01/24/2014  . Risk for falls 07/05/2012  . Acute renal failure superimposed on stage 2 chronic kidney disease ([place]) 04/16/2012  . Colon cancer H/O 04/07/2012  . Sacroiliac joint dysfunction 04/01/2012  . Sciatica 03/31/2012  . Low back pain 04/02/2011  . Other screening mammogram 09/30/2010  . Hyperglycemia 09/30/2010  . Post-menopausal 09/30/2010  . ABDOMINAL WALL HERNIA 02/26/2010  . Mild anemia 08/18/2009  . Essential hypertension 10/31/2007  . Hyperlipidemia LDL goal <100 07/26/2006  . HEMORRHOIDS 07/26/2006  . OSTEOARTHRITIS 07/26/2006  . Osteopenia 07/26/2006  . Disturbance in sleep behavior 07/26/2006    Past Surgical History:  Procedure Laterality Date  . APPENDECTOMY    . cervicitis  1964   conization   . CHOLECYSTECTOMY    . COLON RESECTION  1988   secondary to cancer  . endometrial polyp  05/2000   hyperplasia-laser treatment  . JOINT REPLACEMENT     Rt knee replacement  . POLYPECTOMY  1998   benign X 2  . TOTAL KNEE ARTHROPLASTY  07/2009   right      OB History   None      Home Medications    Prior to Admission medications   Medication Sig Start Date End Date Taking? Authorizing Provider  acetaminophen (TYLENOL) 500 MG tablet Take 1,000 mg by mouth every 4 (four) hours  as needed.   Yes [provider]  aspirin 325 MG tablet Take 325 mg by mouth daily.    Yes [provider]  calcium carbonate (OS-CAL) 600 MG TABS tablet Take 600 mg by mouth daily with breakfast.    Yes [provider]  ibuprofen (ADVIL,MOTRIN) 200 MG tablet Take 600 mg by mouth every 6 (six) hours as needed for moderate pain.   Yes [provider]  metoprolol tartrate (LOPRESSOR) 50 MG tablet TAKE TWO TABLETS BY MOUTH TWICE DAILY 05/17/17  Yes Tower, Wynelle Fanny, MD  senna-docusate (SENNA-S) 8.6-50 MG tablet TAKE TWO (2) TABLETS BY MOUTH 2 TIMES DAILY FOR  CONSTIPATION 01/26/16  Yes Pleas Koch, NP  vitamin C (ASCORBIC ACID) 500 MG tablet Take 500 mg by mouth daily.    Yes [provider]    Family History Family History  Problem Relation Age of Onset  . Kidney failure Mother   . Stroke Father   . Cancer Brother        lung    Social History Social History   Tobacco Use  . Smoking status: Former Smoker    Types: Cigarettes    Last attempt to quit: 08/14/1962    Years since quitting: 54.9  . Smokeless tobacco: Never Used  . Tobacco comment: Quit over 10 years ago  Substance Use Topics  . Alcohol use: No    Alcohol/week: 0.0 oz  . Drug use: No     Allergies   Fluoxetine hcl; Hydrocodone; Shellfish allergy; and Diclofenac sodium   Review of Systems Review of Systems  Constitutional: Positive for fatigue. Negative for fever.  Respiratory: Negative for cough.   Cardiovascular: Negative for chest pain.  Gastrointestinal: Negative for nausea and vomiting.  Genitourinary: Negative for difficulty urinating.  Musculoskeletal: Negative for back pain.  Neurological: Negative for weakness, numbness and headaches.  Psychiatric/Behavioral: Positive for confusion.     Physical Exam Updated Vital Signs BP (!) 174/73   Pulse 82   Temp 98.1 F (36.7 C) (Oral)   Resp 17   Ht 5\' 7"  (1.702 m)   Wt 87.5 kg (193 lb)   SpO2 96%   BMI 30.23 kg/m   Physical Exam  Constitutional: She appears well-developed and well-nourished. She appears listless. No distress.  HENT:  Head: Normocephalic and atraumatic.  Eyes: Conjunctivae and EOM are normal.  Neck: Normal range of motion.  Cardiovascular: Normal rate, regular rhythm, normal heart sounds and intact distal pulses. Exam reveals no gallop and no friction rub.  No murmur heard. Pulmonary/Chest: Effort normal and breath sounds normal. No respiratory distress. She has no wheezes. She has no rales.  Abdominal: Soft. She exhibits no distension. There is no tenderness.  There is no guarding.  Musculoskeletal: She exhibits no edema or tenderness.  Neurological: She appears listless.  Patient reports "I don't feel well" Not able to properly follow commands for exam, noted ot have equal movement of upper and lower extremities Will sometimes respond to questions with nonsensical answers  Skin: Skin is warm and dry. No rash noted. She is not diaphoretic. No erythema.  Nursing note and vitals reviewed.    ED Treatments / Results  Labs (all labs ordered are listed, but only abnormal results are displayed) Labs Reviewed  CBC WITH DIFFERENTIAL/PLATELET - Abnormal; Notable for the following components:      Result Value   WBC 14.4 (*)    Neutro Abs 11.2 (*)    Monocytes Absolute 1.7 (*)  All other components within normal limits  COMPREHENSIVE METABOLIC PANEL - Abnormal; Notable for the following components:   Sodium 133 (*)    Chloride 96 (*)    CO2 21 (*)    Glucose, Bld 128 (*)    BUN 29 (*)    Creatinine, Ser 2.16 (*)    Calcium 10.5 (*)    Total Protein 9.0 (*)    AST 59 (*)    GFR calc non Af Amer 19 (*)    GFR calc Af Amer 22 (*)    Anion gap 16 (*)    All other components within normal limits  URINALYSIS, ROUTINE W REFLEX MICROSCOPIC - Abnormal; Notable for the following components:   APPearance HAZY (*)    Hgb urine dipstick LARGE (*)    Ketones, ur 5 (*)    Protein, ur 100 (*)    Bacteria, UA MANY (*)    All other components within normal limits  CK - Abnormal; Notable for the following components:   Total CK 1,671 (*)    All other components within normal limits  CREATININE, SERUM - Abnormal; Notable for the following components:   Creatinine, Ser 1.89 (*)    GFR calc non Af Amer 23 (*)    GFR calc Af Amer 26 (*)    All other components within normal limits  I-STAT CG4 LACTIC ACID, ED - Abnormal; Notable for the following components:   Lactic Acid, Venous 3.75 (*)    All other components within normal limits  I-STAT CHEM 8, ED  - Abnormal; Notable for the following components:   Sodium 133 (*)    Chloride 98 (*)    BUN 31 (*)    Creatinine, Ser 2.10 (*)    Glucose, Bld 124 (*)    All other components within normal limits  CBG MONITORING, ED - Abnormal; Notable for the following components:   Glucose-Capillary 103 (*)    All other components within normal limits  I-STAT TROPONIN, ED - Abnormal; Notable for the following components:   Troponin i, poc 0.26 (*)    All other components within normal limits  I-STAT TROPONIN, ED - Abnormal; Notable for the following components:   Troponin i, poc 0.35 (*)    All other components within normal limits  URINE CULTURE  CULTURE, BLOOD (ROUTINE X 2)  CULTURE, BLOOD (ROUTINE X 2)  VITAMIN B12  LACTIC ACID, PLASMA  FOLATE RBC  LACTIC ACID, PLASMA  TROPONIN I  TROPONIN I  CBC  COMPREHENSIVE METABOLIC PANEL  TROPONIN I  I-STAT CG4 LACTIC ACID, ED    EKG EKG Interpretation  Date/Time:  Friday July 29 2017 09:15:06 EDT Ventricular Rate:  88 PR Interval:    QRS Duration: 78 QT Interval:  408 QTC Calculation: 494 R Axis:   34 Text Interpretation:  Sinus rhythm Probable anteroseptal infarct, old Nonspecific repol abnormality, diffuse leads No significant change since last tracing Confirmed by Gareth Morgan [id number]) on 07/29/2017 10:47:42 AM   Radiology Dg Chest 2 View  Result Date: 07/29/2017 CLINICAL DATA:  Altered mental status. EXAM: CHEST - 2 VIEW COMPARISON:  08/25/2015. FINDINGS: Mediastinum hilar structures normal. Heart size normal. Left base. No pleural effusion or pneumothorax. Degenerative change thoracic spine. IMPRESSION: Left base atelectasis. Electronically Signed   By: Marcello Moores  Register   On: 07/29/2017 10:54   Ct Head Wo Contrast  Result Date: 07/29/2017 CLINICAL DATA:  Cervical spine trauma. EXAM: CT HEAD WITHOUT CONTRAST CT CERVICAL SPINE WITHOUT CONTRAST TECHNIQUE: Multidetector  CT imaging of the head and cervical spine was performed  following the standard protocol without intravenous contrast. Multiplanar CT image reconstructions of the cervical spine were also generated. COMPARISON:  None. FINDINGS: CT HEAD FINDINGS Brain: Mild chronic ischemic white matter disease is noted. No mass effect or midline shift is noted. Ventricular size is within normal limits. There is no evidence of mass lesion, hemorrhage or acute infarction. Vascular: No hyperdense vessel or unexpected calcification. Skull: Normal. Negative for fracture or focal lesion. Sinuses/Orbits: No acute finding. Other: None. CT CERVICAL SPINE FINDINGS Alignment: Normal. Skull base and vertebrae: No acute fracture. No primary bone lesion or focal pathologic process. Soft tissues and spinal canal: No prevertebral fluid or swelling. No visible canal hematoma. Disc levels: Mild degenerative disc disease is noted at C5-6. Moderate degenerative disc disease is noted at C6-7 with anterior osteophyte formation. Upper chest: Biapical scarring is noted. Other: Degenerative changes seen involving posterior facet joints bilaterally. IMPRESSION: Mild chronic ischemic white matter disease. No acute intracranial abnormality seen. Multilevel degenerative disc disease is noted in the cervical spine. No fracture or spondylolisthesis is noted. Electronically Signed   By: Marijo Conception, M.D.   On: 07/29/2017 11:03   Ct Cervical Spine Wo Contrast  Result Date: 07/29/2017 CLINICAL DATA:  Cervical spine trauma. EXAM: CT HEAD WITHOUT CONTRAST CT CERVICAL SPINE WITHOUT CONTRAST TECHNIQUE: Multidetector CT imaging of the head and cervical spine was performed following the standard protocol without intravenous contrast. Multiplanar CT image reconstructions of the cervical spine were also generated. COMPARISON:  None. FINDINGS: CT HEAD FINDINGS Brain: Mild chronic ischemic white matter disease is noted. No mass effect or midline shift is noted. Ventricular size is within normal limits. There is no evidence  of mass lesion, hemorrhage or acute infarction. Vascular: No hyperdense vessel or unexpected calcification. Skull: Normal. Negative for fracture or focal lesion. Sinuses/Orbits: No acute finding. Other: None. CT CERVICAL SPINE FINDINGS Alignment: Normal. Skull base and vertebrae: No acute fracture. No primary bone lesion or focal pathologic process. Soft tissues and spinal canal: No prevertebral fluid or swelling. No visible canal hematoma. Disc levels: Mild degenerative disc disease is noted at C5-6. Moderate degenerative disc disease is noted at C6-7 with anterior osteophyte formation. Upper chest: Biapical scarring is noted. Other: Degenerative changes seen involving posterior facet joints bilaterally. IMPRESSION: Mild chronic ischemic white matter disease. No acute intracranial abnormality seen. Multilevel degenerative disc disease is noted in the cervical spine. No fracture or spondylolisthesis is noted. Electronically Signed   By: Marijo Conception, M.D.   On: 07/29/2017 11:03    Procedures .Critical Care Performed by: Gareth Morgan, MD Authorized by: Gareth Morgan, MD   Critical care provider statement:    Critical care time (minutes):  30   Critical care was necessary to treat or prevent imminent or life-threatening deterioration of the following conditions:  Sepsis and cardiac failure   Critical care was time spent personally by me on the following activities:  Examination of patient, obtaining history from patient or surrogate, review of old charts, ordering and review of radiographic studies, ordering and review of laboratory studies, ordering and performing treatments and interventions, pulse oximetry and re-evaluation of patient's condition   (including critical care time)  Medications Ordered in ED Medications  piperacillin-tazobactam (ZOSYN) IVPB 2.25 g (2.25 g Intravenous New Bag/Given 07/29/17 1833)  acetaminophen (TYLENOL) tablet 1,000 mg (has no administration in time range)    aspirin tablet 325 mg (325 mg Oral Given 07/29/17 1600)  metoprolol  tartrate (LOPRESSOR) tablet 100 mg (100 mg Oral Given 07/29/17 1600)  senna-docusate (Senokot-S) tablet 1 tablet (has no administration in time range)  vitamin C (ASCORBIC ACID) tablet 500 mg (has no administration in time range)  heparin injection 5,000 Units (has no administration in time range)  0.9 %  sodium chloride infusion ( Intravenous New Bag/Given 07/29/17 1609)  acetaminophen (TYLENOL) tablet 650 mg (has no administration in time range)    Or  acetaminophen (TYLENOL) suppository 650 mg (has no administration in time range)  ondansetron (ZOFRAN) tablet 4 mg ( Oral See Alternative 07/29/17 1608)    Or  ondansetron (ZOFRAN) injection 4 mg (4 mg Intravenous Given 07/29/17 1608)  hydrALAZINE (APRESOLINE) injection 5 mg (5 mg Intravenous Given 07/29/17 1601)  sodium chloride 0.9 % bolus 1,000 mL (0 mLs Intravenous Stopped 07/29/17 1235)  piperacillin-tazobactam (ZOSYN) IVPB 3.375 g (0 g Intravenous Stopped 07/29/17 1305)     Initial Impression / Assessment and Plan / ED Course  I have reviewed the triage vital signs and the nursing notes.  Pertinent labs & imaging results that were available during my care of the patient were reviewed by me and considered in my medical decision making (see chart for details).     84 year old female with a history of diabetes, hypertension, hyperlipidemia, mild memory loss, colon cancer, who presented with concern for altered mental status.  Differential diagnosis includes encephalopathy secondary to infection, CVA, ACS.  CT head was done which showed no evidence of acute intracranial bleed, and showed chronic microvascular ischemia.  CT cervical spine was done given history of fall and showed no acute findings.  Patient with labs significant for mild acute kidney injury, leukocytosis to 15,000, as well as a lactic acid of 3.75.  Possible sepsis versus dehydration with patient laying on the  ground with unknown downtime.  CK was ordered and pending.  X-ray shows no signs of pneumonia.  Urinalysis is concerning for likely urinary tract infection.  Patient had been ordered empiric Zosyn and IV fluids after initial lactic acid returned.  Repeat lactic acid is within normal limits.  Patient also noted to have slightly elevated troponin, with differential including possible ACS, or acute stress from urinary tract infection or other event such as CVA.  Patient does not have focal neurologic abnormalities on exam, and overall suspect that confusion and difficulty speaking is due to encephalopathy on exam, however if she does not improve would pursue further intracranial imaging. BP elevated to 200s, will allow permissive htn at this time in setting of continued stroke r/o, do not feel elevation is etiology of AMS at this time. She is admitted to the hospitalist service for continued care, suspect encephalopathy from UTI/sepsis, r/o cardiac ischemia and CVA.   Final Clinical Impressions(s) / ED Diagnoses   Final diagnoses:  Encephalopathy  Urinary tract infection without hematuria, site unspecified  Lactic acidosis  Elevated troponin    ED Discharge Orders    None       Gareth Morgan, MD 07/29/17 [phone]

## 2017-07-29 NOTE — ED Notes (Signed)
Patient transported to CT 

## 2017-07-29 NOTE — ED Notes (Signed)
Attempted report x1. 

## 2017-07-29 NOTE — ED Notes (Signed)
Pt's niece left phone number in case needed while out of dept [phone]

## 2017-07-29 NOTE — ED Triage Notes (Signed)
Patient had an unwitnessed fall this AM in bathroom, unknown down time. Last seen approx 9:00pm last night. Per caregiver patient appears to also be AMS at this time.

## 2017-07-29 NOTE — H&P (Addendum)
History and Physical    Erin [name] OVF:[id number] DOB: 1928-08-31 DOA: 07/29/2017  PCP: Abner Greenspan, MD Patient coming from: home  Chief Complaint: encephalopathy  HPI: Erin [name] is a very pleasant 84 y.o. female with medical history significant for hypertension, anemia,diabetes, hyperlipidemia, right kidney disease, colon cancer dementia presents to the emergency Department chief complaint of acute encephalopathy. Initial evaluation reveals possible urinary tract infection likely dehydration and hypercalcemia. [facility] are asked to admit  Information is obtained from the patient and her niece who is at the bedside noting that information from patient may be unreliable. Niece reports when she went to check on patient this morning she found her on the floor. She was conscious but confused. Patient lives at home with her special needs son. There is a daytime caregiver that comes to provide assistance to the patient. Patient reports patient's by mouth intake has been on the decline over the last few weeks. She denies any unintentional weight loss but states that the patient's appetite waxes and wanes. No reports of any nausea vomiting diarrhea. No fever chills complaints of headache dizziness syncope or near-syncope. Patient denies any abdominal pain dysuria hematuria frequency or urgency. He denies chest pain palpitation shortness of breath she denies headache dizziness. She denies any diarrhea constipation melena bright red blood per rectum.    ED Course: in the emergency department afebrile with a blood pressure is elevated. She is not hypoxic. Provided with IV fluids and IV antibiotics for mild leukocytosis and an elevated lactic acid level and urinalysis concerning for uti  Review of Systems: As per HPI otherwise all other systems reviewed and are negative.   Ambulatory Status: ambulates with a walker to assist with ADLs  Past Medical History:  Diagnosis Date  . Diabetes  mellitus   . Encephalopathy   . Hyperlipidemia   . Hypertension   . Memory loss   . Obesity   . Osteoarthritis   . Osteoporosis   . Sacroiliac joint dysfunction 04/01/2012  . 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 APPENDECTOMY    . cervicitis  1964   conization   . CHOLECYSTECTOMY    . COLON RESECTION  1988   secondary to cancer  . endometrial polyp  05/2000   hyperplasia-laser treatment  . JOINT REPLACEMENT     Rt knee replacement  . POLYPECTOMY  1998   benign X 2  . TOTAL KNEE ARTHROPLASTY  07/2009   right     Social History   Socioeconomic History  . Marital status: Widowed    Spouse name: Not on file  . Number of children: 1  . Years of education: 2 yrs coll  . Highest education level: Not on file  Occupational History  . Occupation: Retired  Scientific laboratory technician  . Financial resource strain: Not on file  . Food insecurity:    Worry: Not on file    Inability: Not on file  . Transportation needs:    Medical: Not on file    Non-medical: Not on file  Tobacco Use  . Smoking status: Former Smoker    Types: Cigarettes    Last attempt to quit: 08/14/1962    Years since quitting: 54.9  . Smokeless tobacco: Never Used  . Tobacco comment: Quit over 10 years ago  Substance and Sexual Activity  . Alcohol use: No    Alcohol/week: 0.0 oz  . Drug use:  No  . Sexual activity: Not on file  Lifestyle  . Physical activity:    Days per week: Not on file    Minutes per session: Not on file  . Stress: Not on file  Relationships  . Social connections:    Talks on phone: Not on file    Gets together: Not on file    Attends religious service: Not on file    Active member of club or organization: Not on file    Attends meetings of clubs or organizations: Not on file    Relationship status: Not on file  . Intimate partner violence:    Fear of current or ex partner: Not on file    Emotionally abused: Not on file      Physically abused: Not on file    Forced sexual activity: Not on file  Other Topics Concern  . Not on file  Social History Narrative   Lives with son who is mentally impaired.     She is temporarily in [facility] for rehab.   Right-handed.   No caffeine use.    Allergies  Allergen Reactions  . Fluoxetine Hcl Other (See Comments)     vomiting  . Hydrocodone     Nausea and vomiting   . Shellfish Allergy Nausea And Vomiting    Just feels "sick'  . Diclofenac Sodium Other (See Comments)    : vomiting    Family History  Problem Relation Age of Onset  . Kidney failure Mother   . Stroke Father   . Cancer Brother        lung    Prior to Admission medications   Medication Sig Start Date End Date Taking? Authorizing Provider  acetaminophen (TYLENOL) 500 MG tablet Take 1,000 mg by mouth every 4 (four) hours as needed.   Yes [provider]  aspirin 325 MG tablet Take 325 mg by mouth daily.    Yes [provider]  calcium carbonate (OS-CAL) 600 MG TABS tablet Take 600 mg by mouth daily with breakfast.    Yes [provider]  ibuprofen (ADVIL,MOTRIN) 200 MG tablet Take 600 mg by mouth every 6 (six) hours as needed for moderate pain.   Yes [provider]  metoprolol tartrate (LOPRESSOR) 50 MG tablet TAKE TWO TABLETS BY MOUTH TWICE DAILY 05/17/17  Yes Tower, Wynelle Fanny, MD  senna-docusate (SENNA-S) 8.6-50 MG tablet TAKE TWO (2) TABLETS BY MOUTH 2 TIMES DAILY FOR CONSTIPATION 01/26/16  Yes Pleas Koch, NP  vitamin C (ASCORBIC ACID) 500 MG tablet Take 500 mg by mouth daily.    Yes [provider]    Physical Exam: Vitals:   07/29/17 0930 07/29/17 0945 07/29/17 1000 07/29/17 1430  BP: (!) 207/79 (!) 214/82 (!) 208/95 (!) 196/82  Pulse: 86 94 82   Resp: (!) 22 20 18  (!) 27  Temp:      TempSrc:      SpO2: 99% 98% 96%   Weight:      Height:         General:  Appears somewhat frail somewhat pale appearing somewhat anxious but in  no acute distress Eyes:  PERRL, EOMI, normal lids, iris ENT:  grossly normal hearing, lips & tongue, mucous membranes of her mouth are slightly pale somewhat dry Neck:  no LAD, masses or thyromegaly Cardiovascular:  RRR, no m/r/g. No LE edema.  Respiratory:  Normal effort somewhat shallow breath sounds slightly distant hear no crackles no wheezes Abdomen:  soft,  ntnd, is a bowel sounds throughout no guarding or rebounding Skin:  no rash or induration seen on limited exam Musculoskeletal:  grossly normal tone BUE/BLE, good ROM, no bony abnormality Psychiatric:  grossly normal mood and affect, speech fluent and appropriate, AOx3 Neurologic:  Alert oriented to self only speech clear moving all extremities spontaneously bilateral grip 5 out of 5  Labs on Admission: I have personally reviewed following labs and imaging studies  CBC: Recent Labs  Lab 07/28/17 1247 07/29/17 0950 07/29/17 1012  WBC 8.6 14.4*  --   NEUTROABS  --  11.2*  --   HGB 13.4 13.2 15.0  HCT 40.1 39.7 44.0  MCV 88.5 87.4  --   PLT 217.0 174  --    Basic Metabolic Panel: Recent Labs  Lab 07/28/17 1247 07/29/17 0950 07/29/17 1012  NA 132* 133* 133*  K 4.1 3.9 4.0  CL 96 96* 98*  CO2 26 21*  --   GLUCOSE 145* 128* 124*  BUN 25* 29* 31*  CREATININE 1.69* 2.16* 2.10*  CALCIUM 10.6* 10.5*  --    GFR: Estimated Creatinine Clearance: 21.5 mL/min (A) (by C-G formula based on SCr of 2.1 mg/dL (H)). Liver Function Tests: Recent Labs  Lab 07/28/17 1247 07/29/17 0950  AST 29 59*  ALT 12 18  ALKPHOS 54 57  BILITOT 0.7 1.0  PROT 8.5* 9.0*  ALBUMIN 4.3 4.2   No results for input(s): LIPASE, AMYLASE in the last 168 hours. No results for input(s): AMMONIA in the last 168 hours. Coagulation Profile: No results for input(s): INR, PROTIME in the last 168 hours. Cardiac Enzymes: No results for input(s): CKTOTAL, CKMB, CKMBINDEX, TROPONINI in the last 168 hours. BNP (last 3 results) No results for input(s):  PROBNP in the last 8760 hours. HbA1C: No results for input(s): HGBA1C in the last 72 hours. CBG: Recent Labs  Lab 07/29/17 0957  GLUCAP 103*   Lipid Profile: No results for input(s): CHOL, HDL, LDLCALC, TRIG, CHOLHDL, LDLDIRECT in the last 72 hours. Thyroid Function Tests: No results for input(s): TSH, T4TOTAL, FREET4, T3FREE, THYROIDAB in the last 72 hours. Anemia Panel: Recent Labs    07/28/17 1247  VITAMINB12 712   Urine analysis:    Component Value Date/Time   COLORURINE YELLOW 07/29/2017 1206   APPEARANCEUR HAZY (A) 07/29/2017 1206   LABSPEC 1.009 07/29/2017 1206   PHURINE 6.0 07/29/2017 1206   GLUCOSEU NEGATIVE 07/29/2017 1206   HGBUR LARGE (A) 07/29/2017 1206   HGBUR large 03/18/2009 1613   BILIRUBINUR NEGATIVE 07/29/2017 1206   BILIRUBINUR Negative 03/11/2017 1244   KETONESUR 5 (A) 07/29/2017 1206   PROTEINUR 100 (A) 07/29/2017 1206   UROBILINOGEN 0.2 03/11/2017 1244   UROBILINOGEN 0.2 01/03/2014 2002   NITRITE NEGATIVE 07/29/2017 1206   LEUKOCYTESUR NEGATIVE 07/29/2017 1206    Creatinine Clearance: Estimated Creatinine Clearance: 21.5 mL/min (A) (by C-G formula based on SCr of 2.1 mg/dL (H)).  Sepsis Labs: @LABRCNTIP (procalcitonin:4,lacticidven:4) )No results found for this or any previous visit (from the past 240 hour(s)).   Radiological Exams on Admission: Dg Chest 2 View  Result Date: 07/29/2017 CLINICAL DATA:  Altered mental status. EXAM: CHEST - 2 VIEW COMPARISON:  08/25/2015. FINDINGS: Mediastinum hilar structures normal. Heart size normal. Left base. No pleural effusion or pneumothorax. Degenerative change thoracic spine. IMPRESSION: Left base atelectasis. Electronically Signed   By: Marcello Moores  Register   On: 07/29/2017 10:54   Ct Head Wo Contrast  Result Date: 07/29/2017 CLINICAL DATA:  Cervical spine trauma. EXAM: CT  HEAD WITHOUT CONTRAST CT CERVICAL SPINE WITHOUT CONTRAST TECHNIQUE: Multidetector CT imaging of the head and cervical spine was  performed following the standard protocol without intravenous contrast. Multiplanar CT image reconstructions of the cervical spine were also generated. COMPARISON:  None. FINDINGS: CT HEAD FINDINGS Brain: Mild chronic ischemic white matter disease is noted. No mass effect or midline shift is noted. Ventricular size is within normal limits. There is no evidence of mass lesion, hemorrhage or acute infarction. Vascular: No hyperdense vessel or unexpected calcification. Skull: Normal. Negative for fracture or focal lesion. Sinuses/Orbits: No acute finding. Other: None. CT CERVICAL SPINE FINDINGS Alignment: Normal. Skull base and vertebrae: No acute fracture. No primary bone lesion or focal pathologic process. Soft tissues and spinal canal: No prevertebral fluid or swelling. No visible canal hematoma. Disc levels: Mild degenerative disc disease is noted at C5-6. Moderate degenerative disc disease is noted at C6-7 with anterior osteophyte formation. Upper chest: Biapical scarring is noted. Other: Degenerative changes seen involving posterior facet joints bilaterally. IMPRESSION: Mild chronic ischemic white matter disease. No acute intracranial abnormality seen. Multilevel degenerative disc disease is noted in the cervical spine. No fracture or spondylolisthesis is noted. Electronically Signed   By: Marijo Conception, M.D.   On: 07/29/2017 11:03   Ct Cervical Spine Wo Contrast  Result Date: 07/29/2017 CLINICAL DATA:  Cervical spine trauma. EXAM: CT HEAD WITHOUT CONTRAST CT CERVICAL SPINE WITHOUT CONTRAST TECHNIQUE: Multidetector CT imaging of the head and cervical spine was performed following the standard protocol without intravenous contrast. Multiplanar CT image reconstructions of the cervical spine were also generated. COMPARISON:  None. FINDINGS: CT HEAD FINDINGS Brain: Mild chronic ischemic white matter disease is noted. No mass effect or midline shift is noted. Ventricular size is within normal limits. There is no  evidence of mass lesion, hemorrhage or acute infarction. Vascular: No hyperdense vessel or unexpected calcification. Skull: Normal. Negative for fracture or focal lesion. Sinuses/Orbits: No acute finding. Other: None. CT CERVICAL SPINE FINDINGS Alignment: Normal. Skull base and vertebrae: No acute fracture. No primary bone lesion or focal pathologic process. Soft tissues and spinal canal: No prevertebral fluid or swelling. No visible canal hematoma. Disc levels: Mild degenerative disc disease is noted at C5-6. Moderate degenerative disc disease is noted at C6-7 with anterior osteophyte formation. Upper chest: Biapical scarring is noted. Other: Degenerative changes seen involving posterior facet joints bilaterally. IMPRESSION: Mild chronic ischemic white matter disease. No acute intracranial abnormality seen. Multilevel degenerative disc disease is noted in the cervical spine. No fracture or spondylolisthesis is noted. Electronically Signed   By: Marijo Conception, M.D.   On: 07/29/2017 11:03    EKG: Sinus rhythm Probable anteroseptal infarct, old Nonspecific repol abnormality, diffuse leads No significant change since last tracing   Assessment/Plan Principal Problem:   Acute encephalopathy Active Problems:   Hyperlipidemia LDL goal <100   Essential hypertension   Acute renal failure superimposed on stage 2 chronic kidney disease (HCC)   UTI (urinary tract infection)   Chronic headache   Elevated lactic acid level   Hypercalcemia   Elevated troponin   #1. Acute encephalopathy. Likely multifactorial specifically dehydration mild metabolic derangement in the setting of possible infectious process. Neurol exam without focal deficits. CT of the head no acute abnormality. Urine concerning for UTI. chest x-ray without infiltrate. She does have a mild leukocytosis and elevated lactic acid related to dehydration she is provided with IV antibiotics and IV fluids in the emergency department. At the time of  admission  lactic acid trending down patient more alert -Admit to telemetry -continue IV fluids -follow CK -Follow urine culture -Continue antibiotics for now -Track lactic acid -Monitor urine output -Obtain a B-12 and folate  #2. Elevated troponin. Likely related to demand ischemia.  No chest pain. ekg as noted above -cycle troponin -obtain echo  3.hypercalcemia. May be concentrated. Home meds include supplement -iv fluids -hold home supplement -recheck in am  #4.  Acute Renal failure superimposed on chronic kidney disease stage II.creatinine 2.10 on admission. Likely related to decreased oral intake -Hold nephrotoxins -IV fluids as noted above -Monitor urine output -Recheck in the morning  #5. Urinary tract infection. Urinalysis as noted above. -Follow urine culture -adjust antibiotics once culture back -IV fluids  #6. Hypertension.blood pressure elevated. Home medications include metoprolol -Continue home meds -When necessary hydralazine -Monitor   DVT prophylaxis: heparin  Code Status: dnr  Family Communication: niece at bedside  Disposition Plan: may need placement  Consults called: none  Admission status: inpatient.    Radene Gunning MD [facility]  If 7PM-7AM, please contact night-coverage [email or web address] Password [email or web address]  07/29/2017, 3:06 PM

## 2017-07-29 NOTE — Progress Notes (Signed)
CRITICAL VALUE ALERT  Critical Value:  Troponin 0.40  Date & Time Notified:  07/29/17 2015  Provider Notified:  X. Blount  Orders Received/Actions taken:  No new orders received

## 2017-07-30 ENCOUNTER — Inpatient Hospital Stay (HOSPITAL_COMMUNITY): Payer: Medicare Other

## 2017-07-30 DIAGNOSIS — R0600 Dyspnea, unspecified: Secondary | ICD-10-CM

## 2017-07-30 DIAGNOSIS — G92 Toxic encephalopathy: Secondary | ICD-10-CM

## 2017-07-30 DIAGNOSIS — N189 Chronic kidney disease, unspecified: Secondary | ICD-10-CM

## 2017-07-30 DIAGNOSIS — N390 Urinary tract infection, site not specified: Principal | ICD-10-CM

## 2017-07-30 LAB — CBC
HEMATOCRIT: 38.3 % (ref 36.0–46.0)
Hemoglobin: 12.7 g/dL (ref 12.0–15.0)
MCH: 29 pg (ref 26.0–34.0)
MCHC: 33.2 g/dL (ref 30.0–36.0)
MCV: 87.4 fL (ref 78.0–100.0)
Platelets: 188 10*3/uL (ref 150–400)
RBC: 4.38 MIL/uL (ref 3.87–5.11)
RDW: 14.6 % (ref 11.5–15.5)
WBC: 12.7 10*3/uL — AB (ref 4.0–10.5)

## 2017-07-30 LAB — COMPREHENSIVE METABOLIC PANEL
ALBUMIN: 3.8 g/dL (ref 3.5–5.0)
ALK PHOS: 51 U/L (ref 38–126)
ALT: 24 U/L (ref 14–54)
AST: 81 U/L — AB (ref 15–41)
Anion gap: 11 (ref 5–15)
BILIRUBIN TOTAL: 1.2 mg/dL (ref 0.3–1.2)
BUN: 27 mg/dL — AB (ref 6–20)
CALCIUM: 9.3 mg/dL (ref 8.9–10.3)
CO2: 20 mmol/L — ABNORMAL LOW (ref 22–32)
Chloride: 99 mmol/L — ABNORMAL LOW (ref 101–111)
Creatinine, Ser: 1.53 mg/dL — ABNORMAL HIGH (ref 0.44–1.00)
GFR calc Af Amer: 34 mL/min — ABNORMAL LOW (ref >60)
GFR calc non Af Amer: 29 mL/min — ABNORMAL LOW (ref >60)
GLUCOSE: 121 mg/dL — AB (ref 65–99)
Potassium: 3.6 mmol/L (ref 3.5–5.1)
Sodium: 130 mmol/L — ABNORMAL LOW (ref 135–145)
TOTAL PROTEIN: 8.5 g/dL — AB (ref 6.5–8.1)

## 2017-07-30 LAB — ECHOCARDIOGRAM COMPLETE
Height: 67 in
WEIGHTICAEL: 3088 [oz_av]

## 2017-07-30 LAB — TROPONIN I
TROPONIN I: 0.32 ng/mL — AB (ref <0.03)
Troponin I: 0.2 ng/mL (ref <0.03)

## 2017-07-30 LAB — GLUCOSE, CAPILLARY
Glucose-Capillary: 126 mg/dL — ABNORMAL HIGH (ref 65–99)
Glucose-Capillary: 166 mg/dL — ABNORMAL HIGH (ref 65–99)
Glucose-Capillary: 142 mg/dL — ABNORMAL HIGH (ref 65–99)
Glucose-Capillary: 171 mg/dL — ABNORMAL HIGH (ref 65–99)

## 2017-07-30 MED ORDER — SODIUM CHLORIDE 0.9 % IV SOLN
1.0000 g | INTRAVENOUS | Status: DC
  Administered 2017-07-30 – 2017-08-01 (×3): 1 g via INTRAVENOUS
  Filled 2017-07-30 (×4): qty 10

## 2017-07-30 MED ORDER — AMLODIPINE BESYLATE 5 MG PO TABS
5.0000 mg | ORAL_TABLET | Freq: Every day | ORAL | Status: DC
  Administered 2017-07-30: 5 mg via ORAL
  Filled 2017-07-30: qty 1

## 2017-07-30 MED ORDER — SODIUM CHLORIDE 0.9 % IV SOLN
INTRAVENOUS | Status: AC
  Administered 2017-07-30: 14:00:00 via INTRAVENOUS

## 2017-07-30 MED ORDER — METOPROLOL TARTRATE 50 MG PO TABS
50.0000 mg | ORAL_TABLET | Freq: Two times a day (BID) | ORAL | Status: DC
  Administered 2017-07-30 – 2017-08-02 (×6): 50 mg via ORAL
  Filled 2017-07-30 (×6): qty 1

## 2017-07-30 NOTE — Evaluation (Addendum)
Physical Therapy Evaluation Patient Details Name: Erin [name] MRN: [id number] DOB: 1928/09/28 Today's Date: 07/30/2017   History of Present Illness  84 year old female patient with PMH of DM 2, HLD, HTN, stage III chronic kidney disease, colon cancer, dementia, found by niece on the floor on morning of admission, conscious but confused, reduced oral intake for few weeks, admitted for acute encephalopathy in the setting of dehydration, hypercalcemia and possible UTI.  Clinical Impression  Pt with some confusion and difficulty with word finding when asked about PLOF and Home Living. Pt reports having HHA 5 hrs/day for light house work and cooking, pt reports independence with ADLs, and ability to ambulate with RW limited community distances. Pt limited in her mobility by increased dizziness with movement in the presence of increased BP (see General Comments). PT recommends SNF level rehab at this point, however once BP is better managed my progress to HHPT. PT will continue to follow acutely.     Follow Up Recommendations SNF    Equipment Recommendations  (TBD)    Recommendations for Other Services OT consult     Precautions / Restrictions Precautions Precautions: Fall Precaution Comments: s/p fall  Restrictions Weight Bearing Restrictions: No      Mobility  Bed Mobility Overal bed mobility: Needs Assistance Bed Mobility: Supine to Sit;Sit to Supine     Supine to sit: Min assist Sit to supine: Min assist   General bed mobility comments: pt became very dizzy sitting EoB and quickly laid back down. pt required minA for management of LE,        Pertinent Vitals/Pain Pain Assessment: 0-10    Home Living Family/patient expects to be discharged to:: Private residence Living Arrangements: Children(son ) Available Help at Discharge: Personal care attendant;Family;Available PRN/intermittently Type of Home: House Home Access: Ramped entrance     Home Layout: One level Home  Equipment: Walker - 2 wheels      Prior Function Level of Independence: Needs assistance      ADL's / Homemaking Assistance Needed: HHA 5 hrs/day           Extremity/Trunk Assessment   Upper Extremity Assessment Upper Extremity Assessment: Defer to OT evaluation    Lower Extremity Assessment Lower Extremity Assessment: (unable to fully assess due to dizziness with coming to side )       Communication   Communication: No difficulties  Cognition Arousal/Alertness: Awake/alert Behavior During Therapy: WFL for tasks assessed/performed Overall Cognitive Status: No family/caregiver present to determine baseline cognitive functioning                                        General Comments General comments (skin integrity, edema, etc.): at rest HR 66 bpm, with coming EoB HR 77bpm and pt SoB, once back in bed BP 170/70, HR 63 bpm, after 5 minutes supine BP 178/73, HR 55 bpm, Nursing notified        Assessment/Plan    PT Assessment Patient needs continued PT services  PT Problem List Decreased activity tolerance;Decreased mobility;Decreased cognition;Cardiopulmonary status limiting activity       PT Treatment Interventions Gait training;DME instruction;Functional mobility training;Therapeutic activities;Therapeutic exercise;Balance training;Cognitive remediation;Patient/family education    PT Goals (Current goals can be found in the Care Plan section)  Acute Rehab PT Goals Patient Stated Goal: feel better PT Goal Formulation: With patient Time For Goal Achievement: 08/13/17 Potential to Achieve Goals: Fair  Frequency Min 3X/week    AM-PAC PT "6 Clicks" Daily Activity  Outcome Measure Difficulty turning over in bed (including adjusting bedclothes, sheets and blankets)?: Unable Difficulty moving from lying on back to sitting on the side of the bed? : Unable Difficulty sitting down on and standing up from a chair with arms (e.g., wheelchair, bedside  commode, etc,.)?: Unable Help needed moving to and from a bed to chair (including a wheelchair)?: Total Help needed walking in hospital room?: Total Help needed climbing 3-5 steps with a railing? : Total 6 Click Score: 6    End of Session   Activity Tolerance: Other (comment)(limited by dizziness) Patient left: in bed;with call bell/phone within reach;with bed alarm set Nurse Communication: Mobility status;Other (comment)(Dizziness and elevated BP) PT Visit Diagnosis: Dizziness and giddiness (R42);Other symptoms and signs involving the nervous system (R29.898);Other abnormalities of gait and mobility (R26.89);Muscle weakness (generalized) (M62.81)    Time: 9030-0923 PT Time Calculation (min) (ACUTE ONLY): 31 min   Charges:   PT Evaluation $PT Eval Moderate Complexity: 1 Mod PT Treatments $Therapeutic Activity: 8-22 mins   PT G Codes:        Amrit Erck B. Migdalia Dk PT, DPT Acute Rehabilitation  [phone] Pager [phone]    [place] 07/30/2017, 2:31 PM

## 2017-07-30 NOTE — Progress Notes (Signed)
  Echocardiogram 2D Echocardiogram has been performed.  Darlina Sicilian M 07/30/2017, 10:24 AM

## 2017-07-30 NOTE — Progress Notes (Signed)
Patient is taking 50mg  BID of metoprolol tartrate at home per updated PTA med list. D/W MD, will change to inpatient metoprolol to 50mg  PO BID per home dose.   Mazal Ebey A. Levada Dy, PharmD, [place] Pager: [phone]

## 2017-07-30 NOTE — Progress Notes (Addendum)
PROGRESS NOTE   Erin [name]  TSV:[id number]    DOB: 09/01/1928    DOA: 07/29/2017  PCP: Abner Greenspan, MD   I have briefly reviewed patients previous medical records in [facility], [place].  Brief Narrative:  84 year old female patient with PMH of DM 2, HLD, HTN, stage III chronic kidney disease, colon cancer, dementia, found by niece on the floor on morning of admission, conscious but confused, reduced oral intake for few weeks, admitted for acute encephalopathy in the setting of dehydration, hypercalcemia and possible UTI.   Assessment & Plan:   Principal Problem:   Acute encephalopathy Active Problems:   Hyperlipidemia LDL goal <100   Essential hypertension   Acute renal failure superimposed on stage 2 chronic kidney disease (HCC)   UTI (urinary tract infection)   Chronic headache   Elevated lactic acid level   Hypercalcemia   Elevated troponin   Gram-negative rod acute lower UTI: Preliminary urine culture shows >100,000 K colonies per mL of gram-negative rods.  She had been started on empiric IV Zosyn but previous urine culture results show UTIs with Klebsiella and E. coli sensitive to ceftriaxone.  Hence narrowed antibiotics to IV ceftriaxone on 4/20.  Acute toxic metabolic encephalopathy: Likely multifactorial due to UTI, dehydration with hyponatremia, acute on chronic kidney disease and mild hypercalcemia complicating underlying dementia.  No focal deficits on exam.  CT head without acute findings.  Chest x-ray without pneumonia.  Treating underlying cause.  Seems to be improving.  Monitor closely.  Dehydration with hyponatremia: Likely related to poor oral intake.  IV fluids.  Encourage oral intake.  Improving.  Hypercalcemia: Mild.  Due to dehydration.  Holding home supplements.  Resolved.  Acute on stage III chronic kidney disease: Baseline creatinine probably in the 1.2-1.3 range.  Creatinine peaked to 2.16 on 4/19.  Secondary to dehydration.  IV fluids.  Creatinine has  improved to 1.5.  Continue gentle hydration for another 24 hours and follow BMP.  Elevated troponin: Likely related to acute on chronic kidney disease, mild rhabdomyolysis related to fall and demand ischemia.  Downward trend.  No chest pain.  Follow echo for EF and wall motion abnormalities.  Essential hypertension: Uncontrolled.  Reduced metoprolol 50 mg twice daily is what she was taking at home as per d/w Pharmacy.  PRN IV hydralazine.  Remains elevated despite metoprolol.  Added amlodipine 5 mg daily.  DM 2: A1c 01/28/17: 6.  Reasonable inpatient control.     DVT prophylaxis: Subcutaneous heparin Code Status: DNR Family Communication: None at bedside Disposition: DC home pending further clinical improvement.   Consultants:  None  Procedures:  None  Antimicrobials:  IV Zosyn-discontinued. IV ceftriaxone 4/20 >.   Subjective: Denies complaints.  Denies pain or dysuria.  Reports feeling drowsy and hungry and wants to eat breakfast.  As per RN, oriented but just feeling tired.  No acute issues reported.  ROS: As above.  Objective:  Vitals:   07/30/17 0118 07/30/17 0550 07/30/17 0646 07/30/17 1040  BP: (!) 160/78 (!) 208/78 (!) 208/69 (!) 207/86  Pulse:  72  66  Resp:  18  16  Temp:  98.2 F (36.8 C)  98.8 F (37.1 C)  TempSrc:  Oral  Oral  SpO2:  96%  98%  Weight:      Height:        Examination:  General exam: Pleasant elderly female, moderately built and nourished lying comfortably propped up in bed.  Oral mucosa with borderline hydration. Respiratory system:  Clear to auscultation. Respiratory effort normal. Cardiovascular system: S1 & S2 heard, RRR. No JVD, murmurs, rubs, gallops or clicks. No pedal edema.  Telemetry personally reviewed: Sinus rhythm. Gastrointestinal system: Abdomen is nondistended, soft and nontender. No organomegaly or masses felt. Normal bowel sounds heard. Central nervous system: Alert and oriented to person, place and partly to time. No  focal neurological deficits. Extremities: Symmetric 5 x 5 power.  Right knee healed surgical scar. Skin: No rashes, lesions or ulcers Psychiatry: Judgement and insight appear impaired. Mood & affect appropriate.     Data Reviewed: I have personally reviewed following labs and imaging studies  CBC: Recent Labs  Lab 07/28/17 1247 07/29/17 0950 07/29/17 1012 07/30/17 0705  WBC 8.6 14.4*  --  12.7*  NEUTROABS  --  11.2*  --   --   HGB 13.4 13.2 15.0 12.7  HCT 40.1 39.7 44.0 38.3  MCV 88.5 87.4  --  87.4  PLT 217.0 174  --  427   Basic Metabolic Panel: Recent Labs  Lab 07/28/17 1247 07/29/17 0950 07/29/17 1012 07/29/17 1435 07/30/17 0705  NA 132* 133* 133*  --  130*  K 4.1 3.9 4.0  --  3.6  CL 96 96* 98*  --  99*  CO2 26 21*  --   --  20*  GLUCOSE 145* 128* 124*  --  121*  BUN 25* 29* 31*  --  27*  CREATININE 1.69* 2.16* 2.10* 1.89* 1.53*  CALCIUM 10.6* 10.5*  --   --  9.3   Liver Function Tests: Recent Labs  Lab 07/28/17 1247 07/29/17 0950 07/30/17 0705  AST 29 59* 81*  ALT 12 18 24   ALKPHOS 54 57 51  BILITOT 0.7 1.0 1.2  PROT 8.5* 9.0* 8.5*  ALBUMIN 4.3 4.2 3.8   Coagulation Profile: No results for input(s): INR, PROTIME in the last 168 hours. Cardiac Enzymes: Recent Labs  Lab 07/29/17 1435 07/29/17 1839 07/30/17 0058 07/30/17 0705  CKTOTAL 1,671*  --   --   --   TROPONINI  --  0.40* 0.32* 0.20*   CBG: Recent Labs  Lab 07/29/17 0957 07/29/17 2213 07/30/17 0740  GLUCAP 103* 153* 126*    Recent Results (from the past 240 hour(s))  Urine culture     Status: Abnormal (Preliminary result)   Collection Time: 07/29/17 12:06 PM  Result Value Ref Range Status   Specimen Description URINE, CATHETERIZED  Final   Special Requests   Final    NONE Performed at [facility], [place] [address] [place], [place] [address]    Culture >=100,000 COLONIES/mL GRAM NEGATIVE RODS (A)  Final   Report Status PENDING  Incomplete         Radiology  Studies: Dg Chest 2 View  Result Date: 07/29/2017 CLINICAL DATA:  Altered mental status. EXAM: CHEST - 2 VIEW COMPARISON:  08/25/2015. FINDINGS: Mediastinum hilar structures normal. Heart size normal. Left base. No pleural effusion or pneumothorax. Degenerative change thoracic spine. IMPRESSION: Left base atelectasis. Electronically Signed   By: Marcello Moores  Register   On: 07/29/2017 10:54   Ct Head Wo Contrast  Result Date: 07/29/2017 CLINICAL DATA:  Cervical spine trauma. EXAM: CT HEAD WITHOUT CONTRAST CT CERVICAL SPINE WITHOUT CONTRAST TECHNIQUE: Multidetector CT imaging of the head and cervical spine was performed following the standard protocol without intravenous contrast. Multiplanar CT image reconstructions of the cervical spine were also generated. COMPARISON:  None. FINDINGS: CT HEAD FINDINGS Brain: Mild chronic ischemic white matter disease is noted.  No mass effect or midline shift is noted. Ventricular size is within normal limits. There is no evidence of mass lesion, hemorrhage or acute infarction. Vascular: No hyperdense vessel or unexpected calcification. Skull: Normal. Negative for fracture or focal lesion. Sinuses/Orbits: No acute finding. Other: None. CT CERVICAL SPINE FINDINGS Alignment: Normal. Skull base and vertebrae: No acute fracture. No primary bone lesion or focal pathologic process. Soft tissues and spinal canal: No prevertebral fluid or swelling. No visible canal hematoma. Disc levels: Mild degenerative disc disease is noted at C5-6. Moderate degenerative disc disease is noted at C6-7 with anterior osteophyte formation. Upper chest: Biapical scarring is noted. Other: Degenerative changes seen involving posterior facet joints bilaterally. IMPRESSION: Mild chronic ischemic white matter disease. No acute intracranial abnormality seen. Multilevel degenerative disc disease is noted in the cervical spine. No fracture or spondylolisthesis is noted. Electronically Signed   By: Marijo Conception,  M.D.   On: 07/29/2017 11:03   Ct Cervical Spine Wo Contrast  Result Date: 07/29/2017 CLINICAL DATA:  Cervical spine trauma. EXAM: CT HEAD WITHOUT CONTRAST CT CERVICAL SPINE WITHOUT CONTRAST TECHNIQUE: Multidetector CT imaging of the head and cervical spine was performed following the standard protocol without intravenous contrast. Multiplanar CT image reconstructions of the cervical spine were also generated. COMPARISON:  None. FINDINGS: CT HEAD FINDINGS Brain: Mild chronic ischemic white matter disease is noted. No mass effect or midline shift is noted. Ventricular size is within normal limits. There is no evidence of mass lesion, hemorrhage or acute infarction. Vascular: No hyperdense vessel or unexpected calcification. Skull: Normal. Negative for fracture or focal lesion. Sinuses/Orbits: No acute finding. Other: None. CT CERVICAL SPINE FINDINGS Alignment: Normal. Skull base and vertebrae: No acute fracture. No primary bone lesion or focal pathologic process. Soft tissues and spinal canal: No prevertebral fluid or swelling. No visible canal hematoma. Disc levels: Mild degenerative disc disease is noted at C5-6. Moderate degenerative disc disease is noted at C6-7 with anterior osteophyte formation. Upper chest: Biapical scarring is noted. Other: Degenerative changes seen involving posterior facet joints bilaterally. IMPRESSION: Mild chronic ischemic white matter disease. No acute intracranial abnormality seen. Multilevel degenerative disc disease is noted in the cervical spine. No fracture or spondylolisthesis is noted. Electronically Signed   By: Marijo Conception, M.D.   On: 07/29/2017 11:03        Scheduled Meds: . aspirin  325 mg Oral Daily  . heparin  5,000 Units Subcutaneous Q8H  . metoprolol tartrate  100 mg Oral BID  . senna-docusate  1 tablet Oral BID  . vitamin C  500 mg Oral Daily   Continuous Infusions: . sodium chloride 75 mL/hr at 07/30/17 0619  . piperacillin-tazobactam (ZOSYN)  IV  Stopped (07/30/17 1025)     LOS: 1 day     Vernell Leep, MD, FACP, [facility]. [facility] Pager [phone]  If 7PM-7AM, please contact night-coverage [email or web address] Password [facility] 07/30/2017, 12:45 PM

## 2017-07-30 NOTE — Progress Notes (Signed)
Pt BP 207/86, given PRN hydralazine 5mg  and daily metoprolol tablet 100mg , Rechecked BP 188/70. Pt resting in bed. Hongalgi, MD notified and made aware, waiting on orders. Will continue to monitor pt.

## 2017-07-31 LAB — COMPREHENSIVE METABOLIC PANEL
ALBUMIN: 3.3 g/dL — AB (ref 3.5–5.0)
ALK PHOS: 43 U/L (ref 38–126)
ALT: 22 U/L (ref 14–54)
AST: 58 U/L — AB (ref 15–41)
Anion gap: 10 (ref 5–15)
BILIRUBIN TOTAL: 0.8 mg/dL (ref 0.3–1.2)
BUN: 24 mg/dL — AB (ref 6–20)
CALCIUM: 8.9 mg/dL (ref 8.9–10.3)
CO2: 20 mmol/L — ABNORMAL LOW (ref 22–32)
Chloride: 100 mmol/L — ABNORMAL LOW (ref 101–111)
Creatinine, Ser: 1.22 mg/dL — ABNORMAL HIGH (ref 0.44–1.00)
GFR calc Af Amer: 45 mL/min — ABNORMAL LOW (ref >60)
GFR calc non Af Amer: 39 mL/min — ABNORMAL LOW (ref >60)
GLUCOSE: 124 mg/dL — AB (ref 65–99)
POTASSIUM: 3.4 mmol/L — AB (ref 3.5–5.1)
Sodium: 130 mmol/L — ABNORMAL LOW (ref 135–145)
TOTAL PROTEIN: 7.1 g/dL (ref 6.5–8.1)

## 2017-07-31 LAB — URINE CULTURE

## 2017-07-31 LAB — GLUCOSE, CAPILLARY
GLUCOSE-CAPILLARY: 126 mg/dL — AB (ref 65–99)
Glucose-Capillary: 127 mg/dL — ABNORMAL HIGH (ref 65–99)
Glucose-Capillary: 101 mg/dL — ABNORMAL HIGH (ref 65–99)
Glucose-Capillary: 121 mg/dL — ABNORMAL HIGH (ref 65–99)

## 2017-07-31 LAB — CK: Total CK: 933 U/L — ABNORMAL HIGH (ref 38–234)

## 2017-07-31 MED ORDER — CLONIDINE HCL 0.1 MG PO TABS
0.1000 mg | ORAL_TABLET | Freq: Once | ORAL | Status: AC
  Administered 2017-07-31: 0.1 mg via ORAL
  Filled 2017-07-31: qty 1

## 2017-07-31 MED ORDER — AMLODIPINE BESYLATE 10 MG PO TABS
10.0000 mg | ORAL_TABLET | Freq: Every day | ORAL | Status: DC
  Administered 2017-07-31 – 2017-08-02 (×3): 10 mg via ORAL
  Filled 2017-07-31 (×3): qty 1

## 2017-07-31 MED ORDER — POTASSIUM CHLORIDE CRYS ER 20 MEQ PO TBCR
40.0000 meq | EXTENDED_RELEASE_TABLET | Freq: Once | ORAL | Status: AC
  Administered 2017-07-31: 40 meq via ORAL
  Filled 2017-07-31: qty 2

## 2017-07-31 NOTE — Progress Notes (Signed)
Pt BP 209/53, MAP 100, Pulse 66. Administered PRN hydralazine for BP 188/68 1 hour ago. Notified Blount, NP. Awaiting new orders, will continue to monitor.

## 2017-07-31 NOTE — Progress Notes (Signed)
PROGRESS NOTE   Erin [name]  ASN:[id number]    DOB: 28-Dec-1928    DOA: 07/29/2017  PCP: Abner Greenspan, MD   I have briefly reviewed patients previous medical records in [facility]  Brief Narrative:  84 year old female patient with PMH of DM 2, HLD, HTN, stage III chronic kidney disease, colon cancer, dementia, found by niece on the floor on morning of admission, conscious but confused, reduced oral intake for few weeks, admitted for acute encephalopathy in the setting of dehydration, hypercalcemia and possible UTI.   Assessment & Plan:   Principal Problem:   Acute encephalopathy Active Problems:   Hyperlipidemia LDL goal <100   Essential hypertension   Acute renal failure superimposed on stage 2 chronic kidney disease (HCC)   UTI (urinary tract infection)   Chronic headache   Elevated lactic acid level   Hypercalcemia   Elevated troponin   Klebsiella pneumonia acute lower UTI, recurrent: She had been started on empiric IV Zosyn and was narrowed to IV ceftriaxone on 4/20.  Day 3 IV antibiotics today.  Discussed with patient/family and encourage local hygiene.  Consider transitioning to oral antibiotics in a.m. to complete total 7 days treatment.  Acute toxic metabolic encephalopathy: Likely multifactorial due to UTI, dehydration with hyponatremia, acute on chronic kidney disease and mild hypercalcemia complicating underlying dementia.  No focal deficits on exam.  CT head without acute findings.  Chest x-ray without pneumonia.  Treating underlying cause.  Seems to be improving.  As per discussion with patient's niece, mental status almost back to baseline upon her visit 4/20.  Dehydration with hyponatremia: Likely related to poor oral intake.  IV fluids.  Encourage oral intake.  Improving.  DC IV fluids.  Hypercalcemia: Mild.  Due to dehydration.  Holding home supplements.  Resolved.  Acute on stage III chronic kidney disease: Baseline creatinine probably in the 1.2-1.3  range.  Creatinine peaked to 2.16 on 4/19.  Secondary to dehydration.  IV fluids.  Creatinine has improved to 1. 22.  DC IV fluids.  Elevated troponin: Likely related to acute on chronic kidney disease, mild rhabdomyolysis related to fall and demand ischemia.  Downward trend.  No chest pain.  TTE 4/20: LVEF 55-60%, normal wall motion and no regional wall motion abnormalities and grade 1 diastolic dysfunction.  Essential hypertension: Uncontrolled.  Reduced metoprolol 50 mg twice daily is what she was taking at home as per d/w Pharmacy.  PRN IV hydralazine.  Remains elevated despite metoprolol.  Added and increased amlodipine to 10 mg daily.  Better.  Continue to monitor closely.  DM 2: A1c 01/28/17: 6.  Reasonable inpatient control.  Hypokalemia: Replace and follow.  Mild rhabdomyolysis: Improving.     DVT prophylaxis: Subcutaneous heparin Code Status: DNR Family Communication: Discussed in detail with patient's niece, updated care and answered questions. Disposition: DC SNF pending bed availability, possibly 4/22.  Social work consulted.   Consultants:  None  Procedures:  None  Antimicrobials:  IV Zosyn-discontinued. IV ceftriaxone 4/20 >.   Subjective: Sleeping this morning but easily arousable and oriented x2.  Denies complaints.  No dysuria or pain.  As per RN, no acute issues noted.  ROS: As above.  Objective:  Vitals:   07/31/17 0619 07/31/17 0637 07/31/17 0914 07/31/17 1153  BP: (!) 209/53 (!) 182/58 (!) 164/63 (!) 151/63  Pulse: 66 67 68 65  Resp:  18  20  Temp:  98.3 F (36.8 C)  98.6 F (37 C)  TempSrc:  Oral  Oral  SpO2:  94%  100%  Weight:      Height:        Examination:  General exam: Pleasant elderly female, moderately built and nourished lying comfortably propped up in bed.  Oral mucosa moist. Respiratory system: Clear to auscultation. Respiratory effort normal.  Stable. Cardiovascular system: S1 & S2 heard, RRR. No JVD, murmurs, rubs, gallops  or clicks. No pedal edema.  Telemetry personally reviewed: Sinus rhythm. Gastrointestinal system: Abdomen is nondistended, soft and nontender. No organomegaly or masses felt. Normal bowel sounds heard.  Stable. Central nervous system: Mental status as above. No focal neurological deficits. Extremities: Symmetric 5 x 5 power.  Right knee healed surgical scar. Skin: No rashes, lesions or ulcers Psychiatry: Judgement and insight appear impaired. Mood & affect appropriate.     Data Reviewed: I have personally reviewed following labs and imaging studies  CBC: Recent Labs  Lab 07/28/17 1247 07/29/17 0950 07/29/17 1012 07/30/17 0705  WBC 8.6 14.4*  --  12.7*  NEUTROABS  --  11.2*  --   --   HGB 13.4 13.2 15.0 12.7  HCT 40.1 39.7 44.0 38.3  MCV 88.5 87.4  --  87.4  PLT 217.0 174  --  778   Basic Metabolic Panel: Recent Labs  Lab 07/28/17 1247 07/29/17 0950 07/29/17 1012 07/29/17 1435 07/30/17 0705 07/31/17 0653  NA 132* 133* 133*  --  130* 130*  K 4.1 3.9 4.0  --  3.6 3.4*  CL 96 96* 98*  --  99* 100*  CO2 26 21*  --   --  20* 20*  GLUCOSE 145* 128* 124*  --  121* 124*  BUN 25* 29* 31*  --  27* 24*  CREATININE 1.69* 2.16* 2.10* 1.89* 1.53* 1.22*  CALCIUM 10.6* 10.5*  --   --  9.3 8.9   Liver Function Tests: Recent Labs  Lab 07/28/17 1247 07/29/17 0950 07/30/17 0705 07/31/17 0653  AST 29 59* 81* 58*  ALT 12 18 24 22   ALKPHOS 54 57 51 43  BILITOT 0.7 1.0 1.2 0.8  PROT 8.5* 9.0* 8.5* 7.1  ALBUMIN 4.3 4.2 3.8 3.3*   Coagulation Profile: No results for input(s): INR, PROTIME in the last 168 hours. Cardiac Enzymes: Recent Labs  Lab 07/29/17 1435 07/29/17 1839 07/30/17 0058 07/30/17 0705 07/31/17 0653  CKTOTAL 1,671*  --   --   --  933*  TROPONINI  --  0.40* 0.32* 0.20*  --    CBG: Recent Labs  Lab 07/30/17 1252 07/30/17 1739 07/30/17 2035 07/31/17 0742 07/31/17 1150  GLUCAP 166* 142* 171* 127* 101*    Recent Results (from the past 240 hour(s))    Blood culture (routine x 2)     Status: None (Preliminary result)   Collection Time: 07/29/17 11:00 AM  Result Value Ref Range Status   Specimen Description BLOOD LEFT FOREARM  Final   Special Requests   Final    BOTTLES DRAWN AEROBIC AND ANAEROBIC Blood Culture adequate volume   Culture   Final    NO GROWTH 2 DAYS Performed at [facility], [place] [address] [place], [place] [address]    Report Status PENDING  Incomplete  Blood culture (routine x 2)     Status: None (Preliminary result)   Collection Time: 07/29/17 11:07 AM  Result Value Ref Range Status   Specimen Description BLOOD RIGHT HAND  Final   Special Requests   Final    BOTTLES DRAWN AEROBIC ONLY Blood Culture  adequate volume   Culture   Final    NO GROWTH 2 DAYS Performed at [facility], [place] [address] [place], [place] [address]    Report Status PENDING  Incomplete  Urine culture     Status: Abnormal   Collection Time: 07/29/17 12:06 PM  Result Value Ref Range Status   Specimen Description URINE, CATHETERIZED  Final   Special Requests   Final    NONE Performed at [facility], [address], [place], [place] [address]    Culture >=100,000 COLONIES/mL KLEBSIELLA PNEUMONIAE (A)  Final   Report Status 07/31/2017 FINAL  Final   Organism ID, Bacteria KLEBSIELLA PNEUMONIAE (A)  Final      Susceptibility   Klebsiella pneumoniae - MIC*    AMPICILLIN >=32 RESISTANT Resistant     CEFAZOLIN <=4 SENSITIVE Sensitive     CEFTRIAXONE <=1 SENSITIVE Sensitive     CIPROFLOXACIN <=0.25 SENSITIVE Sensitive     GENTAMICIN <=1 SENSITIVE Sensitive     IMIPENEM <=0.25 SENSITIVE Sensitive     NITROFURANTOIN 64 INTERMEDIATE Intermediate     TRIMETH/SULFA <=20 SENSITIVE Sensitive     AMPICILLIN/SULBACTAM 4 SENSITIVE Sensitive     PIP/TAZO <=4 SENSITIVE Sensitive     Extended ESBL NEGATIVE Sensitive     * >=100,000 COLONIES/mL KLEBSIELLA PNEUMONIAE         Radiology Studies: No results  found.      Scheduled Meds: . amLODipine  10 mg Oral Daily  . aspirin  325 mg Oral Daily  . heparin  5,000 Units Subcutaneous Q8H  . metoprolol tartrate  50 mg Oral BID  . senna-docusate  1 tablet Oral BID  . vitamin C  500 mg Oral Daily   Continuous Infusions: . cefTRIAXone (ROCEPHIN)  IV Stopped (07/31/17 1442)     LOS: 2 days     Vernell Leep, MD, FACP, [facility]. [facility] Pager [phone]  If 7PM-7AM, please contact night-coverage [email or web address] Password [facility] 07/31/2017, 3:05 PM

## 2017-08-01 ENCOUNTER — Inpatient Hospital Stay (HOSPITAL_COMMUNITY): Payer: Medicare Other

## 2017-08-01 DIAGNOSIS — N183 Chronic kidney disease, stage 3 (moderate): Secondary | ICD-10-CM

## 2017-08-01 DIAGNOSIS — R1031 Right lower quadrant pain: Secondary | ICD-10-CM

## 2017-08-01 LAB — BASIC METABOLIC PANEL
ANION GAP: 9 (ref 5–15)
BUN: 34 mg/dL — ABNORMAL HIGH (ref 6–20)
CHLORIDE: 101 mmol/L (ref 101–111)
CO2: 22 mmol/L (ref 22–32)
Calcium: 9.4 mg/dL (ref 8.9–10.3)
Creatinine, Ser: 1.47 mg/dL — ABNORMAL HIGH (ref 0.44–1.00)
GFR calc Af Amer: 36 mL/min — ABNORMAL LOW (ref >60)
GFR, EST NON AFRICAN AMERICAN: 31 mL/min — AB (ref >60)
GLUCOSE: 112 mg/dL — AB (ref 65–99)
POTASSIUM: 3.8 mmol/L (ref 3.5–5.1)
Sodium: 132 mmol/L — ABNORMAL LOW (ref 135–145)

## 2017-08-01 LAB — CK: Total CK: 588 U/L — ABNORMAL HIGH (ref 38–234)

## 2017-08-01 LAB — FOLATE RBC
FOLATE, HEMOLYSATE: 502.2 ng/mL
FOLATE, RBC: 1346 ng/mL (ref >498)
HEMATOCRIT: 37.3 % (ref 34.0–46.6)

## 2017-08-01 LAB — MAGNESIUM: MAGNESIUM: 2.1 mg/dL (ref 1.7–2.4)

## 2017-08-01 MED ORDER — TRAMADOL HCL 50 MG PO TABS
50.0000 mg | ORAL_TABLET | Freq: Four times a day (QID) | ORAL | Status: DC | PRN
Start: 2017-08-01 — End: 2017-08-02
  Administered 2017-08-01 (×3): 50 mg via ORAL
  Filled 2017-08-01 (×3): qty 1

## 2017-08-01 NOTE — Progress Notes (Signed)
Patient is feeling much better-had been in lots of pain.  Had a great visit. She did want to pray and mentioned concerns for her son at home-adult (mentally challenged) She does not like the word "retarded".  She does have family who check on him and he is ok but she still thinks about him because he is at home alone without her there.  Prayer for her to continue to heal and to have peace to know God is caring for her son while she is here and she doesn't have to be worried about him. Sweet woman and she bragged about the excellent care the staff is giving her. She is glad to get the care here that we do so well. Conard Novak, Chaplain   08/01/17 1500  Clinical Encounter Type  Visited With Patient  Visit Type Initial;Spiritual support;Social support  Referral From Nurse  Consult/Referral To Chaplain  Spiritual Encounters  Spiritual Needs Prayer;Emotional  Stress Factors  Patient Stress Factors Other (Comment) (has a son at home still in her care mentally challenged)  Family Stress Factors None identified

## 2017-08-01 NOTE — Progress Notes (Signed)
CSW received consult regarding PT recommendation of SNF at discharge.  Patient's niece, [name], is refusing SNF. She would like for patient to return home at discharge. She has a caregiver in the home during the day while she is at work and then either she or her sister are home with the patient at night. She states she would like home health since they have had that before but cannot remember the name of the company (patient went to SNF before and had home health set up afterwards). She states she has all equipment except possible a wheelchair. CSW will alert RNCM.     CSW signing off.   Percell Locus Dessirae Scarola LCSW [phone]

## 2017-08-01 NOTE — Progress Notes (Signed)
Patient trying to ambulate using a walker. She is very weak and unsteady on her feet. She was able to stand up with 2 assist and walked only 3-4 feet from the bed to the sink. From the sink staff took her back to the bed with a chair. Will continue to monitor.

## 2017-08-01 NOTE — Progress Notes (Signed)
PROGRESS NOTE   Erin [name]  KYH:[id number]    DOB: [birth year]    DOA: 07/29/2017  PCP: Abner Greenspan, MD   I have briefly reviewed patients previous medical records in [facility].  Brief Narrative:  84 year old female patient with PMH of DM 2, HLD, HTN, stage III chronic kidney disease, colon cancer, dementia, found by niece on the floor on morning of admission, conscious but confused, reduced oral intake for few weeks, admitted for acute encephalopathy in the setting of dehydration, hypercalcemia and possible UTI.  She was improving but on 4/22, reported significant pain in her right groin.  X-rays negative for acute findings.  Pain management and monitor.  Patient/family declining SNF.  DC home pending improvement.   Assessment & Plan:   Principal Problem:   Acute encephalopathy Active Problems:   Hyperlipidemia LDL goal <100   Essential hypertension   Acute renal failure superimposed on stage 2 chronic kidney disease (HCC)   UTI (urinary tract infection)   Chronic headache   Elevated lactic acid level   Hypercalcemia   Elevated troponin   Klebsiella pneumonia acute lower UTI, recurrent: She had been started on empiric IV Zosyn and was narrowed to IV ceftriaxone on 4/20.  Day 4 IV antibiotics today.  Discussed with patient/family and encouraged local hygiene.  Consider transitioning to oral antibiotics (Keflex 500 mg twice daily) on discharge possibly 4/23 and complete total 7 days treatment.  Acute toxic metabolic encephalopathy: Likely multifactorial due to UTI, dehydration with hyponatremia, acute on chronic kidney disease and mild hypercalcemia complicating underlying dementia.  No focal deficits on exam.  CT head without acute findings.  Chest x-ray without pneumonia.  Treated underlying cause.  Seems to have resolved.  Dehydration with hyponatremia: Likely related to poor oral intake.  Briefly hydrated with IV fluids and resolved.  Encourage oral  intake.  Hypercalcemia: Mild.  Due to dehydration.  Holding home supplements.  Resolved.  Acute on stage III chronic kidney disease: Baseline creatinine probably in the 1.2-1.3 range.  Creatinine peaked to 2.16 on 4/19.  Secondary to dehydration.  Brief IV fluids, now stopped.  Creatinine fluctuating but stable over the last 3 days.  Will repeat BMP in a.m.  Elevated troponin: Likely related to acute on chronic kidney disease, mild rhabdomyolysis related to fall and demand ischemia.  Downward trend.  No chest pain.  TTE 4/20: LVEF 55-60%, normal wall motion and no regional wall motion abnormalities and grade 1 diastolic dysfunction.  Essential hypertension: Uncontrolled.  Reduced metoprolol 50 mg twice daily is what she was taking at home as per d/w Pharmacy.  PRN IV hydralazine.  Remains elevated despite metoprolol.  Added and increased amlodipine to 10 mg daily.  May have to add a third agent if not optimally controlled.  Pain may be contributing at this time.  DM 2: A1c 01/28/17: 6.  Reasonable inpatient control.  Hypokalemia: Replaced.  Magnesium 2.1.  Mild rhabdomyolysis: CK down from peak of 1671 to 588.  Right groin pain: Unclear etiology.  Reported on 4/22.  X-ray of right hip and pelvis without acute findings.  Added as needed tramadol.  Ambulate with PT.  If pain persists then may need to obtain CT of the right hip.  History of fall PTA.    DVT prophylaxis: Subcutaneous heparin Code Status: DNR Family Communication: Discussed in detail with patient's niece on 4/21, updated care and answered questions.  No family at bedside today. Disposition: PT had recommended SNF but patient and  family declined SNF and insisted on going home.  DC home pending further improvement as above, hopefully 4/23.   Consultants:  None  Procedures:  None  Antimicrobials:  IV Zosyn-discontinued. IV ceftriaxone 4/20 >.   Subjective: Patient interviewed and examined along with her female RN in  room.  Reports pain in her right groin for last 2 days but did not mention it to me until today.  Has history of falls at home.  No dysuria reported.  No other complaints.  ROS: As above.  Objective:  Vitals:   07/31/17 1153 07/31/17 2140 08/01/17 0500 08/01/17 0647  BP: (!) 151/63 (!) 182/59 (!) 181/60 (!) 174/50  Pulse: 65 73 69 67  Resp: 20 16 14    Temp: 98.6 F (37 C) 98.2 F (36.8 C) 98.3 F (36.8 C)   TempSrc: Oral Oral Oral   SpO2: 100% 100% 100%   Weight:      Height:        Examination:  General exam: Pleasant elderly female, moderately built and nourished lying comfortably propped up in bed.  Oral mucosa moist.  In mild painful distress. Respiratory system: Clear to auscultation. Respiratory effort normal.  Stable. Cardiovascular system: S1 & S2 heard, RRR. No JVD, murmurs, rubs, gallops or clicks. No pedal edema.  Stable. Gastrointestinal system: Abdomen is nondistended, soft and nontender.  Laparotomy scar +.  Ventral hernia, uncomplicated.  No organomegaly or masses felt. Normal bowel sounds heard.  Right groin examined, no rash, tenderness or acute findings.  Stable. Central nervous system: Alert and oriented to person, place and partly to time.  No focal neurological deficits. Extremities: Symmetric 5 x 5 power.  Right knee healed surgical scar.  Right hip range of movements not painful. Skin: No rashes, lesions or ulcers Psychiatry: Judgement and insight appear impaired. Mood & affect appropriate.     Data Reviewed: I have personally reviewed following labs and imaging studies  CBC: Recent Labs  Lab 07/28/17 1247 07/29/17 0950 07/29/17 1012 07/30/17 0705  WBC 8.6 14.4*  --  12.7*  NEUTROABS  --  11.2*  --   --   HGB 13.4 13.2 15.0 12.7  HCT 40.1 39.7 44.0 38.3  MCV 88.5 87.4  --  87.4  PLT 217.0 174  --  557   Basic Metabolic Panel: Recent Labs  Lab 07/28/17 1247 07/29/17 0950 07/29/17 1012 07/29/17 1435 07/30/17 0705 07/31/17 0653  08/01/17 0515  NA 132* 133* 133*  --  130* 130* 132*  K 4.1 3.9 4.0  --  3.6 3.4* 3.8  CL 96 96* 98*  --  99* 100* 101  CO2 26 21*  --   --  20* 20* 22  GLUCOSE 145* 128* 124*  --  121* 124* 112*  BUN 25* 29* 31*  --  27* 24* 34*  CREATININE 1.69* 2.16* 2.10* 1.89* 1.53* 1.22* 1.47*  CALCIUM 10.6* 10.5*  --   --  9.3 8.9 9.4  MG  --   --   --   --   --   --  2.1   Liver Function Tests: Recent Labs  Lab 07/28/17 1247 07/29/17 0950 07/30/17 0705 07/31/17 0653  AST 29 59* 81* 58*  ALT 12 18 24 22   ALKPHOS 54 57 51 43  BILITOT 0.7 1.0 1.2 0.8  PROT 8.5* 9.0* 8.5* 7.1  ALBUMIN 4.3 4.2 3.8 3.3*   Cardiac Enzymes: Recent Labs  Lab 07/29/17 1435 07/29/17 1839 07/30/17 0058 07/30/17 3220 07/31/17 2542 08/01/17 0515  CKTOTAL 1,671*  --   --   --  933* 588*  TROPONINI  --  0.40* 0.32* 0.20*  --   --    CBG: Recent Labs  Lab 07/30/17 2035 07/31/17 0742 07/31/17 1150 07/31/17 1658 07/31/17 1730  GLUCAP 171* 127* 101* 126* 121*    Recent Results (from the past 240 hour(s))  Blood culture (routine x 2)     Status: None (Preliminary result)   Collection Time: 07/29/17 11:00 AM  Result Value Ref Range Status   Specimen Description BLOOD LEFT FOREARM  Final   Special Requests   Final    BOTTLES DRAWN AEROBIC AND ANAEROBIC Blood Culture adequate volume   Culture   Final    NO GROWTH 3 DAYS Performed at [facility], [place] [address] [place], [place] [address]    Report Status PENDING  Incomplete  Blood culture (routine x 2)     Status: None (Preliminary result)   Collection Time: 07/29/17 11:07 AM  Result Value Ref Range Status   Specimen Description BLOOD RIGHT HAND  Final   Special Requests   Final    BOTTLES DRAWN AEROBIC ONLY Blood Culture adequate volume   Culture   Final    NO GROWTH 3 DAYS Performed at [facility], [place] [address], [place], [place] [address]    Report Status PENDING  Incomplete  Urine culture     Status: Abnormal   Collection  Time: 07/29/17 12:06 PM  Result Value Ref Range Status   Specimen Description URINE, CATHETERIZED  Final   Special Requests   Final    NONE Performed at [facility], [address], [place], [place] [address]    Culture >=100,000 COLONIES/mL KLEBSIELLA PNEUMONIAE (A)  Final   Report Status 07/31/2017 FINAL  Final   Organism ID, Bacteria KLEBSIELLA PNEUMONIAE (A)  Final      Susceptibility   Klebsiella pneumoniae - MIC*    AMPICILLIN >=32 RESISTANT Resistant     CEFAZOLIN <=4 SENSITIVE Sensitive     CEFTRIAXONE <=1 SENSITIVE Sensitive     CIPROFLOXACIN <=0.25 SENSITIVE Sensitive     GENTAMICIN <=1 SENSITIVE Sensitive     IMIPENEM <=0.25 SENSITIVE Sensitive     NITROFURANTOIN 64 INTERMEDIATE Intermediate     TRIMETH/SULFA <=20 SENSITIVE Sensitive     AMPICILLIN/SULBACTAM 4 SENSITIVE Sensitive     PIP/TAZO <=4 SENSITIVE Sensitive     Extended ESBL NEGATIVE Sensitive     * >=100,000 COLONIES/mL KLEBSIELLA PNEUMONIAE         Radiology Studies: Dg Hip Unilat With Pelvis 2-3 Views Right  Result Date: 08/01/2017 CLINICAL DATA:  Unwitnessed fall, right hip pain. EXAM: DG HIP (WITH OR WITHOUT PELVIS) 2-3V RIGHT COMPARISON:  None. FINDINGS: No acute osseous or joint abnormality. Osseous structures are intact. Hip joint space is maintained bilaterally. There may be mild subchondral sclerosis along the right acetabular roof. Degenerative changes are seen in the spine. IMPRESSION: No acute findings. Electronically Signed   By: Lorin Picket M.D.   On: 08/01/2017 10:07        Scheduled Meds: . amLODipine  10 mg Oral Daily  . aspirin  325 mg Oral Daily  . heparin  5,000 Units Subcutaneous Q8H  . metoprolol tartrate  50 mg Oral BID  . senna-docusate  1 tablet Oral BID  . vitamin C  500 mg Oral Daily   Continuous Infusions: . cefTRIAXone (ROCEPHIN)  IV Stopped (08/01/17 1425)     LOS:  3 days     Vernell Leep, MD, FACP, [facility]. [facility] Pager [phone]  [phone]  If 7PM-7AM, please contact night-coverage [email or web address] Password [email or web address] 08/01/2017, 2:12 PM

## 2017-08-02 DIAGNOSIS — G9340 Encephalopathy, unspecified: Secondary | ICD-10-CM

## 2017-08-02 LAB — BASIC METABOLIC PANEL
ANION GAP: 8 (ref 5–15)
BUN: 29 mg/dL — AB (ref 6–20)
CO2: 22 mmol/L (ref 22–32)
Calcium: 9.3 mg/dL (ref 8.9–10.3)
Chloride: 101 mmol/L (ref 101–111)
Creatinine, Ser: 1.28 mg/dL — ABNORMAL HIGH (ref 0.44–1.00)
GFR, EST AFRICAN AMERICAN: 42 mL/min — AB (ref >60)
GFR, EST NON AFRICAN AMERICAN: 36 mL/min — AB (ref >60)
Glucose, Bld: 130 mg/dL — ABNORMAL HIGH (ref 65–99)
POTASSIUM: 4.4 mmol/L (ref 3.5–5.1)
SODIUM: 131 mmol/L — AB (ref 135–145)

## 2017-08-02 MED ORDER — AMLODIPINE BESYLATE 10 MG PO TABS: 10.0000 mg | ORAL_TABLET | Freq: Every day | ORAL | 0 refills | Status: DC

## 2017-08-02 NOTE — Progress Notes (Addendum)
Patient will DC to: [facility] Anticipated DC date: 08/02/17 Family notified: Niece, [name] Transport by: PTAR 1pm  Please make sure patient's eyedrops go with her. Patient's caregiver, Lollie Sails, will be accompanying patient.   Per MD patient ready for DC to [facility]. RN, patient, patient's family, and facility notified of DC. Discharge Summary sent to facility. RN given number for report [phone] Room 203). DC packet on chart. Ambulance transport requested for patient.   CSW signing off.  Cedric Fishman, LCSW Clinical Social Worker [phone]

## 2017-08-02 NOTE — Care Management Important Message (Signed)
Important Message  Patient Details  Name: Erin [name] MRN: [id number] Date of Birth: 18-Jan-1929   Medicare Important Message Given:  Yes    Jarvin Ogren 08/02/2017, 2:18 PM

## 2017-08-02 NOTE — Clinical Social Work Note (Signed)
Clinical Social Work Assessment  Patient Details  Name: Erin [name] MRN: [id number] Date of Birth: 1928-11-27  Date of referral:  08/02/17               Reason for consult:  Facility Placement                Permission sought to share information with:  Facility Sport and exercise psychologist, Family Supports Permission granted to share information::  Yes, Verbal Permission Granted  Name::     Civil engineer, contracting::  SNFs  Relationship::  Niece  Contact Information:  [phone]  Housing/Transportation Living arrangements for the past 2 months:  Single Family Home Source of Information:  Other (Comment Required) Patient Interpreter Needed:  None Criminal Activity/Legal Involvement Pertinent to Current Situation/Hospitalization:  No - Comment as needed Significant Relationships:  Adult Children, Other Family Members Lives with:  Relatives Do you feel safe going back to the place where you live?  No Need for family participation in patient care:  Yes (Comment)  Care giving concerns:  CSW received consult for possible SNF placement at time of discharge. CSW spoke with patient's niece, [name], regarding PT recommendation of SNF placement at time of discharge. Patient was disoriented. [name] has now agreed to SNF placement for patient. CSW to continue to follow and assist with discharge planning needs.   Social Worker assessment / plan:  CSW spoke with [place] concerning possibility of rehab at [facility] before returning home.  Employment status:  Retired Nurse, adult PT Recommendations:  [place] / Referral to community resources:  [place]  Patient/Family's Response to care:  Patient recognizes need for rehab before returning home and is agreeable to a SNF in [place]. [name] reported preference for [facility] and requests PTAR.  Patient/Family's Understanding of and Emotional Response to Diagnosis, Current Treatment, and  Prognosis:  Patient/family is realistic regarding therapy needs and expressed being hopeful for SNF placement. [name] expressed understanding of CSW role and discharge process as well as medical condition. No questions/concerns about plan or treatment.    Emotional Assessment Appearance:  Appears stated age Attitude/Demeanor/Rapport:  Gracious Affect (typically observed):  Accepting, Appropriate Orientation:  Oriented to Self, Oriented to Place, Oriented to  Time, Oriented to Situation Alcohol / Substance use:  Not Applicable Psych involvement (Current and /or in the community):  No (Comment)  Discharge Needs  Concerns to be addressed:  Care Coordination Readmission within the last 30 days:  No Current discharge risk:  None Barriers to Discharge:  No Barriers Identified   Benard Halsted, [place] 08/02/2017, 9:56 AM

## 2017-08-02 NOTE — Consult Note (Signed)
            [facility] CM Primary Care Navigator  08/02/2017  Erin [name] 1928/11/29 [id number]   Went to see patient at the bedside to identify possible discharge needs but she was alreadydischarged perstaffreport. Per chart review, patient was found by her niece on the floor, was conscious but confused. She was admitted with acute encephalopathy in the setting of dehydration, hypercalcemia and possible UTI.  Patient was discharged toskilled nursing facility (SNF)for rehabilitationper therapy recommendation.  Primary care provider's office is listed as providing transition of care (TOC) follow-up.   For additional questions please contact:  Edwena Felty A. Stepanie Graver, BSN, RN-BC [facility] PRIMARY CARE Navigator Cell: [phone]

## 2017-08-02 NOTE — Clinical Social Work Placement (Signed)
   CLINICAL SOCIAL WORK PLACEMENT  NOTE  Date:  08/02/2017  Patient Details  Name: Erin [name] MRN: [id number] Date of Birth: [birth year]  Clinical Social Work is seeking post-discharge placement for this patient at the [place] level of care (*CSW will initial, date and re-position this form in  chart as items are completed):  Yes   Patient/family provided with [facility] Work Department's list of facilities offering this level of care within the geographic area requested by the patient (or if unable, by the patient's family).  Yes   Patient/family informed of their freedom to choose among providers that offer the needed level of care, that participate in Medicare, Medicaid or managed care program needed by the patient, have an available bed and are willing to accept the patient.  Yes   Patient/family informed of [facility]'s ownership interest in [facility] and [facility], as well as of the fact that they are under no obligation to receive care at these facilities.  PASRR submitted to EDS on       PASRR number received on       Existing PASRR number confirmed on 08/02/17     FL2 transmitted to all facilities in geographic area requested by pt/family on 08/02/17     FL2 transmitted to all facilities within larger geographic area on       Patient informed that his/her managed care company has contracts with or will negotiate with certain facilities, including the following:        Yes   Patient/family informed of bed offers received.  Patient chooses bed at [facility] recommends and patient chooses bed at      Patient to be transferred to [facility] on 08/02/17.  Patient to be transferred to facility by PTAR     Patient family notified on 08/02/17 of transfer.  Name of family member notified:  [name]     PHYSICIAN Please prepare priority discharge summary, including medications,  Please sign FL2     Additional Comment:    _______________________________________________ Benard Halsted, LCSWA 08/02/2017, 9:58 AM

## 2017-08-02 NOTE — Discharge Summary (Signed)
Physician Discharge Summary  Patient ID: Erin [name] MRN: [id number] DOB/AGE: 1928/05/21 84 y.o.  Admit date: 07/29/2017 Discharge date: 08/02/2017  Admission Diagnoses:  Discharge Diagnoses:  Principal Problem:   Acute encephalopathy Active Problems:   Hyperlipidemia LDL goal <100   Essential hypertension   Acute renal failure superimposed on stage 2 chronic kidney disease (HCC)   UTI (urinary tract infection)   Chronic headache   Elevated lactic acid level   Hypercalcemia   Elevated troponin   Discharged Condition: stable  Hospital Course: Patient is an 84 year old female patient with past medical history significant for diabetes mellitus type 2, hyperlipidemia, hypertension, stage III chronic kidney disease, colon cancer, and dementia.  Patient was found by her niece on the floor on the morning of admission, patient was conscious but confused.  Patient was reported to have reduced oral intake for few weeks prior to admission.  Patient was admitted with acute encephalopathy in the setting of dehydration, hypercalcemia and possible UTI.    Patient was admitted for further assessment and management.    Klebsiella pneumonia acute lower UTI, recurrent: She had been started on empiric IV Zosyn and was narrowed to IV ceftriaxone on 07/30/17.  Patient has improved significantly.  Acute encephalopathy has resolved.  Volume depletion has also resolved.  The patient will be discharged to skilled nursing facility.  Acute toxic metabolic encephalopathy: Likely multifactorial due to UTI, volume depletion, hyponatremia, acute on chronic kidney disease and mild hypercalcemia complicating underlying dementia.  No focal deficits on exam.  CT head without acute findings.  Chest x-ray without pneumonia.  Treated underlying cause.    Volume depletion and hyponatremia: Likely related to poor oral intake.  Briefly hydrated with IV fluids and resolved.  Encourage oral intake.  Hypercalcemia: Mild.  Due  to volume depletion.  Resolved.  Acute on stage III chronic kidney disease: Baseline creatinine probably in the 1.2-1.3 range.  Creatinine peaked to 2.16 on 07/29/17.  Secondary to dehydration.  Brief IV fluids, now stopped.  Creatinine fluctuating but stable over the last 3 days.  Kindly continue to monitor renal function and electrolytes.  Elevated troponin: Likely related to acute on chronic kidney disease, mild rhabdomyolysis related to fall and demand ischemia.  Downward trend.  No chest pain.  TTE 4/20: LVEF 55-60%, normal wall motion and no regional wall motion abnormalities and grade 1 diastolic dysfunction.  Essential hypertension:  Continue to optimize.   DM 2: A1c 01/28/17: 6.  Reasonable inpatient control.  Hypokalemia: Replaced.    Mild rhabdomyolysis: CK down from peak of 1671 to 588.  Right groin pain: Unclear etiology.  Reported on 08/01/17.  X-ray of right hip and pelvis without acute findings.  A  Consults: None  Significant Diagnostic Studies: microbiology: urine culture: positive for Klebsiella pneumonia  Discharge Exam: Blood pressure (!) 147/55, pulse 60, temperature 99.2 F (37.3 C), temperature source Oral, resp. rate 18, height 5\' 7"  (1.702 m), weight 87.5 kg (193 lb), SpO2 100 %.   Disposition: Discharge disposition: 03-Skilled Nursing Facility       Discharge Instructions    Call MD for:   Complete by:  As directed    Call MD with worsening of symptoms.   Diet - low sodium heart healthy   Complete by:  As directed    Increase activity slowly   Complete by:  As directed      Allergies as of 08/02/2017      Reactions   Fluoxetine Hcl Nausea And Vomiting  Hydrocodone Nausea And Vomiting   Shellfish Allergy Nausea And Vomiting   Makes the patient feel "sick"   Tape Other (See Comments)   Tears and bruises; please use Coban wrap   Diclofenac Sodium Nausea And Vomiting   Latex Rash      Medication List    STOP taking these medications    acetaminophen 500 MG tablet Commonly known as:  TYLENOL   ibuprofen 200 MG tablet Commonly known as:  ADVIL,MOTRIN     TAKE these medications   amLODipine 10 MG tablet Commonly known as:  NORVASC Take 1 tablet (10 mg total) by mouth daily. Start taking on:  08/03/2017   aspirin 325 MG tablet Take 325 mg by mouth daily.   calcium carbonate 600 MG Tabs tablet Commonly known as:  OS-CAL Take 600 mg by mouth daily with breakfast.   metoprolol tartrate 50 MG tablet Commonly known as:  LOPRESSOR TAKE TWO TABLETS BY MOUTH TWICE DAILY What changed:    how much to take  how to take this  when to take this   Prednisolon-Gatiflox-Bromfenac 1-0.5-0.075 % Susp Instill 1 drop into the left eye once a day through 08/12/2017   senna-docusate 8.6-50 MG tablet Commonly known as:  SENNA-S TAKE TWO (2) TABLETS BY MOUTH 2 TIMES DAILY FOR CONSTIPATION   vitamin C 500 MG tablet Commonly known as:  ASCORBIC ACID Take 500 mg by mouth daily.      Contact information for after-discharge care    Destination    [facility] .   Service:  Skilled Nursing Contact information: [address] [place] [phone]              Signed: Bonnell Public 08/02/2017, 11:13 AM

## 2017-08-02 NOTE — NC FL2 (Signed)
[place] LEVEL OF CARE SCREENING TOOL     IDENTIFICATION  Patient Name: Erin [name] Birthdate: October 18, 1928 Sex: female Admission Date (Current Location): 07/29/2017  [facility] and [place] Number:  Herbalist and Address:  [facility], [place] [address] [place], [place] [address]      Provider Number: [id number]  Attending Physician Name and Address:  Bonnell Public, MD  Relative Name and Phone Number:  [name] [phone], niece    Current Level of Care: Hospital Recommended Level of Care: [place] Prior Approval Number:    Date Approved/Denied:   PASRR Number: [id number]  Discharge Plan: SNF    Current Diagnoses: Patient Active Problem List   Diagnosis Date Noted  . Elevated lactic acid level 07/29/2017  . Hypercalcemia 07/29/2017  . Elevated troponin 07/29/2017  . Retinal artery occlusion, branch, right 03/11/2017  . UTI (urinary tract infection) 03/11/2017  . Chronic headache 03/11/2017  . Routine general medical examination at a health care facility 08/08/2016  . Eczema 01/02/2016  . Brain aneurysm 10/22/2015  . Dementia 09/23/2015  . Abnormality of gait 09/23/2015  . Acute encephalopathy   . Aneurysm ([place])   . H/O: CVA (cerebrovascular accident)   . Goals of care, counseling/discussion   . Slow transit constipation   . Elevated serum creatinine 01/24/2014  . Risk for falls 07/05/2012  . Acute renal failure superimposed on stage 2 chronic kidney disease ([place]) 04/16/2012  . Colon cancer H/O 04/07/2012  . Sacroiliac joint dysfunction 04/01/2012  . Sciatica 03/31/2012  . Low back pain 04/02/2011  . Other screening mammogram 09/30/2010  . Hyperglycemia 09/30/2010  . Post-menopausal 09/30/2010  . ABDOMINAL WALL HERNIA 02/26/2010  . Mild anemia 08/18/2009  . Essential hypertension 10/31/2007  . Hyperlipidemia LDL goal <100 07/26/2006  . HEMORRHOIDS 07/26/2006  . OSTEOARTHRITIS 07/26/2006   . Osteopenia 07/26/2006  . Disturbance in sleep behavior 07/26/2006    Orientation RESPIRATION BLADDER Height & Weight     Self, Time, Situation, Place  Normal Incontinent, External catheter Weight: 87.5 kg (193 lb) Height:  5\' 7"  (170.2 cm)  BEHAVIORAL SYMPTOMS/MOOD NEUROLOGICAL BOWEL NUTRITION STATUS      Incontinent Diet(Please see DC Summary)  AMBULATORY STATUS COMMUNICATION OF NEEDS Skin   Limited Assist Verbally Normal                       Personal Care Assistance Level of Assistance  Bathing, Feeding, Dressing Bathing Assistance: Maximum assistance Feeding assistance: Independent Dressing Assistance: Limited assistance     Functional Limitations Info  Hearing   Hearing Info: Impaired      SPECIAL CARE FACTORS FREQUENCY  PT (By licensed PT), OT (By licensed OT)     PT Frequency: 5x/week OT Frequency: 3x/week            Contractures      Additional Factors Info  Code Status, Allergies Code Status Info: DNR Allergies Info: Fluoxetine Hcl, Hydrocodone, Shellfish Allergy, Tape, Diclofenac Sodium, Latex           Current Medications (08/02/2017):  This is the current hospital active medication list Current Facility-Administered Medications  Medication Dose Route Frequency Provider Last Rate Last Dose  . acetaminophen (TYLENOL) tablet 650 mg  650 mg Oral Q6H PRN Radene Gunning, NP   650 mg at 08/01/17 1038   Or  . acetaminophen (TYLENOL) suppository 650 mg  650 mg Rectal Q6H PRN Radene Gunning, NP      .  amLODipine (NORVASC) tablet 10 mg  10 mg Oral Daily Modena Jansky, MD   10 mg at 08/02/17 0851  . aspirin tablet 325 mg  325 mg Oral Daily Radene Gunning, NP   325 mg at 08/02/17 0849  . cefTRIAXone (ROCEPHIN) 1 g in sodium chloride 0.9 % 100 mL IVPB  1 g Intravenous Q24H Modena Jansky, MD   Stopped at 08/01/17 1425  . heparin injection 5,000 Units  5,000 Units Subcutaneous Q8H Radene Gunning, NP   5,000 Units at 08/02/17 6553  . hydrALAZINE  (APRESOLINE) injection 5 mg  5 mg Intravenous Q4H PRN Radene Gunning, NP   5 mg at 08/02/17 0743  . metoprolol tartrate (LOPRESSOR) tablet 50 mg  50 mg Oral BID Modena Jansky, MD   50 mg at 08/02/17 0850  . ondansetron (ZOFRAN) tablet 4 mg  4 mg Oral Q6H PRN Radene Gunning, NP       Or  . ondansetron [facility]) injection 4 mg  4 mg Intravenous Q6H PRN Radene Gunning, NP   4 mg at 07/29/17 1608  . senna-docusate (Senokot-S) tablet 1 tablet  1 tablet Oral BID Radene Gunning, NP   1 tablet at 08/02/17 [id number]  . traMADol (ULTRAM) tablet 50 mg  50 mg Oral Q6H PRN Modena Jansky, MD   50 mg at 08/01/17 2332  . vitamin C (ASCORBIC ACID) tablet 500 mg  500 mg Oral Daily Radene Gunning, NP   500 mg at 08/02/17 0000     Discharge Medications: Please see discharge summary for a list of discharge medications.  Relevant Imaging Results:  Relevant Lab Results:   Additional Information SSN: [id number]  Benard Halsted, LCSWA

## 2017-08-03 ENCOUNTER — Encounter: Payer: Self-pay | Admitting: Adult Health

## 2017-08-03 ENCOUNTER — Ambulatory Visit

## 2017-08-03 ENCOUNTER — Ambulatory Visit: Payer: Medicare Other

## 2017-08-03 ENCOUNTER — Non-Acute Institutional Stay (SKILLED_NURSING_FACILITY): Payer: Medicare Other | Admitting: Adult Health

## 2017-08-03 DIAGNOSIS — E871 Hypo-osmolality and hyponatremia: Secondary | ICD-10-CM | POA: Diagnosis not present

## 2017-08-03 DIAGNOSIS — N390 Urinary tract infection, site not specified: Secondary | ICD-10-CM | POA: Diagnosis not present

## 2017-08-03 DIAGNOSIS — Z8673 Personal history of transient ischemic attack (TIA), and cerebral infarction without residual deficits: Secondary | ICD-10-CM

## 2017-08-03 DIAGNOSIS — F0390 Unspecified dementia without behavioral disturbance: Secondary | ICD-10-CM | POA: Diagnosis not present

## 2017-08-03 DIAGNOSIS — K5901 Slow transit constipation: Secondary | ICD-10-CM

## 2017-08-03 DIAGNOSIS — N170 Acute kidney failure with tubular necrosis: Secondary | ICD-10-CM

## 2017-08-03 DIAGNOSIS — N183 Chronic kidney disease, stage 3 unspecified: Secondary | ICD-10-CM

## 2017-08-03 DIAGNOSIS — G9340 Encephalopathy, unspecified: Secondary | ICD-10-CM | POA: Diagnosis not present

## 2017-08-03 DIAGNOSIS — R531 Weakness: Secondary | ICD-10-CM

## 2017-08-03 DIAGNOSIS — I10 Essential (primary) hypertension: Secondary | ICD-10-CM

## 2017-08-03 DIAGNOSIS — R638 Other symptoms and signs concerning food and fluid intake: Secondary | ICD-10-CM

## 2017-08-03 DIAGNOSIS — R1031 Right lower quadrant pain: Secondary | ICD-10-CM

## 2017-08-03 LAB — CULTURE, BLOOD (ROUTINE X 2)
Culture: NO GROWTH
Special Requests: ADEQUATE

## 2017-08-03 LAB — BLOOD CULTURE ID PANEL (REFLEXED)
Acinetobacter baumannii: NOT DETECTED
CANDIDA GLABRATA: NOT DETECTED
CANDIDA KRUSEI: NOT DETECTED
CANDIDA TROPICALIS: NOT DETECTED
Candida albicans: NOT DETECTED
Candida parapsilosis: NOT DETECTED
ENTEROBACTER CLOACAE COMPLEX: NOT DETECTED
ESCHERICHIA COLI: NOT DETECTED
Enterobacteriaceae species: NOT DETECTED
Enterococcus species: NOT DETECTED
Haemophilus influenzae: NOT DETECTED
KLEBSIELLA OXYTOCA: NOT DETECTED
KLEBSIELLA PNEUMONIAE: NOT DETECTED
Listeria monocytogenes: NOT DETECTED
Neisseria meningitidis: NOT DETECTED
PROTEUS SPECIES: NOT DETECTED
Pseudomonas aeruginosa: NOT DETECTED
STAPHYLOCOCCUS SPECIES: NOT DETECTED
STREPTOCOCCUS PNEUMONIAE: NOT DETECTED
Serratia marcescens: NOT DETECTED
Staphylococcus aureus (BCID): NOT DETECTED
Streptococcus agalactiae: NOT DETECTED
Streptococcus pyogenes: NOT DETECTED
Streptococcus species: NOT DETECTED

## 2017-08-03 NOTE — Progress Notes (Signed)
Location:  [facility] Room Number: 203-A Place of Service:  SNF (31) Provider:  Durenda Age, NP  Patient Care Team: Tower, Wynelle Fanny, MD as PCP - General (Family Medicine)  Extended Emergency Contact Information Primary Emergency Contact: [facility] Address: [address]          [place], [place] [address] [place] Phone: [phone] Work Phone: [phone] Mobile Phone: [phone] Relation: Niece Secondary Emergency Contact: Radcliffe,Annette Address: [address]          [place],  [address] [place] Mobile Phone: [phone] Relation: Niece  Code Status:  Full Code  Goals of care: Advanced Directive information Advanced Directives 07/30/2016  Does Patient Have a Medical Advance Directive? Yes  Type of Paramedic of [place];Living will  Does patient want to make changes to medical advance directive? -  Copy of [place] in Chart? No - copy requested  Would patient like information on creating a medical advance directive? -  Pre-existing out of facility DNR order (yellow form or pink MOST form) -     Chief Complaint  Patient presents with  . Acute Visit    Hospital followup, status post hospitalization at [facility] 4/19-4/23/19 for acute encephalopathy    HPI:  Pt is an 84 y.o. female seen today for hospital followup.  She was admitted to [facility] on 08/02/17 for short-term rehabilitation following an admission at [facility] 4/19-4/23/19 for acute encephalopathy.  She has a PMH of HLD, essential hypertension, acute renal failure superimposed on stage 2 CKD, chronic headache, colon cancer, and dementia. She was found by her niece on the floor conscious but confused. She was reported to have reduced oral intake for a few weeks prior to the fall incident. Encephalopathy was thought to be multifactorial, due to UTI, volume depletion,  hyponatremia, acute on chronic kidney disease and mild hypercalcemia. CT head showed no acute findings. Chest x-ray was negative for pneumonia.She was treated for Klebsiella pneumonia UTI and was started on empirically with Zosyn and was changed to Ceftriaxone on 07/30/17. She was briefly hydrated with IV fluids. Acute encephalopathy has resolved. Her creatinine peaked to 2.16 and latest is 1.28. She was found to have elevated troponins which is likely due to acute on chronic kidney disease, mild rhabdomyolysis related to fall and demand ischemia. Troponin on downward trend. Right groin pain has unclear etiology. X-ray of right hip and pelvis without acute findings. She was seen in the room today and complained of constipation and pain on right groin. She was noted to have bruising on her neck which was thought to be from the fall. No bruising, edema, nor redness on right groin. Speech Therapist evaluated cognition and  SLUMS score was 12/30, dementia. She was reported to have poor oral intake of food.    Past Medical History:  Diagnosis Date  . Diabetes mellitus   . Encephalopathy   . Hyperlipidemia   . Hypertension   . Memory loss   . Obesity   . Osteoarthritis   . Osteoporosis   . Sacroiliac joint dysfunction 04/01/2012  . 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 APPENDECTOMY    . cervicitis  1964   conization   . CHOLECYSTECTOMY    . COLON RESECTION  1988   secondary to cancer  . endometrial polyp  05/2000   hyperplasia-laser treatment  . JOINT  REPLACEMENT     Rt knee replacement  . POLYPECTOMY  1998   benign X 2  . TOTAL KNEE ARTHROPLASTY  07/2009   right     Allergies  Allergen Reactions  . Fluoxetine Hcl Nausea And Vomiting  . Hydrocodone Nausea And Vomiting  . Shellfish Allergy Nausea And Vomiting    Makes the patient feel "sick"  . Tape Other (See Comments)    Tears and bruises; please use Coban wrap  .  Diclofenac Sodium Nausea And Vomiting  . Latex Rash    Outpatient Encounter Medications as of 08/03/2017  Medication Sig  . amLODipine (NORVASC) 10 MG tablet Take 1 tablet (10 mg total) by mouth daily.  Marland Kitchen aspirin 325 MG tablet Take 325 mg by mouth daily.   . calcium carbonate (OS-CAL) 600 MG TABS tablet Take 600 mg by mouth daily with breakfast.   . metoprolol tartrate (LOPRESSOR) 50 MG tablet Take 50 mg by mouth 2 (two) times daily.  . Prednisolon-Gatiflox-Bromfenac 1-0.5-0.075 % SUSP Instill 1 drop into the left eye once a day through 08/12/2017  . senna-docusate (SENNA-S) 8.6-50 MG tablet TAKE TWO (2) TABLETS BY MOUTH 2 TIMES DAILY FOR CONSTIPATION  . vitamin C (ASCORBIC ACID) 500 MG tablet Take 500 mg by mouth daily.   . [DISCONTINUED] metoprolol tartrate (LOPRESSOR) 50 MG tablet TAKE TWO TABLETS BY MOUTH TWICE DAILY   No facility-administered encounter medications on file as of 08/03/2017.     Review of Systems  GENERAL: No change in appetite, no fatigue, no weight changes, no fever, chills or weakness MOUTH and THROAT: Denies oral discomfort, gingival pain or bleeding RESPIRATORY: no cough, SOB, DOE, wheezing, hemoptysis CARDIAC: No chest pain, edema or palpitations GI: No abdominal pain, diarrhea, heart burn, nausea or vomiting GU: Denies dysuria, frequency, hematuria, or discharge PSYCHIATRIC: Denies feelings of depression or anxiety. No report of hallucinations, insomnia, paranoia, or agitation   Immunization History  Administered Date(s) Administered  . Influenza Split 04/02/2011, 01/07/2012  . Influenza Whole 05/20/2009, 03/23/2010  . Influenza,inj,Quad PF,6+ Mos 12/08/2012, 01/23/2014, 01/27/2015, 01/02/2016, 02/04/2017  . PPD Test 08/29/2015  . Pneumococcal Conjugate-13 07/26/2014  . Pneumococcal Polysaccharide-23 12/11/1996, 03/28/2007  . Td 12/11/1996, 03/28/2007   Pertinent  Health Maintenance Due  Topic Date Due  . FOOT EXAM  01/01/2017  . OPHTHALMOLOGY EXAM   02/10/2017  . HEMOGLOBIN A1C  07/29/2017  . URINE MICROALBUMIN  07/30/2017  . MAMMOGRAM  05/14/2020 (Originally 11/10/2013)  . INFLUENZA VACCINE  11/10/2017  . DEXA SCAN  Completed  . PNA vac Low Risk Adult  Completed   Fall Risk  07/30/2016 07/30/2015 07/26/2014 07/23/2013 07/05/2012  Falls in the past year? No Yes Yes No Yes  Number falls in past yr: - 1 2 or more - 1  Injury with Fall? - No No - -  Risk for fall due to : - - - - Impaired balance/gait;Impaired mobility;Medication side effect      Vitals:   08/03/17 0918  Pulse: 86  Resp: 20  Temp: 98.4 F (36.9 C)  TempSrc: Oral  SpO2: 99%  Weight: 193 lb (87.5 kg)  Height: 5\' 7"  (1.702 m)   Body mass index is 30.23 kg/m.  Physical Exam  GENERAL APPEARANCE: Well nourished. In no acute distress.Obese SKIN:  Skin is warm and dry.  MOUTH and THROAT: Lips are without lesions. Oral mucosa is moist and without lesions. Tongue is normal in shape, size, and color and without lesions RESPIRATORY: Breathing is even & unlabored, BS  CTAB CARDIAC: RRR, no murmur,no extra heart sounds, no edema GI: Abdomen soft, normal BS, no masses, no tenderness EXTREMITIES:  Able to move X 4 extremities PSYCHIATRIC: Alert to self, disoriented to time and place. Affect and behavior are appropriate   Labs reviewed: Recent Labs    07/31/17 0653 08/01/17 0515 08/02/17 0508  NA 130* 132* 131*  K 3.4* 3.8 4.4  CL 100* 101 101  CO2 20* 22 22  GLUCOSE 124* 112* 130*  BUN 24* 34* 29*  CREATININE 1.22* 1.47* 1.28*  CALCIUM 8.9 9.4 9.3  MG  --  2.1  --    Recent Labs    07/29/17 0950 07/30/17 0705 07/31/17 0653  AST 59* 81* 58*  ALT 18 24 22   ALKPHOS 57 51 43  BILITOT 1.0 1.2 0.8  PROT 9.0* 8.5* 7.1  ALBUMIN 4.2 3.8 3.3*   Recent Labs    10/28/16 1100 01/28/17 0839 07/28/17 1247 07/29/17 0950 07/29/17 1012 07/29/17 1435 07/30/17 0705  WBC 6.5 6.0 8.6 14.4*  --   --  12.7*  NEUTROABS 4.2 2.9  --  11.2*  --   --   --   HGB 11.5*  10.7* 13.4 13.2 15.0  --  12.7  HCT 35.9* 33.2* 40.1 39.7 44.0 37.3 38.3  MCV 88.9 88.9 88.5 87.4  --   --  87.4  PLT 176.0 164.0 217.0 174  --   --  188   Lab Results  Component Value Date   TSH 1.216 08/25/2015   Lab Results  Component Value Date   HGBA1C 6.0 01/28/2017   Lab Results  Component Value Date   CHOL 167 01/28/2017   HDL 41.60 01/28/2017   LDLCALC 89 01/28/2017   LDLDIRECT 58.0 07/30/2016   TRIG 182.0 (H) 01/28/2017   CHOLHDL 4 01/28/2017    Significant Diagnostic Results in last 30 days:  Dg Chest 2 View  Result Date: 07/29/2017 CLINICAL DATA:  Altered mental status. EXAM: CHEST - 2 VIEW COMPARISON:  08/25/2015. FINDINGS: Mediastinum hilar structures normal. Heart size normal. Left base. No pleural effusion or pneumothorax. Degenerative change thoracic spine. IMPRESSION: Left base atelectasis. Electronically Signed   By: Marcello Moores  Register   On: 07/29/2017 10:54   Ct Head Wo Contrast  Result Date: 07/29/2017 CLINICAL DATA:  Cervical spine trauma. EXAM: CT HEAD WITHOUT CONTRAST CT CERVICAL SPINE WITHOUT CONTRAST TECHNIQUE: Multidetector CT imaging of the head and cervical spine was performed following the standard protocol without intravenous contrast. Multiplanar CT image reconstructions of the cervical spine were also generated. COMPARISON:  None. FINDINGS: CT HEAD FINDINGS Brain: Mild chronic ischemic white matter disease is noted. No mass effect or midline shift is noted. Ventricular size is within normal limits. There is no evidence of mass lesion, hemorrhage or acute infarction. Vascular: No hyperdense vessel or unexpected calcification. Skull: Normal. Negative for fracture or focal lesion. Sinuses/Orbits: No acute finding. Other: None. CT CERVICAL SPINE FINDINGS Alignment: Normal. Skull base and vertebrae: No acute fracture. No primary bone lesion or focal pathologic process. Soft tissues and spinal canal: No prevertebral fluid or swelling. No visible canal hematoma.  Disc levels: Mild degenerative disc disease is noted at C5-6. Moderate degenerative disc disease is noted at C6-7 with anterior osteophyte formation. Upper chest: Biapical scarring is noted. Other: Degenerative changes seen involving posterior facet joints bilaterally. IMPRESSION: Mild chronic ischemic white matter disease. No acute intracranial abnormality seen. Multilevel degenerative disc disease is noted in the cervical spine. No fracture or spondylolisthesis is noted. Electronically  Signed   By: Marijo Conception, M.D.   On: 07/29/2017 11:03   Ct Cervical Spine Wo Contrast  Result Date: 07/29/2017 CLINICAL DATA:  Cervical spine trauma. EXAM: CT HEAD WITHOUT CONTRAST CT CERVICAL SPINE WITHOUT CONTRAST TECHNIQUE: Multidetector CT imaging of the head and cervical spine was performed following the standard protocol without intravenous contrast. Multiplanar CT image reconstructions of the cervical spine were also generated. COMPARISON:  None. FINDINGS: CT HEAD FINDINGS Brain: Mild chronic ischemic white matter disease is noted. No mass effect or midline shift is noted. Ventricular size is within normal limits. There is no evidence of mass lesion, hemorrhage or acute infarction. Vascular: No hyperdense vessel or unexpected calcification. Skull: Normal. Negative for fracture or focal lesion. Sinuses/Orbits: No acute finding. Other: None. CT CERVICAL SPINE FINDINGS Alignment: Normal. Skull base and vertebrae: No acute fracture. No primary bone lesion or focal pathologic process. Soft tissues and spinal canal: No prevertebral fluid or swelling. No visible canal hematoma. Disc levels: Mild degenerative disc disease is noted at C5-6. Moderate degenerative disc disease is noted at C6-7 with anterior osteophyte formation. Upper chest: Biapical scarring is noted. Other: Degenerative changes seen involving posterior facet joints bilaterally. IMPRESSION: Mild chronic ischemic white matter disease. No acute intracranial  abnormality seen. Multilevel degenerative disc disease is noted in the cervical spine. No fracture or spondylolisthesis is noted. Electronically Signed   By: Marijo Conception, M.D.   On: 07/29/2017 11:03   Dg Hip Unilat With Pelvis 2-3 Views Right  Result Date: 08/01/2017 CLINICAL DATA:  Unwitnessed fall, right hip pain. EXAM: DG HIP (WITH OR WITHOUT PELVIS) 2-3V RIGHT COMPARISON:  None. FINDINGS: No acute osseous or joint abnormality. Osseous structures are intact. Hip joint space is maintained bilaterally. There may be mild subchondral sclerosis along the right acetabular roof. Degenerative changes are seen in the spine. IMPRESSION: No acute findings. Electronically Signed   By: Lorin Picket M.D.   On: 08/01/2017 10:07    Assessment/Plan  1. Acute encephalopathy - thought to be multifactorial, due to UTI, volume depletion, hyponatremia, acute on chronic kidney disease and mild hypercalcemia. CT head showed no acute findings. Chest x-ray was negative for pneumonia.She was treated for Klebsiella pneumonia UTI and was started on empirically with Zosyn and was changed to Ceftriaxone on 07/30/17. She was briefly hydrated with IV fluids. Speech therapy will will do cognition therapy   2. Generalized weakness - for rehabilitation with PT and OT, for therapeutic strengthening exercises, fall precautions   3. Urinary tract infection without hematuria, site unspecified - was started on empirically with Zosyn and was changed to Ceftriaxone on 07/30/17.    4. Hyponatremia - was briefly hydrated with IV fluids, latest NA 131, will monitor   5. H/O: CVA (cerebrovascular accident) - will continue ASA 325 mg daily, Metoprolol tartrate 50 mg 2 tab BID and Amlodipine 10 mg daily   6. Essential hypertension -  Metoprolol tartrate 50 mg 2 tab BID and Amlodipine 10 mg daily    7. Slow transit constipation -  Will start Senna-S 8.6-50 mg 2 tabs BID and start Miralax 17 gm + 4-8 oz liquid daily   8. Right  groin pain -  has unclear etiology. X-ray of right hip and pelvis without acute findings; will start Acetaminophen 325 mg 2 tabs = 650 mg PO Q 6 hours PRN   9. Acute renal failure with acute tubular necrosis superimposed on stage 3 chronic kidney disease ([place]) -     10.  Dementia without behavioral disturbance - continue supportive care, fall precautions   11. Decreased oral intake - will start medpass 120 ml BID @ 10AM and 2PM and Magic cup on trays TID, encourage oral fluid intake    Family/ staff Communication: Discussed plan of care with resident and charge nurse.  Labs/tests ordered:  CBC and BMP   Goals of care:   Short-term care   Durenda Age, NP [facility] [phone] (Monday-Friday 8:00 a.m. - 5:00 p.m.) [phone] (after hours)

## 2017-08-04 ENCOUNTER — Encounter: Payer: Self-pay | Admitting: Internal Medicine

## 2017-08-04 ENCOUNTER — Non-Acute Institutional Stay (SKILLED_NURSING_FACILITY): Payer: Medicare Other | Admitting: Internal Medicine

## 2017-08-04 DIAGNOSIS — T796XXD Traumatic ischemia of muscle, subsequent encounter: Secondary | ICD-10-CM | POA: Diagnosis not present

## 2017-08-04 DIAGNOSIS — N179 Acute kidney failure, unspecified: Secondary | ICD-10-CM | POA: Diagnosis not present

## 2017-08-04 DIAGNOSIS — N182 Chronic kidney disease, stage 2 (mild): Secondary | ICD-10-CM | POA: Diagnosis not present

## 2017-08-04 DIAGNOSIS — N390 Urinary tract infection, site not specified: Secondary | ICD-10-CM

## 2017-08-04 DIAGNOSIS — B961 Klebsiella pneumoniae [K. pneumoniae] as the cause of diseases classified elsewhere: Secondary | ICD-10-CM | POA: Diagnosis not present

## 2017-08-04 DIAGNOSIS — G9340 Encephalopathy, unspecified: Secondary | ICD-10-CM | POA: Diagnosis not present

## 2017-08-04 DIAGNOSIS — R419 Unspecified symptoms and signs involving cognitive functions and awareness: Secondary | ICD-10-CM | POA: Diagnosis not present

## 2017-08-04 DIAGNOSIS — T796XXA Traumatic ischemia of muscle, initial encounter: Secondary | ICD-10-CM | POA: Insufficient documentation

## 2017-08-04 DIAGNOSIS — B9689 Other specified bacterial agents as the cause of diseases classified elsewhere: Secondary | ICD-10-CM

## 2017-08-04 LAB — CBC AND DIFFERENTIAL
HCT: 37 (ref 36–46)
HEMOGLOBIN: 12.3 (ref 12.0–16.0)
Neutrophils Absolute: 7
PLATELETS: 230 (ref 150–399)
WBC: 9.3

## 2017-08-04 LAB — BASIC METABOLIC PANEL
BUN: 31 — AB (ref 4–21)
CREATININE: 1.3 — AB (ref 0.5–1.1)
Glucose: 132
Potassium: 4.2 (ref 3.4–5.3)
Sodium: 134 — AB (ref 137–147)

## 2017-08-04 NOTE — Patient Instructions (Signed)
See assessment and plan under each diagnosis in the problem list and acutely for this visit 

## 2017-08-04 NOTE — Progress Notes (Signed)
NURSING HOME LOCATION:  [facility] ROOM NUMBER:  203-A  CODE STATUS:  Full Code  PCP:  Abner Greenspan, MD  [place] [address]   This is a comprehensive admission note to [facility] performed on this date less than 30 days from date of admission. Included are preadmission medical/surgical history;reconciled medication list; family history; social history and comprehensive review of systems.  Corrections and additions to the records were documented. Comprehensive physical exam was also performed. Additionally a clinical summary was entered for each active diagnosis pertinent to this admission in the Problem List to enhance continuity of care.  HPI:  The patient was hospitalized 4/19-4/23/19 with acute encephalopathy. The patient was found by her niece on the floor the morning of admission , conscious but confused. Reported was decreased oral intake for a few weeks prior to admission. She was admitted with acute encephalopathy in the context of dehydration, hyponatremia, hypercalcemia (10.6)and possible UTI. CT of the head revealed no acute changes. Empirically she received IV Zosyn which was narrowed to IV ceftriaxone on 4/20. There was marked clinical improvement with resolution of the acute encephalopathy.Culture did reveal Klebsiella pneumonia UTI  She had acute on stage III chronic kidney disease. Creatinine peaked at 2.16. Elevated troponin was attributed to acute on chronic kidney disease, mild rhabdomyolysis secondary to fall,& demand ischemia. CK peaked at 1671 Additionally the patient complained of right groin pain, imaging was negative.  TTE 4/20 revealed ejection fraction of 55-60 percent with normal wall motion and no regional wall abnormalities.Grade 1 diastolic dysfunction was present. Labs @ discharge revealed normal sodium and calcium. Creatinine was 1.28 ,BUN 29, & GFR 42.  Past medical and surgical history: Includes upper GI bleeding  from an AV malformation while on anticoagulants osteoporosis; memory loss; dyslipidemia, CNS aneurysm ,essential hypertension.  Surgeries include TKA, polypectomy, laser ablation of endometrial polyp, cholecystectomy, and colon resection for malignancy.  Social history: Nondrinker, former smoker.  Family history: History reviewed   Review of systems:  History was unreliable. Her score on the SLUMS was 12 out of 30 Date given as "April [birth year]". The year was confirmed as " 90" a total of 3 separate  times. She does describe constipation. She also states she has some intermittent pain in the right inguinal area. She denies any genitourinary symptoms.  Physical exam:  Pertinent or positive findings: Pattern alopecia is present. Eyebrows are essentially absent. There is slight ptosis on the right and she tends to splint on the right. There is a slightly raised pigmented nevus of the right upper lid. She is edentulous but wears only the upper plate. There is varus deformity of the knees. There is an elongated op scar of the right knee. She will intermittently describe discomfort with rotation of the right hip. She has punctate bruises in the right lower quadrant, clinically these were post needle injections. She has a small bruise over the right anterior neck. Limbs are generally weak, symmetrically so. Clubbing is suggested.  General appearance: Adequately nourished; no acute distress, increased work of breathing is present.   Lymphatic: No lymphadenopathy about the head, neck, axilla. Eyes: No conjunctival inflammation or lid edema is present. There is no scleral icterus. Ears:  External ear exam shows no significant lesions or deformities.   Nose:  External nasal examination shows no deformity or inflammation. Nasal mucosa are pink and moist without lesions, exudates Oral exam: Lips and gums are healthy appearing.There is no oropharyngeal erythema  or exudate. Neck:  No thyromegaly, masses, tenderness  noted.    Heart:  Normal rate and regular rhythm. S1 and S2 normal without gallop, murmur, click, rub .  Lungs: Chest clear to auscultation without wheezes, rhonchi, rales, rubs. Abdomen: Bowel sounds are normal.  Abdomen is soft and nontender with no organomegaly, hernias, masses. GU: Deferred  Extremities:  No cyanosis, edema  Neurologic exam:   Balance, Rhomberg, finger to nose testing could not be completed due to clinical state Deep tendon reflexes are equal Skin: Warm & dry w/o tenting. No significant lesions or rash.  See clinical summary under each active problem in the Problem List with associated updated therapeutic plan

## 2017-08-04 NOTE — Assessment & Plan Note (Signed)
Probable contributing factor to her acute kidney insult

## 2017-08-04 NOTE — Assessment & Plan Note (Signed)
Aricept and Namenda not indicated clinically

## 2017-08-04 NOTE — Assessment & Plan Note (Addendum)
SLUMS is a mental status assessment of possible dementia published by the [employer]. The test differentiates between high school or less education level in reference to presence or absence of dementia. For high school education a score of 27-30 is normal, 21-26 is minimal neurocognitive deficit and 1-22 suggests dementia. With less than a high school education similar scoring is 25-30, 20-24, and 1-19. BA from [employer]&T Score 12/30, clinically baseline dementia present

## 2017-08-04 NOTE — Assessment & Plan Note (Signed)
Pre-discharge creatinine 1.28, BUN 29, and GFR 42 Avoid nephrotoxic drugs and monitor renal function

## 2017-08-05 LAB — CULTURE, BLOOD (ROUTINE X 2): Special Requests: ADEQUATE

## 2017-08-08 LAB — CBC AND DIFFERENTIAL
HCT: 41 (ref 36–46)
Hemoglobin: 13.4 (ref 12.0–16.0)
NEUTROS ABS: 10
Platelets: 260 (ref 150–399)
WBC: 13

## 2017-08-08 LAB — BASIC METABOLIC PANEL
BUN: 49 — AB (ref 4–21)
Creatinine: 2 — AB (ref 0.5–1.1)
Glucose: 117
POTASSIUM: 4.8 (ref 3.4–5.3)
SODIUM: 134 — AB (ref 137–147)

## 2017-08-09 ENCOUNTER — Encounter: Payer: Self-pay | Admitting: Adult Health

## 2017-08-09 ENCOUNTER — Non-Acute Institutional Stay (SKILLED_NURSING_FACILITY): Payer: Medicare Other | Admitting: Adult Health

## 2017-08-09 DIAGNOSIS — G9340 Encephalopathy, unspecified: Secondary | ICD-10-CM

## 2017-08-09 DIAGNOSIS — N179 Acute kidney failure, unspecified: Secondary | ICD-10-CM

## 2017-08-09 DIAGNOSIS — J189 Pneumonia, unspecified organism: Secondary | ICD-10-CM

## 2017-08-09 DIAGNOSIS — N189 Chronic kidney disease, unspecified: Secondary | ICD-10-CM | POA: Diagnosis not present

## 2017-08-09 DIAGNOSIS — K5901 Slow transit constipation: Secondary | ICD-10-CM | POA: Diagnosis not present

## 2017-08-09 NOTE — Progress Notes (Signed)
Location:  [facility] Room Number: 115-A Place of Service:  SNF (31) Provider:  Durenda Age, NP  Patient Care Team: Tower, Wynelle Fanny, MD as PCP - General (Family Medicine)  Extended Emergency Contact Information Primary Emergency Contact: [facility] Address: [address]          [place], [place] [address] [place] Phone: [phone] Work Phone: [phone] Mobile Phone: [phone] Relation: Niece Secondary Emergency Contact: Radcliffe,Annette Address: [address]          [place], [place] [address] [place] Mobile Phone: [phone] Relation: Niece  Code Status:  Full Code  Goals of care: Advanced Directive information Advanced Directives 07/30/2016  Does Patient Have a Medical Advance Directive? Yes  Type of Paramedic of [place];Living will  Does patient want to make changes to medical advance directive? -  Copy of [place] in Chart? No - copy requested  Would patient like information on creating a medical advance directive? -  Pre-existing out of facility DNR order (yellow form or pink MOST form) -     Chief Complaint  Patient presents with  . Acute Visit    Patient seen for chest x-ray and laboratory data consistent with pneumonia    HPI:  Pt is an 84 y.o. female seen today for an acute visit secondary to pneumonia demonstrated on chest x-ray. She was reported to have altered level of consciousness yesterday. Her vital signs were all normal but was noted to be nonverbal. Labs revealed wbc 13.0 and chest x-ray showed left lower lobe atelectasis or pneumonia. No reported fever. Creatinine increased from 1.30 to 1.97, BUN from 30.7 to 48.5, GFR 25.91. She was started on D5 1/2 NS X 2L via PIV. She was conversant and complained of constipation. She is a short-term rehabilitation resident at [facility].  She has a PMH of HLD, essential  hypertension, acute renal failure superimposed on stage 2 CKD, chronic headaches, colon cancer, and dementia.     Past Medical History:  Diagnosis Date  . Diabetes mellitus   . Encephalopathy   . Hyperlipidemia   . Hypertension   . Memory loss   . Obesity   . Osteoarthritis   . Osteoporosis   . Sacroiliac joint dysfunction 04/01/2012  . 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 APPENDECTOMY    . cervicitis  1964   conization   . CHOLECYSTECTOMY    . COLON RESECTION  1988   secondary to cancer  . endometrial polyp  05/2000   hyperplasia-laser treatment  . JOINT REPLACEMENT     Rt knee replacement  . POLYPECTOMY  1998   benign X 2  . TOTAL KNEE ARTHROPLASTY  07/2009   right     Allergies  Allergen Reactions  . Fluoxetine Hcl Nausea And Vomiting  . Hydrocodone Nausea And Vomiting  . Shellfish Allergy Nausea And Vomiting    Makes the patient feel "sick"  . Tape Other (See Comments)    Tears and bruises; please use Coban wrap  . Diclofenac Sodium Nausea And Vomiting  . Latex Rash    Outpatient Encounter Medications as of 08/09/2017  Medication Sig  . acetaminophen (TYLENOL) 325 MG tablet Take 650 mg by mouth every 6 (six) hours as needed for mild pain.  Marland Kitchen amLODipine (NORVASC) 10 MG tablet Take 1 tablet (10 mg total) by mouth daily.  Marland Kitchen  aspirin 325 MG tablet Take 325 mg by mouth daily.   . calcium carbonate (OS-CAL) 600 MG TABS tablet Take 600 mg by mouth daily with breakfast.   . metoprolol tartrate (LOPRESSOR) 50 MG tablet Take 50 mg by mouth 2 (two) times daily.  Marland Kitchen NUTRITIONAL SUPPLEMENT LIQD Take 120 mLs by mouth 3 (three) times daily.   . Nutritional Supplements (NUTRITIONAL SUPPLEMENT PO) Take 1 each by mouth. Magic Cup on trays  . polyethylene glycol (MIRALAX / GLYCOLAX) packet Take 17 g by mouth daily.  . Prednisolon-Gatiflox-Bromfenac 1-0.5-0.075 % SOLN Apply 1 drop to eye daily at 12 noon. Instill  into left eye to prevent swelling infection  . senna-docusate (SENNA-S) 8.6-50 MG tablet TAKE TWO (2) TABLETS BY MOUTH 2 TIMES DAILY FOR CONSTIPATION  . vitamin C (ASCORBIC ACID) 500 MG tablet Take 500 mg by mouth daily.   . [DISCONTINUED] Prednisolon-Gatiflox-Bromfenac 1-0.5-0.075 % SUSP Instill 1 drop into the left eye once a day through 08/12/2017   No facility-administered encounter medications on file as of 08/09/2017.     Review of Systems  GENERAL: No change in appetite, no fatigue, no weight changes, no fever, chills or weakness MOUTH and THROAT: Denies oral discomfort, gingival pain or bleeding RESPIRATORY: no cough, SOB, DOE, wheezing, hemoptysis CARDIAC: No chest pain, edema or palpitations GI: +constipation GU: Denies dysuria, frequency, hematuria,or discharge PSYCHIATRIC: Denies feelings of depression or anxiety. No report of hallucinations, insomnia, paranoia, or agitation    Immunization History  Administered Date(s) Administered  . Influenza Split 04/02/2011, 01/07/2012  . Influenza Whole 05/20/2009, 03/23/2010  . Influenza,inj,Quad PF,6+ Mos 12/08/2012, 01/23/2014, 01/27/2015, 01/02/2016, 02/04/2017  . PPD Test 08/29/2015  . Pneumococcal Conjugate-13 07/26/2014  . Pneumococcal Polysaccharide-23 12/11/1996, 03/28/2007  . Td 12/11/1996, 03/28/2007   Pertinent  Health Maintenance Due  Topic Date Due  . FOOT EXAM  01/01/2017  . OPHTHALMOLOGY EXAM  02/10/2017  . HEMOGLOBIN A1C  07/29/2017  . URINE MICROALBUMIN  07/30/2017  . MAMMOGRAM  05/14/2020 (Originally 11/10/2013)  . INFLUENZA VACCINE  11/10/2017  . DEXA SCAN  Completed  . PNA vac Low Risk Adult  Completed   Fall Risk  07/30/2016 07/30/2015 07/26/2014 07/23/2013 07/05/2012  Falls in the past year? No Yes Yes No Yes  Number falls in past yr: - 1 2 or more - 1  Injury with Fall? - No No - -  Risk for fall due to : - - - - Impaired balance/gait;Impaired mobility;Medication side effect      Vitals:   08/09/17  0922  BP: 136/80  Pulse: 70  Resp: 20  Temp: (!) 97.3 F (36.3 C)  TempSrc: Oral  SpO2: 98%  Weight: 192 lb 12.8 oz (87.5 kg)  Height: 5\' 7"  (1.702 m)   Body mass index is 30.2 kg/m.  Physical Exam  GENERAL APPEARANCE: Well nourished. In no acute distress. Obese SKIN:  Skin is warm and dry.  MOUTH and THROAT: Lips are without lesions. Oral mucosa is moist and without lesions. Tongue is normal in shape, size, and color and without lesions RESPIRATORY: Breathing is even & unlabored, BS CTAB CARDIAC: RRR, no murmur,no extra heart sounds, no edema GI: Abdomen soft, normal BS, no masses, no tenderness EXTREMITIES:  Able to move X 4 extremities PSYCHIATRIC: Alert to self, disoriented to time and place. Affect and behavior are appropriate  Labs reviewed: Recent Labs    07/31/17 0653 08/01/17 0515 08/02/17 0508 08/04/17  NA 130* 132* 131* 134*  K 3.4* 3.8 4.4 4.2  CL 100* 101 101  --   CO2 20* 22 22  --   GLUCOSE 124* 112* 130*  --   BUN 24* 34* 29* 31*  CREATININE 1.22* 1.47* 1.28* 1.3*  CALCIUM 8.9 9.4 9.3  --   MG  --  2.1  --   --    Recent Labs    07/29/17 0950 07/30/17 0705 07/31/17 0653  AST 59* 81* 58*  ALT 18 24 22   ALKPHOS 57 51 43  BILITOT 1.0 1.2 0.8  PROT 9.0* 8.5* 7.1  ALBUMIN 4.2 3.8 3.3*   Recent Labs    01/28/17 0839 07/28/17 1247 07/29/17 0950 07/29/17 1012 07/29/17 1435 07/30/17 0705 08/04/17  WBC 6.0 8.6 14.4*  --   --  12.7* 9.3  NEUTROABS 2.9  --  11.2*  --   --   --  7  HGB 10.7* 13.4 13.2 15.0  --  12.7 12.3  HCT 33.2* 40.1 39.7 44.0 37.3 38.3 37  MCV 88.9 88.5 87.4  --   --  87.4  --   PLT 164.0 217.0 174  --   --  188 230   Lab Results  Component Value Date   TSH 1.216 08/25/2015   Lab Results  Component Value Date   HGBA1C 6.0 01/28/2017   Lab Results  Component Value Date   CHOL 167 01/28/2017   HDL 41.60 01/28/2017   LDLCALC 89 01/28/2017   LDLDIRECT 58.0 07/30/2016   TRIG 182.0 (H) 01/28/2017   CHOLHDL 4  01/28/2017    Significant Diagnostic Results in last 30 days:  Dg Chest 2 View  Result Date: 07/29/2017 CLINICAL DATA:  Altered mental status. EXAM: CHEST - 2 VIEW COMPARISON:  08/25/2015. FINDINGS: Mediastinum hilar structures normal. Heart size normal. Left base. No pleural effusion or pneumothorax. Degenerative change thoracic spine. IMPRESSION: Left base atelectasis. Electronically Signed   By: Marcello Moores  Register   On: 07/29/2017 10:54   Ct Head Wo Contrast  Result Date: 07/29/2017 CLINICAL DATA:  Cervical spine trauma. EXAM: CT HEAD WITHOUT CONTRAST CT CERVICAL SPINE WITHOUT CONTRAST TECHNIQUE: Multidetector CT imaging of the head and cervical spine was performed following the standard protocol without intravenous contrast. Multiplanar CT image reconstructions of the cervical spine were also generated. COMPARISON:  None. FINDINGS: CT HEAD FINDINGS Brain: Mild chronic ischemic white matter disease is noted. No mass effect or midline shift is noted. Ventricular size is within normal limits. There is no evidence of mass lesion, hemorrhage or acute infarction. Vascular: No hyperdense vessel or unexpected calcification. Skull: Normal. Negative for fracture or focal lesion. Sinuses/Orbits: No acute finding. Other: None. CT CERVICAL SPINE FINDINGS Alignment: Normal. Skull base and vertebrae: No acute fracture. No primary bone lesion or focal pathologic process. Soft tissues and spinal canal: No prevertebral fluid or swelling. No visible canal hematoma. Disc levels: Mild degenerative disc disease is noted at C5-6. Moderate degenerative disc disease is noted at C6-7 with anterior osteophyte formation. Upper chest: Biapical scarring is noted. Other: Degenerative changes seen involving posterior facet joints bilaterally. IMPRESSION: Mild chronic ischemic white matter disease. No acute intracranial abnormality seen. Multilevel degenerative disc disease is noted in the cervical spine. No fracture or  spondylolisthesis is noted. Electronically Signed   By: Marijo Conception, M.D.   On: 07/29/2017 11:03   Ct Cervical Spine Wo Contrast  Result Date: 07/29/2017 CLINICAL DATA:  Cervical spine trauma. EXAM: CT HEAD WITHOUT CONTRAST CT CERVICAL SPINE WITHOUT CONTRAST TECHNIQUE: Multidetector CT imaging of the head and  cervical spine was performed following the standard protocol without intravenous contrast. Multiplanar CT image reconstructions of the cervical spine were also generated. COMPARISON:  None. FINDINGS: CT HEAD FINDINGS Brain: Mild chronic ischemic white matter disease is noted. No mass effect or midline shift is noted. Ventricular size is within normal limits. There is no evidence of mass lesion, hemorrhage or acute infarction. Vascular: No hyperdense vessel or unexpected calcification. Skull: Normal. Negative for fracture or focal lesion. Sinuses/Orbits: No acute finding. Other: None. CT CERVICAL SPINE FINDINGS Alignment: Normal. Skull base and vertebrae: No acute fracture. No primary bone lesion or focal pathologic process. Soft tissues and spinal canal: No prevertebral fluid or swelling. No visible canal hematoma. Disc levels: Mild degenerative disc disease is noted at C5-6. Moderate degenerative disc disease is noted at C6-7 with anterior osteophyte formation. Upper chest: Biapical scarring is noted. Other: Degenerative changes seen involving posterior facet joints bilaterally. IMPRESSION: Mild chronic ischemic white matter disease. No acute intracranial abnormality seen. Multilevel degenerative disc disease is noted in the cervical spine. No fracture or spondylolisthesis is noted. Electronically Signed   By: Marijo Conception, M.D.   On: 07/29/2017 11:03   Dg Hip Unilat With Pelvis 2-3 Views Right  Result Date: 08/01/2017 CLINICAL DATA:  Unwitnessed fall, right hip pain. EXAM: DG HIP (WITH OR WITHOUT PELVIS) 2-3V RIGHT COMPARISON:  None. FINDINGS: No acute osseous or joint abnormality. Osseous  structures are intact. Hip joint space is maintained bilaterally. There may be mild subchondral sclerosis along the right acetabular roof. Degenerative changes are seen in the spine. IMPRESSION: No acute findings. Electronically Signed   By: Lorin Picket M.D.   On: 08/01/2017 10:07    Assessment/Plan  1. Acute encephalopathy - thought to be due to acute kidney injury and pneumonia, currently on D5 1/2 NS IV X 1L, was verbal today   2. Acute kidney injury superimposed on chronic kidney disease ([place]) - creatinine up from 1.30 to 1.97, BUN up from 30.7 to 48.5, currently on D 5 1/2 NS X 2L, will re-check BMP after IVF X 2L   3. HCAP (healthcare-associated pneumonia) - chest x-ray result showed left lower lobe atelectasis or pneumonia and wbc 13.0 , will start Doxycycline 100 mg 1 tab PO BID X 10 days and Florastor 250 mg 1 capsule BID X 13 days   4. Slow transit constipation - currently on Senna-s 8.6-50 mg 2 tabs BID, will add Lactulose 10gm/15 ml give 30 ml = 20 gm daily    Family/ staff Communication:  Discussed plan of care with patient and charge nurse   Labs/tests ordered:  BMP on 08/10/17 and CBC in 1 week  Goals of care:   Short-term care  Durenda Age, NP [facility] [phone] (Monday-Friday 8:00 a.m. - 5:00 p.m.) [phone] (after hours)

## 2017-08-10 ENCOUNTER — Telehealth: Payer: Self-pay | Admitting: *Deleted

## 2017-08-10 ENCOUNTER — Encounter: Admitting: Family Medicine

## 2017-08-10 LAB — CBC AND DIFFERENTIAL
HCT: 41 (ref 36–46)
Hemoglobin: 13.4 (ref 12.0–16.0)
Neutrophils Absolute: 10
Platelets: 260 (ref 150–399)
WBC: 13

## 2017-08-10 LAB — BASIC METABOLIC PANEL
BUN: 48 — AB (ref 4–21)
CREATININE: 1.7 — AB (ref 0.5–1.1)
GLUCOSE: 122
POTASSIUM: 4.3 (ref 3.4–5.3)
Sodium: 135 — AB (ref 137–147)

## 2017-08-10 NOTE — Telephone Encounter (Signed)
Let her know please -per the hospital notes it was added because bp was high - so we will watch for lowered bp and remove it later if needed  For now- keep it going  Thanks

## 2017-08-10 NOTE — Telephone Encounter (Signed)
Erin [name] was here at the office and wanted me to have Dr. Glori Bickers look into pt's BP meds. Pt has been taking metoprolol 50mg  BID for years and when she was d/c from the hospital no one told Erin [name] her meds had changed but Erin [name] received a call from pharmacy saying that pt's norvase was ready for pick up. Pt hasn't been on norvase in years and Erin [name] just wanted to know if pt is suppose to be on norvase too, or was then in error, please advise

## 2017-08-11 NOTE — Telephone Encounter (Signed)
[name] advise of Dr. Marliss Coots comments but she is concerned because pt hasn't been on this med since she was d/c from the hospital. Pt is at [facility] rehab and if you look at their last OV notes her BP was pretty normal and that is without the amlodipine. [name] said she will pick up Rx and have pt start taking it but she wanted to make sure Dr. Glori Bickers was aware that she hasn't been on med since she was d/c from hospital. She wants Dr. Glori Bickers to review [facility] OV notes with her BP reading and then let her know what she wants pt to do

## 2017-08-11 NOTE — Addendum Note (Signed)
Addended by: Tammi Sou on: 08/11/2017 02:08 PM   Modules accepted: Orders

## 2017-08-11 NOTE — Telephone Encounter (Signed)
Erin [name] notified of Dr. Glori Bickers comments and med removed from med list. Erin [name] said we don't have to do a d/c order because the rehab never gave it to her

## 2017-08-11 NOTE — Telephone Encounter (Signed)
If her bp is in the normal range w/o amlodipine- can d/c it -please call in the order if needed Thanks

## 2017-08-18 DIAGNOSIS — E785 Hyperlipidemia, unspecified: Secondary | ICD-10-CM | POA: Diagnosis not present

## 2017-08-18 DIAGNOSIS — Z79899 Other long term (current) drug therapy: Secondary | ICD-10-CM | POA: Diagnosis not present

## 2017-08-18 DIAGNOSIS — E119 Type 2 diabetes mellitus without complications: Secondary | ICD-10-CM | POA: Diagnosis not present

## 2017-08-18 DIAGNOSIS — D649 Anemia, unspecified: Secondary | ICD-10-CM | POA: Diagnosis not present

## 2017-08-18 DIAGNOSIS — E039 Hypothyroidism, unspecified: Secondary | ICD-10-CM | POA: Diagnosis not present

## 2017-09-07 ENCOUNTER — Telehealth: Payer: Self-pay

## 2017-09-07 NOTE — Telephone Encounter (Signed)
Please give verbal order to eval and tx for PT /home care  Her last nursing home note is in the chart  Dr Elmer Ramp cared for her there-may have to add more specific orders if needed since I have not seen her for current stay

## 2017-09-07 NOTE — Telephone Encounter (Signed)
Copied from [place] [id number]. Topic: General - Other >> Sep 07, 2017  9:14 AM Oneta Rack wrote: Caller name: Slade,[name] Relation to pt: niece  Call back number: [phone]   Reason for call:  Patient currently at [facility], [place], [place] [address]  [phone], patient discharging on Friday 09/09/17, requesting skilled nursing orders for [facility], please advise >> Sep 07, 2017  9:17 AM Oneta Rack wrote: Caller name: Slade,[name] Relation to pt: niece  Call back number: [phone]   Reason for call:  Patient currently at [facility], [place], [place] [address]  [phone], patient discharging on Friday 09/09/17, requesting skilled nursing orders for [facility], please advise

## 2017-09-08 NOTE — Telephone Encounter (Signed)
Spoke with Guss Bunde who called in and she advise me that the assisted living did initiate the PT eval when she gets home but [name] just wanted Dr. Glori Bickers to be aware because the PT will need verbal orders from her once they see pt and eval her. I advise [name] that Dr. Glori Bickers will be okay with giving verbal order if needed

## 2017-09-12 ENCOUNTER — Telehealth: Payer: Self-pay | Admitting: Family Medicine

## 2017-09-12 NOTE — Telephone Encounter (Signed)
Copied from [place] [phone]. Topic: Inquiry >> Sep 12, 2017 12:53 PM Lennox Solders wrote: Reason for CRM: kendrice rn [facility].  Pt was discharge from [facility] rehab on 09-09-17. Kendrice would like order for UA and in and out cath. Pt is having burning and discharge. Pt does not have urine odor. Pt is wearing brief. Kendrice would  like verbal order for skill nursing once a wk for 1 wk starting on 09-10-17 then skill nursing started on 6-2 for twice a wk for 5 wk and finish 10-16-17  with skill nursing  once a week then requesting 2 prn visit for complication with medication or disease management.

## 2017-09-12 NOTE — Telephone Encounter (Signed)
Please ok those verbal orders  

## 2017-09-12 NOTE — Telephone Encounter (Signed)
Left VM giving Kendrice the verbal order for all verbal orders she requested

## 2017-09-13 ENCOUNTER — Other Ambulatory Visit: Payer: Self-pay | Admitting: Family Medicine

## 2017-09-13 MED ORDER — CEPHALEXIN 500 MG PO CAPS: 500.0000 mg | ORAL_CAPSULE | Freq: Three times a day (TID) | ORAL | 0 refills | Status: DC

## 2017-09-13 NOTE — Telephone Encounter (Signed)
Copied from [place] [phone]. Topic: Quick Communication - See Telephone Encounter >> Sep 13, 2017 11:38 AM Neva Seat wrote: Melonie Florida - niece / caregiver- [phone] is needing Dr. Marliss Coots nurse to give her a call back to discuss pt care. Friday appt w/ Dr. Glori Bickers was cancelled due to pt being immobile.

## 2017-09-13 NOTE — Telephone Encounter (Signed)
Letter printed and in IN box 

## 2017-09-13 NOTE — Telephone Encounter (Signed)
Please send in high dose keflex 500 mg tid to her pref pharmacy (I pended the px) Enc fluids Keep trying to get a specimen please  If condition worsens-get to ED by ambulance if necessary and keep me posted   Send this back to me and I will work on the letter  Thanks !

## 2017-09-13 NOTE — Telephone Encounter (Signed)
Called [name] back she has 3 issues she wants Dr. Glori Bickers to be aware of  1. For pt's insurance to continue to pay for pt's care [name] request that Dr. Glori Bickers write a letter just saying that due to pt's multiple medical issues (name them if possible) that pt does needed 24hr care. She needs letter faxed to [employer] at [phone], with pt's policy # on the fax (Policy # [id number])  2. Pt had a f/u appt scheduled this Friday and she isn't able to come because she isn't mobile yet. PT is working with her to get her strength up but they can't transfer pt from the wheelchair to the car yet so they will reschedule appt once pt is able to do that.  3. [name] is very concern that pt has a UTI, the PT/ nurse tried twice to get a in/out Cath on pt and they were unsuccessful due to pt being is so much pain. Pt has severe dysuria and since Sunday [name] noticed that when she cleans pt off that she has had some yellowish d/c coming from her vaginal area. She is concerned that her sxs are worsening and that even though the RN hasn't been able to get a sample that pt has a UTI that may be worsening

## 2017-09-13 NOTE — Telephone Encounter (Signed)
Rx sent to [facility], [place].  [name] advised about other instructions and understands.  Note returned to Dr. Glori Bickers to work on letter.

## 2017-09-14 ENCOUNTER — Telehealth: Payer: Self-pay | Admitting: Family Medicine

## 2017-09-14 NOTE — Telephone Encounter (Signed)
Letter faxed and copy mailed to pt per Guss Bunde

## 2017-09-14 NOTE — Telephone Encounter (Signed)
Copied from [place] [phone]. Topic: Quick Communication - See Telephone Encounter >> Sep 14, 2017  1:23 PM Synthia Innocent wrote: CRM for notification. See Telephone encounter for: 09/14/17. [facility] calling for verbal orders, PT 2x a week for 8 weeks. Please advise

## 2017-09-14 NOTE — Telephone Encounter (Signed)
Please ok those verbal orders  

## 2017-09-15 NOTE — Telephone Encounter (Signed)
Left VM giving Kristen the verbal order

## 2017-09-16 ENCOUNTER — Inpatient Hospital Stay: Payer: Medicare Other | Admitting: Family Medicine

## 2017-09-19 ENCOUNTER — Telehealth: Payer: Self-pay

## 2017-09-19 ENCOUNTER — Other Ambulatory Visit (INDEPENDENT_AMBULATORY_CARE_PROVIDER_SITE_OTHER): Payer: Medicare Other

## 2017-09-19 DIAGNOSIS — R300 Dysuria: Secondary | ICD-10-CM | POA: Diagnosis not present

## 2017-09-19 DIAGNOSIS — N3001 Acute cystitis with hematuria: Secondary | ICD-10-CM | POA: Diagnosis not present

## 2017-09-19 LAB — POC URINALSYSI DIPSTICK (AUTOMATED)
BILIRUBIN UA: NEGATIVE
Blood, UA: NEGATIVE
Glucose, UA: NEGATIVE
Ketones, UA: NEGATIVE
LEUKOCYTES UA: NEGATIVE
NITRITE UA: NEGATIVE
PH UA: 6.5 (ref 5.0–8.0)
PROTEIN UA: NEGATIVE
Spec Grav, UA: 1.015 (ref 1.010–1.025)
Urobilinogen, UA: 0.2 E.U./dL

## 2017-09-19 NOTE — Telephone Encounter (Signed)
Erin [name] notified we did receive urine sample and advise UA was normal but we sent urine for a culture to verify UTI has resolved

## 2017-09-19 NOTE — Telephone Encounter (Signed)
Copied from [place] [phone]. Topic: Quick Communication - See Telephone Encounter >> Sep 19, 2017  2:00 PM Antonieta Iba C wrote: CRM for notification. See Telephone encounter for: 09/19/17.  Pt's family Melonie Florida) called in to confirm if urine specimen was received by provider from home health nurse?   Please advise   CB: [phone]

## 2017-09-20 LAB — URINE CULTURE
MICRO NUMBER: 90693650
Result:: NO GROWTH
SPECIMEN QUALITY: ADEQUATE

## 2017-09-20 NOTE — Telephone Encounter (Signed)
Spoke to [place] and advised urine negative and awaiting culture. See results note

## 2017-09-21 ENCOUNTER — Telehealth: Payer: Self-pay | Admitting: Family Medicine

## 2017-09-21 NOTE — Telephone Encounter (Signed)
Let me know if she feels dizzy or has other symptoms   Hold the medicine tonight  Watch bp  Keep me posted please Thanks for the update

## 2017-09-21 NOTE — Telephone Encounter (Signed)
Copied from [place] [phone]. Topic: Quick Communication - See Telephone Encounter >> Sep 21, 2017  4:33 PM Percell Belt A wrote: CRM for notification. See Telephone encounter for: 09/21/17.  Kendrice with [facility] [phone] She is reporting low pulse of 48 , pt is due a dose of metoprolol this evening   Please advise

## 2017-09-22 NOTE — Telephone Encounter (Signed)
Per Dr. Glori Bickers pt is going to hold metoprolol all day today and the home health nurse will check BP and pulse tomorrow and update [place] on how it's running. Pt did say she felt a little nauseous and dizzy but not to bad and she is just a little disoriented but per [name] that's her baseline since leaving the hospital. FYI to day Tower and I left VM on home health's phone to update [place] tomorrow on how BP is running with out the metoprolol.

## 2017-09-23 ENCOUNTER — Telehealth: Payer: Self-pay | Admitting: Family Medicine

## 2017-09-23 NOTE — Telephone Encounter (Signed)
Kendrice Nurse from [facility] calling with BP reading of 180/60 heart rate 68 Dr Glori Bickers wanted the nurse to call back with readings

## 2017-09-23 NOTE — Telephone Encounter (Signed)
Please ok that verbal order  

## 2017-09-23 NOTE — Telephone Encounter (Signed)
Left VM giving Marlowe Kays the verbal order

## 2017-09-23 NOTE — Telephone Encounter (Signed)
Nurse Dorthea Cove from [facility]  did say that themetoprolol tartrate (LOPRESSOR) 50 MG tablet has been administered waiting on further instructions  Pt complains of headache yesterday and today

## 2017-09-23 NOTE — Telephone Encounter (Signed)
Copied from [place] [phone]. Topic: General - Other >> Sep 23, 2017  1:56 PM Yvette Rack wrote: Reason for CRM: Marlowe Kays with [facility] request verbal orders for Occupational Therapy 1 time week for 2 weeks and 2 times a week for 5 weeks. Cb# [phone]

## 2017-09-23 NOTE — Telephone Encounter (Signed)
Left VM letting nurse know Dr. Marliss Coots comments

## 2017-09-23 NOTE — Telephone Encounter (Signed)
Let me know how she feels after the metoprolol kicks in and how bp is - do resume it  Thanks

## 2017-10-04 ENCOUNTER — Telehealth: Payer: Self-pay

## 2017-10-04 NOTE — Telephone Encounter (Signed)
Copied from [place] [phone]. Topic: General - Other >> Oct 04, 2017  1:13 PM Alfredia Ferguson R wrote: Pts niece Guss Bunde is wanting someone to call to discuss pts medications that were given to her at the facility.  Callback # [phone]

## 2017-10-04 NOTE — Telephone Encounter (Signed)
I spoke with Guss Bunde (DPR signed);[name] said pt is not able to get in vehicle yet but while pt was in facility at [facility] in [place] pt was started on amlodipine besylate 10 mg taking one tab po daily and escitalopram 10 mg taking one tab po daily. Pt had 3 of escitalopram and 5 amlodipine left. [name] request refill on both meds to [facility]. [name] said Dr Glori Bickers is aware of pt being on both meds. Pt is continuing to take metoprolol tartrate 50 mg bid which is on med list. [name] said nurse from [facility] comes out twice a week and pts BP is OK now. [name] did not know BP values. There is a med list under media tab dated 09/15/17. [name] request cb after Dr Glori Bickers reviews and hopefully refills.

## 2017-10-05 MED ORDER — ESCITALOPRAM OXALATE 10 MG PO TABS: 10.0000 mg | ORAL_TABLET | Freq: Every day | ORAL | 11 refills | Status: DC

## 2017-10-05 MED ORDER — AMLODIPINE BESYLATE 10 MG PO TABS: 10.0000 mg | ORAL_TABLET | Freq: Every day | ORAL | 11 refills | Status: DC

## 2017-10-05 NOTE — Telephone Encounter (Signed)
[name] notified Rxs sent and to keep [place] updated

## 2017-10-05 NOTE — Telephone Encounter (Signed)
I sent them  Please let me know if any problems

## 2017-10-09 DIAGNOSIS — Z0279 Encounter for issue of other medical certificate: Secondary | ICD-10-CM

## 2017-10-25 ENCOUNTER — Telehealth: Payer: Self-pay | Admitting: Family Medicine

## 2017-10-25 NOTE — Telephone Encounter (Signed)
Verbal order given  

## 2017-10-25 NOTE — Telephone Encounter (Signed)
Please ok those verbal orders  

## 2017-10-25 NOTE — Telephone Encounter (Signed)
Copied from [place] [phone]. Topic: Quick Communication - See Telephone Encounter >> Oct 25, 2017 11:37 AM Ahmed Prima L wrote: CRM for notification. See Telephone encounter for: 10/25/17.  Tillie Rung, RN with [facility] called for verbals for twice a week for two weeks for nursing. Call back @ [phone]

## 2017-10-26 ENCOUNTER — Telehealth: Payer: Self-pay | Admitting: Family Medicine

## 2017-10-26 NOTE — Telephone Encounter (Signed)
Please give the verbal order.  

## 2017-10-26 NOTE — Telephone Encounter (Signed)
Copied from [place] #[id number]. >> Oct 26, 2017 11:01 AM Burchel, Abbi R wrote: Request v/o for Home health aid--assistance with bath 2xwk for 2 wks    Cb #: [phone] [facility]

## 2017-10-26 NOTE — Telephone Encounter (Signed)
Verbal order left on VM  

## 2017-10-26 NOTE — Telephone Encounter (Signed)
Copied from [place] [id number]. Topic: General - Other >> Oct 26, 2017 11:01 AM Burchel, Abbi R wrote: Request v/o for Home health aid--assistance with bath 2xwk for 2 wks    Cb #: [phone] [facility]

## 2017-11-07 ENCOUNTER — Telehealth: Payer: Self-pay | Admitting: Family Medicine

## 2017-11-07 NOTE — Telephone Encounter (Signed)
Please ok those verbal orders Thanks  

## 2017-11-07 NOTE — Telephone Encounter (Signed)
Verbal order given to Kendrice,   Also Kendrice did want me to let Dr. Glori Bickers know that pt did fall last week trying to go to bedside commode by herself, she is okay and they did coach pt on making sure that she always has help around before she tries to get up going forward but since pt had a fall Kendrice has to send Dr. Glori Bickers a fall report she just wanted to give [place] a heads up that the fax should be sent today or tomorrow

## 2017-11-07 NOTE — Telephone Encounter (Signed)
Copied from [place] [phone]. Topic: Quick Communication - See Telephone Encounter >> Nov 07, 2017  2:55 PM Synthia Innocent wrote: CRM for notification. See Telephone encounter for: 11/07/17. [facility] requesting verbals order for skilled nursing 2x a week for 4 weeks, 1x a week for 4 weeks, Aide for bathing 2x a week for 8 weeks

## 2017-11-07 NOTE — Telephone Encounter (Signed)
Aware, thanks!

## 2017-11-07 NOTE — Telephone Encounter (Signed)
Copied from [place] [phone]. Topic: Quick Communication - See Telephone Encounter >> Nov 07, 2017  8:41 AM Synthia Innocent wrote: CRM for notification. See Telephone encounter for: 11/07/17. [facility] calling for verbal orders, PT 2x a week for 8 weeks. Please advise.

## 2017-11-07 NOTE — Telephone Encounter (Signed)
Please ok those verbal orders  

## 2017-11-07 NOTE — Telephone Encounter (Signed)
Left VM giving Erasmo Downer the verbal order

## 2017-11-09 ENCOUNTER — Telehealth: Payer: Self-pay | Admitting: Family Medicine

## 2017-11-09 NOTE — Telephone Encounter (Signed)
Please ok those verbal orders  

## 2017-11-09 NOTE — Telephone Encounter (Signed)
Copied from [place] [phone]. Topic: General - Other >> Nov 09, 2017 10:47 AM Lennox Solders wrote: Reason for CRM: mallory OT [facility]  is calling and needs verbal orders extension for OT for once a wk and then twice a wk for 6 wks

## 2017-11-09 NOTE — Telephone Encounter (Signed)
Left VM giving verbal order  

## 2017-11-10 ENCOUNTER — Telehealth: Payer: Self-pay | Admitting: Family Medicine

## 2017-11-10 MED ORDER — RANITIDINE HCL 150 MG PO CAPS: 150.0000 mg | ORAL_CAPSULE | Freq: Two times a day (BID) | ORAL | 11 refills | Status: DC

## 2017-11-10 NOTE — Telephone Encounter (Signed)
Copied from [place] [phone]. Topic: General - Other >> Nov 10, 2017 11:55 AM Yvette Rack wrote: Reason for CRM: Erin [name] Nurse from [facility]  [phone] calling stating that pt need a RX for [place] for intergestion she states that she think that someone at the faciliy had wrote this for the pt but the pt is now at home and she is home bound [id number] [place], [place] [phone] (Phone) [phone] (Fax)

## 2017-11-10 NOTE — Telephone Encounter (Signed)
Left VM letting Tillie Rung know Rx was sent and advise of Dr. Marliss Coots comments

## 2017-11-10 NOTE — Telephone Encounter (Signed)
I sent it  Please let them know  Keep me posted if any problems  Thanks

## 2017-12-21 ENCOUNTER — Encounter: Payer: Self-pay | Admitting: Family Medicine

## 2017-12-21 ENCOUNTER — Ambulatory Visit: Payer: Medicare Other | Admitting: Family Medicine

## 2017-12-21 VITALS — BP 114/68 | HR 50 | Temp 98.1°F | Ht 70.0 in | Wt 184.0 lb

## 2017-12-21 DIAGNOSIS — I10 Essential (primary) hypertension: Secondary | ICD-10-CM | POA: Diagnosis not present

## 2017-12-21 DIAGNOSIS — Z9181 History of falling: Secondary | ICD-10-CM

## 2017-12-21 DIAGNOSIS — G9340 Encephalopathy, unspecified: Secondary | ICD-10-CM | POA: Diagnosis not present

## 2017-12-21 DIAGNOSIS — E785 Hyperlipidemia, unspecified: Secondary | ICD-10-CM

## 2017-12-21 DIAGNOSIS — Z23 Encounter for immunization: Secondary | ICD-10-CM

## 2017-12-21 DIAGNOSIS — R419 Unspecified symptoms and signs involving cognitive functions and awareness: Secondary | ICD-10-CM

## 2017-12-21 DIAGNOSIS — E119 Type 2 diabetes mellitus without complications: Secondary | ICD-10-CM

## 2017-12-21 DIAGNOSIS — N179 Acute kidney failure, unspecified: Secondary | ICD-10-CM

## 2017-12-21 DIAGNOSIS — C189 Malignant neoplasm of colon, unspecified: Secondary | ICD-10-CM

## 2017-12-21 DIAGNOSIS — N390 Urinary tract infection, site not specified: Secondary | ICD-10-CM

## 2017-12-21 DIAGNOSIS — N182 Chronic kidney disease, stage 2 (mild): Secondary | ICD-10-CM | POA: Diagnosis not present

## 2017-12-21 DIAGNOSIS — T796XXS Traumatic ischemia of muscle, sequela: Secondary | ICD-10-CM | POA: Diagnosis not present

## 2017-12-21 DIAGNOSIS — R739 Hyperglycemia, unspecified: Secondary | ICD-10-CM | POA: Diagnosis not present

## 2017-12-21 NOTE — Assessment & Plan Note (Signed)
Due for A1c today

## 2017-12-21 NOTE — Assessment & Plan Note (Signed)
With UTI - followed by pneumonia and rhabdomyalosis  Now recovered Neuro symptoms resolved  Reviewed hospital records, lab results and studies in detail

## 2017-12-21 NOTE — Assessment & Plan Note (Signed)
Due for lipid panel today. °

## 2017-12-21 NOTE — Progress Notes (Signed)
Subjective:    Patient ID: Erin [name], female    DOB: [birth year], 84 y.o.   MRN: [id number]  HPI  Here for f/u from rehabilitation following a hospital stay   Wt Readings from Last 3 Encounters:  12/21/17 184 lb (83.5 kg)  08/09/17 192 lb 12.8 oz (87.5 kg)  08/04/17 193 lb (87.5 kg)   26.40 kg/m   She was hosp from 4/19 to 4/23 for acute encephalopathy /UTI/ renal failure   klebiella uti was tx with ceftriaxone and resolved  Volume depletion and hyponatremia and renal failure tx with IVF K replaced as well  Ct head - no acute findings  She also had inc troponin due to rhabdomyolysis (found on floor at home)  Echocardiogram showed grade 1 DD   Last labs Lab Results  Component Value Date   CREATININE 1.7 (A) 08/10/2017   BUN 48 (A) 08/10/2017   NA 135 (A) 08/10/2017   K 4.3 08/10/2017   CL 101 08/02/2017   CO2 22 08/02/2017   Lab Results  Component Value Date   WBC 13.0 08/10/2017   HGB 13.4 08/10/2017   HCT 41 08/10/2017   MCV 87.4 07/30/2017   PLT 260 08/10/2017  glucose 122   Imaging  Show images for CT Cervical Spine Wo Contrast  Study Result   CLINICAL DATA:  Cervical spine trauma.  EXAM: CT HEAD WITHOUT CONTRAST  CT CERVICAL SPINE WITHOUT CONTRAST  TECHNIQUE: Multidetector CT imaging of the head and cervical spine was performed following the standard protocol without intravenous contrast. Multiplanar CT image reconstructions of the cervical spine were also generated.  COMPARISON:  None.  FINDINGS: CT HEAD FINDINGS  Brain: Mild chronic ischemic white matter disease is noted. No mass effect or midline shift is noted. Ventricular size is within normal limits. There is no evidence of mass lesion, hemorrhage or acute infarction.  Vascular: No hyperdense vessel or unexpected calcification.  Skull: Normal. Negative for fracture or focal lesion.  Sinuses/Orbits: No acute finding.  Other: None.  CT CERVICAL SPINE  FINDINGS  Alignment: Normal.  Skull base and vertebrae: No acute fracture. No primary bone lesion or focal pathologic process.  Soft tissues and spinal canal: No prevertebral fluid or swelling. No visible canal hematoma.  Disc levels: Mild degenerative disc disease is noted at C5-6. Moderate degenerative disc disease is noted at C6-7 with anterior osteophyte formation.  Upper chest: Biapical scarring is noted.  Other: Degenerative changes seen involving posterior facet joints bilaterally.  IMPRESSION: Mild chronic ischemic white matter disease. No acute intracranial abnormality seen.  Multilevel degenerative disc disease is noted in the cervical spine. No fracture or spondylolisthesis is noted.   Electronically Signed   By: Marijo Conception, M.D.   On: 07/29/2017 11:03    She did also develop healthcare assoc pneumonia and tx with doxycycline LLL atelectasis vs infiltrate    Has been home since late June  Has improved a lot with home health and PT  Walking with a walker - walking to the bathroom and kitchen and also for her PT  Has exercises - once per day (supposed to do tid)  Is often wiped out   Appetite is good / family says so/so  Supplementing with ensure in the am  Sandwich for lunch and a some meat with dinner  Drinking water- working on fluid intake   Thinks that age has caught up with her- everything is harder and takes longer   No urinary symptoms  No  fever  No cough   Lab Results  Component Value Date   HGBA1C 6.0 01/28/2017   Patient Active Problem List   Diagnosis Date Noted  . Traumatic rhabdomyolysis ([place]) 08/04/2017  . Neurocognitive disorder 08/04/2017  . Elevated lactic acid level 07/29/2017  . Hypercalcemia 07/29/2017  . Elevated troponin 07/29/2017  . Retinal artery occlusion, branch, right 03/11/2017  . UTI (urinary tract infection) 03/11/2017  . Chronic headache 03/11/2017  . Eczema 01/02/2016  . Brain aneurysm  10/22/2015  . Dementia 09/23/2015  . Abnormality of gait 09/23/2015  . Acute encephalopathy   . H/O: CVA (cerebrovascular accident)   . Goals of care, counseling/discussion   . Slow transit constipation   . Elevated serum creatinine 01/24/2014  . Risk for falls 07/05/2012  . Acute renal failure superimposed on stage 2 chronic kidney disease ([place]) 04/16/2012  . Colon cancer H/O 04/07/2012  . Sacroiliac joint dysfunction 04/01/2012  . Sciatica 03/31/2012  . Low back pain 04/02/2011  . Other screening mammogram 09/30/2010  . Hyperglycemia 09/30/2010  . Post-menopausal 09/30/2010  . ABDOMINAL WALL HERNIA 02/26/2010  . Mild anemia 08/18/2009  . Essential hypertension 10/31/2007  . Hyperlipidemia LDL goal <100 07/26/2006  . HEMORRHOIDS 07/26/2006  . OSTEOARTHRITIS 07/26/2006  . Osteopenia 07/26/2006  . Disturbance in sleep behavior 07/26/2006   Past Medical History:  Diagnosis Date  . Diabetes mellitus   . Encephalopathy   . Hyperlipidemia   . Hypertension   . Memory loss   . Obesity   . Osteoarthritis   . Osteoporosis   . Sacroiliac joint dysfunction 04/01/2012  . 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 APPENDECTOMY    . cervicitis  1964   conization   . CHOLECYSTECTOMY    . COLON RESECTION  1988   secondary to cancer  . endometrial polyp  05/2000   hyperplasia-laser treatment  . JOINT REPLACEMENT     Rt knee replacement  . POLYPECTOMY  1998   benign X 2  . TOTAL KNEE ARTHROPLASTY  07/2009   right    Social History   Tobacco Use  . Smoking status: Former Smoker    Types: Cigarettes    Last attempt to quit: 08/14/1962    Years since quitting: 55.3  . Smokeless tobacco: Never Used  . Tobacco comment: Quit over 10 years ago  Substance Use Topics  . Alcohol use: No    Alcohol/week: 0.0 standard drinks  . Drug use: No   Family History  Problem Relation Age of Onset  . Kidney failure Mother   .  Stroke Father   . Cancer Brother        lung   Allergies  Allergen Reactions  . Fluoxetine Hcl Nausea And Vomiting  . Hydrocodone Nausea And Vomiting  . Shellfish Allergy Nausea And Vomiting    Makes the patient feel "sick"  . Tape Other (See Comments)    Tears and bruises; please use Coban wrap  . Diclofenac Sodium Nausea And Vomiting  . Latex Rash   Current Outpatient Medications on File Prior to Visit  Medication Sig Dispense Refill  . acetaminophen (TYLENOL) 325 MG tablet Take 650 mg by mouth every 6 (six) hours as needed for mild pain.    Marland Kitchen amLODipine (NORVASC) 10 MG tablet Take 1 tablet (10 mg total) by mouth daily. 30 tablet 11  . aspirin 325 MG tablet Take 325 mg by mouth daily.     Marland Kitchen  calcium carbonate (OS-CAL) 600 MG TABS tablet Take 600 mg by mouth daily with breakfast.     . escitalopram (LEXAPRO) 10 MG tablet Take 1 tablet (10 mg total) by mouth daily. 30 tablet 11  . metoprolol tartrate (LOPRESSOR) 50 MG tablet Take 50 mg by mouth 2 (two) times daily.    Marland Kitchen NUTRITIONAL SUPPLEMENT LIQD Take 120 mLs by mouth 3 (three) times daily.     . Nutritional Supplements (NUTRITIONAL SUPPLEMENT PO) Take 1 each by mouth. Magic Cup on trays    . ranitidine (ZANTAC) 150 MG capsule Take 1 capsule (150 mg total) by mouth 2 (two) times daily. 60 capsule 11  . senna-docusate (SENNA-S) 8.6-50 MG tablet TAKE TWO (2) TABLETS BY MOUTH 2 TIMES DAILY FOR CONSTIPATION 120 tablet 0  . vitamin C (ASCORBIC ACID) 500 MG tablet Take 500 mg by mouth daily.      No current facility-administered medications on file prior to visit.      Review of Systems  Constitutional: Positive for fatigue. Negative for activity change, appetite change, fever and unexpected weight change.  HENT: Negative for congestion, ear pain, rhinorrhea, sinus pressure and sore throat.   Eyes: Negative for pain, redness and visual disturbance.  Respiratory: Negative for cough, shortness of breath and wheezing.   Cardiovascular:  Negative for chest pain and palpitations.  Gastrointestinal: Negative for abdominal distention, abdominal pain, blood in stool, constipation and diarrhea.  Endocrine: Negative for polydipsia and polyuria.  Genitourinary: Negative for dysuria, frequency and urgency.  Musculoskeletal: Positive for arthralgias, back pain and gait problem. Negative for myalgias.  Skin: Negative for pallor and rash.  Allergic/Immunologic: Negative for environmental allergies.  Neurological: Negative for dizziness, tremors, syncope, facial asymmetry, light-headedness, numbness and headaches.       Generalized weakness Not focal  Hematological: Negative for adenopathy. Does not bruise/bleed easily.  Psychiatric/Behavioral: Negative for decreased concentration and dysphoric mood. The patient is not nervous/anxious.        Some short term memory issues No confusion       Objective:   Physical Exam  Constitutional: She appears well-developed and well-nourished. No distress.  Frail appearing elderly female in wheelchair   HENT:  Head: Normocephalic and atraumatic.  Mouth/Throat: Oropharynx is clear and moist.  Eyes: Pupils are equal, round, and reactive to light. Conjunctivae and EOM are normal. Right eye exhibits no discharge. Left eye exhibits no discharge. No scleral icterus.  Neck: Normal range of motion. Neck supple. No JVD present. Carotid bruit is not present. No thyromegaly present.  Cardiovascular: Normal rate, regular rhythm, normal heart sounds and intact distal pulses. Exam reveals no gallop.  Pulmonary/Chest: Effort normal and breath sounds normal. No respiratory distress. She has no wheezes. She has no rales.  No crackles  Good air exch  Abdominal: Soft. Bowel sounds are normal. She exhibits no distension, no abdominal bruit and no mass. There is no tenderness. There is no rebound and no guarding.  No suprapubic tenderness or fullness  No cva tenderness   Musculoskeletal: She exhibits no edema or  deformity.  Lymphadenopathy:    She has no cervical adenopathy.  Neurological: She is alert. She has normal reflexes. She displays normal reflexes. No cranial nerve deficit. She exhibits normal muscle tone. Coordination normal.  req assistance to ambulate/or walker   Skin: Skin is warm and dry. No rash noted. No erythema. No pallor.  Psychiatric: She has a normal mood and affect.  Pleasant  Mentally sharp  Answers questions appropriately  Assessment & Plan:   Problem List Items Addressed This Visit      Cardiovascular and Mediastinum   Essential hypertension    Stable bp through hosp stay and presently  bp in fair control at this time  BP Readings from Last 1 Encounters:  12/21/17 114/68   No changes needed Most recent labs reviewed  Disc lifstyle change with low sodium diet and exercise   No changes  Lab today      Relevant Orders   CBC with Differential/Platelet   Comprehensive metabolic panel   TSH   Lipid panel     Digestive   Colon cancer H/O (Chronic)    Doing well /no re occurrence or symptoms        Endocrine   Type 2 diabetes mellitus without complication, without long-term current use of insulin (HCC)    A1C today  Recent infection and encephalopathy         Nervous and Auditory   Acute encephalopathy    With UTI - followed by pneumonia and rhabdomyalosis  Now recovered Neuro symptoms resolved  Reviewed hospital records, lab results and studies in detail        Neurocognitive disorder    Fairly mentally sharp today  Suspect some change from prior stroke  Reviewed hospital records, lab results and studies in detail          Musculoskeletal and Integument   Traumatic rhabdomyolysis ([place])    With elevated CK and troponin in hospital after lying on floor  Now resolved  Continue to follow renal fxn Reviewed hospital records, lab results and studies in detail   Has home PT and walker to prevent falls         Genitourinary    Acute renal failure superimposed on stage 2 chronic kidney disease ([place]) - Primary    From UTI and rhabdo  Reviewed hospital records, lab results and studies in detail  Now clinically improved  Has chronic CKD Lab today  Enc good fluid intake       Relevant Orders   Comprehensive metabolic panel   UTI (urinary tract infection)    Klebsiella uti again with volume depletion and fall  Reviewed hospital records, lab results and studies in detail   Clinically resolved  Unable to give urine sample today -will bring one back  Want to confirm resolved  Enc good fluid intake        Other   Hyperglycemia    Due for A1c today      Relevant Orders   Hemoglobin A1c   Hyperlipidemia LDL goal <100    Due for lipid panel today      Relevant Orders   Lipid panel   Risk for falls    Home PT was /is helpful  Home bound Continue this and walker use        Other Visit Diagnoses    Need for influenza vaccination       Relevant Orders   Flu Vaccine QUAD 6+ mos PF IM (Fluarix Quad PF) (Completed)

## 2017-12-21 NOTE — Assessment & Plan Note (Signed)
With elevated CK and troponin in hospital after lying on floor  Now resolved  Continue to follow renal fxn Reviewed hospital records, lab results and studies in detail   Has home PT and walker to prevent falls

## 2017-12-21 NOTE — Assessment & Plan Note (Signed)
Home PT was /is helpful  Home bound Continue this and walker use

## 2017-12-21 NOTE — Patient Instructions (Addendum)
Increase your PT to three times per day   Work very hard to keep up fluid intake  Fill up a pitcher to about 64 oz and work on that through the day   The more you move the better you will feel Also socialize  Keep going to church if you feel up to it   Labs today   We need to schedule your annual exam

## 2017-12-21 NOTE — Assessment & Plan Note (Signed)
Stable bp through hosp stay and presently  bp in fair control at this time  BP Readings from Last 1 Encounters:  12/21/17 114/68   No changes needed Most recent labs reviewed  Disc lifstyle change with low sodium diet and exercise   No changes  Lab today

## 2017-12-21 NOTE — Assessment & Plan Note (Signed)
A1C today  Recent infection and encephalopathy

## 2017-12-21 NOTE — Assessment & Plan Note (Signed)
Doing well /no re occurrence or symptoms

## 2017-12-21 NOTE — Assessment & Plan Note (Signed)
From UTI and rhabdo  Reviewed hospital records, lab results and studies in detail  Now clinically improved  Has chronic CKD Lab today  Enc good fluid intake

## 2017-12-21 NOTE — Assessment & Plan Note (Signed)
Fairly mentally sharp today  Suspect some change from prior stroke  Reviewed hospital records, lab results and studies in detail

## 2017-12-21 NOTE — Assessment & Plan Note (Signed)
Klebsiella uti again with volume depletion and fall  Reviewed hospital records, lab results and studies in detail   Clinically resolved  Unable to give urine sample today -will bring one back  Want to confirm resolved  Enc good fluid intake

## 2017-12-22 ENCOUNTER — Other Ambulatory Visit: Payer: Medicare Other

## 2017-12-22 LAB — POC URINALSYSI DIPSTICK (AUTOMATED)
BILIRUBIN UA: NEGATIVE
Blood, UA: NEGATIVE
GLUCOSE UA: NEGATIVE
KETONES UA: NEGATIVE
Nitrite, UA: NEGATIVE
PH UA: 6 (ref 5.0–8.0)
Protein, UA: NEGATIVE
Spec Grav, UA: 1.015 (ref 1.010–1.025)
Urobilinogen, UA: 0.2 E.U./dL

## 2017-12-22 LAB — CBC WITH DIFFERENTIAL/PLATELET
Basophils Absolute: 0.1 10*3/uL (ref 0.0–0.1)
Basophils Relative: 0.9 % (ref 0.0–3.0)
EOS ABS: 0.2 10*3/uL (ref 0.0–0.7)
Eosinophils Relative: 2.3 % (ref 0.0–5.0)
HCT: 32.7 % — ABNORMAL LOW (ref 36.0–46.0)
Hemoglobin: 10.7 g/dL — ABNORMAL LOW (ref 12.0–15.0)
LYMPHS ABS: 1.8 10*3/uL (ref 0.7–4.0)
Lymphocytes Relative: 21.9 % (ref 12.0–46.0)
MCHC: 32.9 g/dL (ref 30.0–36.0)
MCV: 86.9 fl (ref 78.0–100.0)
Monocytes Absolute: 0.6 10*3/uL (ref 0.1–1.0)
Monocytes Relative: 7.1 % (ref 3.0–12.0)
NEUTROS ABS: 5.4 10*3/uL (ref 1.4–7.7)
NEUTROS PCT: 67.8 % (ref 43.0–77.0)
PLATELETS: 386 10*3/uL (ref 150.0–400.0)
RBC: 3.76 Mil/uL — ABNORMAL LOW (ref 3.87–5.11)
RDW: 16.3 % — AB (ref 11.5–15.5)
WBC: 8 10*3/uL (ref 4.0–10.5)

## 2017-12-22 LAB — TSH: TSH: 2.75 u[IU]/mL (ref 0.35–4.50)

## 2017-12-22 LAB — COMPREHENSIVE METABOLIC PANEL
ALT: 8 U/L (ref 0–35)
AST: 20 U/L (ref 0–37)
Albumin: 3.9 g/dL (ref 3.5–5.2)
Alkaline Phosphatase: 55 U/L (ref 39–117)
BUN: 21 mg/dL (ref 6–23)
CHLORIDE: 101 meq/L (ref 96–112)
CO2: 26 meq/L (ref 19–32)
Calcium: 9.9 mg/dL (ref 8.4–10.5)
Creatinine, Ser: 1.47 mg/dL — ABNORMAL HIGH (ref 0.40–1.20)
GFR: 43.2 mL/min — AB (ref >60.00)
GLUCOSE: 87 mg/dL (ref 70–99)
Potassium: 4.2 mEq/L (ref 3.5–5.1)
Sodium: 134 mEq/L — ABNORMAL LOW (ref 135–145)
Total Bilirubin: 0.5 mg/dL (ref 0.2–1.2)
Total Protein: 7.6 g/dL (ref 6.0–8.3)

## 2017-12-22 LAB — LDL CHOLESTEROL, DIRECT: Direct LDL: 75 mg/dL

## 2017-12-22 LAB — HEMOGLOBIN A1C: Hgb A1c MFr Bld: 5.4 % (ref 4.6–6.5)

## 2017-12-22 LAB — LIPID PANEL
CHOL/HDL RATIO: 5
Cholesterol: 195 mg/dL (ref 0–200)
HDL: 40 mg/dL (ref >39.00)
NONHDL: 155.26
TRIGLYCERIDES: 254 mg/dL — AB (ref 0.0–149.0)
VLDL: 50.8 mg/dL — AB (ref 0.0–40.0)

## 2017-12-22 NOTE — Addendum Note (Signed)
Addended by: Tammi Sou on: 12/22/2017 12:56 PM   Modules accepted: Orders

## 2017-12-23 ENCOUNTER — Other Ambulatory Visit (INDEPENDENT_AMBULATORY_CARE_PROVIDER_SITE_OTHER): Payer: Medicare Other

## 2017-12-23 ENCOUNTER — Encounter: Payer: Self-pay | Admitting: *Deleted

## 2017-12-23 DIAGNOSIS — D649 Anemia, unspecified: Secondary | ICD-10-CM

## 2017-12-23 LAB — FERRITIN: FERRITIN: 142.4 ng/mL (ref 10.0–291.0)

## 2017-12-24 LAB — URINE CULTURE
MICRO NUMBER:: 91094435
SPECIMEN QUALITY:: ADEQUATE

## 2017-12-26 ENCOUNTER — Telehealth: Payer: Self-pay | Admitting: *Deleted

## 2017-12-26 MED ORDER — CEPHALEXIN 250 MG PO CAPS: 250.0000 mg | ORAL_CAPSULE | Freq: Two times a day (BID) | ORAL | 0 refills | Status: DC

## 2017-12-26 NOTE — Telephone Encounter (Signed)
Rx sent to pharmacy   

## 2017-12-26 NOTE — Telephone Encounter (Signed)
-----   Message from Abner Greenspan, MD sent at 12/24/2017  3:31 PM EDT ----- You still have klebsiella in urine - unsure if true infection or if colonized at this time Please send in keflex 250 mg 1 po bid for 7d #14 no ref  Re check urine cx a week after finishing that  Thanks

## 2017-12-28 ENCOUNTER — Ambulatory Visit: Payer: Self-pay | Admitting: Family Medicine

## 2017-12-28 NOTE — Telephone Encounter (Signed)
Niece (who is with pt) called back and is with pt. Pt did her normal morning activities and after sitting back down in her recliner, she began working on her iPad. Shortly after, her caregiver call pt's niece and stated that pt was not acting "right." Pt stated that she had a "pain" but could not recall where her pain was located. Pt stated that she has a "funny feeling in my head." Later, pt stated that she was having dizziness. Pt stated she has a headache. Pt denies weakness or numbness on either side of her face, arms, or legs. Per niece, pt's smile was even and pt is alert and oriented. Per niece pt is not having slurred speech, chest pain, or vertigo. Pt['s heart rate was 52. Per last office visit her heart rate was 50. Pt is currently being treated for a UTI and is on antibiotics.  Denies chest pain, fever, vomiting, diarrhea or bleeding. Asked niece and caregiver to assist pt to lay back with her legs elevated in her recliner.  Called back to check on pt. Pt's niece stated the pt stated she was feeling better and acting herself.  Care advice given and niece verbalized understanding. Appointment made for evaluation tomorrow at 11:15 with Dr Glori Bickers. Forwarding to office for Dr Glori Bickers to review.  Reason for Disposition . [1] MILD dizziness (e.g., walking normally) AND [2] has NOT been evaluated by physician for this  (Exception: dizziness caused by heat exposure, sudden standing, or poor fluid intake)  Answer Assessment - Initial Assessment Questions 1. DESCRIPTION: "Describe your dizziness."     "headd doesn't feel right" 2. LIGHTHEADED: "Do you feel lightheaded?" (e.g., somewhat faint, woozy, weak upon standing)     yes 3. VERTIGO: "Do you feel like either you or the room is spinning or tilting?" (i.e. vertigo)     no 4. SEVERITY: "How bad is it?"  "Do you feel like you are going to faint?" "Can you stand and walk?"   - MILD - walking normally   - MODERATE - interferes with normal activities  (e.g., work, school)    - SEVERE - unable to stand, requires support to walk, feels like passing out now.      mild 5. ONSET:  "When did the dizziness begin?"     Today at 1100 was working with  I pad then dizziness came on 6. AGGRAVATING FACTORS: "Does anything make it worse?" (e.g., standing, change in head position)     was working on Ipad 7. HEART RATE: "Can you tell me your heart rate?" "How many beats in 15 seconds?"  (Note: not all patients can do this)      52 per minute 8. CAUSE: "What do you think is causing the dizziness?"   Pt doesn't know 9. RECURRENT SYMPTOM: "Have you had dizziness before?" If so, ask: "When was the last time?" "What happened that time?"     no 10. OTHER SYMPTOMS: "Do you have any other symptoms?" (e.g., fever, chest pain, vomiting, diarrhea, bleeding)   Headache, had a pain just before but cannot remember where 11. PREGNANCY: "Is there any chance you are pregnant?" "When was your last menstrual period?"       n/a  Protocols used: DIZZINESS [facility]

## 2017-12-28 NOTE — Telephone Encounter (Signed)
I will see her then as planned

## 2017-12-28 NOTE — Telephone Encounter (Signed)
Summary: call back   Niece [name] calling because the caregiver of the pt called her and states the pt is "not herself". [name] is not with the pt, but would like someone to call her and discuss her symptoms. [name] is on the dpr     Niece is on her way to be with pt. Will call back when she gets there.

## 2017-12-29 ENCOUNTER — Encounter: Payer: Self-pay | Admitting: Family Medicine

## 2017-12-29 ENCOUNTER — Ambulatory Visit: Payer: Medicare Other | Admitting: Family Medicine

## 2017-12-29 VITALS — BP 118/68 | HR 47 | Temp 98.2°F | Ht 70.0 in

## 2017-12-29 DIAGNOSIS — R51 Headache: Secondary | ICD-10-CM

## 2017-12-29 DIAGNOSIS — N182 Chronic kidney disease, stage 2 (mild): Secondary | ICD-10-CM | POA: Diagnosis not present

## 2017-12-29 DIAGNOSIS — D649 Anemia, unspecified: Secondary | ICD-10-CM

## 2017-12-29 DIAGNOSIS — I10 Essential (primary) hypertension: Secondary | ICD-10-CM

## 2017-12-29 DIAGNOSIS — G8929 Other chronic pain: Secondary | ICD-10-CM

## 2017-12-29 DIAGNOSIS — R001 Bradycardia, unspecified: Secondary | ICD-10-CM | POA: Diagnosis not present

## 2017-12-29 DIAGNOSIS — I671 Cerebral aneurysm, nonruptured: Secondary | ICD-10-CM | POA: Diagnosis not present

## 2017-12-29 DIAGNOSIS — N179 Acute kidney failure, unspecified: Secondary | ICD-10-CM | POA: Diagnosis not present

## 2017-12-29 DIAGNOSIS — N390 Urinary tract infection, site not specified: Secondary | ICD-10-CM

## 2017-12-29 DIAGNOSIS — H34231 Retinal artery branch occlusion, right eye: Secondary | ICD-10-CM

## 2017-12-29 LAB — CBC WITH DIFFERENTIAL/PLATELET
BASOS PCT: 1.6 % (ref 0.0–3.0)
Basophils Absolute: 0.1 10*3/uL (ref 0.0–0.1)
EOS PCT: 3 % (ref 0.0–5.0)
Eosinophils Absolute: 0.2 10*3/uL (ref 0.0–0.7)
HEMATOCRIT: 32.8 % — AB (ref 36.0–46.0)
Hemoglobin: 10.7 g/dL — ABNORMAL LOW (ref 12.0–15.0)
Lymphocytes Relative: 25 % (ref 12.0–46.0)
Lymphs Abs: 1.6 10*3/uL (ref 0.7–4.0)
MCHC: 32.5 g/dL (ref 30.0–36.0)
MCV: 86.2 fl (ref 78.0–100.0)
MONOS PCT: 7.2 % (ref 3.0–12.0)
Monocytes Absolute: 0.5 10*3/uL (ref 0.1–1.0)
NEUTROS ABS: 4 10*3/uL (ref 1.4–7.7)
Neutrophils Relative %: 63.2 % (ref 43.0–77.0)
PLATELETS: 368 10*3/uL (ref 150.0–400.0)
RBC: 3.81 Mil/uL — ABNORMAL LOW (ref 3.87–5.11)
RDW: 16.4 % — ABNORMAL HIGH (ref 11.5–15.5)
WBC: 6.3 10*3/uL (ref 4.0–10.5)

## 2017-12-29 LAB — BASIC METABOLIC PANEL
BUN: 19 mg/dL (ref 6–23)
CALCIUM: 9.8 mg/dL (ref 8.4–10.5)
CHLORIDE: 103 meq/L (ref 96–112)
CO2: 27 mEq/L (ref 19–32)
CREATININE: 1.5 mg/dL — AB (ref 0.40–1.20)
GFR: 42.2 mL/min — ABNORMAL LOW (ref >60.00)
Glucose, Bld: 104 mg/dL — ABNORMAL HIGH (ref 70–99)
Potassium: 4.2 mEq/L (ref 3.5–5.1)
Sodium: 137 mEq/L (ref 135–145)

## 2017-12-29 NOTE — Assessment & Plan Note (Signed)
Worse lately  Recent hosp- urosepsis  Hx of cerebral aneurysm- (choose not to eval or tx further)  Low threshold for MRI/MRA if worse Rev old neuro notes Close bo baseline today with nl neuro exam

## 2017-12-29 NOTE — Assessment & Plan Note (Signed)
Suspect due to chronic dz/renal insuff  Ferritin nl Cbc and path rev today

## 2017-12-29 NOTE — Assessment & Plan Note (Signed)
Seen 2017 and pt /family choose not to pursue further  Saw neuro but declined neurosurg ref  Report from MRA  IMPRESSION:  This MR angiogram of the intracranial arteries shows the following: 1.   67 mm bilobed aneurysm directed inferiorly arising from the supraclinoid segment of the internal carotid artery. 2.   Mild, nonhemodynamically significant, stenosis of the distal left vertebral artery. 3.   There is common variant anatomy with the right posterior cerebral artery deriving its flow from the anterior circulation through the posterior communicating artery

## 2017-12-29 NOTE — Assessment & Plan Note (Signed)
Also some acute on chronic HA For opthy f/u soon

## 2017-12-29 NOTE — Assessment & Plan Note (Signed)
HR of 47  May cause dizziness and fatigue bp is ok  Will hold metoprolol and update

## 2017-12-29 NOTE — Assessment & Plan Note (Signed)
Klebsiella May be colonized  Pending cx  Also bun/cr Enc fluids

## 2017-12-29 NOTE — Assessment & Plan Note (Signed)
Worsened by uti  Working on fluid intake Anemic Lab today

## 2017-12-29 NOTE — Progress Notes (Signed)
Subjective:    Patient ID: Erin [name], female    DOB: 15-Dec-1928, 84 y.o.   MRN: [id number]  HPI Here for dizziness   Call came in yesterday stating pt had ? Pain and funny feeling in head followed by dizziness and then a headache (tylenol helped a lot)  No other neuro symptoms  HR was 52  Felt better /acting like herself later in the day   Overall improved but some headache (no tylenol today)  This does not feel like her usual headache  Today head feels full  No throbbing  Not sensitive to light  No stroke symptoms   No congestion or uri symptoms   No urinary frequency or dysuria  Tried to give a sample- could not  May try again while here  Working on a pitcher of water so she can get more in (has had about 1/2 today)   Wt Readings from Last 3 Encounters:  12/21/17 184 lb (83.5 kg)  08/09/17 192 lb 12.8 oz (87.5 kg)  08/04/17 193 lb (87.5 kg)   26.40 kg/m   Pulse is lower today at 47 Pulse Readings from Last 3 Encounters:  12/29/17 (!) 47  12/21/17 (!) 50  08/09/17 70   She does take metoprolol tartrate 50 mg bid   BP Readings from Last 3 Encounters:  12/29/17 118/68  12/21/17 114/68  08/09/17 136/80   Hx of unspecified neurocognitive disorder -(better with tx of infection)   Also brain aneurysm Also CVA (TIAs) Also retinal vein occlusion Also chronic headaches   Recent uti - Klebsiella (or colonized) On keflex for this   Most recent labs Lab Results  Component Value Date   CREATININE 1.47 (H) 12/21/2017   BUN 21 12/21/2017   NA 134 (L) 12/21/2017   K 4.2 12/21/2017   CL 101 12/21/2017   CO2 26 12/21/2017   Lab Results  Component Value Date   ALT 8 12/21/2017   AST 20 12/21/2017   ALKPHOS 55 12/21/2017   BILITOT 0.5 12/21/2017   Lab Results  Component Value Date   WBC 8.0 12/21/2017   HGB 10.7 (L) 12/21/2017   HCT 32.7 (L) 12/21/2017   MCV 86.9 12/21/2017   PLT 386.0 12/21/2017   Lab Results  Component Value Date   TSH 2.75  12/21/2017    Lab Results  Component Value Date   CHOL 195 12/21/2017   HDL 40.00 12/21/2017   LDLCALC 89 01/28/2017   LDLDIRECT 75.0 12/21/2017   TRIG 254.0 (H) 12/21/2017   CHOLHDL 5 12/21/2017   Lab Results  Component Value Date   FERRITIN 142.4 12/23/2017   Has eye doctor appt upcoming on 3rd   Patient Active Problem List   Diagnosis Date Noted  . Bradycardia 12/29/2017  . Type 2 diabetes mellitus without complication, without long-term current use of insulin ([place]) 12/21/2017  . Traumatic rhabdomyolysis ([place]) 08/04/2017  . Neurocognitive disorder 08/04/2017  . Hypercalcemia 07/29/2017  . Retinal artery occlusion, branch, right 03/11/2017  . UTI (urinary tract infection) 03/11/2017  . Chronic headache 03/11/2017  . Eczema 01/02/2016  . Brain aneurysm 10/22/2015  . Abnormality of gait 09/23/2015  . Acute encephalopathy   . H/O: CVA (cerebrovascular accident)   . Goals of care, counseling/discussion   . Slow transit constipation   . Risk for falls 07/05/2012  . Acute renal failure superimposed on stage 2 chronic kidney disease ([place]) 04/16/2012  . Colon cancer H/O 04/07/2012  . Sacroiliac joint dysfunction 04/01/2012  .  Sciatica 03/31/2012  . Low back pain 04/02/2011  . Other screening mammogram 09/30/2010  . Hyperglycemia 09/30/2010  . Post-menopausal 09/30/2010  . ABDOMINAL WALL HERNIA 02/26/2010  . Mild anemia 08/18/2009  . Essential hypertension 10/31/2007  . Hyperlipidemia LDL goal <100 07/26/2006  . HEMORRHOIDS 07/26/2006  . OSTEOARTHRITIS 07/26/2006  . Osteopenia 07/26/2006  . Disturbance in sleep behavior 07/26/2006   Past Medical History:  Diagnosis Date  . Diabetes mellitus   . Encephalopathy   . Hyperlipidemia   . Hypertension   . Memory loss   . Obesity   . Osteoarthritis   . Osteoporosis   . Sacroiliac joint dysfunction 04/01/2012  . 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  APPENDECTOMY    . cervicitis  1964   conization   . CHOLECYSTECTOMY    . COLON RESECTION  1988   secondary to cancer  . endometrial polyp  05/2000   hyperplasia-laser treatment  . JOINT REPLACEMENT     Rt knee replacement  . POLYPECTOMY  1998   benign X 2  . TOTAL KNEE ARTHROPLASTY  07/2009   right    Social History   Tobacco Use  . Smoking status: Former Smoker    Types: Cigarettes    Last attempt to quit: 08/14/1962    Years since quitting: 55.4  . Smokeless tobacco: Never Used  . Tobacco comment: Quit over 10 years ago  Substance Use Topics  . Alcohol use: No    Alcohol/week: 0.0 standard drinks  . Drug use: No   Family History  Problem Relation Age of Onset  . Kidney failure Mother   . Stroke Father   . Cancer Brother        lung   Allergies  Allergen Reactions  . Fluoxetine Hcl Nausea And Vomiting  . Hydrocodone Nausea And Vomiting  . Shellfish Allergy Nausea And Vomiting    Makes the patient feel "sick"  . Tape Other (See Comments)    Tears and bruises; please use Coban wrap  . Diclofenac Sodium Nausea And Vomiting  . Latex Rash   Current Outpatient Medications on File Prior to Visit  Medication Sig Dispense Refill  . acetaminophen (TYLENOL) 325 MG tablet Take 650 mg by mouth every 6 (six) hours as needed for mild pain.    Marland Kitchen amLODipine (NORVASC) 10 MG tablet Take 1 tablet (10 mg total) by mouth daily. 30 tablet 11  . aspirin 325 MG tablet Take 325 mg by mouth daily.     . calcium carbonate (OS-CAL) 600 MG TABS tablet Take 600 mg by mouth daily with breakfast.     . cephALEXin (KEFLEX) 250 MG capsule Take 1 capsule (250 mg total) by mouth 2 (two) times daily. 14 capsule 0  . escitalopram (LEXAPRO) 10 MG tablet Take 1 tablet (10 mg total) by mouth daily. 30 tablet 11  . NUTRITIONAL SUPPLEMENT LIQD Take 120 mLs by mouth 3 (three) times daily.     . Nutritional Supplements (NUTRITIONAL SUPPLEMENT PO) Take 1 each by mouth. Magic Cup on trays    . ranitidine  (ZANTAC) 150 MG capsule Take 1 capsule (150 mg total) by mouth 2 (two) times daily. 60 capsule 11  . senna-docusate (SENNA-S) 8.6-50 MG tablet TAKE TWO (2) TABLETS BY MOUTH 2 TIMES DAILY FOR CONSTIPATION 120 tablet 0  . vitamin C (ASCORBIC ACID) 500 MG tablet Take 500 mg by mouth daily.  No current facility-administered medications on file prior to visit.      Review of Systems  Constitutional: Positive for fatigue. Negative for activity change, appetite change, fever and unexpected weight change.  HENT: Negative for congestion, ear pain, rhinorrhea, sinus pressure and sore throat.   Eyes: Negative for pain, redness and visual disturbance.  Respiratory: Negative for cough, shortness of breath, wheezing and stridor.   Cardiovascular: Negative for chest pain and palpitations.  Gastrointestinal: Negative for abdominal pain, blood in stool, constipation and diarrhea.  Endocrine: Negative for polydipsia and polyuria.  Genitourinary: Negative for dysuria, frequency and urgency.  Musculoskeletal: Positive for arthralgias. Negative for back pain and myalgias.  Skin: Negative for pallor and rash.  Allergic/Immunologic: Negative for environmental allergies.  Neurological: Positive for headaches. Negative for dizziness, tremors, seizures, syncope, facial asymmetry, speech difficulty, weakness, light-headedness and numbness.       No longer dizzy or light headed today  Hematological: Negative for adenopathy. Does not bruise/bleed easily.  Psychiatric/Behavioral: Negative for decreased concentration and dysphoric mood. The patient is not nervous/anxious.        Objective:   Physical Exam  Constitutional: She appears well-developed and well-nourished. No distress.  Frail appearing elderly female in wheelchair  In good spirits  HENT:  Head: Normocephalic and atraumatic.  Nose: Nose normal.  Mouth/Throat: Oropharynx is clear and moist.  Eyes: Pupils are equal, round, and reactive to light.  Conjunctivae and EOM are normal. Right eye exhibits no discharge. Left eye exhibits no discharge. No scleral icterus.  Neck: Normal range of motion. Neck supple. No JVD present. Carotid bruit is not present. No tracheal deviation present. No thyromegaly present.  Cardiovascular: Regular rhythm, normal heart sounds and intact distal pulses. Exam reveals no gallop.  Pulmonary/Chest: Effort normal and breath sounds normal. No respiratory distress. She has no wheezes. She has no rales.  No crackles  Abdominal: Soft. Bowel sounds are normal. She exhibits no distension, no abdominal bruit and no mass. There is no tenderness.  Musculoskeletal: She exhibits no edema, tenderness or deformity.  Lymphadenopathy:    She has no cervical adenopathy.  Neurological: She is alert. She has normal strength and normal reflexes. She displays no atrophy, no tremor and normal reflexes. No cranial nerve deficit or sensory deficit. She exhibits normal muscle tone. Coordination and gait normal.  No focal cerebellar signs   Skin: Skin is warm and dry. No rash noted. No erythema. No pallor.  Psychiatric: She has a normal mood and affect.  Jokes around with her family           Assessment & Plan:   Problem List Items Addressed This Visit      Cardiovascular and Mediastinum   Brain aneurysm    Seen 2017 and pt /family choose not to pursue further  Saw neuro but declined neurosurg ref  Report from [facility]  IMPRESSION:  This MR angiogram of the intracranial arteries shows the following: 1.   67 mm bilobed aneurysm directed inferiorly arising from the supraclinoid segment of the internal carotid artery. 2.   Mild, nonhemodynamically significant, stenosis of the distal left vertebral artery. 3.   There is common variant anatomy with the right posterior cerebral artery deriving its flow from the anterior circulation through the posterior communicating artery      Essential hypertension    BP: 118/68   Pulse  47-this may cause her dizziness Will hold the metoprolol for now and report back       Relevant Orders  CBC with Differential/Platelet (Completed)   Basic metabolic panel (Completed)   Retinal artery occlusion, branch, right    Also some acute on chronic HA For opthy f/u soon        Genitourinary   Acute renal failure superimposed on stage 2 chronic kidney disease ([place])    Worsened by uti  Working on fluid intake Anemic Lab today      Relevant Orders   CBC with Differential/Platelet (Completed)   Basic metabolic panel (Completed)   Urine Culture   UTI (urinary tract infection)    Klebsiella May be colonized  Pending cx  Also bun/cr Enc fluids       Relevant Orders   Urine Culture     Other   Bradycardia    HR of 47  May cause dizziness and fatigue bp is ok  Will hold metoprolol and update       Chronic headache - Primary    Worse lately  Recent hosp- urosepsis  Hx of cerebral aneurysm- (choose not to eval or tx further)  Low threshold for MRI/MRA if worse Rev old neuro notes Close bo baseline today with nl neuro exam        Mild anemia    Suspect due to chronic dz/renal insuff  Ferritin nl Cbc and path rev today       Relevant Orders   CBC with Differential/Platelet (Completed)   Pathologist smear review

## 2017-12-29 NOTE — Assessment & Plan Note (Signed)
BP: 118/68   Pulse 47-this may cause her dizziness Will hold the metoprolol for now and report back

## 2017-12-29 NOTE — Patient Instructions (Signed)
Keep up fluid intake   See the eye doctor as planned   Try and give a urine sample today -- if not able bring one in after finishing the keflex   Labs today for anemia   Hold the metoprolol-it may be lowering pulse too much and causing dizziness  Tylenol is ok for headache but if severe please let me know   If symptoms do not improve we may consider another MRI

## 2017-12-30 ENCOUNTER — Telehealth: Payer: Self-pay

## 2017-12-30 LAB — URINE CULTURE
MICRO NUMBER: 91126396
SPECIMEN QUALITY: ADEQUATE

## 2017-12-30 LAB — PATHOLOGIST SMEAR REVIEW

## 2017-12-30 NOTE — Telephone Encounter (Signed)
Copied from [place] [phone]. Topic: General - Other >> Dec 30, 2017  4:18 PM Oneta Rack wrote: Caller name: Mrs. Wynetta Emery  Relation to pt: RN from [facility]   Call back number: [phone]    Reason for call:  Nurse wanted to inform PCP patient BP 152/62 since patient blood pressure was d/c yesterday as per RN. Requesting verbal orders for skilled nursing and home health aid  2x 1, patient experiencing slight head ache but RN stated nothing to be concern about, please advise

## 2017-12-30 NOTE — Telephone Encounter (Signed)
Verbal order given and also advise of Dr. Marliss Coots instructions regarding BP

## 2017-12-30 NOTE — Telephone Encounter (Signed)
Please continue to watch bp and hold the medicine for now  Please update me next week re: pulse rate/BP / headache status and if any dizziness-thanks  Please ok the verbal skilled nursing orders

## 2018-01-02 ENCOUNTER — Other Ambulatory Visit: Payer: Self-pay

## 2018-01-02 DIAGNOSIS — N179 Acute kidney failure, unspecified: Secondary | ICD-10-CM

## 2018-01-02 DIAGNOSIS — N182 Chronic kidney disease, stage 2 (mild): Principal | ICD-10-CM

## 2018-01-02 NOTE — Telephone Encounter (Signed)
Left VM requesting Erin [name] to call office back

## 2018-01-02 NOTE — Telephone Encounter (Signed)
Copied from [place] [phone]. Topic: Quick Communication - See Telephone Encounter >> Jan 02, 2018  1:23 PM Antonieta Iba C wrote: CRM for notification. See Telephone encounter for: 01/02/18.  Pt BP 166/58  today.   Kendris RN with [facility] : [phone]

## 2018-01-02 NOTE — Telephone Encounter (Signed)
Please ask how she is feeling Thanks

## 2018-01-03 MED ORDER — LISINOPRIL 5 MG PO TABS: 5.0000 mg | ORAL_TABLET | Freq: Every day | ORAL | 11 refills | Status: DC

## 2018-01-03 NOTE — Telephone Encounter (Signed)
Spoke with [name] and she said that pt's dizziness and HA are better. She did let me know that pt ate some pre-package grits that may have been high in sodium before she had her BP taken.

## 2018-01-03 NOTE — Telephone Encounter (Signed)
See lab results where [name] gave an update on how pt's feeling

## 2018-01-03 NOTE — Telephone Encounter (Signed)
Erin [name] notified of Dr. Marliss Coots comments and instructions and verbalized understanding. Rx sent to pharmacy and Erin [name] will have nurse update [place] with BP readings once she starts med

## 2018-01-03 NOTE — Telephone Encounter (Signed)
Pt's niece Erin [name] states she missed a call from the office.  States that pt is having dull headaches today and that her blood pressure was high yesterday.  Erin [name] is requesting to speak with Shapale.  Erin [name] can be reached at [phone].

## 2018-01-03 NOTE — Telephone Encounter (Signed)
I do not want her bp to stay that high  I also do not want to re start metoprolol - as the low pulse caused dizziness/malaise and headache   She is already on amlodipine   Have to be careful of kidneys re: what medicine we give  Please have her start low dose lisinopril 5 mg daily  I pended the px- unsure which pharmacy she prefers-please send it   She has a lab appt still on the books for 9/30  I will order a renal panel   Please have the home care staff update me with blood pressures Thanks

## 2018-01-03 NOTE — Telephone Encounter (Signed)
I cut her bp medicine because pulse was slow at last visit  Is dizziness improved without it ?  How is the headache ?   Thanks

## 2018-01-06 ENCOUNTER — Telehealth: Payer: Self-pay | Admitting: Family Medicine

## 2018-01-06 NOTE — Telephone Encounter (Signed)
Copied from [place] [id number]. Topic: Quick Communication - See Telephone Encounter >> Jan 06, 2018  4:20 PM Rutherford Nail, [place] wrote: CRM for notification. See Telephone encounter for: 01/06/18. Kendrice with [facility] calling to request verbal orders for skilled nursing and home health aid to assist with bathing 2x a week for 3 weeks  Also, what dose of Asprin is patient supposed to be taking? CB#: [phone]

## 2018-01-06 NOTE — Telephone Encounter (Signed)
Please verbally ok that order   325 mg of aspirin

## 2018-01-07 ENCOUNTER — Telehealth: Payer: Self-pay | Admitting: Family Medicine

## 2018-01-07 DIAGNOSIS — D649 Anemia, unspecified: Secondary | ICD-10-CM

## 2018-01-07 DIAGNOSIS — N179 Acute kidney failure, unspecified: Secondary | ICD-10-CM

## 2018-01-07 DIAGNOSIS — N182 Chronic kidney disease, stage 2 (mild): Secondary | ICD-10-CM

## 2018-01-07 DIAGNOSIS — I10 Essential (primary) hypertension: Secondary | ICD-10-CM

## 2018-01-07 NOTE — Telephone Encounter (Signed)
-----   Message from Lendon Collar, RT sent at 01/02/2018  9:38 AM EDT ----- Regarding: Lab orders for Monday 01/09/18 Patient is on the lab schedule for urine recheck and labs. Please enter orders for 01/09/18. Thanks!

## 2018-01-09 ENCOUNTER — Telehealth: Payer: Self-pay | Admitting: Family Medicine

## 2018-01-09 ENCOUNTER — Other Ambulatory Visit (INDEPENDENT_AMBULATORY_CARE_PROVIDER_SITE_OTHER): Payer: Medicare Other

## 2018-01-09 DIAGNOSIS — N182 Chronic kidney disease, stage 2 (mild): Secondary | ICD-10-CM

## 2018-01-09 DIAGNOSIS — D649 Anemia, unspecified: Secondary | ICD-10-CM

## 2018-01-09 DIAGNOSIS — N179 Acute kidney failure, unspecified: Secondary | ICD-10-CM | POA: Diagnosis not present

## 2018-01-09 LAB — RENAL FUNCTION PANEL
ALBUMIN: 3.8 g/dL (ref 3.5–5.2)
BUN: 20 mg/dL (ref 6–23)
CALCIUM: 10 mg/dL (ref 8.4–10.5)
CO2: 28 mEq/L (ref 19–32)
CREATININE: 1.33 mg/dL — AB (ref 0.40–1.20)
Chloride: 101 mEq/L (ref 96–112)
GFR: 48.48 mL/min — ABNORMAL LOW (ref >60.00)
Glucose, Bld: 109 mg/dL — ABNORMAL HIGH (ref 70–99)
Phosphorus: 2.8 mg/dL (ref 2.3–4.6)
Potassium: 4.3 mEq/L (ref 3.5–5.1)
Sodium: 134 mEq/L — ABNORMAL LOW (ref 135–145)

## 2018-01-09 LAB — CBC WITH DIFFERENTIAL/PLATELET
BASOS ABS: 0.1 10*3/uL (ref 0.0–0.1)
Basophils Relative: 1.6 % (ref 0.0–3.0)
EOS ABS: 0.1 10*3/uL (ref 0.0–0.7)
Eosinophils Relative: 2 % (ref 0.0–5.0)
HCT: 33.1 % — ABNORMAL LOW (ref 36.0–46.0)
Hemoglobin: 10.8 g/dL — ABNORMAL LOW (ref 12.0–15.0)
LYMPHS ABS: 1.7 10*3/uL (ref 0.7–4.0)
Lymphocytes Relative: 25.9 % (ref 12.0–46.0)
MCHC: 32.6 g/dL (ref 30.0–36.0)
MCV: 85.7 fl (ref 78.0–100.0)
MONO ABS: 0.5 10*3/uL (ref 0.1–1.0)
MONOS PCT: 8.1 % (ref 3.0–12.0)
NEUTROS ABS: 4.1 10*3/uL (ref 1.4–7.7)
Neutrophils Relative %: 62.4 % (ref 43.0–77.0)
PLATELETS: 389 10*3/uL (ref 150.0–400.0)
RBC: 3.87 Mil/uL (ref 3.87–5.11)
RDW: 16.7 % — ABNORMAL HIGH (ref 11.5–15.5)
WBC: 6.6 10*3/uL (ref 4.0–10.5)

## 2018-01-09 NOTE — Telephone Encounter (Signed)
Copied from [place] [phone]. Topic: Quick Communication - See Telephone Encounter >> Jan 09, 2018 10:27 AM Conception Chancy, [place] wrote:  Melonie [place] the patient niece is calling and states she has question about why the patient is taking Ecotrin 325 mg. She states she has been taking this for a long time now but just wanted to know the reasoning why. She would like a call back from the nurse.

## 2018-01-09 NOTE — Telephone Encounter (Signed)
She has a combination of conditions increasing her risk for stroke and other cardiovascular conditions - including HTN, and DM  She has also had a retinal artery occlusion in the past (diagnosed by opthy)-which can be related to the above  Her MRIs and CT scans of the brain have shown expected small vessel ischemic dz in the brain   Hoping the aspirin may reduce her risk for cardiovascular events   If however- we think that risks of the aspirin outweigh the benefits- it can be stopped

## 2018-01-09 NOTE — Telephone Encounter (Signed)
Left VM letting her know the dose of asa and giving her the verbal orders

## 2018-01-09 NOTE — Telephone Encounter (Signed)
Erin [name] notified of Dr. Marliss Coots comments. Pt will keep taking med she just wasn't sure exactly why pt was on med but since question was answered pt will keep taking it

## 2018-01-12 LAB — HM DIABETES EYE EXAM

## 2018-01-30 ENCOUNTER — Telehealth: Payer: Self-pay | Admitting: *Deleted

## 2018-01-30 MED ORDER — FAMOTIDINE 20 MG PO TABS: 20.0000 mg | ORAL_TABLET | Freq: Two times a day (BID) | ORAL | 11 refills | Status: DC

## 2018-01-30 NOTE — Telephone Encounter (Signed)
Received fax from pharmacy saying pt's Rx for ranitidine was part of the recall and they need an alt med sent to pharmacy  [facility]

## 2018-01-30 NOTE — Telephone Encounter (Signed)
I sent in pepcid Take it twice daily  It is similar to the zantac  Let me know if any problems

## 2018-01-30 NOTE — Telephone Encounter (Signed)
[name] notified of recall of zantac and that new Rx was sent and advise of DR. Tower's comments

## 2018-01-31 DIAGNOSIS — Z85038 Personal history of other malignant neoplasm of large intestine: Secondary | ICD-10-CM

## 2018-01-31 DIAGNOSIS — F0390 Unspecified dementia without behavioral disturbance: Secondary | ICD-10-CM

## 2018-01-31 DIAGNOSIS — D631 Anemia in chronic kidney disease: Secondary | ICD-10-CM

## 2018-01-31 DIAGNOSIS — Z87440 Personal history of urinary (tract) infections: Secondary | ICD-10-CM

## 2018-01-31 DIAGNOSIS — Z8701 Personal history of pneumonia (recurrent): Secondary | ICD-10-CM

## 2018-01-31 DIAGNOSIS — Z9181 History of falling: Secondary | ICD-10-CM

## 2018-01-31 DIAGNOSIS — I1310 Hypertensive heart and chronic kidney disease without heart failure, with stage 1 through stage 4 chronic kidney disease, or unspecified chronic kidney disease: Secondary | ICD-10-CM

## 2018-01-31 DIAGNOSIS — E1122 Type 2 diabetes mellitus with diabetic chronic kidney disease: Secondary | ICD-10-CM | POA: Diagnosis not present

## 2018-01-31 DIAGNOSIS — F339 Major depressive disorder, recurrent, unspecified: Secondary | ICD-10-CM

## 2018-01-31 DIAGNOSIS — M1991 Primary osteoarthritis, unspecified site: Secondary | ICD-10-CM

## 2018-01-31 DIAGNOSIS — N183 Chronic kidney disease, stage 3 (moderate): Secondary | ICD-10-CM | POA: Diagnosis not present

## 2018-01-31 DIAGNOSIS — M810 Age-related osteoporosis without current pathological fracture: Secondary | ICD-10-CM

## 2018-04-13 ENCOUNTER — Telehealth: Payer: Self-pay

## 2018-04-13 NOTE — Telephone Encounter (Signed)
[name] called in (DPR signed) since 04/09/18 pt started with H/A and runny nose which is worse since 04/10/18. Since 04/12/18 pt started with dry cough, no fever and no wheezing or SOB. [name] will try the mucinex for cough and runny nose and scheduled appt on 04/14/18 at 12:30 (oked by Shapale) and if pt wakes up in morning with no symptoms [name] will call early to cancel appt otherwise will bring pt at 12:30 to be seen. FYI to Dr Glori Bickers.

## 2018-04-13 NOTE — Telephone Encounter (Signed)
I will see her then  

## 2018-04-14 ENCOUNTER — Ambulatory Visit: Payer: Medicare Other | Admitting: Family Medicine

## 2018-08-23 ENCOUNTER — Other Ambulatory Visit: Payer: Self-pay | Admitting: Family Medicine

## 2018-08-31 ENCOUNTER — Ambulatory Visit (INDEPENDENT_AMBULATORY_CARE_PROVIDER_SITE_OTHER): Payer: Medicare Other

## 2018-08-31 ENCOUNTER — Other Ambulatory Visit: Payer: Medicare Other

## 2018-08-31 ENCOUNTER — Telehealth: Payer: Self-pay | Admitting: Family Medicine

## 2018-08-31 ENCOUNTER — Telehealth: Payer: Self-pay

## 2018-08-31 ENCOUNTER — Other Ambulatory Visit (INDEPENDENT_AMBULATORY_CARE_PROVIDER_SITE_OTHER): Payer: Medicare Other

## 2018-08-31 DIAGNOSIS — I10 Essential (primary) hypertension: Secondary | ICD-10-CM

## 2018-08-31 DIAGNOSIS — E119 Type 2 diabetes mellitus without complications: Secondary | ICD-10-CM

## 2018-08-31 DIAGNOSIS — Z0000 Encounter for general adult medical examination without abnormal findings: Secondary | ICD-10-CM

## 2018-08-31 DIAGNOSIS — D649 Anemia, unspecified: Secondary | ICD-10-CM

## 2018-08-31 DIAGNOSIS — E785 Hyperlipidemia, unspecified: Secondary | ICD-10-CM

## 2018-08-31 DIAGNOSIS — D729 Disorder of white blood cells, unspecified: Secondary | ICD-10-CM

## 2018-08-31 LAB — TSH: TSH: 2.68 u[IU]/mL (ref 0.35–4.50)

## 2018-08-31 LAB — LIPID PANEL
Cholesterol: 201 mg/dL — ABNORMAL HIGH (ref 0–200)
HDL: 45.3 mg/dL (ref >39.00)
LDL Cholesterol: 124 mg/dL — ABNORMAL HIGH (ref 0–99)
NonHDL: 155.77
Total CHOL/HDL Ratio: 4
Triglycerides: 159 mg/dL — ABNORMAL HIGH (ref 0.0–149.0)
VLDL: 31.8 mg/dL (ref 0.0–40.0)

## 2018-08-31 LAB — COMPREHENSIVE METABOLIC PANEL
ALT: 8 U/L (ref 0–35)
AST: 22 U/L (ref 0–37)
Albumin: 4 g/dL (ref 3.5–5.2)
Alkaline Phosphatase: 54 U/L (ref 39–117)
BUN: 25 mg/dL — ABNORMAL HIGH (ref 6–23)
CO2: 27 mEq/L (ref 19–32)
Calcium: 9.9 mg/dL (ref 8.4–10.5)
Chloride: 104 mEq/L (ref 96–112)
Creatinine, Ser: 1.81 mg/dL — ABNORMAL HIGH (ref 0.40–1.20)
GFR: 31.92 mL/min — ABNORMAL LOW (ref >60.00)
Glucose, Bld: 98 mg/dL (ref 70–99)
Potassium: 4.6 mEq/L (ref 3.5–5.1)
Sodium: 139 mEq/L (ref 135–145)
Total Bilirubin: 0.5 mg/dL (ref 0.2–1.2)
Total Protein: 7.7 g/dL (ref 6.0–8.3)

## 2018-08-31 LAB — CBC WITH DIFFERENTIAL/PLATELET
Basophils Absolute: 1.2 10*3/uL — ABNORMAL HIGH (ref 0.0–0.1)
Basophils Relative: 3.5 % — ABNORMAL HIGH (ref 0.0–3.0)
Eosinophils Absolute: 0.6 10*3/uL (ref 0.0–0.7)
Eosinophils Relative: 1.9 % (ref 0.0–5.0)
HCT: 32.6 % — ABNORMAL LOW (ref 36.0–46.0)
Hemoglobin: 10.6 g/dL — ABNORMAL LOW (ref 12.0–15.0)
Lymphocytes Relative: 11.2 % — ABNORMAL LOW (ref 12.0–46.0)
Lymphs Abs: 3.8 10*3/uL (ref 0.7–4.0)
MCHC: 32.3 g/dL (ref 30.0–36.0)
MCV: 81.2 fl (ref 78.0–100.0)
Monocytes Absolute: 0.4 10*3/uL (ref 0.1–1.0)
Monocytes Relative: 1.2 % — ABNORMAL LOW (ref 3.0–12.0)
Neutro Abs: 28 10*3/uL — ABNORMAL HIGH (ref 1.4–7.7)
Neutrophils Relative %: 82.2 % — ABNORMAL HIGH (ref 43.0–77.0)
Platelets: 313 10*3/uL (ref 150.0–400.0)
RBC: 4.02 Mil/uL (ref 3.87–5.11)
RDW: 19.6 % — ABNORMAL HIGH (ref 11.5–15.5)
WBC: 34.1 10*3/uL (ref 4.0–10.5)

## 2018-08-31 LAB — HEMOGLOBIN A1C: Hgb A1c MFr Bld: 5.7 % (ref 4.6–6.5)

## 2018-08-31 NOTE — Progress Notes (Signed)
PCP notes:   Health maintenance:  Foot exam - PCP follow-up needed Tetanus vaccine - postponed/insurance A1C - completed  Abnormal screenings:   Fall risk  - hx of single fall Fall Risk  08/31/2018 07/30/2016 07/30/2015 07/26/2014 07/23/2013  Falls in the past year? 1 No Yes Yes No  Comment single fall due loss of balance - - - -  Number falls in past yr: 0 - 1 2 or more -  Injury with Fall? 0 - No No -  Risk for fall due to : Impaired balance/gait;Impaired mobility;History of fall(s) - - - -   Mini-Cog score: 14/17 MMSE - Mini Mental State Exam 08/31/2018 07/30/2016 04/27/2016 10/22/2015 09/23/2015  Not completed: - Unable to complete - - -  Orientation to time 5 - 5 3 1   Orientation to Place 5 - 5 5 4   Registration 3 - 3 3 3   Attention/ Calculation 0 - 5 5 2   Recall 0 - 1 0 0  Recall-comments unable to recall 3 of 3 words - - - -  Language- name 2 objects 0 - 2 2 2   Language- repeat 1 - 1 1 1   Language- follow 3 step command 0 - 3 3 3   Language- read & follow direction 0 - 1 1 1   Write a sentence 0 - 1 1 1   Copy design 0 - 1 1 1   Total score 14 - 28 25 19      Patient concerns:   A. Sleep management - unable to fall asleep until 2 or 3 AM. Caregiver is having hard time getting patient out of bed at 10AM. Napping a lot during daytime hours per caregiver report.   B. Niece has requested patient have a UA at next appt to ensure she does not have a UTI.  Nurse concerns:  None  Next PCP appt:   09/05/18 @ 1430

## 2018-08-31 NOTE — Patient Instructions (Signed)
Ms. Mis , Thank you for taking time to come for your Medicare Wellness Visit. I appreciate your ongoing commitment to your health goals. Please review the following plan we discussed and let me know if I can assist you in the future.   These are the goals we discussed: Goals    . Patient Stated     Starting 08/31/2018, I will use my walker when ambulation to reduce risk of falls.        This is a list of the screening recommended for you and due dates:  Health Maintenance  Topic Date Due  . Complete foot exam   04/12/2019*  . Tetanus Vaccine  04/11/2020*  . Mammogram  05/14/2020*  . Flu Shot  11/11/2018  . Eye exam for diabetics  01/13/2019  . Hemoglobin A1C  03/03/2019  . DEXA scan (bone density measurement)  Completed  . Pneumonia vaccines  Completed  *Topic was postponed. The date shown is not the original due date.   Preventive Care for Adults  A healthy lifestyle and preventive care can promote health and wellness. Preventive health guidelines for adults include the following key practices.  . A routine yearly physical is a good way to check with your health care provider about your health and preventive screening. It is a chance to share any concerns and updates on your health and to receive a thorough exam.  . Visit your dentist for a routine exam and preventive care every 6 months. Brush your teeth twice a day and floss once a day. Good oral hygiene prevents tooth decay and gum disease.  . The frequency of eye exams is based on your age, health, family medical history, use  of contact lenses, and other factors. Follow your health care provider's recommendations for frequency of eye exams.  . Eat a healthy diet. Foods like vegetables, fruits, whole grains, low-fat dairy products, and lean protein foods contain the nutrients you need without too many calories. Decrease your intake of foods high in solid fats, added sugars, and salt. Eat the right amount of calories for you.  Get information about a proper diet from your health care provider, if necessary.  . Regular physical exercise is one of the most important things you can do for your health. Most adults should get at least 150 minutes of moderate-intensity exercise (any activity that increases your heart rate and causes you to sweat) each week. In addition, most adults need muscle-strengthening exercises on 2 or more days a week.  Silver Sneakers may be a benefit available to you. To determine eligibility, you may visit the website: www.silversneakers.com or contact program at 918-270-0459 Mon-Fri between 8AM-8PM.   . Maintain a healthy weight. The body mass index (BMI) is a screening tool to identify possible weight problems. It provides an estimate of body fat based on height and weight. Your health care provider can find your BMI and can help you achieve or maintain a healthy weight.   For adults 20 years and older: ? A BMI below 18.5 is considered underweight. ? A BMI of 18.5 to 24.9 is normal. ? A BMI of 25 to 29.9 is considered overweight. ? A BMI of 30 and above is considered obese.   . Maintain normal blood lipids and cholesterol levels by exercising and minimizing your intake of saturated fat. Eat a balanced diet with plenty of fruit and vegetables. Blood tests for lipids and cholesterol should begin at age 52 and be repeated every 5 years. If  your lipid or cholesterol levels are high, you are over 50, or you are at high risk for heart disease, you may need your cholesterol levels checked more frequently. Ongoing high lipid and cholesterol levels should be treated with medicines if diet and exercise are not working.  . If you smoke, find out from your health care provider how to quit. If you do not use tobacco, please do not start.  . If you choose to drink alcohol, please do not consume more than 2 drinks per day. One drink is considered to be 12 ounces (355 mL) of beer, 5 ounces (148 mL) of wine, or  1.5 ounces (44 mL) of liquor.  . If you are 67-28 years old, ask your health care provider if you should take aspirin to prevent strokes.  . Use sunscreen. Apply sunscreen liberally and repeatedly throughout the day. You should seek shade when your shadow is shorter than you. Protect yourself by wearing long sleeves, pants, a wide-brimmed hat, and sunglasses year round, whenever you are outdoors.  . Once a month, do a whole body skin exam, using a mirror to look at the skin on your back. Tell your health care provider of new moles, moles that have irregular borders, moles that are larger than a pencil eraser, or moles that have changed in shape or color.

## 2018-08-31 NOTE — Telephone Encounter (Signed)
Aware See result note

## 2018-08-31 NOTE — Progress Notes (Signed)
Subjective:   Erin [name] is a 85 y.o. female who presents for Medicare Annual (Subsequent) preventive examination.  Review of Systems:  N/A Cardiac Risk Factors include: advanced age (>36men, >86 women);hypertension;dyslipidemia     Objective:     Vitals: There were no vitals taken for this visit.  There is no height or weight on file to calculate BMI.  Advanced Directives 08/31/2018 07/30/2016 09/26/2015 09/18/2015 01/03/2014 04/19/2012 04/16/2012  Does Patient Have a Medical Advance Directive? Yes Yes Yes Yes No;Yes Patient has advance directive, copy not in chart Patient has advance directive, copy not in chart  Type of Advance Directive [place];Living will [place];Living will Out of facility DNR (pink MOST or yellow form) Out of facility DNR (pink MOST or yellow form) Living will - Healthcare Power of [place];Living will  Does patient want to make changes to medical advance directive? No - Patient declined - - No - Patient declined - - -  Copy of [place] in Chart? Yes - validated most recent copy scanned in chart (See row information) No - copy requested Yes Yes No - copy requested - Copy requested from family  Would patient like information on creating a medical advance directive? - - - - No - patient declined information - -  Pre-existing out of facility DNR order (yellow form or pink MOST form) - - Pink MOST form placed in chart (order not valid for inpatient use);Yellow form placed in chart (order not valid for inpatient use) - - No No    Tobacco Social History   Tobacco Use  Smoking Status Former Smoker  . Types: Cigarettes  . Last attempt to quit: 08/14/1962  . Years since quitting: 56.0  Smokeless Tobacco Never Used  Tobacco Comment   Quit over 10 years ago     Counseling given: No Comment: Quit over 10 years ago   Clinical Intake:  Pre-visit preparation completed: Yes  Pain : No/denies pain Pain Score:  0-No pain     Nutritional Status: BMI 25 -29 Overweight Nutritional Risks: None Diabetes: Yes CBG done?: No Did pt. bring in CBG monitor from home?: No  How often do you need to have someone help you when you read instructions, pamphlets, or other written materials from your doctor or pharmacy?: 3 - Sometimes What is the last grade level you completed in school?: Bachelor degree  Interpreter Needed?: No  Comments: pt and son live together with assistance of caregiver and support from neice Information entered by :: LPinson, LPN  Past Medical History:  Diagnosis Date  . Diabetes mellitus   . Encephalopathy   . Hyperlipidemia   . Hypertension   . Memory loss   . Obesity   . Osteoarthritis   . Osteoporosis   . Sacroiliac joint dysfunction 04/01/2012  . 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 APPENDECTOMY    . cervicitis  1964   conization   . CHOLECYSTECTOMY    . COLON RESECTION  1988   secondary to cancer  . endometrial polyp  05/2000   hyperplasia-laser treatment  . JOINT REPLACEMENT     Rt knee replacement  . POLYPECTOMY  1998   benign X 2  . TOTAL KNEE ARTHROPLASTY  07/2009   right    Family History  Problem Relation Age of Onset  . Kidney failure Mother   . Stroke Father   .  Cancer Brother        lung   Social History   Socioeconomic History  . Marital status: Widowed    Spouse name: Not on file  . Number of children: 1  . Years of education: 2 yrs coll  . Highest education level: Not on file  Occupational History  . Occupation: Retired  Scientific laboratory technician  . Financial resource strain: Not on file  . Food insecurity:    Worry: Not on file    Inability: Not on file  . Transportation needs:    Medical: Not on file    Non-medical: Not on file  Tobacco Use  . Smoking status: Former Smoker    Types: Cigarettes    Last attempt to quit: 08/14/1962    Years since quitting: 56.0  . Smokeless  tobacco: Never Used  . Tobacco comment: Quit over 10 years ago  Substance and Sexual Activity  . Alcohol use: No    Alcohol/week: 0.0 standard drinks  . Drug use: No  . Sexual activity: Not on file  Lifestyle  . Physical activity:    Days per week: Not on file    Minutes per session: Not on file  . Stress: Not on file  Relationships  . Social connections:    Talks on phone: Not on file    Gets together: Not on file    Attends religious service: Not on file    Active member of club or organization: Not on file    Attends meetings of clubs or organizations: Not on file    Relationship status: Not on file  Other Topics Concern  . Not on file  Social History Narrative   Lives with son who is mentally impaired.     She is temporarily in [facility] for rehab.   Right-handed.   No caffeine use.    Outpatient Encounter Medications as of 08/31/2018  Medication Sig  . acetaminophen (TYLENOL) 325 MG tablet Take 650 mg by mouth every 6 (six) hours as needed for mild pain.  Marland Kitchen amLODipine (NORVASC) 10 MG tablet TAKE 1 TABLET BY MOUTH EVERY DAY  . aspirin 325 MG tablet Take 325 mg by mouth daily.   . calcium carbonate (OS-CAL) 600 MG TABS tablet Take 600 mg by mouth daily with breakfast.   . escitalopram (LEXAPRO) 10 MG tablet TAKE ONE TABLET BY MOUTH ONCE A DAY  . famotidine (PEPCID) 20 MG tablet Take 1 tablet (20 mg total) by mouth 2 (two) times daily.  Marland Kitchen lisinopril (PRINIVIL,ZESTRIL) 5 MG tablet Take 1 tablet (5 mg total) by mouth daily.  Marland Kitchen NUTRITIONAL SUPPLEMENT LIQD Take 120 mLs by mouth daily.   Marland Kitchen senna-docusate (SENNA-S) 8.6-50 MG tablet TAKE TWO (2) TABLETS BY MOUTH 2 TIMES DAILY FOR CONSTIPATION  . vitamin C (ASCORBIC ACID) 500 MG tablet Take 500 mg by mouth daily.   . [DISCONTINUED] cephALEXin (KEFLEX) 250 MG capsule Take 1 capsule (250 mg total) by mouth 2 (two) times daily.  . [DISCONTINUED] Nutritional Supplements (NUTRITIONAL SUPPLEMENT PO) Take 1 each  by mouth. Magic Cup on trays   No facility-administered encounter medications on file as of 08/31/2018.     Activities of Daily Living In your present state of health, do you have any difficulty performing the following activities: 08/31/2018  Hearing? Y  Vision? N  Difficulty concentrating or making decisions? Y  Walking or climbing stairs? Y  Dressing or bathing? Y  Doing errands, shopping? Y  Preparing Food and eating ?  Y  Using the Toilet? Y  In the past six months, have you accidently leaked urine? N  Do you have problems with loss of bowel control? N  Managing your Medications? Y  Managing your Finances? Y  Housekeeping or managing your Housekeeping? Y  Some recent data might be hidden    Patient Care Team: Tower, Wynelle Fanny, MD as PCP - General (Family Medicine)    Assessment:   This is a routine wellness examination for [place].  Exercise Activities and Dietary recommendations Current Exercise Habits: The patient does not participate in regular exercise at present, Exercise limited by: None identified  Goals    . Patient Stated     Starting 08/31/2018, I will use my walker when ambulation to reduce risk of falls.        Fall Risk Fall Risk  08/31/2018 07/30/2016 07/30/2015 07/26/2014 07/23/2013  Falls in the past year? 1 No Yes Yes No  Comment single fall due loss of balance - - - -  Number falls in past yr: 0 - 1 2 or more -  Injury with Fall? 0 - No No -  Risk for fall due to : Impaired balance/gait;Impaired mobility;History of fall(s) - - - -   Depression Screen PHQ 2/9 Scores 08/31/2018 07/30/2016 07/30/2015 07/26/2014  PHQ - 2 Score 0 0 0 0  PHQ- 9 Score 0 - - -     Cognitive Function MMSE - Mini Mental State Exam 08/31/2018 07/30/2016 04/27/2016 10/22/2015 09/23/2015  Not completed: - Unable to complete - - -  Orientation to time 5 - 5 3 1   Orientation to Place 5 - 5 5 4   Registration 3 - 3 3 3   Attention/ Calculation 0 - 5 5 2   Recall 0 - 1 0 0  Recall-comments  unable to recall 3 of 3 words - - - -  Language- name 2 objects 0 - 2 2 2   Language- repeat 1 - 1 1 1   Language- follow 3 step command 0 - 3 3 3   Language- read & follow direction 0 - 1 1 1   Write a sentence 0 - 1 1 1   Copy design 0 - 1 1 1   Total score 14 - 28 25 19      PLEASE NOTE: A Mini-Cog screen was completed. Maximum score is 17. A value of 0 denotes this part of [place] MMSE was not completed or the patient failed this part of the Mini-Cog screening.   Mini-Cog Screening Orientation to Time - Max 5 pts Orientation to Place - Max 5 pts Registration - Max 3 pts Recall - Max 3 pts Language Repeat - Max 1 pts      Immunization History  Administered Date(s) Administered  . Influenza Split 04/02/2011, 01/07/2012  . Influenza Whole 05/20/2009, 03/23/2010  . Influenza,inj,Quad PF,6+ Mos 12/08/2012, 01/23/2014, 01/27/2015, 01/02/2016, 02/04/2017, 12/21/2017  . PPD Test 08/29/2015  . Pneumococcal Conjugate-13 07/26/2014  . Pneumococcal Polysaccharide-23 12/11/1996, 03/28/2007  . Td 12/11/1996, 03/28/2007    Screening Tests Health Maintenance  Topic Date Due  . FOOT EXAM  04/12/2019 (Originally 01/01/2017)  . TETANUS/TDAP  04/11/2020 (Originally 03/27/2017)  . MAMMOGRAM  05/14/2020 (Originally 11/10/2013)  . INFLUENZA VACCINE  11/11/2018  . OPHTHALMOLOGY EXAM  01/13/2019  . HEMOGLOBIN A1C  03/03/2019  . DEXA SCAN  Completed  . PNA vac Low Risk Adult  Completed      Plan:     I have personally reviewed, addressed, and noted the following in the patient's  chart:  A. Medical and social history B. Use of alcohol, tobacco or illicit drugs  C. Current medications and supplements D. Functional ability and status E.  Nutritional status F.  Physical activity G. Advance directives H. List of other physicians I.  Hospitalizations, surgeries, and ER visits in previous 12 months J.  Vitals (unless it is a telemedicine encounter) K. Screenings to include cognitive, depression,  hearing, vision (NOTE: hearing and vision screenings not completed in telemedicine encounter) L. Referrals and appointments   In addition, I have reviewed and discussed with patient certain preventive protocols, quality metrics, and best practice recommendations. A written personalized care plan for preventive services and recommendations were provided to patient.  With patient's permission, we connected on 08/31/18 at 12:00 PM EDT by a video enabled telemedicine application. Two patient identifiers were used to ensure the encounter occurred with the correct person.    Patient was in home and writer was in office.   Signed,   Lindell Noe, MHA, BS, LPN Health Coach

## 2018-08-31 NOTE — Telephone Encounter (Signed)
[facility] called a critical result at 1255 WBC 34.1

## 2018-08-31 NOTE — Telephone Encounter (Signed)
-----   Message from Ellamae Sia sent at 08/31/2018  7:54 AM EDT ----- Regarding: lab orders  AWV lab orders, please.

## 2018-09-01 LAB — PATHOLOGIST SMEAR REVIEW

## 2018-09-01 NOTE — Progress Notes (Signed)
I reviewed health advisor's note, was available for consultation, and agree with documentation and plan.   Signed,  Lanisa Ishler T. Aaliyan Brinkmeier, MD  

## 2018-09-04 ENCOUNTER — Telehealth: Payer: Self-pay | Admitting: Family Medicine

## 2018-09-04 DIAGNOSIS — D72829 Elevated white blood cell count, unspecified: Secondary | ICD-10-CM | POA: Insufficient documentation

## 2018-09-04 NOTE — Telephone Encounter (Signed)
-----   Message from Tammi Sou, [place] sent at 09/01/2018  2:56 PM EDT ----- [name] notified of Dr. Marliss Coots comments, they will hold off on urine sample until appt next week, but she does agree with referral to hematologist, also please see my comments on last lab results regarding how pt is feeling. I advised [name] our [facility] will call to schedule appt

## 2018-09-04 NOTE — Telephone Encounter (Signed)
Referral done Will route to [facility]  

## 2018-09-05 ENCOUNTER — Other Ambulatory Visit: Payer: Self-pay

## 2018-09-05 ENCOUNTER — Ambulatory Visit (INDEPENDENT_AMBULATORY_CARE_PROVIDER_SITE_OTHER): Payer: Medicare Other | Admitting: Family Medicine

## 2018-09-05 ENCOUNTER — Encounter: Payer: Self-pay | Admitting: Family Medicine

## 2018-09-05 VITALS — BP 114/54 | HR 66 | Temp 98.9°F | Wt 184.0 lb

## 2018-09-05 DIAGNOSIS — N179 Acute kidney failure, unspecified: Secondary | ICD-10-CM | POA: Diagnosis not present

## 2018-09-05 DIAGNOSIS — R419 Unspecified symptoms and signs involving cognitive functions and awareness: Secondary | ICD-10-CM

## 2018-09-05 DIAGNOSIS — R7303 Prediabetes: Secondary | ICD-10-CM

## 2018-09-05 DIAGNOSIS — H34231 Retinal artery branch occlusion, right eye: Secondary | ICD-10-CM | POA: Diagnosis not present

## 2018-09-05 DIAGNOSIS — D649 Anemia, unspecified: Secondary | ICD-10-CM

## 2018-09-05 DIAGNOSIS — N182 Chronic kidney disease, stage 2 (mild): Secondary | ICD-10-CM

## 2018-09-05 DIAGNOSIS — M85859 Other specified disorders of bone density and structure, unspecified thigh: Secondary | ICD-10-CM | POA: Diagnosis not present

## 2018-09-05 DIAGNOSIS — E785 Hyperlipidemia, unspecified: Secondary | ICD-10-CM

## 2018-09-05 DIAGNOSIS — R8290 Unspecified abnormal findings in urine: Secondary | ICD-10-CM

## 2018-09-05 DIAGNOSIS — I10 Essential (primary) hypertension: Secondary | ICD-10-CM

## 2018-09-05 DIAGNOSIS — C189 Malignant neoplasm of colon, unspecified: Secondary | ICD-10-CM

## 2018-09-05 DIAGNOSIS — D72829 Elevated white blood cell count, unspecified: Secondary | ICD-10-CM

## 2018-09-05 LAB — POC URINALSYSI DIPSTICK (AUTOMATED)
Bilirubin, UA: NEGATIVE
Blood, UA: NEGATIVE
Glucose, UA: NEGATIVE
Ketones, UA: NEGATIVE
Nitrite, UA: NEGATIVE
Protein, UA: NEGATIVE
Spec Grav, UA: 1.015 (ref 1.010–1.025)
Urobilinogen, UA: 0.2 E.U./dL
pH, UA: 5.5 (ref 5.0–8.0)

## 2018-09-05 MED ORDER — LISINOPRIL 5 MG PO TABS: 5.0000 mg | ORAL_TABLET | Freq: Every day | ORAL | 3 refills | Status: AC

## 2018-09-05 MED ORDER — FAMOTIDINE 20 MG PO TABS: 20.0000 mg | ORAL_TABLET | Freq: Two times a day (BID) | ORAL | 3 refills | Status: AC

## 2018-09-05 MED ORDER — ESCITALOPRAM OXALATE 10 MG PO TABS: 10.0000 mg | ORAL_TABLET | Freq: Every day | ORAL | 3 refills | Status: AC

## 2018-09-05 MED ORDER — AMLODIPINE BESYLATE 10 MG PO TABS: 10.0000 mg | ORAL_TABLET | Freq: Every day | ORAL | 3 refills | Status: AC

## 2018-09-05 NOTE — Assessment & Plan Note (Signed)
Lab Results  Component Value Date   HGBA1C 5.7 08/31/2018   Controlled with diet  disc imp of low glycemic diet and wt loss to prevent DM2

## 2018-09-05 NOTE — Assessment & Plan Note (Signed)
Pt missed 3 word recall on mini cog  Mentally sharp today- her caregiver reports slow gradual short term memory change at home  No safety concerns- has aides and family to help

## 2018-09-05 NOTE — Assessment & Plan Note (Signed)
Disc goals for lipids and reasons to control them Rev last labs with pt Rev low sat fat diet in detail LDL is up significantly to 124 One of her aides is making more fatty foods  Disc plan to reduce this  May consider statin in light of DM - after getting eval for her leukocytosis

## 2018-09-05 NOTE — Assessment & Plan Note (Signed)
No re occurrence or symptoms at this time  Not pursuing colonoscopy due to age

## 2018-09-05 NOTE — Assessment & Plan Note (Signed)
Cr is up again  Lab Results  Component Value Date   CREATININE 1.81 (H) 08/31/2018   concerning  Still anemic (now with high wbc as well)  Making effort to drink water and avoid nsaids UA today with trace leuks- pending cx

## 2018-09-05 NOTE — Telephone Encounter (Signed)
Called [name], Referral sent to [facility], aware that they will call her to schedule.

## 2018-09-05 NOTE — Assessment & Plan Note (Signed)
Pt declines dexa No falls or fractures this year  Encouraged walking with walker  Taking ca and D

## 2018-09-05 NOTE — Assessment & Plan Note (Signed)
This is fairly stable  Unsure if related to CKD- but now also has leukocytosis For hematology eval tomorrow

## 2018-09-05 NOTE — Progress Notes (Signed)
Subjective:    Patient ID: Erin [name], female    DOB: 09-29-1928, 85 y.o.   MRN: [id number]  HPI Pt is here for annual f/u of chronic health problems   She had amw on 5/21 No falls  Mini cog score of 14 out of 17 (missed 3 word recall)  She has hx of cognitive decline  Per pt about the same at home -not any worse   It is noted that she has been napping a lot/sleeping all the time and hard to get out of bed  Feeling more sleepy /and does not want to get up  Not very active at all / too tired to do so   Mammogram postponed- declines/ has post herpetic neuralgia in this area  Self breast exam  - no lumps   Eye exam utd 10/19 Has retinal artery occlusion  No symptoms Roney Jaffe vision is ok  Unsure when she goes back   dexa 4/15 Declines another one at this time  No falls or fx recently  Taking ca and D   Weight: Wt Readings from Last 3 Encounters:  09/05/18 184 lb (83.5 kg)  12/21/17 184 lb (83.5 kg)  08/09/17 192 lb 12.8 oz (87.5 kg)   26.40 kg/m    Hypertension: bp is stable today  No cp or palpitations or headaches or edema  No side effects to medicines  BP Readings from Last 3 Encounters:  09/05/18 (!) 114/54  12/29/17 118/68  12/21/17 114/68    Not dizzy (down a bit)  Taking amlodipine 10 Lisinopril 5 mg  H/o retinal artery occlusion   Personal h/o colon cancer  Stable anemia and now notable leukocytosis  Lab Results  Component Value Date   WBC 34.1 Repeated and verified X2. (HH) 08/31/2018   HGB 10.6 (L) 08/31/2018   HCT 32.6 (L) 08/31/2018   MCV 81.2 08/31/2018   PLT 313.0 08/31/2018   has appt with hematology tomorrow  She has not had steroids lately    CKD Lab Results  Component Value Date   CREATININE 1.81 (H) 08/31/2018   BUN 25 (H) 08/31/2018   NA 139 08/31/2018   K 4.6 08/31/2018   CL 104 08/31/2018   CO2 27 08/31/2018  cr is up significantly  No urinary symptoms at all  Is keeping up a good water intake  Struggles with it some  days     Urinalysis Results for orders placed or performed in visit on 09/05/18  POCT Urinalysis Dipstick (Automated)  Result Value Ref Range   Color, UA Yellow    Clarity, UA Hazy    Glucose, UA Negative Negative   Bilirubin, UA Negative    Ketones, UA Negative    Spec Grav, UA 1.015 1.010 - 1.025   Blood, UA Negative    pH, UA 5.5 5.0 - 8.0   Protein, UA Negative Negative   Urobilinogen, UA 0.2 0.2 or 1.0 E.U./dL   Nitrite, UA Negative    Leukocytes, UA Small (1+) (A) Negative     Prediabetes Lab Results  Component Value Date   HGBA1C 5.7 08/31/2018  up from 5.4  Overall well controlled  Appetite is about the same  Ensure for breakfast  Snack for lunch  Usually has a dinner   Lab Results  Component Value Date   TSH 2.68 08/31/2018    Lab Results  Component Value Date   ALT 8 08/31/2018   AST 22 08/31/2018   ALKPHOS 54 08/31/2018   BILITOT  0.5 08/31/2018    Hyperlipidemia Lab Results  Component Value Date   CHOL 201 (H) 08/31/2018   CHOL 195 12/21/2017   CHOL 167 01/28/2017   Lab Results  Component Value Date   HDL 45.30 08/31/2018   HDL 40.00 12/21/2017   HDL 41.60 01/28/2017   Lab Results  Component Value Date   LDLCALC 124 (H) 08/31/2018   LDLCALC 89 01/28/2017   LDLCALC 69 08/26/2015   Lab Results  Component Value Date   TRIG 159.0 (H) 08/31/2018   TRIG 254.0 (H) 12/21/2017   TRIG 182.0 (H) 01/28/2017   Lab Results  Component Value Date   CHOLHDL 4 08/31/2018   CHOLHDL 5 12/21/2017   CHOLHDL 4 01/28/2017   Lab Results  Component Value Date   LDLDIRECT 75.0 12/21/2017   LDLDIRECT 58.0 07/30/2016   LDLDIRECT 39.0 07/23/2015   some change in diet  The week before the blood work - her staff changed  Her aide cooked -did more bacon /sausage and fatty foods   Patient Active Problem List   Diagnosis Date Noted  . Leukocytosis 09/04/2018  . Bradycardia 12/29/2017  . Neurocognitive disorder 08/04/2017  . Retinal artery occlusion,  branch, right 03/11/2017  . Eczema 01/02/2016  . Brain aneurysm 10/22/2015  . Abnormality of gait 09/23/2015  . H/O: CVA (cerebrovascular accident)   . Goals of care, counseling/discussion   . Slow transit constipation   . Risk for falls 07/05/2012  . Acute renal failure superimposed on stage 2 chronic kidney disease ([place]) 04/16/2012  . Colon cancer H/O 04/07/2012  . Sacroiliac joint dysfunction 04/01/2012  . Sciatica 03/31/2012  . Low back pain 04/02/2011  . Other screening mammogram 09/30/2010  . Prediabetes 09/30/2010  . Post-menopausal 09/30/2010  . ABDOMINAL WALL HERNIA 02/26/2010  . Mild anemia 08/18/2009  . Essential hypertension 10/31/2007  . Hyperlipidemia LDL goal <100 07/26/2006  . HEMORRHOIDS 07/26/2006  . OSTEOARTHRITIS 07/26/2006  . Osteopenia 07/26/2006  . Disturbance in sleep behavior 07/26/2006   Past Medical History:  Diagnosis Date  . Diabetes mellitus   . Encephalopathy   . Hyperlipidemia   . Hypertension   . Memory loss   . Obesity   . Osteoarthritis   . Osteoporosis   . Sacroiliac joint dysfunction 04/01/2012  . 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 APPENDECTOMY    . cervicitis  1964   conization   . CHOLECYSTECTOMY    . COLON RESECTION  1988   secondary to cancer  . endometrial polyp  05/2000   hyperplasia-laser treatment  . JOINT REPLACEMENT     Rt knee replacement  . POLYPECTOMY  1998   benign X 2  . TOTAL KNEE ARTHROPLASTY  07/2009   right    Social History   Tobacco Use  . Smoking status: Former Smoker    Types: Cigarettes    Last attempt to quit: 08/14/1962    Years since quitting: 56.0  . Smokeless tobacco: Never Used  . Tobacco comment: Quit over 10 years ago  Substance Use Topics  . Alcohol use: No    Alcohol/week: 0.0 standard drinks  . Drug use: No   Family History  Problem Relation Age of Onset  . Kidney failure Mother   . Stroke Father   . Cancer  Brother        lung   Allergies  Allergen Reactions  . Fluoxetine Hcl Nausea And Vomiting  .  Hydrocodone Nausea And Vomiting  . Shellfish Allergy Nausea And Vomiting    Makes the patient feel "sick"  . Tape Other (See Comments)    Tears and bruises; please use Coban wrap  . Diclofenac Sodium Nausea And Vomiting  . Latex Rash   Current Outpatient Medications on File Prior to Visit  Medication Sig Dispense Refill  . acetaminophen (TYLENOL) 325 MG tablet Take 650 mg by mouth every 6 (six) hours as needed for mild pain.    Marland Kitchen aspirin 325 MG tablet Take 325 mg by mouth daily.     . calcium carbonate (OS-CAL) 600 MG TABS tablet Take 600 mg by mouth daily with breakfast.     . NUTRITIONAL SUPPLEMENT LIQD Take 120 mLs by mouth daily.     Marland Kitchen senna-docusate (SENNA-S) 8.6-50 MG tablet TAKE TWO (2) TABLETS BY MOUTH 2 TIMES DAILY FOR CONSTIPATION 120 tablet 0  . vitamin C (ASCORBIC ACID) 500 MG tablet Take 500 mg by mouth daily.      No current facility-administered medications on file prior to visit.      Review of Systems  Constitutional: Positive for fatigue. Negative for activity change, appetite change, fever and unexpected weight change.  HENT: Negative for congestion, ear pain, rhinorrhea, sinus pressure and sore throat.   Eyes: Negative for pain, redness and visual disturbance.  Respiratory: Negative for cough, shortness of breath and wheezing.   Cardiovascular: Negative for chest pain and palpitations.  Gastrointestinal: Negative for abdominal pain, blood in stool, constipation and diarrhea.  Endocrine: Negative for polydipsia and polyuria.  Genitourinary: Negative for dysuria, frequency, hematuria and urgency.  Musculoskeletal: Positive for arthralgias and back pain. Negative for myalgias.  Skin: Negative for pallor and rash.       Post herpetic neuralgia on chest persists   Allergic/Immunologic: Negative for environmental allergies.  Neurological: Negative for dizziness, syncope  and headaches.  Hematological: Negative for adenopathy. Does not bruise/bleed easily.  Psychiatric/Behavioral: Negative for decreased concentration, dysphoric mood and sleep disturbance. The patient is not nervous/anxious.        Objective:   Physical Exam Constitutional:      General: She is not in acute distress.    Appearance: Normal appearance. She is well-developed and normal weight. She is not ill-appearing.  HENT:     Head: Normocephalic and atraumatic.     Right Ear: Tympanic membrane, ear canal and external ear normal.     Left Ear: Tympanic membrane, ear canal and external ear normal.     Nose: Nose normal. No congestion.     Mouth/Throat:     Mouth: Mucous membranes are moist.     Pharynx: Oropharynx is clear. No posterior oropharyngeal erythema.  Eyes:     General: No scleral icterus.    Extraocular Movements: Extraocular movements intact.     Conjunctiva/sclera: Conjunctivae normal.     Pupils: Pupils are equal, round, and reactive to light.  Neck:     Musculoskeletal: Normal range of motion and neck supple. No muscular tenderness.     Thyroid: No thyromegaly.     Vascular: No carotid bruit or JVD.  Cardiovascular:     Rate and Rhythm: Normal rate and regular rhythm.     Heart sounds: Normal heart sounds. No gallop.   Pulmonary:     Effort: Pulmonary effort is normal. No respiratory distress.     Breath sounds: Normal breath sounds. No wheezing.  Chest:     Chest wall: No tenderness.  Abdominal:  General: Bowel sounds are normal. There is no distension or abdominal bruit.     Palpations: Abdomen is soft. There is no mass.     Tenderness: There is no abdominal tenderness.  Genitourinary:    Comments: Breast exam: No mass, nodules, thickening, tenderness, bulging, retraction, inflamation, nipple discharge or skin changes noted.  No axillary or clavicular LA.     (exam done sitting)  Musculoskeletal: Normal range of motion.        General: No tenderness.   Lymphadenopathy:     Cervical: No cervical adenopathy.  Skin:    General: Skin is warm and dry.     Coloration: Skin is not pale.     Findings: No erythema or rash.     Comments: Scattered SKs    Neurological:     General: No focal deficit present.     Mental Status: She is alert. Mental status is at baseline.     Cranial Nerves: No cranial nerve deficit.     Motor: No weakness or abnormal muscle tone.     Coordination: Coordination normal.     Deep Tendon Reflexes: Reflexes are normal and symmetric. Reflexes normal.     Comments: Gait is baseline unsteady  Psychiatric:        Mood and Affect: Mood normal.     Comments: Good mood Fairly mentally sharp today  Talkative            Assessment & Plan:   Problem List Items Addressed This Visit      Cardiovascular and Mediastinum   Essential hypertension - Primary    . bp in fair control at this time  BP Readings from Last 1 Encounters:  09/05/18 (!) 114/54   No changes needed Most recent labs reviewed  Disc lifstyle change with low sodium diet and exercise        Relevant Medications   amLODipine (NORVASC) 10 MG tablet   lisinopril (ZESTRIL) 5 MG tablet   Retinal artery occlusion, branch, right    Per pt eye exam went well in October and she has no vision change       Relevant Medications   amLODipine (NORVASC) 10 MG tablet   lisinopril (ZESTRIL) 5 MG tablet     Digestive   Colon cancer H/O (Chronic)    No re occurrence or symptoms at this time  Not pursuing colonoscopy due to age        Nervous and Auditory   Neurocognitive disorder    Pt missed 3 word recall on mini cog  Mentally sharp today- her caregiver reports slow gradual short term memory change at home  No safety concerns- has aides and family to help        Musculoskeletal and Integument   Osteopenia    Pt declines dexa No falls or fractures this year  Encouraged walking with walker  Taking ca and D        Genitourinary   Acute  renal failure superimposed on stage 2 chronic kidney disease ([place])    Cr is up again  Lab Results  Component Value Date   CREATININE 1.81 (H) 08/31/2018   concerning  Still anemic (now with high wbc as well)  Making effort to drink water and avoid nsaids UA today with trace leuks- pending cx       Relevant Orders   POCT Urinalysis Dipstick (Automated) (Completed)     Other   Hyperlipidemia LDL goal <100    Disc goals for lipids  and reasons to control them Rev last labs with pt Rev low sat fat diet in detail LDL is up significantly to 124 One of her aides is making more fatty foods  Disc plan to reduce this  May consider statin in light of DM - after getting eval for her leukocytosis       Relevant Medications   amLODipine (NORVASC) 10 MG tablet   lisinopril (ZESTRIL) 5 MG tablet   Mild anemia    This is fairly stable  Unsure if related to CKD- but now also has leukocytosis For hematology eval tomorrow      Prediabetes    Lab Results  Component Value Date   HGBA1C 5.7 08/31/2018   Controlled with diet  disc imp of low glycemic diet and wt loss to prevent DM2       Leukocytosis    New and significant with pos path review for abn cells Lab Results  Component Value Date   WBC 34.1 Repeated and verified X2. [facility]) 08/31/2018   Also continues to have anemia  Hematology ref done-pt has appt tomorrow She is feeling more tired/sleeping more lately      Relevant Orders   POCT Urinalysis Dipstick (Automated) (Completed)   Urine Culture    Other Visit Diagnoses    Abnormal urinalysis       Relevant Orders   Urine Culture

## 2018-09-05 NOTE — Patient Instructions (Addendum)
Keep drinking lots of water  Let's check a urinalysis on the way out   See hematology tomorrow as planned   I sent in your medications without change

## 2018-09-05 NOTE — Assessment & Plan Note (Signed)
.   bp in fair control at this time  BP Readings from Last 1 Encounters:  09/05/18 (!) 114/54   No changes needed Most recent labs reviewed  Disc lifstyle change with low sodium diet and exercise

## 2018-09-05 NOTE — Assessment & Plan Note (Signed)
Per pt eye exam went well in October and she has no vision change

## 2018-09-05 NOTE — Assessment & Plan Note (Signed)
New and significant with pos path review for abn cells Lab Results  Component Value Date   WBC 34.1 Repeated and verified X2. [facility]) 08/31/2018   Also continues to have anemia  Hematology ref done-pt has appt tomorrow She is feeling more tired/sleeping more lately

## 2018-09-06 ENCOUNTER — Other Ambulatory Visit: Payer: Self-pay

## 2018-09-06 ENCOUNTER — Encounter: Payer: Medicare Other | Admitting: Oncology

## 2018-09-06 ENCOUNTER — Inpatient Hospital Stay: Payer: Medicare Other | Attending: Oncology | Admitting: Oncology

## 2018-09-06 ENCOUNTER — Inpatient Hospital Stay: Payer: Medicare Other | Admitting: Oncology

## 2018-09-06 ENCOUNTER — Encounter: Payer: Self-pay | Admitting: Oncology

## 2018-09-06 ENCOUNTER — Other Ambulatory Visit: Payer: Self-pay | Admitting: *Deleted

## 2018-09-06 ENCOUNTER — Inpatient Hospital Stay: Payer: Medicare Other

## 2018-09-06 VITALS — BP 126/69 | HR 66 | Temp 98.5°F | Resp 18 | Wt 181.6 lb

## 2018-09-06 DIAGNOSIS — M1990 Unspecified osteoarthritis, unspecified site: Secondary | ICD-10-CM | POA: Diagnosis not present

## 2018-09-06 DIAGNOSIS — N183 Chronic kidney disease, stage 3 (moderate): Secondary | ICD-10-CM

## 2018-09-06 DIAGNOSIS — E785 Hyperlipidemia, unspecified: Secondary | ICD-10-CM | POA: Diagnosis not present

## 2018-09-06 DIAGNOSIS — Z79899 Other long term (current) drug therapy: Secondary | ICD-10-CM | POA: Diagnosis not present

## 2018-09-06 DIAGNOSIS — Z7982 Long term (current) use of aspirin: Secondary | ICD-10-CM | POA: Diagnosis not present

## 2018-09-06 DIAGNOSIS — D729 Disorder of white blood cells, unspecified: Secondary | ICD-10-CM

## 2018-09-06 DIAGNOSIS — R001 Bradycardia, unspecified: Secondary | ICD-10-CM | POA: Diagnosis not present

## 2018-09-06 DIAGNOSIS — I129 Hypertensive chronic kidney disease with stage 1 through stage 4 chronic kidney disease, or unspecified chronic kidney disease: Secondary | ICD-10-CM | POA: Diagnosis not present

## 2018-09-06 DIAGNOSIS — D72829 Elevated white blood cell count, unspecified: Secondary | ICD-10-CM

## 2018-09-06 DIAGNOSIS — D72824 Basophilia: Secondary | ICD-10-CM

## 2018-09-06 DIAGNOSIS — M8580 Other specified disorders of bone density and structure, unspecified site: Secondary | ICD-10-CM

## 2018-09-06 DIAGNOSIS — Z87891 Personal history of nicotine dependence: Secondary | ICD-10-CM | POA: Insufficient documentation

## 2018-09-06 DIAGNOSIS — R799 Abnormal finding of blood chemistry, unspecified: Secondary | ICD-10-CM

## 2018-09-06 DIAGNOSIS — Z8673 Personal history of transient ischemic attack (TIA), and cerebral infarction without residual deficits: Secondary | ICD-10-CM

## 2018-09-06 DIAGNOSIS — Z85038 Personal history of other malignant neoplasm of large intestine: Secondary | ICD-10-CM

## 2018-09-06 DIAGNOSIS — R5383 Other fatigue: Secondary | ICD-10-CM

## 2018-09-06 DIAGNOSIS — E1122 Type 2 diabetes mellitus with diabetic chronic kidney disease: Secondary | ICD-10-CM | POA: Diagnosis not present

## 2018-09-06 LAB — COMPREHENSIVE METABOLIC PANEL
ALT: 11 U/L (ref 0–44)
AST: 25 U/L (ref 15–41)
Albumin: 4.3 g/dL (ref 3.5–5.0)
Alkaline Phosphatase: 54 U/L (ref 38–126)
Anion gap: 10 (ref 5–15)
BUN: 32 mg/dL — ABNORMAL HIGH (ref 8–23)
CO2: 24 mmol/L (ref 22–32)
Calcium: 9.6 mg/dL (ref 8.9–10.3)
Chloride: 103 mmol/L (ref 98–111)
Creatinine, Ser: 1.99 mg/dL — ABNORMAL HIGH (ref 0.44–1.00)
GFR calc Af Amer: 25 mL/min — ABNORMAL LOW (ref >60)
GFR calc non Af Amer: 22 mL/min — ABNORMAL LOW (ref >60)
Glucose, Bld: 106 mg/dL — ABNORMAL HIGH (ref 70–99)
Potassium: 4.4 mmol/L (ref 3.5–5.1)
Sodium: 137 mmol/L (ref 135–145)
Total Bilirubin: 0.6 mg/dL (ref 0.3–1.2)
Total Protein: 8 g/dL (ref 6.5–8.1)

## 2018-09-06 LAB — CBC WITH DIFFERENTIAL/PLATELET
Abs Immature Granulocytes: 6.8 K/uL — ABNORMAL HIGH (ref 0.00–0.07)
Band Neutrophils: 23 %
Basophils Absolute: 1.7 K/uL — ABNORMAL HIGH (ref 0.0–0.1)
Basophils Relative: 5 %
Blasts: 8 %
Eosinophils Absolute: 1 K/uL — ABNORMAL HIGH (ref 0.0–0.5)
Eosinophils Relative: 3 %
HCT: 33.8 % — ABNORMAL LOW (ref 36.0–46.0)
Hemoglobin: 10.6 g/dL — ABNORMAL LOW (ref 12.0–15.0)
Lymphocytes Relative: 9 %
Lymphs Abs: 3.1 K/uL (ref 0.7–4.0)
MCH: 26.3 pg (ref 26.0–34.0)
MCHC: 31.4 g/dL (ref 30.0–36.0)
MCV: 83.9 fL (ref 80.0–100.0)
Metamyelocytes Relative: 11 %
Monocytes Absolute: 0.3 K/uL (ref 0.1–1.0)
Monocytes Relative: 1 %
Myelocytes: 7 %
Neutro Abs: 18.4 K/uL — ABNORMAL HIGH (ref 1.7–7.7)
Neutrophils Relative %: 31 %
Platelets: 338 K/uL (ref 150–400)
Promyelocytes Relative: 2 %
RBC: 4.03 MIL/uL (ref 3.87–5.11)
RDW: 19.9 % — ABNORMAL HIGH (ref 11.5–15.5)
Smear Review: NORMAL
WBC Morphology: INCREASED
WBC: 34 K/uL — ABNORMAL HIGH (ref 4.0–10.5)
nRBC: 1.3 % — ABNORMAL HIGH (ref 0.0–0.2)

## 2018-09-06 LAB — RETICULOCYTES
Immature Retic Fract: 28.5 % — ABNORMAL HIGH (ref 2.3–15.9)
RBC.: 4.03 MIL/uL (ref 3.87–5.11)
Retic Count, Absolute: 71.7 10*3/uL (ref 19.0–186.0)
Retic Ct Pct: 1.8 % (ref 0.4–3.1)

## 2018-09-06 LAB — IRON AND TIBC
Iron: 48 ug/dL (ref 28–170)
Saturation Ratios: 17 % (ref 10.4–31.8)
TIBC: 291 ug/dL (ref 250–450)
UIBC: 243 ug/dL

## 2018-09-06 LAB — LACTATE DEHYDROGENASE: LDH: 308 U/L — ABNORMAL HIGH (ref 98–192)

## 2018-09-06 LAB — VITAMIN B12: Vitamin B-12: 3436 pg/mL — ABNORMAL HIGH (ref 180–914)

## 2018-09-06 LAB — PATHOLOGIST SMEAR REVIEW

## 2018-09-06 LAB — FERRITIN: Ferritin: 212 ng/mL (ref 11–307)

## 2018-09-06 LAB — URIC ACID: Uric Acid, Serum: 8 mg/dL — ABNORMAL HIGH (ref 2.5–7.1)

## 2018-09-06 LAB — FOLATE: Folate: 20.3 ng/mL (ref >5.9)

## 2018-09-06 NOTE — Progress Notes (Signed)
Pt here for elevated wbc, she states she has not have any recent fevers, infections no lumps or knots that she has felt. Eats good.

## 2018-09-07 ENCOUNTER — Telehealth: Payer: Self-pay | Admitting: *Deleted

## 2018-09-07 ENCOUNTER — Other Ambulatory Visit: Payer: Self-pay | Admitting: Family Medicine

## 2018-09-07 LAB — URINE CULTURE
MICRO NUMBER:: 505406
SPECIMEN QUALITY:: ADEQUATE

## 2018-09-07 NOTE — Telephone Encounter (Signed)
Pos urine culture for uti I pended keflex to send to pref pharmacy-please send  Keep up a good fluid intake

## 2018-09-07 NOTE — Telephone Encounter (Signed)
Called and spoke to niece and gave dates and instructions about the BM bx. On 6/2. She is driving and heard the info and wants me to send her a text to her phone and I did. And she can call me back if she has questions.

## 2018-09-08 LAB — COMP PANEL: LEUKEMIA/LYMPHOMA: Immunophenotypic Profile: 7

## 2018-09-08 MED ORDER — CEPHALEXIN 500 MG PO CAPS: 500.0000 mg | ORAL_CAPSULE | Freq: Three times a day (TID) | ORAL | 0 refills | Status: DC

## 2018-09-08 NOTE — Telephone Encounter (Signed)
Guss Bunde notified of urine cx results and Dr. Marliss Coots comments and verbalized understanding. Rx sent to pharmacy

## 2018-09-11 ENCOUNTER — Encounter: Payer: Self-pay | Admitting: Oncology

## 2018-09-11 ENCOUNTER — Other Ambulatory Visit: Payer: Self-pay | Admitting: Radiology

## 2018-09-11 NOTE — Progress Notes (Signed)
Hematology/Oncology Consult note [facility] Telephone:([phone] [phone]  Patient Care Team: Glori Bickers, Wynelle Fanny, MD as PCP - General (Family Medicine)   Name of the patient: Erin [name]  [id number]  1929-01-23    Reason for referral-leukocytosis   Referring physician-Dr. Dorothea Ogle  Date of visit: 09/11/18   History of presenting illness- Patient is a 85 year old female with a past medical history significant for hypertension, retinal artery occlusion, colon cancer, osteopenia and stage II CKD among other medical problems. She has been referred to [place] for evaluation of leukocytosis.  CBC with differential on 08/31/2018 showed white count of 34.1, H&H of 10.6/32.6 and a platelet count of 313.  Differential predominantly showed neutrophilia and basophilia with relative lymphopenia and mono cytopenia.  Pathology smear review showed leukoerythroblastic blood cells with immature granulocytic cells, nucleated RBCs and abnormal RBC morphology.  Primary hematologic disorder not excluded.  Prior to this her CBC from September 2019 showed a normal white count of 6.3.  She has been referred to [place] for further management.  Patient is elderly and frail and lives alone but needs assistance with her IADLs.  Her niece is mainly involved in her care.  Most of her children live in [place].  Patient reports fatigue.  Her appetite and weight have remained stable.  Denies any pain at this time.  Denies any recurrent fevers, infections or hospitalizations  ECOG PS- 2 Pain scale- 0   Review of systems- Review of Systems  Constitutional: Positive for malaise/fatigue. Negative for chills, fever and weight loss.  HENT: Negative for congestion, ear discharge and nosebleeds.   Eyes: Negative for blurred vision.  Respiratory: Negative for cough, hemoptysis, sputum production, shortness of breath and wheezing.   Cardiovascular: Negative for chest pain, palpitations, orthopnea and  claudication.  Gastrointestinal: Negative for abdominal pain, blood in stool, constipation, diarrhea, heartburn, melena, nausea and vomiting.  Genitourinary: Negative for dysuria, flank pain, frequency, hematuria and urgency.  Musculoskeletal: Negative for back pain, joint pain and myalgias.  Skin: Negative for rash.  Neurological: Negative for dizziness, tingling, focal weakness, seizures, weakness and headaches.  Endo/Heme/Allergies: Does not bruise/bleed easily.  Psychiatric/Behavioral: Negative for depression and suicidal ideas. The patient does not have insomnia.     Allergies  Allergen Reactions  . Fluoxetine Hcl Nausea And Vomiting  . Hydrocodone Nausea And Vomiting  . Shellfish Allergy Nausea And Vomiting    Makes the patient feel "sick"  . Tape Other (See Comments)    Tears and bruises; please use Coban wrap  . Diclofenac Sodium Nausea And Vomiting  . Latex Rash    Patient Active Problem List   Diagnosis Date Noted  . Leukocytosis 09/04/2018  . Bradycardia 12/29/2017  . Neurocognitive disorder 08/04/2017  . Retinal artery occlusion, branch, right 03/11/2017  . Eczema 01/02/2016  . Brain aneurysm 10/22/2015  . Abnormality of gait 09/23/2015  . H/O: CVA (cerebrovascular accident)   . Goals of care, counseling/discussion   . Slow transit constipation   . Risk for falls 07/05/2012  . Acute renal failure superimposed on stage 2 chronic kidney disease ([place]) 04/16/2012  . Colon cancer H/O 04/07/2012  . Sacroiliac joint dysfunction 04/01/2012  . Sciatica 03/31/2012  . Low back pain 04/02/2011  . Other screening mammogram 09/30/2010  . Prediabetes 09/30/2010  . Post-menopausal 09/30/2010  . ABDOMINAL WALL HERNIA 02/26/2010  . Mild anemia 08/18/2009  . Essential hypertension 10/31/2007  . Hyperlipidemia LDL goal <100 07/26/2006  . HEMORRHOIDS 07/26/2006  . OSTEOARTHRITIS 07/26/2006  .  Osteopenia 07/26/2006  . Disturbance in sleep behavior 07/26/2006     Past  Medical History:  Diagnosis Date  . Diabetes mellitus   . Encephalopathy   . Hyperlipidemia   . Hypertension   . Memory loss   . Obesity   . Osteoarthritis   . Osteoporosis   . Sacroiliac joint dysfunction 04/01/2012  . 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 APPENDECTOMY    . cervicitis  1964   conization   . CHOLECYSTECTOMY    . COLON RESECTION  1988   secondary to cancer  . endometrial polyp  05/2000   hyperplasia-laser treatment  . JOINT REPLACEMENT     Rt knee replacement  . POLYPECTOMY  1998   benign X 2  . TOTAL KNEE ARTHROPLASTY  07/2009   right     Social History   Socioeconomic History  . Marital status: Widowed    Spouse name: Not on file  . Number of children: 1  . Years of education: 2 yrs coll  . Highest education level: Not on file  Occupational History  . Occupation: Retired  Scientific laboratory technician  . Financial resource strain: Not on file  . Food insecurity:    Worry: Not on file    Inability: Not on file  . Transportation needs:    Medical: Not on file    Non-medical: Not on file  Tobacco Use  . Smoking status: Former Smoker    Types: Cigarettes    Last attempt to quit: 08/14/1962    Years since quitting: 56.1  . Smokeless tobacco: Never Used  . Tobacco comment: Quit over 10 years ago  Substance and Sexual Activity  . Alcohol use: No    Alcohol/week: 0.0 standard drinks  . Drug use: No  . Sexual activity: Not Currently  Lifestyle  . Physical activity:    Days per week: Not on file    Minutes per session: Not on file  . Stress: Not on file  Relationships  . Social connections:    Talks on phone: Not on file    Gets together: Not on file    Attends religious service: Not on file    Active member of club or organization: Not on file    Attends meetings of clubs or organizations: Not on file    Relationship status: Not on file  . Intimate partner violence:    Fear of current  or ex partner: Not on file    Emotionally abused: Not on file    Physically abused: Not on file    Forced sexual activity: Not on file  Other Topics Concern  . Not on file  Social History Narrative   Lives with son who is mentally impaired.     She is temporarily in [facility] for rehab.   Right-handed.   No caffeine use.     Family History  Problem Relation Age of Onset  . Kidney failure Mother   . Stroke Father   . Cancer Brother        lung     Current Outpatient Medications:  .  acetaminophen (TYLENOL) 325 MG tablet, Take 650 mg by mouth every 6 (six) hours as needed for mild pain., Disp: , Rfl:  .  amLODipine (NORVASC) 10 MG tablet, Take 1 tablet (10 mg total) by mouth daily., Disp: 90 tablet, Rfl: 3 .  escitalopram (LEXAPRO) 10 MG  tablet, Take 1 tablet (10 mg total) by mouth daily., Disp: 90 tablet, Rfl: 3 .  famotidine (PEPCID) 20 MG tablet, Take 1 tablet (20 mg total) by mouth 2 (two) times daily., Disp: 180 tablet, Rfl: 3 .  lisinopril (ZESTRIL) 5 MG tablet, Take 1 tablet (5 mg total) by mouth daily., Disp: 90 tablet, Rfl: 3 .  NUTRITIONAL SUPPLEMENT LIQD, Take 120 mLs by mouth daily. , Disp: , Rfl:  .  aspirin 325 MG tablet, Take 325 mg by mouth daily. , Disp: , Rfl:  .  calcium carbonate (OS-CAL) 600 MG TABS tablet, Take 600 mg by mouth daily with breakfast. , Disp: , Rfl:  .  cephALEXin (KEFLEX) 500 MG capsule, Take 1 capsule (500 mg total) by mouth 3 (three) times daily., Disp: 21 capsule, Rfl: 0 .  senna-docusate (SENNA-S) 8.6-50 MG tablet, TAKE TWO (2) TABLETS BY MOUTH 2 TIMES DAILY FOR CONSTIPATION, Disp: 120 tablet, Rfl: 0 .  vitamin C (ASCORBIC ACID) 500 MG tablet, Take 500 mg by mouth daily. , Disp: , Rfl:    Physical exam:  Vitals:   09/06/18 1527  BP: 126/69  Pulse: 66  Resp: 18  Temp: 98.5 F (36.9 C)  TempSrc: Tympanic  Weight: 181 lb 9.6 oz (82.4 kg)   Physical Exam Constitutional:      Comments: Elderly frail woman  sitting in a wheelchair.  Appears in no acute distress  HENT:     Head: Normocephalic and atraumatic.  Eyes:     Pupils: Pupils are equal, round, and reactive to light.  Neck:     Musculoskeletal: Normal range of motion.  Cardiovascular:     Rate and Rhythm: Normal rate and regular rhythm.     Heart sounds: Normal heart sounds.  Pulmonary:     Effort: Pulmonary effort is normal.     Breath sounds: Normal breath sounds.  Abdominal:     General: Bowel sounds are normal.     Palpations: Abdomen is soft.     Comments: No palpable splenomegaly  Skin:    General: Skin is warm and dry.  Neurological:     Mental Status: She is alert and oriented to person, place, and time.        CMP Latest Ref Rng & Units 09/06/2018  Glucose 70 - 99 mg/dL 106(H)  BUN 8 - 23 mg/dL 32(H)  Creatinine 0.44 - 1.00 mg/dL 1.99(H)  Sodium 135 - 145 mmol/L 137  Potassium 3.5 - 5.1 mmol/L 4.4  Chloride 98 - 111 mmol/L 103  CO2 22 - 32 mmol/L 24  Calcium 8.9 - 10.3 mg/dL 9.6  Total Protein 6.5 - 8.1 g/dL 8.0  Total Bilirubin 0.3 - 1.2 mg/dL 0.6  Alkaline Phos 38 - 126 U/L 54  AST 15 - 41 U/L 25  ALT 0 - 44 U/L 11   CBC Latest Ref Rng & Units 09/06/2018  WBC 4.0 - 10.5 K/uL 34.0(H)  Hemoglobin 12.0 - 15.0 g/dL 10.6(L)  Hematocrit 36.0 - 46.0 % 33.8(L)  Platelets 150 - 400 K/uL 338    Assessment and plan- Patient is a 85 y.o. female referred for leukocytosis  Patient has significant leukocytosis with differential showing basophilia and neutrophilia.  Smear review that was done previously showed features of leukoerythroblastic red cells and abnormal RBC morphology concerning for a primary hematologic disorder.  I will obtain a CBC with differential and a repeat smear review at this time.  Also check CMP, LDH, uric acid and flow cytometry.  Given  concern for primary hematological disorder I will also proceed with a bone marrow biopsy at this time.  We could be dealing with a chronic myeloproliferative  process such as CML versus acute leukemia  I will tentatively see the patient back in 2 weeks time to discuss the results of her blood work and further management  I have updated her care plan to her niece as well.   Thank you for this kind referral and the opportunity to participate in the care of this  Patient   Visit Diagnosis 1. Leukocytosis, unspecified type   2. Abnormal blood smear   3. Neutrophilia   4. Basophilia     Dr. Randa Evens, MD, MPH [facility] at [facility] [phone] 09/11/2018

## 2018-09-12 ENCOUNTER — Other Ambulatory Visit (HOSPITAL_COMMUNITY)
Admission: RE | Admit: 2018-09-12 | Disposition: A | Discharge status: Home / Self Care | Payer: Medicare Other | Source: Ambulatory Visit | Attending: Oncology | Admitting: Oncology

## 2018-09-12 ENCOUNTER — Ambulatory Visit: Payer: Medicare Other

## 2018-09-12 ENCOUNTER — Ambulatory Visit
Admission: RE | Admit: 2018-09-12 | Discharge: 2018-09-12 | Disposition: A | Discharge status: Home / Self Care | Payer: Medicare Other | Source: Ambulatory Visit | Attending: Oncology | Admitting: Oncology

## 2018-09-12 ENCOUNTER — Other Ambulatory Visit: Payer: Self-pay

## 2018-09-12 DIAGNOSIS — D72829 Elevated white blood cell count, unspecified: Secondary | ICD-10-CM

## 2018-09-12 DIAGNOSIS — D649 Anemia, unspecified: Secondary | ICD-10-CM | POA: Diagnosis not present

## 2018-09-12 DIAGNOSIS — E785 Hyperlipidemia, unspecified: Secondary | ICD-10-CM | POA: Insufficient documentation

## 2018-09-12 DIAGNOSIS — M810 Age-related osteoporosis without current pathological fracture: Secondary | ICD-10-CM | POA: Insufficient documentation

## 2018-09-12 DIAGNOSIS — Z7901 Long term (current) use of anticoagulants: Secondary | ICD-10-CM | POA: Diagnosis not present

## 2018-09-12 DIAGNOSIS — Z87891 Personal history of nicotine dependence: Secondary | ICD-10-CM | POA: Diagnosis not present

## 2018-09-12 DIAGNOSIS — E118 Type 2 diabetes mellitus with unspecified complications: Secondary | ICD-10-CM | POA: Insufficient documentation

## 2018-09-12 DIAGNOSIS — N182 Chronic kidney disease, stage 2 (mild): Secondary | ICD-10-CM | POA: Diagnosis not present

## 2018-09-12 DIAGNOSIS — Z79899 Other long term (current) drug therapy: Secondary | ICD-10-CM | POA: Insufficient documentation

## 2018-09-12 DIAGNOSIS — D7589 Other specified diseases of blood and blood-forming organs: Secondary | ICD-10-CM | POA: Insufficient documentation

## 2018-09-12 DIAGNOSIS — I129 Hypertensive chronic kidney disease with stage 1 through stage 4 chronic kidney disease, or unspecified chronic kidney disease: Secondary | ICD-10-CM | POA: Insufficient documentation

## 2018-09-12 LAB — CBC WITH DIFFERENTIAL/PLATELET
Abs Immature Granulocytes: 7.97 10*3/uL — ABNORMAL HIGH (ref 0.00–0.07)
Basophils Absolute: 1.9 10*3/uL — ABNORMAL HIGH (ref 0.0–0.1)
Basophils Relative: 5 %
Eosinophils Absolute: 1 10*3/uL — ABNORMAL HIGH (ref 0.0–0.5)
Eosinophils Relative: 3 %
HCT: 33.2 % — ABNORMAL LOW (ref 36.0–46.0)
Hemoglobin: 10.4 g/dL — ABNORMAL LOW (ref 12.0–15.0)
Immature Granulocytes: 22 %
Lymphocytes Relative: 16 %
Lymphs Abs: 6 10*3/uL — ABNORMAL HIGH (ref 0.7–4.0)
MCH: 26.1 pg (ref 26.0–34.0)
MCHC: 31.3 g/dL (ref 30.0–36.0)
MCV: 83.2 fL (ref 80.0–100.0)
Monocytes Absolute: 0.6 10*3/uL (ref 0.1–1.0)
Monocytes Relative: 2 %
Neutro Abs: 19.7 10*3/uL — ABNORMAL HIGH (ref 1.7–7.7)
Neutrophils Relative %: 52 %
Platelets: 285 10*3/uL (ref 150–400)
RBC: 3.99 MIL/uL (ref 3.87–5.11)
RDW: 19.9 % — ABNORMAL HIGH (ref 11.5–15.5)
WBC: 37.1 10*3/uL — ABNORMAL HIGH (ref 4.0–10.5)
nRBC: 1.5 % — ABNORMAL HIGH (ref 0.0–0.2)

## 2018-09-12 LAB — PROTIME-INR
INR: 1 (ref 0.8–1.2)
Prothrombin Time: 13.5 seconds (ref 11.4–15.2)

## 2018-09-12 MED ORDER — MIDAZOLAM HCL 2 MG/2ML IJ SOLN
INTRAMUSCULAR | Status: AC
  Filled 2018-09-12: qty 4

## 2018-09-12 MED ORDER — HEPARIN SOD (PORK) LOCK FLUSH 100 UNIT/ML IV SOLN
INTRAVENOUS | Status: AC
  Filled 2018-09-12: qty 5

## 2018-09-12 MED ORDER — MIDAZOLAM HCL 2 MG/2ML IJ SOLN
INTRAMUSCULAR | Status: AC | PRN
  Administered 2018-09-12 (×2): 1 mg via INTRAVENOUS

## 2018-09-12 MED ORDER — FENTANYL CITRATE (PF) 100 MCG/2ML IJ SOLN
INTRAMUSCULAR | Status: AC
  Filled 2018-09-12: qty 4

## 2018-09-12 MED ORDER — FENTANYL CITRATE (PF) 100 MCG/2ML IJ SOLN
INTRAMUSCULAR | Status: AC | PRN
  Administered 2018-09-12 (×3): 25 ug via INTRAVENOUS

## 2018-09-12 MED ORDER — SODIUM CHLORIDE 0.9 % IV SOLN
INTRAVENOUS | Status: DC
  Administered 2018-09-12: 10:00:00 via INTRAVENOUS

## 2018-09-12 NOTE — Procedures (Signed)
Interventional Radiology Procedure Note  Procedure: CT guided aspirate and core biopsy of right iliac bone Complications: None Recommendations: - Bedrest supine x 1 hrs - Hydrocodone PRN  Pain - Follow biopsy results  Signed,  Heath K. McCullough, MD   

## 2018-09-12 NOTE — Consult Note (Signed)
Chief Complaint: Patient was seen in consultation today for leurkocytosis at the request of Rao,Archana C  Referring Physician(s): Rao,Archana C  Patient Status: [facility] - Out-pt  History of Present Illness: Erin [name] is a 85 y.o. female who presents at the kind request of Dr. [name] for potential bone marrow biopsy.  She has a past medical history of hypertension, retinal artery occlusion, colon cancer, osteopenia and stage II CKD among other medical problems. Routine lab work recently revealed significant leukocytosis.  CBC with differential on 08/31/2018 showed white count of 34.1, H&H of 10.6/32.6 and a platelet count of 313.  Differential predominantly showed neutrophilia and basophilia with relative lymphopenia and mono cytopenia.  Pathology smear review showed leukoerythroblastic blood cells with immature granulocytic cells, nucleated RBCs and abnormal RBC morphology.  Primary hematologic disorder not excluded.    Prior to this her CBC from September 2019 showed a normal white count of 6.3.  She presents today with her niece.  She is in her usual state of health and denies any acute symptoms or issues.  She is NPO and ready to proceed.    Past Medical History:  Diagnosis Date  . Diabetes mellitus    borderline  . Encephalopathy   . Hyperlipidemia   . Hypertension   . Memory loss   . Obesity   . Osteoarthritis   . Osteoporosis   . Sacroiliac joint dysfunction 04/01/2012  . 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 APPENDECTOMY    . cervicitis  1964   conization   . CHOLECYSTECTOMY    . COLON RESECTION  1988   secondary to cancer  . endometrial polyp  05/2000   hyperplasia-laser treatment  . JOINT REPLACEMENT     Rt knee replacement  . POLYPECTOMY  1998   benign X 2  . TOTAL KNEE ARTHROPLASTY  07/2009   right     Allergies: Fluoxetine hcl; Hydrocodone; Shellfish allergy; Tape; Diclofenac sodium; and  Latex  Medications: Prior to Admission medications   Medication Sig Start Date End Date Taking? Authorizing Provider  acetaminophen (TYLENOL) 325 MG tablet Take 650 mg by mouth every 6 (six) hours as needed for mild pain.   Yes [provider]  amLODipine (NORVASC) 10 MG tablet Take 1 tablet (10 mg total) by mouth daily. 09/05/18  Yes Tower, Wynelle Fanny, MD  aspirin 325 MG tablet Take 325 mg by mouth daily.    Yes [provider]  calcium carbonate (OS-CAL) 600 MG TABS tablet Take 600 mg by mouth daily with breakfast.    Yes [provider]  cephALEXin (KEFLEX) 500 MG capsule Take 1 capsule (500 mg total) by mouth 3 (three) times daily. 09/08/18  Yes Tower, Wynelle Fanny, MD  escitalopram (LEXAPRO) 10 MG tablet Take 1 tablet (10 mg total) by mouth daily. 09/05/18  Yes Tower, Wynelle Fanny, MD  famotidine (PEPCID) 20 MG tablet Take 1 tablet (20 mg total) by mouth 2 (two) times daily. 09/05/18  Yes Tower, Wynelle Fanny, MD  lisinopril (ZESTRIL) 5 MG tablet Take 1 tablet (5 mg total) by mouth daily. 09/05/18  Yes Tower, Wynelle Fanny, MD  NUTRITIONAL SUPPLEMENT LIQD Take 120 mLs by mouth daily.    Yes [provider]  senna-docusate (SENNA-S) 8.6-50 MG tablet TAKE TWO (2) TABLETS BY MOUTH 2 TIMES DAILY FOR CONSTIPATION 01/26/16  Yes Pleas Koch, NP  vitamin C (ASCORBIC ACID) 500 MG tablet  Take 500 mg by mouth daily.    Yes [provider]     Family History  Problem Relation Age of Onset  . Kidney failure Mother   . Stroke Father   . Cancer Brother        lung    Social History   Socioeconomic History  . Marital status: Widowed    Spouse name: Not on file  . Number of children: 1  . Years of education: 2 yrs coll  . Highest education level: Not on file  Occupational History  . Occupation: Retired  Scientific laboratory technician  . Financial resource strain: Not on file  . Food insecurity:    Worry: Never true    Inability: Never true  . Transportation needs:    Medical: No     Non-medical: No  Tobacco Use  . Smoking status: Former Smoker    Types: Cigarettes    Last attempt to quit: 08/14/1962    Years since quitting: 56.1  . Smokeless tobacco: Never Used  . Tobacco comment: Quit over 10 years ago  Substance and Sexual Activity  . Alcohol use: No    Alcohol/week: 0.0 standard drinks  . Drug use: No  . Sexual activity: Not Currently  Lifestyle  . Physical activity:    Days per week: 0 days    Minutes per session: Not on file  . Stress: Not at all  Relationships  . Social connections:    Talks on phone: More than three times a week    Gets together: More than three times a week    Attends religious service: More than 4 times per year    Active member of club or organization: Not on file    Attends meetings of clubs or organizations: Not on file    Relationship status: Widowed  Other Topics Concern  . Not on file  Social History Narrative   Lives with son who is mentally impaired.     She is temporarily in [facility] for rehab.   Right-handed.   No caffeine use.     Review of Systems: A 12 point ROS discussed and pertinent positives are indicated in the HPI above.  All other systems are negative.  Review of Systems  Vital Signs: BP (!) 129/49   Pulse 70   Temp 98.8 F (37.1 C) (Oral)   Resp 17   Ht 5' 10.5" (1.791 m)   SpO2 100%   BMI 25.69 kg/m   Physical Exam Nursing note reviewed.  Constitutional:      Appearance: Normal appearance. She is not ill-appearing.  HENT:     Head: Normocephalic and atraumatic.     Mouth/Throat:     Mouth: Mucous membranes are moist.  Cardiovascular:     Rate and Rhythm: Normal rate and regular rhythm.     Heart sounds: Normal heart sounds.  Pulmonary:     Effort: Pulmonary effort is normal.     Breath sounds: Normal breath sounds.  Neurological:     Mental Status: She is alert.     Imaging: No results found.  Labs:  CBC: Recent Labs    01/09/18 1334 08/31/18  1051 09/06/18 1423 09/12/18 0902  WBC 6.6 34.1 Repeated and verified X2.* 34.0* 37.1*  HGB 10.8* 10.6* 10.6* 10.4*  HCT 33.1* 32.6* 33.8* 33.2*  PLT 389.0 313.0 338 285    COAGS: Recent Labs    09/12/18 0902  INR 1.0    BMP: Recent Labs  12/29/17 1145 01/09/18 1334 08/31/18 1051 09/06/18 1423  NA 137 134* 139 137  K 4.2 4.3 4.6 4.4  CL 103 101 104 103  CO2 _0 GLUCOSE 104* 109* 98 106*  BUN 19 20 25* 32*  CALCIUM 9.8 10.0 9.9 9.6  CREATININE 1.50* 1.33* 1.81* 1.99*  GFRNONAA  --   --   --  22*  GFRAA  --   --   --  25*    LIVER FUNCTION TESTS: Recent Labs    12/21/17 1508 01/09/18 1334 08/31/18 1051 09/06/18 1423  BILITOT 0.5  --  0.5 0.6  AST 20  --  22 25  ALT 8  --  8 11  ALKPHOS 55  --  54 54  PROT 7.6  --  7.7 8.0  ALBUMIN 3.9 3.8 4.0 4.3    TUMOR MARKERS: No results for input(s): AFPTM, CEA, CA199, CHROMGRNA in the last 8760 hours.  Assessment and Plan:  Leukocytosis with smear abnormalities concerning for MPS versus leukemia.  Proceed with bone marrow biopsy.   Risks and benefits of bone marrow biopsy was discussed with the patient and/or patient's family including, but not limited to bleeding, infection, damage to adjacent structures or low yield requiring additional tests.  All of the questions were answered and there is agreement to proceed.  Consent signed and in chart.    Thank you for this interesting consult.  I greatly enjoyed meeting Erin [name] and look forward to participating in their care.  A copy of this report was sent to the requesting provider on this date.  Electronically Signed: Jacqulynn Cadet, MD 09/12/2018, 10:33 AM   I spent a total of  15 Minutes   in face to face in clinical consultation, greater than 50% of which was counseling/coordinating care for leukocytosis.

## 2018-09-12 NOTE — Progress Notes (Signed)
Patient alert/oriented x 4.  Calm and co-operative.  No confusion or agitation noted.  Niece remains with patient in recovery.

## 2018-09-12 NOTE — Progress Notes (Signed)
Received verbal consent from Dr. Laurence Ferrari for patient's niece/POA to stay with patient d/t "she becomes very confused and agitated when I am not with her"-per niece-Kelly Javier Glazier.

## 2018-09-19 ENCOUNTER — Encounter (HOSPITAL_COMMUNITY): Payer: Self-pay | Admitting: Oncology

## 2018-09-20 LAB — BCR-ABL1, CML/ALL, PCR, QUANT

## 2018-09-20 LAB — JAK2 GENOTYPR

## 2018-09-20 LAB — BCR-ABL1 FISH
Cells Analyzed: 200
Cells Counted: 200

## 2018-09-21 ENCOUNTER — Telehealth: Payer: Self-pay | Admitting: *Deleted

## 2018-09-21 ENCOUNTER — Other Ambulatory Visit: Payer: Self-pay

## 2018-09-21 ENCOUNTER — Encounter: Payer: Self-pay | Admitting: *Deleted

## 2018-09-21 ENCOUNTER — Telehealth: Payer: Self-pay | Admitting: Oncology

## 2018-09-21 NOTE — Telephone Encounter (Signed)
Called kelly- niece for patient and left message that I had spoke with her earlier in the week and let he know that we can do video visit with her aunt but both of them need to be together with the video visit. Also that Dr. [name] is wanting me to send patient pathology from bone marrow bx to [facility] for second opinion and we probably will not have it back before June 12 but would let her know.dr. [name] says we cna make if the following Friday 6/19 and see if the second opinion is back. I have made an appt at 2 pm. I have asked niece to call me if this does not work for her. She has ,y cell and work phone number

## 2018-09-21 NOTE — Telephone Encounter (Signed)
Niece called back and said 6/19 at 2 pm is fine with her

## 2018-09-21 NOTE — Telephone Encounter (Signed)
Spoke to pt's niece [name] listed in pt's contacts and completed travel screen. Also explained about addl screening questions they will be asked, new guidelines about mask req, no visitors, and fever checks

## 2018-09-22 ENCOUNTER — Inpatient Hospital Stay: Payer: Medicare Other | Admitting: Oncology

## 2018-09-22 ENCOUNTER — Encounter: Payer: Self-pay | Admitting: *Deleted

## 2018-09-27 ENCOUNTER — Telehealth: Payer: Self-pay | Admitting: *Deleted

## 2018-09-27 DIAGNOSIS — R799 Abnormal finding of blood chemistry, unspecified: Secondary | ICD-10-CM

## 2018-09-27 DIAGNOSIS — D72829 Elevated white blood cell count, unspecified: Secondary | ICD-10-CM

## 2018-09-27 NOTE — Telephone Encounter (Signed)
Called niece to let her know that the slides have not been sent yet to [facility]. The fax I sent her was after the lady on the phone had left for the day and she feels that someone on 3rd shift probably put it somewhere but never brought to the person it was sent to . I checked today to see if slides are at [facility] and that is when I found out it has not been done. They will fedex it and it will be at [facility] on Friday . Also dr [name] wants to see if pt. Can come over to the cancer center and get blood drawn. It is possibility that the lab test could tell Dr. [name] if the patient has a myeloid process going on. Which can be helpful to figure out what is going on. The bone marrow shows abnormal cells but does not give enough direction as to what it is specifically. The abnormal cells point to a malignant dx. But not enough info.  That is why the second opinion is being done. Niece will have patient get labs on Friday afternoon and then we have r/s appt to 6/26 at 3pm. She is agreeable

## 2018-09-29 ENCOUNTER — Inpatient Hospital Stay: Payer: Medicare Other | Admitting: Oncology

## 2018-09-29 ENCOUNTER — Other Ambulatory Visit: Payer: Self-pay

## 2018-09-29 ENCOUNTER — Inpatient Hospital Stay: Payer: Medicare Other | Attending: Oncology

## 2018-09-29 DIAGNOSIS — R799 Abnormal finding of blood chemistry, unspecified: Secondary | ICD-10-CM

## 2018-09-29 DIAGNOSIS — D72829 Elevated white blood cell count, unspecified: Secondary | ICD-10-CM

## 2018-10-06 ENCOUNTER — Ambulatory Visit: Payer: Medicare Other | Admitting: Oncology

## 2018-10-09 ENCOUNTER — Ambulatory Visit: Payer: Medicare Other | Admitting: Oncology

## 2018-10-09 ENCOUNTER — Encounter (HOSPITAL_COMMUNITY): Payer: Self-pay | Admitting: Oncology

## 2018-10-10 LAB — MISC LABCORP TEST (SEND OUT): Labcorp test code: 451953

## 2018-10-11 ENCOUNTER — Telehealth: Payer: Self-pay | Admitting: Oncology

## 2018-10-12 ENCOUNTER — Encounter: Payer: Self-pay | Admitting: Oncology

## 2018-10-12 ENCOUNTER — Other Ambulatory Visit: Payer: Self-pay

## 2018-10-12 ENCOUNTER — Inpatient Hospital Stay: Payer: Medicare Other | Attending: Oncology | Admitting: Oncology

## 2018-10-12 DIAGNOSIS — M810 Age-related osteoporosis without current pathological fracture: Secondary | ICD-10-CM | POA: Insufficient documentation

## 2018-10-12 DIAGNOSIS — D469 Myelodysplastic syndrome, unspecified: Secondary | ICD-10-CM | POA: Insufficient documentation

## 2018-10-12 DIAGNOSIS — R413 Other amnesia: Secondary | ICD-10-CM | POA: Insufficient documentation

## 2018-10-12 DIAGNOSIS — D471 Chronic myeloproliferative disease: Secondary | ICD-10-CM | POA: Diagnosis not present

## 2018-10-12 DIAGNOSIS — M8580 Other specified disorders of bone density and structure, unspecified site: Secondary | ICD-10-CM | POA: Insufficient documentation

## 2018-10-12 DIAGNOSIS — Z7189 Other specified counseling: Secondary | ICD-10-CM

## 2018-10-12 DIAGNOSIS — I10 Essential (primary) hypertension: Secondary | ICD-10-CM | POA: Insufficient documentation

## 2018-10-12 DIAGNOSIS — Z85038 Personal history of other malignant neoplasm of large intestine: Secondary | ICD-10-CM | POA: Insufficient documentation

## 2018-10-12 DIAGNOSIS — Z87891 Personal history of nicotine dependence: Secondary | ICD-10-CM | POA: Diagnosis not present

## 2018-10-12 DIAGNOSIS — E785 Hyperlipidemia, unspecified: Secondary | ICD-10-CM | POA: Insufficient documentation

## 2018-10-12 DIAGNOSIS — Z79899 Other long term (current) drug therapy: Secondary | ICD-10-CM | POA: Insufficient documentation

## 2018-10-12 DIAGNOSIS — D72829 Elevated white blood cell count, unspecified: Secondary | ICD-10-CM | POA: Insufficient documentation

## 2018-10-12 DIAGNOSIS — F17210 Nicotine dependence, cigarettes, uncomplicated: Secondary | ICD-10-CM | POA: Insufficient documentation

## 2018-10-12 DIAGNOSIS — M1990 Unspecified osteoarthritis, unspecified site: Secondary | ICD-10-CM | POA: Insufficient documentation

## 2018-10-12 DIAGNOSIS — Z7982 Long term (current) use of aspirin: Secondary | ICD-10-CM | POA: Insufficient documentation

## 2018-10-12 DIAGNOSIS — N182 Chronic kidney disease, stage 2 (mild): Secondary | ICD-10-CM | POA: Insufficient documentation

## 2018-10-12 MED ORDER — ALLOPURINOL 300 MG PO TABS: 300.0000 mg | ORAL_TABLET | Freq: Every day | ORAL | 3 refills | Status: DC

## 2018-10-12 MED ORDER — HYDROXYUREA 500 MG PO CAPS: 500.0000 mg | ORAL_CAPSULE | Freq: Every day | ORAL | 3 refills | Status: DC

## 2018-10-12 NOTE — Progress Notes (Signed)
Called patient today for Telehealth visit via [place].  Doximity visit will be completed by the use of niece Guss [facility]) phone at [phone].  [name], assisted in reviewing chart with me and states that there are no new concerns today.

## 2018-10-15 NOTE — Progress Notes (Signed)
I connected with Erin [name] on 10/15/18 at  2:45 PM EDT by video enabled telemedicine visit and verified that I am speaking with the correct person using two identifiers.   I discussed the limitations, risks, security and privacy concerns of performing an evaluation and management service by telemedicine and the availability of in-person appointments. I also discussed with the patient that there may be a patient responsible charge related to this service. The patient expressed understanding and agreed to proceed.  Other persons participating in the visit and their role in the encounter:  Patients niece  Patient's location:  home Provider's location:  work  Risk analyst Complaint:  Discuss bone marrow biopsy results and further management  History of present illness: Patient is a 85 year old female with a past medical history significant for hypertension, retinal artery occlusion, colon cancer, osteopenia and stage II CKD among other medical problems. She has been referred to [place] for evaluation of leukocytosis.  CBC with differential on 08/31/2018 showed white count of 34.1, H&H of 10.6/32.6 and a platelet count of 313.  Differential predominantly showed neutrophilia and basophilia with relative lymphopenia and mono cytopenia.  Pathology smear review showed leukoerythroblastic blood cells with immature granulocytic cells, nucleated RBCs and abnormal RBC morphology.  Primary hematologic disorder not excluded.  Prior to this her CBC from September 2019 showed a normal white count of 6.3.   Results of blood work from 09/06/2018 were as follows: CBC showed white count of 34, H&H of 10.6/33.8 and a platelet count of 338.  Absolute neutrophil count was elevated at 18.  Nucleated RBCs were seen.  Smear review showed absolute leukocytosis with neutrophilia and left shifted maturation.  Circulating blasts present estimated at 5 to 10%.  Basophilia.  Patient had a bone marrow biopsy which was also reviewed at [facility].  It  showed overall features including peripheral leukocytosis with left shifted neutrophilia and basophilia and bone marrow hypercellularity including granulocytic hyperplasia and mild morphologic dysplasia are not entirely specific but are consistent with a myeloid neoplasm.  The absence of 9: 22 by outside [facility] and molecular studies excludes CML.  The findings are most suggestive of MDS/MPN and diagnostic considerations include atypical CML or MDS/MPN unclassifiable.  Bone marrow blasts 8%.  Peripheral blood Jak 2 and BCR ABL testing was negative.Cytogenetics was normal.  New genomic panel showed a SX L1, SF 3 B1, SRS F2 mutations.   Interval history. Patient feels fatigued. Appetite is fair. Denies any drenching night sweats   ROS  Allergies  Allergen Reactions  . Fluoxetine Hcl Nausea And Vomiting  . Hydrocodone Nausea And Vomiting  . Shellfish Allergy Nausea And Vomiting    Makes the patient feel "sick"  . Tape Other (See Comments)    Tears and bruises; please use Coban wrap  . Diclofenac Sodium Nausea And Vomiting  . Latex Rash    Past Medical History:  Diagnosis Date  . Diabetes mellitus    borderline  . Encephalopathy   . Hyperlipidemia   . Hypertension   . Memory loss   . Obesity   . Osteoarthritis   . Osteoporosis   . Sacroiliac joint dysfunction 04/01/2012  . 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 APPENDECTOMY    . cervicitis  1964   conization   . CHOLECYSTECTOMY    . COLON RESECTION  1988   secondary to cancer  . endometrial polyp  05/2000   hyperplasia-laser treatment  .  JOINT REPLACEMENT     Rt knee replacement  . POLYPECTOMY  1998   benign X 2  . TOTAL KNEE ARTHROPLASTY  07/2009   right     Social History   Socioeconomic History  . Marital status: Widowed    Spouse name: Not on file  . Number of children: 1  . Years of education: 2 yrs coll  . Highest education level: Not on file   Occupational History  . Occupation: Retired  Scientific laboratory technician  . Financial resource strain: Not on file  . Food insecurity    Worry: Never true    Inability: Never true  . Transportation needs    Medical: No    Non-medical: No  Tobacco Use  . Smoking status: Former Smoker    Types: Cigarettes    Quit date: 08/14/1962    Years since quitting: 56.2  . Smokeless tobacco: Never Used  . Tobacco comment: Quit over 10 years ago  Substance and Sexual Activity  . Alcohol use: No    Alcohol/week: 0.0 standard drinks  . Drug use: No  . Sexual activity: Not Currently  Lifestyle  . Physical activity    Days per week: 0 days    Minutes per session: Not on file  . Stress: Not at all  Relationships  . Social connections    Talks on phone: More than three times a week    Gets together: More than three times a week    Attends religious service: More than 4 times per year    Active member of club or organization: Not on file    Attends meetings of clubs or organizations: Not on file    Relationship status: Widowed  . Intimate partner violence    Fear of current or ex partner: No    Emotionally abused: Not on file    Physically abused: No    Forced sexual activity: No  Other Topics Concern  . Not on file  Social History Narrative   Lives with son who is mentally impaired.     She is temporarily in [facility] for rehab.   Right-handed.   No caffeine use.    Family History  Problem Relation Age of Onset  . Kidney failure Mother   . Stroke Father   . Cancer Brother        lung     Current Outpatient Medications:  .  acetaminophen (TYLENOL) 325 MG tablet, Take 650 mg by mouth every 6 (six) hours as needed for mild pain., Disp: , Rfl:  .  amLODipine (NORVASC) 10 MG tablet, Take 1 tablet (10 mg total) by mouth daily., Disp: 90 tablet, Rfl: 3 .  aspirin 325 MG tablet, Take 325 mg by mouth daily. , Disp: , Rfl:  .  calcium carbonate (OS-CAL) 600 MG TABS tablet,  Take 600 mg by mouth daily with breakfast. , Disp: , Rfl:  .  escitalopram (LEXAPRO) 10 MG tablet, Take 1 tablet (10 mg total) by mouth daily., Disp: 90 tablet, Rfl: 3 .  famotidine (PEPCID) 20 MG tablet, Take 1 tablet (20 mg total) by mouth 2 (two) times daily., Disp: 180 tablet, Rfl: 3 .  lisinopril (ZESTRIL) 5 MG tablet, Take 1 tablet (5 mg total) by mouth daily., Disp: 90 tablet, Rfl: 3 .  NUTRITIONAL SUPPLEMENT LIQD, Take 120 mLs by mouth daily. , Disp: , Rfl:  .  senna-docusate (SENNA-S) 8.6-50 MG tablet, TAKE TWO (2) TABLETS BY MOUTH 2 TIMES DAILY FOR  CONSTIPATION, Disp: 120 tablet, Rfl: 0 .  vitamin C (ASCORBIC ACID) 500 MG tablet, Take 500 mg by mouth daily. , Disp: , Rfl:  .  allopurinol (ZYLOPRIM) 300 MG tablet, Take 1 tablet (300 mg total) by mouth daily., Disp: 30 tablet, Rfl: 3 .  hydroxyurea (HYDREA) 500 MG capsule, Take 1 capsule (500 mg total) by mouth daily. May take with food to minimize GI side effects., Disp: 30 capsule, Rfl: 3  No results found.  No images are attached to the encounter.   CMP Latest Ref Rng & Units 09/06/2018  Glucose 70 - 99 mg/dL 106(H)  BUN 8 - 23 mg/dL 32(H)  Creatinine 0.44 - 1.00 mg/dL 1.99(H)  Sodium 135 - 145 mmol/L 137  Potassium 3.5 - 5.1 mmol/L 4.4  Chloride 98 - 111 mmol/L 103  CO2 22 - 32 mmol/L 24  Calcium 8.9 - 10.3 mg/dL 9.6  Total Protein 6.5 - 8.1 g/dL 8.0  Total Bilirubin 0.3 - 1.2 mg/dL 0.6  Alkaline Phos 38 - 126 U/L 54  AST 15 - 41 U/L 25  ALT 0 - 44 U/L 11   CBC Latest Ref Rng & Units 09/12/2018  WBC 4.0 - 10.5 K/uL 37.1(H)  Hemoglobin 12.0 - 15.0 g/dL 10.4(L)  Hematocrit 36.0 - 46.0 % 33.2(L)  Platelets 150 - 400 K/uL 285     Observation/objective:appears in no acute distress over video visit today. Breathing is non labored  Assessment and plan:patient is a 85 yr old with leucocytosis, anemia. This is a visit to discuss bone marrow biopsy results  Bone marrow biopsy shows features of MDS/MPN unclassifiable. She has  8% blasts in her marrow. 3-5% peripheral blood blasts. NGS shows ASXL1 which is a poor prognostic factor. Given her age and frailty, I discussed trial of hydrea at 500 mg daily to keep her wbc and blast count down. Discussed risks and benefits of hydrea including all but not limited to fatigue, dizziness, mouth sores, skin rash and diarrhea. Treatment will be given with a palliative intent. Discussed there is a risk of progression to acute leukemia with MDS/MPN. If counts are not controlled with hydrea and blast count keeps increasing, we will switch her to vidaza.  Patient and her niece verbalized understanding  Follow-up instructions:  I discussed the assessment and treatment plan with the patient. The patient was provided an opportunity to ask questions and all were answered. The patient agreed with the plan and demonstrated an understanding of the instructions.   The patient was advised to call back or seek an in-person evaluation if the symptoms worsen or if the condition fails to improve as anticipated.    Visit Diagnosis: 1. Myeloproliferative neoplasm [facility])     Dr. Randa Evens, MD, MPH [facility] at [facility] [phone] 10/15/2018 8:06 PM

## 2018-10-26 ENCOUNTER — Other Ambulatory Visit: Payer: Medicare Other

## 2018-10-27 ENCOUNTER — Inpatient Hospital Stay: Payer: Medicare Other

## 2018-10-27 ENCOUNTER — Other Ambulatory Visit: Payer: Self-pay

## 2018-10-27 DIAGNOSIS — D471 Chronic myeloproliferative disease: Secondary | ICD-10-CM

## 2018-10-27 DIAGNOSIS — N182 Chronic kidney disease, stage 2 (mild): Secondary | ICD-10-CM | POA: Diagnosis not present

## 2018-10-27 DIAGNOSIS — M8580 Other specified disorders of bone density and structure, unspecified site: Secondary | ICD-10-CM | POA: Diagnosis not present

## 2018-10-27 DIAGNOSIS — M1990 Unspecified osteoarthritis, unspecified site: Secondary | ICD-10-CM | POA: Diagnosis not present

## 2018-10-27 DIAGNOSIS — Z79899 Other long term (current) drug therapy: Secondary | ICD-10-CM | POA: Diagnosis not present

## 2018-10-27 DIAGNOSIS — M810 Age-related osteoporosis without current pathological fracture: Secondary | ICD-10-CM | POA: Diagnosis not present

## 2018-10-27 DIAGNOSIS — D72829 Elevated white blood cell count, unspecified: Secondary | ICD-10-CM | POA: Diagnosis not present

## 2018-10-27 DIAGNOSIS — R413 Other amnesia: Secondary | ICD-10-CM | POA: Diagnosis not present

## 2018-10-27 DIAGNOSIS — E785 Hyperlipidemia, unspecified: Secondary | ICD-10-CM | POA: Diagnosis not present

## 2018-10-27 DIAGNOSIS — I10 Essential (primary) hypertension: Secondary | ICD-10-CM | POA: Diagnosis not present

## 2018-10-27 DIAGNOSIS — F17210 Nicotine dependence, cigarettes, uncomplicated: Secondary | ICD-10-CM | POA: Diagnosis not present

## 2018-10-27 DIAGNOSIS — Z85038 Personal history of other malignant neoplasm of large intestine: Secondary | ICD-10-CM | POA: Diagnosis not present

## 2018-10-27 DIAGNOSIS — Z7982 Long term (current) use of aspirin: Secondary | ICD-10-CM | POA: Diagnosis not present

## 2018-10-27 DIAGNOSIS — D469 Myelodysplastic syndrome, unspecified: Secondary | ICD-10-CM | POA: Diagnosis not present

## 2018-10-27 LAB — COMPREHENSIVE METABOLIC PANEL
ALT: 10 U/L (ref 0–44)
AST: 24 U/L (ref 15–41)
Albumin: 3.9 g/dL (ref 3.5–5.0)
Alkaline Phosphatase: 65 U/L (ref 38–126)
Anion gap: 9 (ref 5–15)
BUN: 35 mg/dL — ABNORMAL HIGH (ref 8–23)
CO2: 22 mmol/L (ref 22–32)
Calcium: 9.7 mg/dL (ref 8.9–10.3)
Chloride: 104 mmol/L (ref 98–111)
Creatinine, Ser: 1.97 mg/dL — ABNORMAL HIGH (ref 0.44–1.00)
GFR calc Af Amer: 26 mL/min — ABNORMAL LOW (ref >60)
GFR calc non Af Amer: 22 mL/min — ABNORMAL LOW (ref >60)
Glucose, Bld: 93 mg/dL (ref 70–99)
Potassium: 4.9 mmol/L (ref 3.5–5.1)
Sodium: 135 mmol/L (ref 135–145)
Total Bilirubin: 0.6 mg/dL (ref 0.3–1.2)
Total Protein: 8.2 g/dL — ABNORMAL HIGH (ref 6.5–8.1)

## 2018-10-27 LAB — CBC WITH DIFFERENTIAL/PLATELET
Abs Immature Granulocytes: 1.91 10*3/uL — ABNORMAL HIGH (ref 0.00–0.07)
Basophils Absolute: 1.3 10*3/uL — ABNORMAL HIGH (ref 0.0–0.1)
Basophils Relative: 8 %
Eosinophils Absolute: 1 10*3/uL — ABNORMAL HIGH (ref 0.0–0.5)
Eosinophils Relative: 6 %
HCT: 31.4 % — ABNORMAL LOW (ref 36.0–46.0)
Hemoglobin: 9.9 g/dL — ABNORMAL LOW (ref 12.0–15.0)
Immature Granulocytes: 12 %
Lymphocytes Relative: 13 %
Lymphs Abs: 2.2 10*3/uL (ref 0.7–4.0)
MCH: 26.5 pg (ref 26.0–34.0)
MCHC: 31.5 g/dL (ref 30.0–36.0)
MCV: 84 fL (ref 80.0–100.0)
Monocytes Absolute: 1.1 10*3/uL — ABNORMAL HIGH (ref 0.1–1.0)
Monocytes Relative: 7 %
Neutro Abs: 8.7 10*3/uL — ABNORMAL HIGH (ref 1.7–7.7)
Neutrophils Relative %: 54 %
Platelets: 134 10*3/uL — ABNORMAL LOW (ref 150–400)
RBC: 3.74 MIL/uL — ABNORMAL LOW (ref 3.87–5.11)
RDW: 20.1 % — ABNORMAL HIGH (ref 11.5–15.5)
Smear Review: NORMAL
WBC: 16.2 10*3/uL — ABNORMAL HIGH (ref 4.0–10.5)
nRBC: 1.7 % — ABNORMAL HIGH (ref 0.0–0.2)

## 2018-11-09 ENCOUNTER — Other Ambulatory Visit: Payer: Medicare Other

## 2018-11-09 ENCOUNTER — Other Ambulatory Visit: Payer: Self-pay

## 2018-11-09 ENCOUNTER — Ambulatory Visit: Payer: Medicare Other | Admitting: Oncology

## 2018-11-10 ENCOUNTER — Inpatient Hospital Stay (HOSPITAL_BASED_OUTPATIENT_CLINIC_OR_DEPARTMENT_OTHER): Payer: Medicare Other | Admitting: Oncology

## 2018-11-10 ENCOUNTER — Encounter: Payer: Self-pay | Admitting: Oncology

## 2018-11-10 ENCOUNTER — Inpatient Hospital Stay: Payer: Medicare Other

## 2018-11-10 ENCOUNTER — Other Ambulatory Visit: Payer: Self-pay

## 2018-11-10 VITALS — BP 128/70 | HR 79 | Temp 97.7°F | Resp 18 | Ht 70.5 in | Wt 178.9 lb

## 2018-11-10 DIAGNOSIS — M810 Age-related osteoporosis without current pathological fracture: Secondary | ICD-10-CM

## 2018-11-10 DIAGNOSIS — F17210 Nicotine dependence, cigarettes, uncomplicated: Secondary | ICD-10-CM

## 2018-11-10 DIAGNOSIS — Z85038 Personal history of other malignant neoplasm of large intestine: Secondary | ICD-10-CM

## 2018-11-10 DIAGNOSIS — D471 Chronic myeloproliferative disease: Secondary | ICD-10-CM

## 2018-11-10 DIAGNOSIS — D72829 Elevated white blood cell count, unspecified: Secondary | ICD-10-CM

## 2018-11-10 DIAGNOSIS — R413 Other amnesia: Secondary | ICD-10-CM

## 2018-11-10 DIAGNOSIS — Z79899 Other long term (current) drug therapy: Secondary | ICD-10-CM

## 2018-11-10 DIAGNOSIS — M8580 Other specified disorders of bone density and structure, unspecified site: Secondary | ICD-10-CM

## 2018-11-10 DIAGNOSIS — D469 Myelodysplastic syndrome, unspecified: Secondary | ICD-10-CM | POA: Diagnosis not present

## 2018-11-10 DIAGNOSIS — E785 Hyperlipidemia, unspecified: Secondary | ICD-10-CM

## 2018-11-10 DIAGNOSIS — Z7982 Long term (current) use of aspirin: Secondary | ICD-10-CM

## 2018-11-10 DIAGNOSIS — M1990 Unspecified osteoarthritis, unspecified site: Secondary | ICD-10-CM

## 2018-11-10 DIAGNOSIS — N182 Chronic kidney disease, stage 2 (mild): Secondary | ICD-10-CM

## 2018-11-10 DIAGNOSIS — I10 Essential (primary) hypertension: Secondary | ICD-10-CM

## 2018-11-10 LAB — COMPREHENSIVE METABOLIC PANEL
ALT: 10 U/L (ref 0–44)
AST: 24 U/L (ref 15–41)
Albumin: 3.9 g/dL (ref 3.5–5.0)
Alkaline Phosphatase: 76 U/L (ref 38–126)
Anion gap: 10 (ref 5–15)
BUN: 28 mg/dL — ABNORMAL HIGH (ref 8–23)
CO2: 20 mmol/L — ABNORMAL LOW (ref 22–32)
Calcium: 9.6 mg/dL (ref 8.9–10.3)
Chloride: 107 mmol/L (ref 98–111)
Creatinine, Ser: 1.74 mg/dL — ABNORMAL HIGH (ref 0.44–1.00)
GFR calc Af Amer: 30 mL/min — ABNORMAL LOW (ref >60)
GFR calc non Af Amer: 26 mL/min — ABNORMAL LOW (ref >60)
Glucose, Bld: 129 mg/dL — ABNORMAL HIGH (ref 70–99)
Potassium: 4.1 mmol/L (ref 3.5–5.1)
Sodium: 137 mmol/L (ref 135–145)
Total Bilirubin: 0.5 mg/dL (ref 0.3–1.2)
Total Protein: 7.8 g/dL (ref 6.5–8.1)

## 2018-11-10 LAB — CBC WITH DIFFERENTIAL/PLATELET
Abs Immature Granulocytes: 3.55 10*3/uL — ABNORMAL HIGH (ref 0.00–0.07)
Basophils Absolute: 1.2 10*3/uL — ABNORMAL HIGH (ref 0.0–0.1)
Basophils Relative: 7 %
Eosinophils Absolute: 1 10*3/uL — ABNORMAL HIGH (ref 0.0–0.5)
Eosinophils Relative: 6 %
HCT: 28.9 % — ABNORMAL LOW (ref 36.0–46.0)
Hemoglobin: 9.1 g/dL — ABNORMAL LOW (ref 12.0–15.0)
Immature Granulocytes: 20 %
Lymphocytes Relative: 12 %
Lymphs Abs: 2.1 10*3/uL (ref 0.7–4.0)
MCH: 27 pg (ref 26.0–34.0)
MCHC: 31.5 g/dL (ref 30.0–36.0)
MCV: 85.8 fL (ref 80.0–100.0)
Monocytes Absolute: 1.1 10*3/uL — ABNORMAL HIGH (ref 0.1–1.0)
Monocytes Relative: 6 %
Neutro Abs: 8.9 10*3/uL — ABNORMAL HIGH (ref 1.7–7.7)
Neutrophils Relative %: 49 %
Platelets: 298 10*3/uL (ref 150–400)
RBC: 3.37 MIL/uL — ABNORMAL LOW (ref 3.87–5.11)
RDW: 20.4 % — ABNORMAL HIGH (ref 11.5–15.5)
Smear Review: NORMAL
WBC: 17.8 10*3/uL — ABNORMAL HIGH (ref 4.0–10.5)
nRBC: 1 % — ABNORMAL HIGH (ref 0.0–0.2)

## 2018-11-10 NOTE — Progress Notes (Signed)
No new changes noted today 

## 2018-11-12 NOTE — Progress Notes (Signed)
Hematology/Oncology Consult note [facility]  [phone] [phone]  Patient Care Team: Glori Bickers, Wynelle Fanny, MD as PCP - General (Family Medicine)   Name of the patient: Erin [name]  [id number]  [birth year]   Date of visit: 11/12/18  Diagnosis- MDS/MPN unclassified  Chief complaint/ Reason for visit- routine f/u of MDS/MPN on hydrea  Heme/Onc history: Patient is a 85 year old female with a past medical history significant for hypertension, retinal artery occlusion, colon cancer, osteopenia and stage II CKD amongother medical problems.She has been referred to [place] for evaluation of leukocytosis. CBC with differential on 08/31/2018 showed white count of 34.1, H&H of 10.6/32.6 and a platelet count of 313. Differential predominantly showed neutrophilia and basophilia with relative lymphopenia and mono cytopenia. Pathology smear review showed leukoerythroblastic blood cells with immature granulocytic cells, nucleated RBCs and abnormal RBC morphology. Primary hematologic disorder not excluded. Prior to this her CBC from September 2019 showed a normal white count of 6.3.   Results of blood work from 09/06/2018 were as follows: CBC showed white count of 34, H&H of 10.6/33.8 and a platelet count of 338.  Absolute neutrophil count was elevated at 18.  Nucleated RBCs were seen.  Smear review showed absolute leukocytosis with neutrophilia and left shifted maturation.  Circulating blasts present estimated at 5 to 10%.  Basophilia.  Patient had a bone marrow biopsy which was also reviewed at [facility].  It showed overall features including peripheral leukocytosis with left shifted neutrophilia and basophilia and bone marrow hypercellularity including granulocytic hyperplasia and mild morphologic dysplasia are not entirely specific but are consistent with a myeloid neoplasm.  The absence of 9: 22 by outside [facility] and molecular studies excludes CML.  The findings are most  suggestive of MDS/MPN and diagnostic considerations include atypical CML or MDS/MPN unclassifiable.  Bone marrow blasts 8%.  Peripheral blood Jak 2 and BCR ABL testing was negative.Cytogenetics was normal.  New genomic panel showed a SX L1, SF 3 B1, SRS F2 mutations.   Interval history- she is tolerating hydrea 500 mg daily well without dizziness, mouth sores or diarrhea. She has chronic fatigue. Appetite and weight have remained stable  ECOG PS- 2 Pain scale- 0   Review of systems- Review of Systems  Constitutional: Positive for malaise/fatigue. Negative for chills, fever and weight loss.  HENT: Negative for congestion, ear discharge and nosebleeds.   Eyes: Negative for blurred vision.  Respiratory: Negative for cough, hemoptysis, sputum production, shortness of breath and wheezing.   Cardiovascular: Negative for chest pain, palpitations, orthopnea and claudication.  Gastrointestinal: Negative for abdominal pain, blood in stool, constipation, diarrhea, heartburn, melena, nausea and vomiting.  Genitourinary: Negative for dysuria, flank pain, frequency, hematuria and urgency.  Musculoskeletal: Negative for back pain, joint pain and myalgias.  Skin: Negative for rash.  Neurological: Negative for dizziness, tingling, focal weakness, seizures, weakness and headaches.  Endo/Heme/Allergies: Does not bruise/bleed easily.  Psychiatric/Behavioral: Negative for depression and suicidal ideas. The patient does not have insomnia.       Allergies  Allergen Reactions  . Fluoxetine Hcl Nausea And Vomiting  . Hydrocodone Nausea And Vomiting  . Shellfish Allergy Nausea And Vomiting    Makes the patient feel "sick"  . Tape Other (See Comments)    Tears and bruises; please use Coban wrap  . Diclofenac Sodium Nausea And Vomiting  . Latex Rash     Past Medical History:  Diagnosis Date  . Diabetes mellitus    borderline  . Encephalopathy   .  Hyperlipidemia   . Hypertension   . Memory loss   .  Obesity   . Osteoarthritis   . Osteoporosis   . Sacroiliac joint dysfunction 04/01/2012  . 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 APPENDECTOMY    . cervicitis  1964   conization   . CHOLECYSTECTOMY    . COLON RESECTION  1988   secondary to cancer  . endometrial polyp  05/2000   hyperplasia-laser treatment  . JOINT REPLACEMENT     Rt knee replacement  . POLYPECTOMY  1998   benign X 2  . TOTAL KNEE ARTHROPLASTY  07/2009   right     Social History   Socioeconomic History  . Marital status: Widowed    Spouse name: Not on file  . Number of children: 1  . Years of education: 2 yrs coll  . Highest education level: Not on file  Occupational History  . Occupation: Retired  Scientific laboratory technician  . Financial resource strain: Not on file  . Food insecurity    Worry: Never true    Inability: Never true  . Transportation needs    Medical: No    Non-medical: No  Tobacco Use  . Smoking status: Former Smoker    Types: Cigarettes    Quit date: 08/14/1962    Years since quitting: 56.2  . Smokeless tobacco: Never Used  . Tobacco comment: Quit over 10 years ago  Substance and Sexual Activity  . Alcohol use: No    Alcohol/week: 0.0 standard drinks  . Drug use: No  . Sexual activity: Not Currently  Lifestyle  . Physical activity    Days per week: 0 days    Minutes per session: Not on file  . Stress: Not at all  Relationships  . Social connections    Talks on phone: More than three times a week    Gets together: More than three times a week    Attends religious service: More than 4 times per year    Active member of club or organization: Not on file    Attends meetings of clubs or organizations: Not on file    Relationship status: Widowed  . Intimate partner violence    Fear of current or ex partner: No    Emotionally abused: Not on file    Physically abused: No    Forced sexual activity: No  Other Topics  Concern  . Not on file  Social History Narrative   Lives with son who is mentally impaired.     She is temporarily in [facility] for rehab.   Right-handed.   No caffeine use.    Family History  Problem Relation Age of Onset  . Kidney failure Mother   . Stroke Father   . Cancer Brother        lung     Current Outpatient Medications:  .  allopurinol (ZYLOPRIM) 300 MG tablet, Take 1 tablet (300 mg total) by mouth daily., Disp: 30 tablet, Rfl: 3 .  amLODipine (NORVASC) 10 MG tablet, Take 1 tablet (10 mg total) by mouth daily., Disp: 90 tablet, Rfl: 3 .  aspirin 325 MG tablet, Take 325 mg by mouth daily. , Disp: , Rfl:  .  calcium carbonate (OS-CAL) 600 MG TABS tablet, Take 600 mg by mouth daily with breakfast. , Disp: , Rfl:  .  escitalopram (LEXAPRO) 10 MG tablet, Take 1 tablet (  10 mg total) by mouth daily., Disp: 90 tablet, Rfl: 3 .  famotidine (PEPCID) 20 MG tablet, Take 1 tablet (20 mg total) by mouth 2 (two) times daily., Disp: 180 tablet, Rfl: 3 .  hydroxyurea (HYDREA) 500 MG capsule, Take 1 capsule (500 mg total) by mouth daily. May take with food to minimize GI side effects., Disp: 30 capsule, Rfl: 3 .  lisinopril (ZESTRIL) 5 MG tablet, Take 1 tablet (5 mg total) by mouth daily., Disp: 90 tablet, Rfl: 3 .  NUTRITIONAL SUPPLEMENT LIQD, Take 120 mLs by mouth daily. , Disp: , Rfl:  .  senna-docusate (SENNA-S) 8.6-50 MG tablet, TAKE TWO (2) TABLETS BY MOUTH 2 TIMES DAILY FOR CONSTIPATION, Disp: 120 tablet, Rfl: 0 .  vitamin C (ASCORBIC ACID) 500 MG tablet, Take 500 mg by mouth daily. , Disp: , Rfl:  .  acetaminophen (TYLENOL) 325 MG tablet, Take 650 mg by mouth every 6 (six) hours as needed for mild pain., Disp: , Rfl:   Physical exam:  Vitals:   11/10/18 1310  BP: 128/70  Pulse: 79  Resp: 18  Temp: 97.7 F (36.5 C)  TempSrc: Oral  SpO2: 99%  Weight: 178 lb 14.4 oz (81.1 kg)  Height: 5' 10.5" (1.791 m)   Physical Exam Constitutional:       Comments: She is sitting in a wheelchair  HENT:     Head: Normocephalic and atraumatic.  Eyes:     Pupils: Pupils are equal, round, and reactive to light.  Neck:     Musculoskeletal: Normal range of motion.  Cardiovascular:     Rate and Rhythm: Normal rate and regular rhythm.     Heart sounds: Normal heart sounds.  Pulmonary:     Effort: Pulmonary effort is normal.     Breath sounds: Normal breath sounds.  Abdominal:     General: Bowel sounds are normal.     Palpations: Abdomen is soft.  Skin:    General: Skin is warm and dry.  Neurological:     Mental Status: She is alert and oriented to person, place, and time.      CMP Latest Ref Rng & Units 11/10/2018  Glucose 70 - 99 mg/dL 129(H)  BUN 8 - 23 mg/dL 28(H)  Creatinine 0.44 - 1.00 mg/dL 1.74(H)  Sodium 135 - 145 mmol/L 137  Potassium 3.5 - 5.1 mmol/L 4.1  Chloride 98 - 111 mmol/L 107  CO2 22 - 32 mmol/L 20(L)  Calcium 8.9 - 10.3 mg/dL 9.6  Total Protein 6.5 - 8.1 g/dL 7.8  Total Bilirubin 0.3 - 1.2 mg/dL 0.5  Alkaline Phos 38 - 126 U/L 76  AST 15 - 41 U/L 24  ALT 0 - 44 U/L 10   CBC Latest Ref Rng & Units 11/10/2018  WBC 4.0 - 10.5 K/uL 17.8(H)  Hemoglobin 12.0 - 15.0 g/dL 9.1(L)  Hematocrit 36.0 - 46.0 % 28.9(L)  Platelets 150 - 400 K/uL 298      Assessment and plan- Patient is a 85 y.o. female with MDS/MPN unclassifiable. She is on hydrea and here for routine f/u  After starting hydrea wbc has gone down from 30's to 17.8. platelets are normal. Hb stable around 9-10. She has 5-10% perioheral blood blast count. High risk of progression to overt acute leukemia.  Given her age and frailty , she is on palliative hydrea. If her peripheral blood blast count increases consistently, will switch her to vidaza  RTC in 1 month- cbc with diff,cmp, uric acid, ldh and path  smear review   Visit Diagnosis 1. MDS/MPN (myelodysplastic/myeloproliferative neoplasms) ([place])   2. High risk medication use      Dr. Randa Evens,  MD, MPH [facility] at [facility] [phone] 11/12/2018 1:27 PM

## 2018-12-11 ENCOUNTER — Inpatient Hospital Stay (HOSPITAL_BASED_OUTPATIENT_CLINIC_OR_DEPARTMENT_OTHER): Payer: Medicare Other | Admitting: Oncology

## 2018-12-11 ENCOUNTER — Inpatient Hospital Stay: Payer: Medicare Other

## 2018-12-11 ENCOUNTER — Other Ambulatory Visit: Payer: Self-pay

## 2018-12-11 ENCOUNTER — Inpatient Hospital Stay: Payer: Medicare Other | Attending: Oncology

## 2018-12-11 VITALS — BP 127/49 | HR 64 | Temp 98.4°F | Resp 16 | Wt 180.7 lb

## 2018-12-11 DIAGNOSIS — N182 Chronic kidney disease, stage 2 (mild): Secondary | ICD-10-CM | POA: Diagnosis not present

## 2018-12-11 DIAGNOSIS — M1990 Unspecified osteoarthritis, unspecified site: Secondary | ICD-10-CM | POA: Insufficient documentation

## 2018-12-11 DIAGNOSIS — N189 Chronic kidney disease, unspecified: Secondary | ICD-10-CM

## 2018-12-11 DIAGNOSIS — Z79899 Other long term (current) drug therapy: Secondary | ICD-10-CM | POA: Diagnosis not present

## 2018-12-11 DIAGNOSIS — M8580 Other specified disorders of bone density and structure, unspecified site: Secondary | ICD-10-CM | POA: Insufficient documentation

## 2018-12-11 DIAGNOSIS — M542 Cervicalgia: Secondary | ICD-10-CM

## 2018-12-11 DIAGNOSIS — Z85038 Personal history of other malignant neoplasm of large intestine: Secondary | ICD-10-CM | POA: Diagnosis not present

## 2018-12-11 DIAGNOSIS — E119 Type 2 diabetes mellitus without complications: Secondary | ICD-10-CM | POA: Diagnosis not present

## 2018-12-11 DIAGNOSIS — Z7982 Long term (current) use of aspirin: Secondary | ICD-10-CM | POA: Insufficient documentation

## 2018-12-11 DIAGNOSIS — Z87891 Personal history of nicotine dependence: Secondary | ICD-10-CM | POA: Insufficient documentation

## 2018-12-11 DIAGNOSIS — E785 Hyperlipidemia, unspecified: Secondary | ICD-10-CM | POA: Diagnosis not present

## 2018-12-11 DIAGNOSIS — D469 Myelodysplastic syndrome, unspecified: Secondary | ICD-10-CM | POA: Insufficient documentation

## 2018-12-11 DIAGNOSIS — E669 Obesity, unspecified: Secondary | ICD-10-CM | POA: Diagnosis not present

## 2018-12-11 DIAGNOSIS — I129 Hypertensive chronic kidney disease with stage 1 through stage 4 chronic kidney disease, or unspecified chronic kidney disease: Secondary | ICD-10-CM | POA: Diagnosis not present

## 2018-12-11 LAB — CBC WITH DIFFERENTIAL/PLATELET
Abs Immature Granulocytes: 0.3 10*3/uL — ABNORMAL HIGH (ref 0.00–0.07)
Basophils Absolute: 0.8 10*3/uL — ABNORMAL HIGH (ref 0.0–0.1)
Basophils Relative: 9 %
Blasts: 2 %
Eosinophils Absolute: 1.1 10*3/uL — ABNORMAL HIGH (ref 0.0–0.5)
Eosinophils Relative: 12 %
HCT: 28.8 % — ABNORMAL LOW (ref 36.0–46.0)
Hemoglobin: 9 g/dL — ABNORMAL LOW (ref 12.0–15.0)
Lymphocytes Relative: 11 %
Lymphs Abs: 1 10*3/uL (ref 0.7–4.0)
MCH: 28.5 pg (ref 26.0–34.0)
MCHC: 31.3 g/dL (ref 30.0–36.0)
MCV: 91.1 fL (ref 80.0–100.0)
Metamyelocytes Relative: 1 %
Monocytes Absolute: 0.1 10*3/uL (ref 0.1–1.0)
Monocytes Relative: 1 %
Myelocytes: 1 %
Neutro Abs: 5.7 10*3/uL (ref 1.7–7.7)
Neutrophils Relative %: 62 %
Platelets: 246 10*3/uL (ref 150–400)
Promyelocytes Relative: 1 %
RBC: 3.16 MIL/uL — ABNORMAL LOW (ref 3.87–5.11)
RDW: 22.6 % — ABNORMAL HIGH (ref 11.5–15.5)
Smear Review: NORMAL
WBC Morphology: 2
WBC: 9.2 10*3/uL (ref 4.0–10.5)
nRBC: 0.2 % (ref 0.0–0.2)

## 2018-12-11 LAB — LACTATE DEHYDROGENASE: LDH: 183 U/L (ref 98–192)

## 2018-12-11 LAB — COMPREHENSIVE METABOLIC PANEL
ALT: 12 U/L (ref 0–44)
AST: 27 U/L (ref 15–41)
Albumin: 4 g/dL (ref 3.5–5.0)
Alkaline Phosphatase: 56 U/L (ref 38–126)
Anion gap: 8 (ref 5–15)
BUN: 29 mg/dL — ABNORMAL HIGH (ref 8–23)
CO2: 23 mmol/L (ref 22–32)
Calcium: 9.5 mg/dL (ref 8.9–10.3)
Chloride: 103 mmol/L (ref 98–111)
Creatinine, Ser: 2.04 mg/dL — ABNORMAL HIGH (ref 0.44–1.00)
GFR calc Af Amer: 25 mL/min — ABNORMAL LOW (ref >60)
GFR calc non Af Amer: 21 mL/min — ABNORMAL LOW (ref >60)
Glucose, Bld: 107 mg/dL — ABNORMAL HIGH (ref 70–99)
Potassium: 4.6 mmol/L (ref 3.5–5.1)
Sodium: 134 mmol/L — ABNORMAL LOW (ref 135–145)
Total Bilirubin: 0.5 mg/dL (ref 0.3–1.2)
Total Protein: 8 g/dL (ref 6.5–8.1)

## 2018-12-11 LAB — URIC ACID: Uric Acid, Serum: 2.9 mg/dL (ref 2.5–7.1)

## 2018-12-11 LAB — PATHOLOGIST SMEAR REVIEW

## 2018-12-11 MED ORDER — ACETAMINOPHEN 325 MG PO TABS
650.0000 mg | ORAL_TABLET | Freq: Four times a day (QID) | ORAL | Status: AC | PRN
  Administered 2018-12-11: 650 mg via ORAL
  Filled 2018-12-11: qty 2

## 2018-12-11 NOTE — Progress Notes (Signed)
Pt states she does not feel any different from any other day. Eats good she says. Does not know her meds so I will call her niece kelly

## 2018-12-11 NOTE — Progress Notes (Unsigned)
Pt was ready to go downstairs and get in car with caregiver. She told me she was hurting and pointed to back of neck at c 7 level and it went up the back of her head. I called her POA [name] on the phone and she states that she gets that way when she is using her I pad a lot or sitting with her head pointing down. That was the way she was sitting. [name] POA asked me to give her 2 tylenol. I called Dr. [name] and she said it is ok. I even has NP Josh to check her because she states she was hurting so much 9 of pain. She had no other issues except the pain. She was holding her head. I went and got tylenol and the pain was almost gone and she felt so much better but wanted the tylenol just in case it started back on the way home. I gave her gingerale and she took 2 tylenol and I took her downstairs in wheelchair and helped her in the car. I told her caregiver what happened and I had already spoke to [place] and that she had tylenol.

## 2018-12-12 ENCOUNTER — Other Ambulatory Visit: Payer: Self-pay | Admitting: *Deleted

## 2018-12-12 ENCOUNTER — Telehealth: Payer: Self-pay | Admitting: *Deleted

## 2018-12-12 MED ORDER — ALLOPURINOL 100 MG PO TABS: 50.0000 mg | ORAL_TABLET | ORAL | 1 refills | Status: DC

## 2018-12-12 NOTE — Telephone Encounter (Signed)
Dr. [name] spoke to Erin [name] yest. And send me a message to call her and let her know that I am sending rx to pharmacy for decrease in allopurinol she will be taking 1/2 tablet every other day of a 100 mg tablet. I sent it in to her pharmacy. [name] is aware of the change and that I am sending it in.

## 2018-12-13 ENCOUNTER — Encounter: Payer: Self-pay | Admitting: Oncology

## 2018-12-13 NOTE — Progress Notes (Signed)
Hematology/Oncology Consult note [facility]  Telephone:([phone] [phone]  Patient Care Team: Glori Bickers, Wynelle Fanny, MD as PCP - General (Family Medicine)   Name of the patient: Erin [name]  [id number]  1928/10/15   Date of visit: 12/13/18  Diagnosis- MDS/ MPN unclassified  Chief complaint/ Reason for visit- routine f/u of MDS/MPN on hydrea  Heme/Onc history: Patient is a 85 year old female with a past medical history significant for hypertension, retinal artery occlusion, colon cancer, osteopenia and stage II CKD amongother medical problems.She has been referred to [place] for evaluation of leukocytosis. CBC with differential on 08/31/2018 showed white count of 34.1, H&H of 10.6/32.6 and a platelet count of 313. Differential predominantly showed neutrophilia and basophilia with relative lymphopenia and mono cytopenia. Pathology smear review showed leukoerythroblastic blood cells with immature granulocytic cells, nucleated RBCs and abnormal RBC morphology. Primary hematologic disorder not excluded. Prior to this her CBC from September 2019 showed a normal white count of 6.3.  Results of blood work from 09/06/2018 were as follows: CBC showed white count of 34, H&H of 10.6/33.8 and a platelet count of 338. Absolute neutrophil count was elevated at 18. Nucleated RBCs were seen. Smear review showed absolute leukocytosis with neutrophilia and left shifted maturation. Circulating blasts present estimated at 5 to 10%. Basophilia. Patient had a bone marrow biopsy which was also reviewed at [facility]. It showed overall features including peripheral leukocytosis with left shifted neutrophilia and basophilia and bone marrow hypercellularity including granulocytic hyperplasia and mild morphologic dysplasia are not entirely specific but are consistent with a myeloid neoplasm. The absence of 9:22 by outside [facility] and molecular studies excludes CML. The findings are most  suggestive of MDS/MPN and diagnostic considerations include atypical CML or MDS/MPN unclassifiable.Bone marrow blasts 8%. Peripheral blood Jak 2 and BCR ABL testing was negative.Cytogenetics was normal. New genomic panel showed a SX L1, SF 3 B1, SRS F2 mutations  Interval history- she is tolerating hydrea well without signifcant side effects. No skin rash or diarrhea  ECOG PS- 2 Pain scale- 0   Review of systems- Review of Systems  Constitutional: Positive for malaise/fatigue. Negative for chills, fever and weight loss.  HENT: Negative for congestion, ear discharge and nosebleeds.   Eyes: Negative for blurred vision.  Respiratory: Negative for cough, hemoptysis, sputum production, shortness of breath and wheezing.   Cardiovascular: Negative for chest pain, palpitations, orthopnea and claudication.  Gastrointestinal: Negative for abdominal pain, blood in stool, constipation, diarrhea, heartburn, melena, nausea and vomiting.  Genitourinary: Negative for dysuria, flank pain, frequency, hematuria and urgency.  Musculoskeletal: Negative for back pain, joint pain and myalgias.  Skin: Negative for rash.  Neurological: Negative for dizziness, tingling, focal weakness, seizures, weakness and headaches.  Endo/Heme/Allergies: Does not bruise/bleed easily.  Psychiatric/Behavioral: Negative for depression and suicidal ideas. The patient does not have insomnia.       Allergies  Allergen Reactions  . Fluoxetine Hcl Nausea And Vomiting  . Hydrocodone Nausea And Vomiting  . Shellfish Allergy Nausea And Vomiting    Makes the patient feel "sick"  . Tape Other (See Comments)    Tears and bruises; please use Coban wrap  . Diclofenac Sodium Nausea And Vomiting  . Latex Rash     Past Medical History:  Diagnosis Date  . Diabetes mellitus    borderline  . Encephalopathy   . Hyperlipidemia   . Hypertension   . Memory loss   . Obesity   . Osteoarthritis   . Osteoporosis   .  Sacroiliac joint  dysfunction 04/01/2012  . 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 APPENDECTOMY    . cervicitis  1964   conization   . CHOLECYSTECTOMY    . COLON RESECTION  1988   secondary to cancer  . endometrial polyp  05/2000   hyperplasia-laser treatment  . JOINT REPLACEMENT     Rt knee replacement  . POLYPECTOMY  1998   benign X 2  . TOTAL KNEE ARTHROPLASTY  07/2009   right     Social History   Socioeconomic History  . Marital status: Widowed    Spouse name: Not on file  . Number of children: 1  . Years of education: 2 yrs coll  . Highest education level: Not on file  Occupational History  . Occupation: Retired  Scientific laboratory technician  . Financial resource strain: Not on file  . Food insecurity    Worry: Never true    Inability: Never true  . Transportation needs    Medical: No    Non-medical: No  Tobacco Use  . Smoking status: Former Smoker    Types: Cigarettes    Quit date: 08/14/1962    Years since quitting: 56.3  . Smokeless tobacco: Never Used  . Tobacco comment: Quit over 10 years ago  Substance and Sexual Activity  . Alcohol use: No    Alcohol/week: 0.0 standard drinks  . Drug use: No  . Sexual activity: Not Currently  Lifestyle  . Physical activity    Days per week: 0 days    Minutes per session: Not on file  . Stress: Not at all  Relationships  . Social connections    Talks on phone: More than three times a week    Gets together: More than three times a week    Attends religious service: More than 4 times per year    Active member of club or organization: Not on file    Attends meetings of clubs or organizations: Not on file    Relationship status: Widowed  . Intimate partner violence    Fear of current or ex partner: No    Emotionally abused: Not on file    Physically abused: No    Forced sexual activity: No  Other Topics Concern  . Not on file  Social History Narrative   Lives with son who  is mentally impaired.     She is temporarily in [facility] for rehab.   Right-handed.   No caffeine use.    Family History  Problem Relation Age of Onset  . Kidney failure Mother   . Stroke Father   . Cancer Brother        lung     Current Outpatient Medications:  .  acetaminophen (TYLENOL) 325 MG tablet, Take 650 mg by mouth every 6 (six) hours as needed for mild pain., Disp: , Rfl:  .  amLODipine (NORVASC) 10 MG tablet, Take 1 tablet (10 mg total) by mouth daily., Disp: 90 tablet, Rfl: 3 .  aspirin 325 MG tablet, Take 325 mg by mouth daily. , Disp: , Rfl:  .  calcium carbonate (OS-CAL) 600 MG TABS tablet, Take 600 mg by mouth daily with breakfast. , Disp: , Rfl:  .  escitalopram (LEXAPRO) 10 MG tablet, Take 1 tablet (10 mg total) by mouth daily., Disp: 90 tablet, Rfl: 3 .  famotidine (PEPCID) 20 MG tablet, Take 1 tablet (  20 mg total) by mouth 2 (two) times daily., Disp: 180 tablet, Rfl: 3 .  hydroxyurea (HYDREA) 500 MG capsule, Take 1 capsule (500 mg total) by mouth daily. May take with food to minimize GI side effects., Disp: 30 capsule, Rfl: 3 .  lisinopril (ZESTRIL) 5 MG tablet, Take 1 tablet (5 mg total) by mouth daily., Disp: 90 tablet, Rfl: 3 .  NUTRITIONAL SUPPLEMENT LIQD, Take 120 mLs by mouth daily. , Disp: , Rfl:  .  senna-docusate (SENNA-S) 8.6-50 MG tablet, TAKE TWO (2) TABLETS BY MOUTH 2 TIMES DAILY FOR CONSTIPATION, Disp: 120 tablet, Rfl: 0 .  vitamin C (ASCORBIC ACID) 500 MG tablet, Take 500 mg by mouth daily. , Disp: , Rfl:  .  allopurinol (ZYLOPRIM) 100 MG tablet, Take 0.5 tablets (50 mg total) by mouth every other day., Disp: 8 tablet, Rfl: 1 No current facility-administered medications for this visit.   Facility-Administered Medications Ordered in Other Visits:  .  acetaminophen (TYLENOL) tablet 650 mg, 650 mg, Oral, Q6H PRN, Sindy Guadeloupe, MD, 650 mg at 12/11/18 1601  Physical exam:  Vitals:   12/11/18 1514  BP: (!) 127/49  Pulse:  64  Resp: 16  Temp: 98.4 F (36.9 C)  TempSrc: Tympanic  Weight: 180 lb 11.2 oz (82 kg)   Physical Exam Constitutional:      Comments: Elderly female sitting in a wheelchair. Appears in no acute distress  HENT:     Head: Normocephalic and atraumatic.  Eyes:     Pupils: Pupils are equal, round, and reactive to light.  Neck:     Musculoskeletal: Normal range of motion.  Cardiovascular:     Rate and Rhythm: Normal rate and regular rhythm.     Heart sounds: Normal heart sounds.  Pulmonary:     Effort: Pulmonary effort is normal.     Breath sounds: Normal breath sounds.  Abdominal:     General: Bowel sounds are normal.     Palpations: Abdomen is soft.  Skin:    General: Skin is warm and dry.  Neurological:     Mental Status: She is alert and oriented to person, place, and time.      CMP Latest Ref Rng & Units 12/11/2018  Glucose 70 - 99 mg/dL 107(H)  BUN 8 - 23 mg/dL 29(H)  Creatinine 0.44 - 1.00 mg/dL 2.04(H)  Sodium 135 - 145 mmol/L 134(L)  Potassium 3.5 - 5.1 mmol/L 4.6  Chloride 98 - 111 mmol/L 103  CO2 22 - 32 mmol/L 23  Calcium 8.9 - 10.3 mg/dL 9.5  Total Protein 6.5 - 8.1 g/dL 8.0  Total Bilirubin 0.3 - 1.2 mg/dL 0.5  Alkaline Phos 38 - 126 U/L 56  AST 15 - 41 U/L 27  ALT 0 - 44 U/L 12   CBC Latest Ref Rng & Units 12/11/2018  WBC 4.0 - 10.5 K/uL 9.2  Hemoglobin 12.0 - 15.0 g/dL 9.0(L)  Hematocrit 36.0 - 46.0 % 28.8(L)  Platelets 150 - 400 K/uL 246      Assessment and plan- Patient is a 85 y.o. female with MDS/MPN unclassified currently on hydrea  Patients wbc has decreased from 34 to 9.2. peripheral blasts on presentation were 5-10% now less than 5%. Platelets are normal. Hemoglobin slowly has drifted down from 10 to 9. Continue to monitor.  She does have CKD and has not seen nephrology yet. I will make a referral to nephrology at this time  Reduce allopurinol dose to 50 mg every other day  chceck cbc cmp and ldh in 1 month and 2 months. See Dr. [name] in  2 months Visit Diagnosis 1. MDS/MPN (myelodysplastic/myeloproliferative neoplasms) ([place])   2. High risk medication use   3. Chronic kidney disease, unspecified CKD stage      Dr. Randa Evens, MD, MPH [facility] at [facility] [phone] 12/13/2018 11:19 AM

## 2018-12-14 ENCOUNTER — Telehealth: Payer: Self-pay | Admitting: *Deleted

## 2018-12-27 ENCOUNTER — Telehealth: Payer: Self-pay | Admitting: *Deleted

## 2018-12-27 NOTE — Telephone Encounter (Signed)
Called the niece [name] who is power of attorney for her aunt about getting an appointment for the patient because of her creatinine which is her kidney function has been getting more elevated as we have been seeing her and Dr. [name] wanted her to go see a kidney doctor nephrology and we did get an appointment for her on October 7 at 1:40 over it [facility].  I left the telephone number and the address to that office and left her my direct number so that she could call me back if she had any questions about this

## 2018-12-28 ENCOUNTER — Ambulatory Visit (INDEPENDENT_AMBULATORY_CARE_PROVIDER_SITE_OTHER): Payer: Medicare Other

## 2018-12-28 DIAGNOSIS — Z23 Encounter for immunization: Secondary | ICD-10-CM | POA: Diagnosis not present

## 2019-01-01 ENCOUNTER — Other Ambulatory Visit: Payer: Self-pay | Admitting: Oncology

## 2019-01-03 ENCOUNTER — Other Ambulatory Visit: Payer: Self-pay | Admitting: Oncology

## 2019-01-03 NOTE — Telephone Encounter (Signed)
CBC with Differential/Platelet Order: [id number] Status:  Final result Visible to patient:  No (not released) Next appt:  01/10/2019 at 02:30 PM in Oncology ([facility]) Dx:  MDS/MPN (myelodysplastic/myeloprolife...  Ref Range & Units 3wk ago 19mo ago 83mo ago  WBC 4.0 - 10.5 K/uL 9.2  17.8High   16.2High    RBC 3.87 - 5.11 MIL/uL 3.16Low   3.37Low   3.74Low    Hemoglobin 12.0 - 15.0 g/dL 9.0Low   9.1Low   9.9Low    HCT 36.0 - 46.0 % 28.8Low   28.9Low   31.4Low    MCV 80.0 - 100.0 fL 91.1  85.8  84.0   MCH 26.0 - 34.0 pg 28.5  27.0  26.5   MCHC 30.0 - 36.0 g/dL 31.3  31.5  31.5   RDW 11.5 - 15.5 % 22.6High   20.4High   20.1High    Platelets 150 - 400 K/uL 246  298  134Low    nRBC 0.0 - 0.2 % 0.2  1.0High   1.7High    Neutrophils Relative % % 62  49  54   Neutro Abs 1.7 - 7.7 K/uL 5.7  8.9High   8.7High    Lymphocytes Relative % 11  12  13    Lymphs Abs 0.7 - 4.0 K/uL 1.0  2.1  2.2   Monocytes Relative % 1  6  7    Monocytes Absolute 0.1 - 1.0 K/uL 0.1  1.1High   1.1High    Eosinophils Relative % 12  6  6    Eosinophils Absolute 0.0 - 0.5 K/uL 1.1High   1.0High   1.0High    Basophils Relative % 9  7  8    Basophils Absolute 0.0 - 0.1 K/uL 0.8High   1.2High   1.3High    WBC Morphology  2% blasts noted on smear. manual diff. see pending pathologist review 8.31.2020  DIFF CONFIRMED BY SMEAR. LEFT SHIFT WITH APPROXIMATELY 5 PERCENT BLASTS NOTED.  MARKED LEFT SHIFT (>5% METAS,MYELOS AND PROS, OCC BLAST NOTED) CM   RBC Morphology  MORPHOLOGY UNREMARKABLE  MIXED RBC POPULATION WITH RARE NRBC NOTED  MIXED RBC MORPHOLOGY WITH FEW POLYCHROMATOPHILIC RBCS NOTED   Smear Review  Normal platelet morphology  Normal platelet morphology CM  Normal platelet morphology CM   Comment: PLATELETS APPEAR ADEQUATE  Reviewed   Metamyelocytes Relative % 1     Myelocytes % 1     Promyelocytes Relative % 1     Blasts % 2     Abs Immature Granulocytes 0.00 - 0.07 K/uL 0.30High   3.55High  CM  1.91High  CM    Comment: Performed at [facility], [address] [place], [place] [address]  Immature Granulocytes   [place]  [facility] CLIN LAB [facility] CLIN LAB [facility] CLIN LAB      Specimen Collected: 12/11/18 14:17 Last Resulted: 12/11/18 15:07

## 2019-01-10 ENCOUNTER — Other Ambulatory Visit: Payer: Self-pay

## 2019-01-10 ENCOUNTER — Inpatient Hospital Stay: Payer: Medicare Other | Attending: Oncology

## 2019-01-10 DIAGNOSIS — D469 Myelodysplastic syndrome, unspecified: Secondary | ICD-10-CM

## 2019-01-10 LAB — COMPREHENSIVE METABOLIC PANEL
ALT: 9 U/L (ref 0–44)
AST: 22 U/L (ref 15–41)
Albumin: 4 g/dL (ref 3.5–5.0)
Alkaline Phosphatase: 52 U/L (ref 38–126)
Anion gap: 8 (ref 5–15)
BUN: 38 mg/dL — ABNORMAL HIGH (ref 8–23)
CO2: 23 mmol/L (ref 22–32)
Calcium: 9.6 mg/dL (ref 8.9–10.3)
Chloride: 106 mmol/L (ref 98–111)
Creatinine, Ser: 2.02 mg/dL — ABNORMAL HIGH (ref 0.44–1.00)
GFR calc Af Amer: 25 mL/min — ABNORMAL LOW (ref >60)
GFR calc non Af Amer: 21 mL/min — ABNORMAL LOW (ref >60)
Glucose, Bld: 122 mg/dL — ABNORMAL HIGH (ref 70–99)
Potassium: 5.1 mmol/L (ref 3.5–5.1)
Sodium: 137 mmol/L (ref 135–145)
Total Bilirubin: 0.5 mg/dL (ref 0.3–1.2)
Total Protein: 7.8 g/dL (ref 6.5–8.1)

## 2019-01-10 LAB — CBC WITH DIFFERENTIAL/PLATELET
Abs Immature Granulocytes: 0.11 10*3/uL — ABNORMAL HIGH (ref 0.00–0.07)
Basophils Absolute: 0.4 10*3/uL — ABNORMAL HIGH (ref 0.0–0.1)
Basophils Relative: 5 %
Eosinophils Absolute: 0.4 10*3/uL (ref 0.0–0.5)
Eosinophils Relative: 6 %
HCT: 29.5 % — ABNORMAL LOW (ref 36.0–46.0)
Hemoglobin: 9.2 g/dL — ABNORMAL LOW (ref 12.0–15.0)
Immature Granulocytes: 2 %
Lymphocytes Relative: 15 %
Lymphs Abs: 1.1 10*3/uL (ref 0.7–4.0)
MCH: 29.9 pg (ref 26.0–34.0)
MCHC: 31.2 g/dL (ref 30.0–36.0)
MCV: 95.8 fL (ref 80.0–100.0)
Monocytes Absolute: 0.2 10*3/uL (ref 0.1–1.0)
Monocytes Relative: 3 %
Neutro Abs: 5 10*3/uL (ref 1.7–7.7)
Neutrophils Relative %: 69 %
Platelets: 192 10*3/uL (ref 150–400)
RBC: 3.08 MIL/uL — ABNORMAL LOW (ref 3.87–5.11)
RDW: 20.6 % — ABNORMAL HIGH (ref 11.5–15.5)
WBC: 7.2 10*3/uL (ref 4.0–10.5)
nRBC: 0 % (ref 0.0–0.2)

## 2019-01-10 LAB — LACTATE DEHYDROGENASE: LDH: 130 U/L (ref 98–192)

## 2019-01-17 DIAGNOSIS — N189 Chronic kidney disease, unspecified: Secondary | ICD-10-CM | POA: Insufficient documentation

## 2019-01-17 DIAGNOSIS — I129 Hypertensive chronic kidney disease with stage 1 through stage 4 chronic kidney disease, or unspecified chronic kidney disease: Secondary | ICD-10-CM | POA: Insufficient documentation

## 2019-01-17 DIAGNOSIS — D631 Anemia in chronic kidney disease: Secondary | ICD-10-CM | POA: Insufficient documentation

## 2019-01-17 DIAGNOSIS — N184 Chronic kidney disease, stage 4 (severe): Secondary | ICD-10-CM | POA: Insufficient documentation

## 2019-01-24 ENCOUNTER — Other Ambulatory Visit: Payer: Self-pay | Admitting: Nephrology

## 2019-01-24 DIAGNOSIS — N184 Chronic kidney disease, stage 4 (severe): Secondary | ICD-10-CM

## 2019-01-24 DIAGNOSIS — N178 Other acute kidney failure: Secondary | ICD-10-CM

## 2019-01-29 ENCOUNTER — Other Ambulatory Visit: Payer: Self-pay | Admitting: Oncology

## 2019-01-30 ENCOUNTER — Ambulatory Visit
Admission: RE | Admit: 2019-01-30 | Discharge: 2019-01-30 | Disposition: A | Discharge status: Home / Self Care | Payer: Medicare Other | Source: Ambulatory Visit | Attending: Nephrology | Admitting: Nephrology

## 2019-01-30 ENCOUNTER — Other Ambulatory Visit: Payer: Self-pay

## 2019-01-30 DIAGNOSIS — N184 Chronic kidney disease, stage 4 (severe): Secondary | ICD-10-CM | POA: Insufficient documentation

## 2019-01-30 DIAGNOSIS — N178 Other acute kidney failure: Secondary | ICD-10-CM | POA: Diagnosis not present

## 2019-01-31 ENCOUNTER — Other Ambulatory Visit: Payer: Self-pay | Admitting: Nephrology

## 2019-02-09 ENCOUNTER — Other Ambulatory Visit: Payer: Self-pay

## 2019-02-09 ENCOUNTER — Inpatient Hospital Stay (HOSPITAL_BASED_OUTPATIENT_CLINIC_OR_DEPARTMENT_OTHER): Payer: Medicare Other | Admitting: Oncology

## 2019-02-09 ENCOUNTER — Inpatient Hospital Stay: Payer: Medicare Other | Attending: Oncology

## 2019-02-09 ENCOUNTER — Encounter: Payer: Self-pay | Admitting: Oncology

## 2019-02-09 VITALS — BP 142/47 | HR 78 | Temp 97.4°F | Ht 70.5 in | Wt 180.0 lb

## 2019-02-09 DIAGNOSIS — E119 Type 2 diabetes mellitus without complications: Secondary | ICD-10-CM | POA: Diagnosis not present

## 2019-02-09 DIAGNOSIS — N182 Chronic kidney disease, stage 2 (mild): Secondary | ICD-10-CM | POA: Insufficient documentation

## 2019-02-09 DIAGNOSIS — D469 Myelodysplastic syndrome, unspecified: Secondary | ICD-10-CM | POA: Insufficient documentation

## 2019-02-09 DIAGNOSIS — Z87891 Personal history of nicotine dependence: Secondary | ICD-10-CM | POA: Diagnosis not present

## 2019-02-09 DIAGNOSIS — Z7982 Long term (current) use of aspirin: Secondary | ICD-10-CM | POA: Diagnosis not present

## 2019-02-09 DIAGNOSIS — I10 Essential (primary) hypertension: Secondary | ICD-10-CM | POA: Insufficient documentation

## 2019-02-09 DIAGNOSIS — Z79899 Other long term (current) drug therapy: Secondary | ICD-10-CM | POA: Diagnosis not present

## 2019-02-09 DIAGNOSIS — E785 Hyperlipidemia, unspecified: Secondary | ICD-10-CM | POA: Diagnosis not present

## 2019-02-09 DIAGNOSIS — C946 Myelodysplastic disease, not classified: Secondary | ICD-10-CM

## 2019-02-09 DIAGNOSIS — Z801 Family history of malignant neoplasm of trachea, bronchus and lung: Secondary | ICD-10-CM | POA: Diagnosis not present

## 2019-02-09 LAB — COMPREHENSIVE METABOLIC PANEL
ALT: 11 U/L (ref 0–44)
AST: 23 U/L (ref 15–41)
Albumin: 4 g/dL (ref 3.5–5.0)
Alkaline Phosphatase: 56 U/L (ref 38–126)
Anion gap: 11 (ref 5–15)
BUN: 33 mg/dL — ABNORMAL HIGH (ref 8–23)
CO2: 21 mmol/L — ABNORMAL LOW (ref 22–32)
Calcium: 9.5 mg/dL (ref 8.9–10.3)
Chloride: 105 mmol/L (ref 98–111)
Creatinine, Ser: 1.77 mg/dL — ABNORMAL HIGH (ref 0.44–1.00)
GFR calc Af Amer: 29 mL/min — ABNORMAL LOW (ref >60)
GFR calc non Af Amer: 25 mL/min — ABNORMAL LOW (ref >60)
Glucose, Bld: 119 mg/dL — ABNORMAL HIGH (ref 70–99)
Potassium: 4.3 mmol/L (ref 3.5–5.1)
Sodium: 137 mmol/L (ref 135–145)
Total Bilirubin: 0.4 mg/dL (ref 0.3–1.2)
Total Protein: 7.7 g/dL (ref 6.5–8.1)

## 2019-02-09 LAB — CBC WITH DIFFERENTIAL/PLATELET
Abs Immature Granulocytes: 0.04 10*3/uL (ref 0.00–0.07)
Basophils Absolute: 0.3 10*3/uL — ABNORMAL HIGH (ref 0.0–0.1)
Basophils Relative: 4 %
Eosinophils Absolute: 0.4 10*3/uL (ref 0.0–0.5)
Eosinophils Relative: 6 %
HCT: 29.7 % — ABNORMAL LOW (ref 36.0–46.0)
Hemoglobin: 9.5 g/dL — ABNORMAL LOW (ref 12.0–15.0)
Immature Granulocytes: 1 %
Lymphocytes Relative: 16 %
Lymphs Abs: 1.2 10*3/uL (ref 0.7–4.0)
MCH: 31 pg (ref 26.0–34.0)
MCHC: 32 g/dL (ref 30.0–36.0)
MCV: 97.1 fL (ref 80.0–100.0)
Monocytes Absolute: 0.2 10*3/uL (ref 0.1–1.0)
Monocytes Relative: 3 %
Neutro Abs: 5.2 10*3/uL (ref 1.7–7.7)
Neutrophils Relative %: 70 %
Platelets: 277 10*3/uL (ref 150–400)
RBC: 3.06 MIL/uL — ABNORMAL LOW (ref 3.87–5.11)
RDW: 16.6 % — ABNORMAL HIGH (ref 11.5–15.5)
Smear Review: NORMAL
WBC: 7.3 10*3/uL (ref 4.0–10.5)
nRBC: 0 % (ref 0.0–0.2)

## 2019-02-09 LAB — LACTATE DEHYDROGENASE: LDH: 126 U/L (ref 98–192)

## 2019-02-09 LAB — PATHOLOGIST SMEAR REVIEW

## 2019-02-09 NOTE — Progress Notes (Signed)
Patient stated that she had been doing well. 

## 2019-02-12 NOTE — Progress Notes (Signed)
Hematology/Oncology Consult note [facility]  Telephone:([phone] [phone]  Patient Care Team: Glori Bickers, Wynelle Fanny, MD as PCP - General (Family Medicine)   Name of the patient: Erin [name]  [id number]  1928/12/31   Date of visit: 02/12/19  Diagnosis- MDS/ MPN unclassified   Chief complaint/ Reason for visit-routine follow-up of MDS/MPN on Hydrea  Heme/Onc history: Patient is a 85 year old female with a past medical history significant for hypertension, retinal artery occlusion, colon cancer, osteopenia and stage II CKD amongother medical problems.She has been referred to [place] for evaluation of leukocytosis. CBC with differential on 08/31/2018 showed white count of 34.1, H&H of 10.6/32.6 and a platelet count of 313. Differential predominantly showed neutrophilia and basophilia with relative lymphopenia and mono cytopenia. Pathology smear review showed leukoerythroblastic blood cells with immature granulocytic cells, nucleated RBCs and abnormal RBC morphology. Primary hematologic disorder not excluded. Prior to this her CBC from September 2019 showed a normal white count of 6.3.  Results of blood work from 09/06/2018 were as follows: CBC showed white count of 34, H&H of 10.6/33.8 and a platelet count of 338. Absolute neutrophil count was elevated at 18. Nucleated RBCs were seen. Smear review showed absolute leukocytosis with neutrophilia and left shifted maturation. Circulating blasts present estimated at 5 to 10%. Basophilia. Patient had a bone marrow biopsy which was also reviewed at [facility]. It showed overall features including peripheral leukocytosis with left shifted neutrophilia and basophilia and bone marrow hypercellularity including granulocytic hyperplasia and mild morphologic dysplasia are not entirely specific but are consistent with a myeloid neoplasm. The absence of 9:22 by outside [facility] and molecular studies excludes CML. The findings are  most suggestive of MDS/MPN and diagnostic considerations include atypical CML or MDS/MPN unclassifiable.Bone marrow blasts 8%. Peripheral blood Jak 2 and BCR ABL testing was negative.Cytogenetics was normal. New genomic panel showed a SX L1, SF 3 B1, SRS F2 mutations  Interval history-patient is here with her caregiver.  She reports doing well overall and no significant health issues since the last visit.  She is tolerating Hydrea well without any significant side effects.  Denies any mouth ulcers leg edema.  No recent falls  ECOG PS- 2 Pain scale- 0   Review of systems- Review of Systems  Constitutional: Positive for malaise/fatigue. Negative for chills, fever and weight loss.  HENT: Negative for congestion, ear discharge and nosebleeds.   Eyes: Negative for blurred vision.  Respiratory: Negative for cough, hemoptysis, sputum production, shortness of breath and wheezing.   Cardiovascular: Negative for chest pain, palpitations, orthopnea and claudication.  Gastrointestinal: Negative for abdominal pain, blood in stool, constipation, diarrhea, heartburn, melena, nausea and vomiting.  Genitourinary: Negative for dysuria, flank pain, frequency, hematuria and urgency.  Musculoskeletal: Negative for back pain, joint pain and myalgias.  Skin: Negative for rash.  Neurological: Negative for dizziness, tingling, focal weakness, seizures, weakness and headaches.  Endo/Heme/Allergies: Does not bruise/bleed easily.  Psychiatric/Behavioral: Negative for depression and suicidal ideas. The patient does not have insomnia.       Allergies  Allergen Reactions   Fluoxetine Hcl Nausea And Vomiting   Hydrocodone Nausea And Vomiting   Shellfish Allergy Nausea And Vomiting    Makes the patient feel "sick"   Tape Other (See Comments)    Tears and bruises; please use Coban wrap   Diclofenac Sodium Nausea And Vomiting   Latex Rash     Past Medical History:  Diagnosis Date   Diabetes mellitus  borderline   Encephalopathy    Hyperlipidemia    Hypertension    Memory loss    Obesity    Osteoarthritis    Osteoporosis    Sacroiliac joint dysfunction 04/01/2012   Upper GI bleed    AV malformation/when anticoag     Past Surgical History:  Procedure Laterality Date   APPENDECTOMY     cervicitis  1964   conization    CHOLECYSTECTOMY     COLON RESECTION  1988   secondary to cancer   endometrial polyp  05/2000   hyperplasia-laser treatment   JOINT REPLACEMENT     Rt knee replacement   POLYPECTOMY  1998   benign X 2   TOTAL KNEE ARTHROPLASTY  07/2009   right     Social History   Socioeconomic History   Marital status: Widowed    Spouse name: Not on file   Number of children: 1   Years of education: 2 yrs coll   Highest education level: Not on file  Occupational History   Occupation: Retired  Scientist, product/process development strain: Not on file   Food insecurity    Worry: Never true    Inability: Never true   Transportation needs    Medical: No    Non-medical: No  Tobacco Use   Smoking status: Former Smoker    Types: Cigarettes    Quit date: 08/14/1962    Years since quitting: 56.5   Smokeless tobacco: Never Used   Tobacco comment: Quit over 10 years ago  Substance and Sexual Activity   Alcohol use: No    Alcohol/week: 0.0 standard drinks   Drug use: No   Sexual activity: Not Currently  Lifestyle   Physical activity    Days per week: 0 days    Minutes per session: Not on file   Stress: Not at all  Relationships   Social connections    Talks on phone: More than three times a week    Gets together: More than three times a week    Attends religious service: More than 4 times per year    Active member of club or organization: Not on file    Attends meetings of clubs or organizations: Not on file    Relationship status: Widowed   Intimate partner violence    Fear of current or ex partner: No    Emotionally  abused: Not on file    Physically abused: No    Forced sexual activity: No  Other Topics Concern   Not on file  Social History Narrative   Lives with son who is mentally impaired.     She is temporarily in [facility] for rehab.   Right-handed.   No caffeine use.    Family History  Problem Relation Age of Onset   Kidney failure Mother    Stroke Father    Cancer Brother        lung     Current Outpatient Medications:    acetaminophen (TYLENOL) 325 MG tablet, Take 650 mg by mouth every 6 (six) hours as needed for mild pain., Disp: , Rfl:    allopurinol (ZYLOPRIM) 100 MG tablet, TAKE 0.5 TABLETS (50 MG TOTAL) BY MOUTH EVERY OTHER DAY., Disp: 8 tablet, Rfl: 1   amLODipine (NORVASC) 10 MG tablet, Take 1 tablet (10 mg total) by mouth daily., Disp: 90 tablet, Rfl: 3   aspirin 325 MG tablet, Take 325 mg by mouth daily. , Disp: , Rfl:  calcium carbonate (OS-CAL) 600 MG TABS tablet, Take 600 mg by mouth daily with breakfast. , Disp: , Rfl:    escitalopram (LEXAPRO) 10 MG tablet, Take 1 tablet (10 mg total) by mouth daily., Disp: 90 tablet, Rfl: 3   famotidine (PEPCID) 20 MG tablet, Take 1 tablet (20 mg total) by mouth 2 (two) times daily., Disp: 180 tablet, Rfl: 3   lisinopril (ZESTRIL) 5 MG tablet, Take 1 tablet (5 mg total) by mouth daily., Disp: 90 tablet, Rfl: 3   NUTRITIONAL SUPPLEMENT LIQD, Take 120 mLs by mouth daily. , Disp: , Rfl:    senna-docusate (SENNA-S) 8.6-50 MG tablet, TAKE TWO (2) TABLETS BY MOUTH 2 TIMES DAILY FOR CONSTIPATION, Disp: 120 tablet, Rfl: 0   vitamin C (ASCORBIC ACID) 500 MG tablet, Take 500 mg by mouth daily. , Disp: , Rfl:  No current facility-administered medications for this visit.   Facility-Administered Medications Ordered in Other Visits:    acetaminophen (TYLENOL) tablet 650 mg, 650 mg, Oral, Q6H PRN, Sindy Guadeloupe, MD, 650 mg at 12/11/18 1601  Physical exam:  Vitals:   02/09/19 1513  BP: (!) 142/47    Pulse: 78  Temp: (!) 97.4 F (36.3 C)  TempSrc: Tympanic  Weight: 180 lb (81.6 kg)  Height: 5' 10.5" (1.791 m)   Physical Exam Constitutional:      Comments: Frail elderly female sitting in a wheelchair.  Appears in no acute distress  HENT:     Head: Normocephalic and atraumatic.  Eyes:     Pupils: Pupils are equal, round, and reactive to light.  Neck:     Musculoskeletal: Normal range of motion.  Cardiovascular:     Rate and Rhythm: Normal rate and regular rhythm.     Heart sounds: Normal heart sounds.  Pulmonary:     Effort: Pulmonary effort is normal.     Breath sounds: Normal breath sounds.  Abdominal:     General: Bowel sounds are normal.     Palpations: Abdomen is soft.  Skin:    General: Skin is warm and dry.  Neurological:     Mental Status: She is alert and oriented to person, place, and time.      CMP Latest Ref Rng & Units 02/09/2019  Glucose 70 - 99 mg/dL 119(H)  BUN 8 - 23 mg/dL 33(H)  Creatinine 0.44 - 1.00 mg/dL 1.77(H)  Sodium 135 - 145 mmol/L 137  Potassium 3.5 - 5.1 mmol/L 4.3  Chloride 98 - 111 mmol/L 105  CO2 22 - 32 mmol/L 21(L)  Calcium 8.9 - 10.3 mg/dL 9.5  Total Protein 6.5 - 8.1 g/dL 7.7  Total Bilirubin 0.3 - 1.2 mg/dL 0.4  Alkaline Phos 38 - 126 U/L 56  AST 15 - 41 U/L 23  ALT 0 - 44 U/L 11   CBC Latest Ref Rng & Units 02/09/2019  WBC 4.0 - 10.5 K/uL 7.3  Hemoglobin 12.0 - 15.0 g/dL 9.5(L)  Hematocrit 36.0 - 46.0 % 29.7(L)  Platelets 150 - 400 K/uL 277    No images are attached to the encounter.  US Renal  Result Date: 01/31/2019 CLINICAL DATA:  Initial evaluation for acute renal failure. EXAM: RENAL / URINARY TRACT ULTRASOUND COMPLETE COMPARISON:  Prior CT from 01/04/2014. FINDINGS: Right Kidney: Renal measurements: 9.3 x 4.2 x 4.0 cm = volume: 82.8 mL. Mild cortical thinning with diffusely increased echogenicity within the renal parenchyma. No nephrolithiasis or hydronephrosis. No focal renal mass. Left Kidney: Renal  measurements: 9.2 x 4.1 x 4.0 cm =  volume: 79.9 mL. Mild cortical thinning with diffusely increased echogenicity within the renal parenchyma. No nephrolithiasis or hydronephrosis. No focal renal mass. Bladder: Appears normal for degree of bladder distention. Other: None. IMPRESSION: 1. Mild diffuse cortical thinning with diffusely increased echogenicity within the renal parenchyma, compatible with chronic medical renal disease. 2. No hydronephrosis. Electronically Signed   By: Jeannine Boga M.D.   On: 01/31/2019 05:43     Assessment and plan- Patient is a 85 y.o. female with MDS/MPN unclassified currently on Hydrea and here for routine follow-up  Since starting Hydrea patient's white count has normalized.  She has rare blasts on her peripheral smear and on presentation they were close to 5 to 10%.    Hemoglobin has remained stable between 9-10.  Continue to monitor she does have baseline CKD and that may be contributing as well but given her underlying MPN I will hold off on giving any Procrit at this time  I will see her back in 2 months with labs   Visit Diagnosis 1. High risk medication use   2. MDS/MPN (myelodysplastic/myeloproliferative neoplasms) ([place])      Dr. Randa Evens, MD, MPH [facility] [phone] 02/12/2019 1:49 PM

## 2019-03-01 ENCOUNTER — Other Ambulatory Visit: Payer: Self-pay | Admitting: Oncology

## 2019-03-02 ENCOUNTER — Other Ambulatory Visit: Payer: Self-pay | Admitting: Oncology

## 2019-03-22 ENCOUNTER — Other Ambulatory Visit: Payer: Self-pay | Admitting: Oncology

## 2019-04-09 ENCOUNTER — Other Ambulatory Visit: Payer: Medicare Other

## 2019-04-10 ENCOUNTER — Inpatient Hospital Stay: Payer: Medicare Other | Admitting: Oncology

## 2019-04-10 ENCOUNTER — Inpatient Hospital Stay: Payer: Medicare Other

## 2019-04-19 ENCOUNTER — Other Ambulatory Visit: Payer: Self-pay | Admitting: Oncology

## 2019-04-20 ENCOUNTER — Other Ambulatory Visit: Payer: Self-pay

## 2019-04-23 ENCOUNTER — Inpatient Hospital Stay: Payer: Medicare PPO | Attending: Oncology

## 2019-04-23 ENCOUNTER — Other Ambulatory Visit: Payer: Self-pay

## 2019-04-23 DIAGNOSIS — D469 Myelodysplastic syndrome, unspecified: Secondary | ICD-10-CM | POA: Insufficient documentation

## 2019-04-23 LAB — CBC WITH DIFFERENTIAL/PLATELET
Abs Immature Granulocytes: 0.02 10*3/uL (ref 0.00–0.07)
Basophils Absolute: 0.2 10*3/uL — ABNORMAL HIGH (ref 0.0–0.1)
Basophils Relative: 3 %
Eosinophils Absolute: 0.2 10*3/uL (ref 0.0–0.5)
Eosinophils Relative: 4 %
HCT: 30 % — ABNORMAL LOW (ref 36.0–46.0)
Hemoglobin: 9.1 g/dL — ABNORMAL LOW (ref 12.0–15.0)
Immature Granulocytes: 0 %
Lymphocytes Relative: 21 %
Lymphs Abs: 1.2 10*3/uL (ref 0.7–4.0)
MCH: 29.5 pg (ref 26.0–34.0)
MCHC: 30.3 g/dL (ref 30.0–36.0)
MCV: 97.4 fL (ref 80.0–100.0)
Monocytes Absolute: 0.2 10*3/uL (ref 0.1–1.0)
Monocytes Relative: 3 %
Neutro Abs: 3.9 10*3/uL (ref 1.7–7.7)
Neutrophils Relative %: 69 %
Platelets: 312 10*3/uL (ref 150–400)
RBC: 3.08 MIL/uL — ABNORMAL LOW (ref 3.87–5.11)
RDW: 15.1 % (ref 11.5–15.5)
WBC: 5.7 10*3/uL (ref 4.0–10.5)
nRBC: 0 % (ref 0.0–0.2)

## 2019-04-23 LAB — COMPREHENSIVE METABOLIC PANEL
ALT: 9 U/L (ref 0–44)
AST: 23 U/L (ref 15–41)
Albumin: 3.9 g/dL (ref 3.5–5.0)
Alkaline Phosphatase: 57 U/L (ref 38–126)
Anion gap: 9 (ref 5–15)
BUN: 33 mg/dL — ABNORMAL HIGH (ref 8–23)
CO2: 21 mmol/L — ABNORMAL LOW (ref 22–32)
Calcium: 9.6 mg/dL (ref 8.9–10.3)
Chloride: 103 mmol/L (ref 98–111)
Creatinine, Ser: 1.79 mg/dL — ABNORMAL HIGH (ref 0.44–1.00)
GFR calc Af Amer: 29 mL/min — ABNORMAL LOW (ref >60)
GFR calc non Af Amer: 25 mL/min — ABNORMAL LOW (ref >60)
Glucose, Bld: 103 mg/dL — ABNORMAL HIGH (ref 70–99)
Potassium: 4.2 mmol/L (ref 3.5–5.1)
Sodium: 133 mmol/L — ABNORMAL LOW (ref 135–145)
Total Bilirubin: 0.6 mg/dL (ref 0.3–1.2)
Total Protein: 7.9 g/dL (ref 6.5–8.1)

## 2019-04-24 ENCOUNTER — Inpatient Hospital Stay (HOSPITAL_BASED_OUTPATIENT_CLINIC_OR_DEPARTMENT_OTHER): Payer: Medicare PPO | Admitting: Oncology

## 2019-04-24 DIAGNOSIS — D469 Myelodysplastic syndrome, unspecified: Secondary | ICD-10-CM | POA: Diagnosis not present

## 2019-04-24 DIAGNOSIS — Z79899 Other long term (current) drug therapy: Secondary | ICD-10-CM

## 2019-04-24 DIAGNOSIS — C946 Myelodysplastic disease, not classified: Secondary | ICD-10-CM

## 2019-04-24 NOTE — Progress Notes (Signed)
Niece wants to know about covid vaccine. She is still on hydrea. No c/o

## 2019-04-26 DIAGNOSIS — Z79899 Other long term (current) drug therapy: Secondary | ICD-10-CM | POA: Insufficient documentation

## 2019-04-26 DIAGNOSIS — C946 Myelodysplastic disease, not classified: Secondary | ICD-10-CM | POA: Insufficient documentation

## 2019-04-26 DIAGNOSIS — D469 Myelodysplastic syndrome, unspecified: Secondary | ICD-10-CM | POA: Insufficient documentation

## 2019-04-26 NOTE — Progress Notes (Signed)
I connected with Erin [name] on 04/26/19 at  1:00 PM EST by video enabled telemedicine visit and verified that I am speaking with the correct person using two identifiers.   I discussed the limitations, risks, security and privacy concerns of performing an evaluation and management service by telemedicine and the availability of in-person appointments. I also discussed with the patient that there may be a patient responsible charge related to this service. The patient expressed understanding and agreed to proceed.  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 CBC with differential on 08/31/2018 showed white count of 34.1, H&H of 10.6/32.6 and a platelet count of 313. Differential predominantly showed neutrophilia and basophilia with relative lymphopenia and mono cytopenia. Pathology smear review showed leukoerythroblastic blood cells with immature granulocytic cells, nucleated RBCs and abnormal RBC morphology. Primary hematologic disorder not excluded. Prior to this her CBC from September 2019 showed a normal white count of 6.3.  Results of blood work from 09/06/2018 were as follows: CBC showed white count of 34, H&H of 10.6/33.8 and a platelet count of 338. Absolute neutrophil count was elevated at 18. Nucleated RBCs were seen. Smear review showed absolute leukocytosis with neutrophilia and left shifted maturation. Circulating blasts present estimated at 5 to 10%. Basophilia. Patient had a bone marrow biopsy which was also reviewed at [facility]. It showed overall features  including peripheral leukocytosis with left shifted neutrophilia and basophilia and bone marrow hypercellularity including granulocytic hyperplasia and mild morphologic dysplasia are not entirely specific but are consistent with a myeloid neoplasm. The absence of 9:22 by outside [facility] and molecular studies excludes CML. The findings are most suggestive of MDS/MPN and diagnostic considerations include atypical CML or MDS/MPN unclassifiable.Bone marrow blasts 8%. Peripheral blood Jak 2 and BCR ABL testing was negative.Cytogenetics was normal. New genomic panel showed a SX L1, SF 3 B1, SRS F2 mutations    Interval history she continues to tolerate Hydrea well without any significant side effects.  She is reporting ongoing fatigue but denies any skin rash diarrhea or mouth ulcers.   Review of Systems  Constitutional: Positive for malaise/fatigue. Negative for chills, fever and weight loss.  HENT: Negative for congestion, ear discharge and nosebleeds.   Eyes: Negative for blurred vision.  Respiratory: Negative for cough, hemoptysis, sputum production, shortness of breath and wheezing.   Cardiovascular: Negative for chest pain, palpitations, orthopnea and claudication.  Gastrointestinal: Negative for abdominal pain, blood in stool, constipation, diarrhea, heartburn, melena, nausea and vomiting.  Genitourinary: Negative for dysuria, flank pain, frequency, hematuria and urgency.  Musculoskeletal: Negative for back pain, joint pain and myalgias.  Skin: Negative for rash.  Neurological: Negative for dizziness, tingling, focal weakness, seizures, weakness and headaches.  Endo/Heme/Allergies: Does not bruise/bleed easily.  Psychiatric/Behavioral: Negative for depression and suicidal ideas. The patient does not have insomnia.     Allergies  Allergen Reactions  . Fluoxetine Hcl Nausea And Vomiting  . Hydrocodone Nausea And Vomiting  . Shellfish Allergy Nausea And Vomiting    Makes the patient  feel "sick"  . Tape Other (See Comments)    Tears and bruises; please use Coban wrap  .  Diclofenac Sodium Nausea And Vomiting  . Latex Rash    Past Medical History:  Diagnosis Date  . Diabetes mellitus    borderline  . Encephalopathy   . Hyperlipidemia   . Hypertension   . Memory loss   . Obesity   . Osteoarthritis   . Osteoporosis   . Sacroiliac joint dysfunction 04/01/2012  . 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 APPENDECTOMY    . cervicitis  1964   conization   . CHOLECYSTECTOMY    . COLON RESECTION  1988   secondary to cancer  . endometrial polyp  05/2000   hyperplasia-laser treatment  . JOINT REPLACEMENT     Rt knee replacement  . POLYPECTOMY  1998   benign X 2  . TOTAL KNEE ARTHROPLASTY  07/2009   right     Social History   Socioeconomic History  . Marital status: Widowed    Spouse name: Not on file  . Number of children: 1  . Years of education: 2 yrs coll  . Highest education level: Not on file  Occupational History  . Occupation: Retired  Tobacco Use  . Smoking status: Former Smoker    Types: Cigarettes    Quit date: 08/14/1962    Years since quitting: 56.7  . Smokeless tobacco: Never Used  . Tobacco comment: Quit over 10 years ago  Substance and Sexual Activity  . Alcohol use: No    Alcohol/week: 0.0 standard drinks  . Drug use: No  . Sexual activity: Not Currently  Other Topics Concern  . Not on file  Social History Narrative   Lives with son who is mentally impaired.     She is temporarily in [facility] for rehab.   Right-handed.   No caffeine use.   Social Determinants of Health   Financial Resource Strain:   . Difficulty of Paying Living Expenses: Not on file  Food Insecurity: No Food Insecurity  . Worried About Charity fundraiser in the Last Year: Never true  . Ran Out of Food in the Last Year: Never true  Transportation Needs: No  Transportation Needs  . Lack of Transportation (Medical): No  . Lack of Transportation (Non-Medical): No  Physical Activity: Unknown  . Days of Exercise per Week: 0 days  . Minutes of Exercise per Session: Not on file  Stress: No Stress Concern Present  . Feeling of Stress : Not at all  Social Connections: Unknown  . Frequency of Communication with Friends and Family: More than three times a week  . Frequency of Social Gatherings with Friends and Family: More than three times a week  . Attends Religious Services: More than 4 times per year  . Active Member of Clubs or Organizations: Not on file  . Attends Archivist Meetings: Not on file  . Marital Status: Widowed  Intimate Partner Violence: Unknown  . Fear of Current or Ex-Partner: No  . Emotionally Abused: Not on file  . Physically Abused: No  . Sexually Abused: No    Family History  Problem Relation Age of Onset  . Kidney failure Mother   . Stroke Father   . Cancer Brother        lung     Current Outpatient Medications:  .  acetaminophen (TYLENOL) 325 MG tablet, Take 650 mg by mouth every 6 (six) hours as needed for mild pain., Disp: , Rfl:  .  allopurinol (ZYLOPRIM) 100 MG tablet, TAKE 0.5 TABLETS (50 MG TOTAL) BY MOUTH EVERY OTHER DAY., Disp: 8 tablet, Rfl: 1 .  amLODipine (NORVASC) 10 MG tablet, Take 1 tablet (10 mg total) by mouth daily., Disp: 90 tablet, Rfl: 3 .  aspirin 325 MG tablet, Take 325 mg by mouth daily. , Disp: , Rfl:  .  calcium carbonate (OS-CAL) 600 MG TABS tablet, Take 600 mg by mouth daily with breakfast. , Disp: , Rfl:  .  escitalopram (LEXAPRO) 10 MG tablet, Take 1 tablet (10 mg total) by mouth daily., Disp: 90 tablet, Rfl: 3 .  famotidine (PEPCID) 20 MG tablet, Take 1 tablet (20 mg total) by mouth 2 (two) times daily., Disp: 180 tablet, Rfl: 3 .  lisinopril (ZESTRIL) 5 MG tablet, Take 1 tablet (5 mg total) by mouth daily., Disp: 90 tablet, Rfl: 3 .  NUTRITIONAL SUPPLEMENT LIQD, Take 120 mLs  by mouth daily. , Disp: , Rfl:  .  polyethylene glycol (MIRALAX / GLYCOLAX) 17 g packet, Take 17 g by mouth daily as needed., Disp: , Rfl:  .  senna-docusate (SENNA-S) 8.6-50 MG tablet, TAKE TWO (2) TABLETS BY MOUTH 2 TIMES DAILY FOR CONSTIPATION, Disp: 120 tablet, Rfl: 0 .  vitamin C (ASCORBIC ACID) 500 MG tablet, Take 500 mg by mouth daily. , Disp: , Rfl:  No current facility-administered medications for this visit.  Facility-Administered Medications Ordered in Other Visits:  .  acetaminophen (TYLENOL) tablet 650 mg, 650 mg, Oral, Q6H PRN, Sindy Guadeloupe, MD, 650 mg at 12/11/18 1601  No results found.  No images are attached to the encounter.   CMP Latest Ref Rng & Units 04/23/2019  Glucose 70 - 99 mg/dL 103(H)  BUN 8 - 23 mg/dL 33(H)  Creatinine 0.44 - 1.00 mg/dL 1.79(H)  Sodium 135 - 145 mmol/L 133(L)  Potassium 3.5 - 5.1 mmol/L 4.2  Chloride 98 - 111 mmol/L 103  CO2 22 - 32 mmol/L 21(L)  Calcium 8.9 - 10.3 mg/dL 9.6  Total Protein 6.5 - 8.1 g/dL 7.9  Total Bilirubin 0.3 - 1.2 mg/dL 0.6  Alkaline Phos 38 - 126 U/L 57  AST 15 - 41 U/L 23  ALT 0 - 44 U/L 9   CBC Latest Ref Rng & Units 04/23/2019  WBC 4.0 - 10.5 K/uL 5.7  Hemoglobin 12.0 - 15.0 g/dL 9.1(L)  Hematocrit 36.0 - 46.0 % 30.0(L)  Platelets 150 - 400 K/uL 312     Observation/objective: Appears in no acute distress on video visit today.  Breathing is nonlabored  Assessment and plan: Patient is a 86 year old female with MDS/MPN unclassified currently on Hydrea  After starting Hydrea patient's white cell count has normalized.  Her hemoglobin is slowly drifting down to 9 which I will continue to monitor.  She does have an underlying component of CKD as well.  However given her underlying MDS/MPN component with circulating blasts I will hold off on giving her Procrit at this time.  Follow-up instructions: I will see her back in 3 months with CBC with differential, CMP, pathological smear review, ferritin and iron  studies B12 and folate followed by video visit  I discussed the assessment and treatment plan with the patient. The patient was provided an opportunity to ask questions and all were answered. The patient agreed with the plan and demonstrated an understanding of the instructions.   The patient was advised to call back or seek an in-person evaluation if the symptoms worsen or if the condition fails to improve  as anticipated.    Visit Diagnosis: 1. High risk medication use   2. MDS/MPN (myelodysplastic/myeloproliferative neoplasms) ([place])     Dr. Randa Evens, MD, MPH [facility] Tel- [phone] 04/26/2019 1:44 PM

## 2019-05-01 ENCOUNTER — Other Ambulatory Visit: Payer: Self-pay | Admitting: Oncology

## 2019-05-18 ENCOUNTER — Encounter: Payer: Self-pay | Admitting: Oncology

## 2019-05-22 DIAGNOSIS — I129 Hypertensive chronic kidney disease with stage 1 through stage 4 chronic kidney disease, or unspecified chronic kidney disease: Secondary | ICD-10-CM | POA: Diagnosis not present

## 2019-05-22 DIAGNOSIS — N179 Acute kidney failure, unspecified: Secondary | ICD-10-CM | POA: Diagnosis not present

## 2019-05-22 DIAGNOSIS — D631 Anemia in chronic kidney disease: Secondary | ICD-10-CM | POA: Diagnosis not present

## 2019-05-22 DIAGNOSIS — N184 Chronic kidney disease, stage 4 (severe): Secondary | ICD-10-CM | POA: Diagnosis not present

## 2019-05-30 ENCOUNTER — Encounter: Payer: Self-pay | Admitting: Oncology

## 2019-05-31 ENCOUNTER — Telehealth: Payer: Self-pay

## 2019-05-31 DIAGNOSIS — R110 Nausea: Secondary | ICD-10-CM

## 2019-05-31 MED ORDER — PROCHLORPERAZINE MALEATE 10 MG PO TABS: 10.0000 mg | ORAL_TABLET | Freq: Four times a day (QID) | ORAL | 0 refills | Status: DC | PRN

## 2019-05-31 NOTE — Telephone Encounter (Signed)
Reubin Milan and asked how Mrs. Erin [name] was feeling. [name] stated that her aunt had been complaining about feeling nauseated and with abdominal cramps everyday. [name] stated that she tells her aunt that it could be because she is not eating and all she is doing is sleep. [name] stated that she gives her Boosts TID and that she drinks them but doesn't want to eat food. I told [name] that Dr. [name] recommended for her aunt to take Compazine q6h PRN. [name] stated that she would give her compazine and see how that helps with the nausea and if it doesn't help, she will call us back. I told her that it would be great to keep [place] updated.

## 2019-06-05 ENCOUNTER — Telehealth: Payer: Self-pay | Admitting: Family Medicine

## 2019-06-05 ENCOUNTER — Encounter: Payer: Self-pay | Admitting: Oncology

## 2019-06-05 NOTE — Telephone Encounter (Signed)
A one year f/u for end of may or early June is ok (she is currently seeing nephrology) unless she wants to be seen sooner for another reason Can schedule it as a PE

## 2019-06-05 NOTE — Telephone Encounter (Signed)
Patient's Cousin called today to check on patient's appt. She thought the patient was due for a follow up but wanted to check when you would like to see the patient again

## 2019-06-05 NOTE — Telephone Encounter (Signed)
I spoke to [place] and she scheduled appointments the beginning of June.

## 2019-06-06 ENCOUNTER — Encounter: Payer: Self-pay | Admitting: Family Medicine

## 2019-06-07 ENCOUNTER — Telehealth: Payer: Self-pay | Admitting: Family Medicine

## 2019-06-07 ENCOUNTER — Other Ambulatory Visit (INDEPENDENT_AMBULATORY_CARE_PROVIDER_SITE_OTHER): Payer: Medicare PPO

## 2019-06-07 ENCOUNTER — Telehealth: Payer: Self-pay

## 2019-06-07 DIAGNOSIS — R1030 Lower abdominal pain, unspecified: Secondary | ICD-10-CM | POA: Diagnosis not present

## 2019-06-07 DIAGNOSIS — R8290 Unspecified abnormal findings in urine: Secondary | ICD-10-CM | POA: Diagnosis not present

## 2019-06-07 LAB — POC URINALSYSI DIPSTICK (AUTOMATED)
Blood, UA: NEGATIVE
Glucose, UA: NEGATIVE
Ketones, UA: 5
Nitrite, UA: NEGATIVE
Protein, UA: POSITIVE — AB
Spec Grav, UA: 1.02 (ref 1.010–1.025)
Urobilinogen, UA: 0.2 E.U./dL
pH, UA: 6 (ref 5.0–8.0)

## 2019-06-07 MED ORDER — CEPHALEXIN 500 MG PO CAPS: 500.0000 mg | ORAL_CAPSULE | Freq: Two times a day (BID) | ORAL | 0 refills | Status: AC

## 2019-06-07 NOTE — Telephone Encounter (Signed)
Erin [name] (DPR signed) said that for 1-2 wks pt has had some lower stomach aching and nausea. Pt has slight confusion,and per Erin [name] no frequency,burning or pain when urinates. Erin [name] said they have to coax her to get out of bed and she is not sure why. Erin [name] wonders if could have UTI. pts caregiver will come by [facility] 06/08/19 in late morning or lunchtime to pick up sterile container and wipes and will bring urine specimen back around 2PM.pt has virtual appt scheduled with Dr Glori Bickers 06/08/19 at 4:15. UC & ED precautions given to [facility] and she voiced understanding. [place] CMA is aware.

## 2019-06-07 NOTE — Telephone Encounter (Signed)
Sending keflex for uti  Resulted on mychart  Will plan to see her tomorrow

## 2019-06-07 NOTE — Telephone Encounter (Signed)
That sounds good, thanks

## 2019-06-08 ENCOUNTER — Other Ambulatory Visit: Payer: Medicare PPO

## 2019-06-08 ENCOUNTER — Ambulatory Visit (INDEPENDENT_AMBULATORY_CARE_PROVIDER_SITE_OTHER): Payer: Medicare PPO | Admitting: Family Medicine

## 2019-06-08 ENCOUNTER — Other Ambulatory Visit: Payer: Self-pay

## 2019-06-08 ENCOUNTER — Encounter: Payer: Self-pay | Admitting: Family Medicine

## 2019-06-08 DIAGNOSIS — N184 Chronic kidney disease, stage 4 (severe): Secondary | ICD-10-CM

## 2019-06-08 DIAGNOSIS — D469 Myelodysplastic syndrome, unspecified: Secondary | ICD-10-CM

## 2019-06-08 DIAGNOSIS — E1122 Type 2 diabetes mellitus with diabetic chronic kidney disease: Secondary | ICD-10-CM | POA: Diagnosis not present

## 2019-06-08 DIAGNOSIS — N390 Urinary tract infection, site not specified: Secondary | ICD-10-CM

## 2019-06-08 DIAGNOSIS — R5382 Chronic fatigue, unspecified: Secondary | ICD-10-CM

## 2019-06-08 DIAGNOSIS — C189 Malignant neoplasm of colon, unspecified: Secondary | ICD-10-CM

## 2019-06-08 NOTE — Progress Notes (Signed)
Virtual Visit via Video Note  I connected with Erin [name] on 06/08/19 at  4:15 PM EST by a video enabled telemedicine application and verified that I am speaking with the correct person using two identifiers.  Location: Patient: home Provider: office   I discussed the limitations of evaluation and management by telemedicine and the availability of in person appointments. The patient expressed understanding and agreed to proceed.  Parties involved in encounter  Patient: Erin [name] Niece : Melonie [place]  Provider:  [name] MD    History of Present Illness: Pt presents for possible uti   Pos ua  Sent in keflex- taking it since last night  Feeling a little better   No fever  Is very tired  Lethargic-just wants to stay in bed  No longer nauseated  (vomiting several days before)  Not much appetite  Has not eaten today   Results for orders placed or performed in visit on 06/07/19  POCT Urinalysis Dipstick (Automated)  Result Value Ref Range   Color, UA Amber    Clarity, UA Cloudy    Glucose, UA Negative Negative   Bilirubin, UA Trace    Ketones, UA 5 mg/dL    Spec Grav, UA 1.020 1.010 - 1.025   Blood, UA Negative    pH, UA 6.0 5.0 - 8.0   Protein, UA Positive (A) Negative   Urobilinogen, UA 0.2 0.2 or 1.0 E.U./dL   Nitrite, UA Negative    Leukocytes, UA Large (3+) (A) Negative   keflex was send to her pharmacy and culture is pending   Is urinating a little more  No blood in urine  No flank pain   Weak all over     She has been feeling down  Worries about things  Has renal failure stage 4 as well as MDS Lab Results  Component Value Date   WBC 5.7 04/23/2019   HGB 9.1 (L) 04/23/2019   HCT 30.0 (L) 04/23/2019   MCV 97.4 04/23/2019   PLT 312 04/23/2019   Lab Results  Component Value Date   CREATININE 1.79 (H) 04/23/2019   BUN 33 (H) 04/23/2019   NA 133 (L) 04/23/2019   K 4.2 04/23/2019   CL 103 04/23/2019   CO2 21 (L) 04/23/2019   Seeing  hematology and nephrology  No recent changes   Patient Active Problem List   Diagnosis Date Noted  . High risk medication use 04/26/2019  . MDS/MPN (myelodysplastic/myeloproliferative neoplasms) ([place]) 04/26/2019  . Anemia in chronic kidney disease 01/17/2019  . Benign hypertensive kidney disease with chronic kidney disease 01/17/2019  . Chronic kidney disease, stage IV (severe) ([place]) 01/17/2019  . Leukocytosis 09/04/2018  . Bradycardia 12/29/2017  . Neurocognitive disorder 08/04/2017  . Retinal artery occlusion, branch, right 03/11/2017  . UTI (urinary tract infection) 03/11/2017  . Eczema 01/02/2016  . Brain aneurysm 10/22/2015  . Abnormality of gait 09/23/2015  . H/O: CVA (cerebrovascular accident)   . Goals of care, counseling/discussion   . Slow transit constipation   . Risk for falls 07/05/2012  . Acute renal failure superimposed on stage 2 chronic kidney disease ([place]) 04/16/2012  . Colon cancer H/O 04/07/2012  . Sacroiliac joint dysfunction 04/01/2012  . Sciatica 03/31/2012  . Low back pain 04/02/2011  . Other screening mammogram 09/30/2010  . Prediabetes 09/30/2010  . Post-menopausal 09/30/2010  . ABDOMINAL WALL HERNIA 02/26/2010  . Mild anemia 08/18/2009  . Essential hypertension 10/31/2007  . Hyperlipidemia LDL goal <100 07/26/2006  . HEMORRHOIDS  07/26/2006  . OSTEOARTHRITIS 07/26/2006  . Osteopenia 07/26/2006  . Disturbance in sleep behavior 07/26/2006   Past Medical History:  Diagnosis Date  . Diabetes mellitus    borderline  . Encephalopathy   . Hyperlipidemia   . Hypertension   . Memory loss   . Obesity   . Osteoarthritis   . Osteoporosis   . Sacroiliac joint dysfunction 04/01/2012  . 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 APPENDECTOMY    . cervicitis  1964   conization   . CHOLECYSTECTOMY    . COLON RESECTION  1988   secondary to cancer  . endometrial polyp  05/2000    hyperplasia-laser treatment  . JOINT REPLACEMENT     Rt knee replacement  . POLYPECTOMY  1998   benign X 2  . TOTAL KNEE ARTHROPLASTY  07/2009   right    Social History   Tobacco Use  . Smoking status: Former Smoker    Types: Cigarettes    Quit date: 08/14/1962    Years since quitting: 56.8  . Smokeless tobacco: Never Used  . Tobacco comment: Quit over 10 years ago  Substance Use Topics  . Alcohol use: No    Alcohol/week: 0.0 standard drinks  . Drug use: No   Family History  Problem Relation Age of Onset  . Kidney failure Mother   . Stroke Father   . Cancer Brother        lung   Allergies  Allergen Reactions  . Fluoxetine Hcl Nausea And Vomiting  . Hydrocodone Nausea And Vomiting  . Shellfish Allergy Nausea And Vomiting    Makes the patient feel "sick"  . Tape Other (See Comments)    Tears and bruises; please use Coban wrap  . Diclofenac Sodium Nausea And Vomiting  . Latex Rash   Current Outpatient Medications on File Prior to Visit  Medication Sig Dispense Refill  . acetaminophen (TYLENOL) 325 MG tablet Take 650 mg by mouth every 6 (six) hours as needed for mild pain.    Marland Kitchen allopurinol (ZYLOPRIM) 100 MG tablet TAKE 0.5 TABLETS (50 MG TOTAL) BY MOUTH EVERY OTHER DAY. 24 tablet 1  . amLODipine (NORVASC) 10 MG tablet Take 1 tablet (10 mg total) by mouth daily. 90 tablet 3  . aspirin 325 MG tablet Take 325 mg by mouth daily.     . calcium carbonate (OS-CAL) 600 MG TABS tablet Take 600 mg by mouth daily with breakfast.     . cephALEXin (KEFLEX) 500 MG capsule Take 1 capsule (500 mg total) by mouth 2 (two) times daily. 14 capsule 0  . escitalopram (LEXAPRO) 10 MG tablet Take 1 tablet (10 mg total) by mouth daily. 90 tablet 3  . famotidine (PEPCID) 20 MG tablet Take 1 tablet (20 mg total) by mouth 2 (two) times daily. 180 tablet 3  . lisinopril (ZESTRIL) 5 MG tablet Take 1 tablet (5 mg total) by mouth daily. 90 tablet 3  . NUTRITIONAL SUPPLEMENT LIQD Take 120 mLs by mouth  daily.     . polyethylene glycol (MIRALAX / GLYCOLAX) 17 g packet Take 17 g by mouth daily as needed.    . prochlorperazine (COMPAZINE) 10 MG tablet Take 1 tablet (10 mg total) by mouth every 6 (six) hours as needed for nausea or vomiting. 30 tablet 0  . senna-docusate (SENNA-S) 8.6-50 MG tablet TAKE TWO (2) TABLETS BY MOUTH 2 TIMES DAILY FOR CONSTIPATION 120 tablet  0  . vitamin C (ASCORBIC ACID) 500 MG tablet Take 500 mg by mouth daily.     . [DISCONTINUED] hydroxyurea (HYDREA) 500 MG capsule Take 1 capsule (500 mg total) by mouth daily. May take with food to minimize GI side effects. 30 capsule 3   Current Facility-Administered Medications on File Prior to Visit  Medication Dose Route Frequency Provider Last Rate Last Admin  . acetaminophen (TYLENOL) tablet 650 mg  650 mg Oral Q6H PRN Sindy Guadeloupe, MD   650 mg at 12/11/18 1601    Review of Systems  Constitutional: Positive for malaise/fatigue and weight loss. Negative for chills and fever.  HENT: Negative for congestion, ear pain, sinus pain and sore throat.   Eyes: Negative for blurred vision, discharge and redness.  Respiratory: Negative for cough, shortness of breath and stridor.   Cardiovascular: Negative for chest pain, palpitations and leg swelling.  Gastrointestinal: Negative for abdominal pain, diarrhea, nausea and vomiting.  Genitourinary: Negative for dysuria, flank pain, frequency, hematuria and urgency.  Musculoskeletal: Negative for myalgias.  Skin: Negative for rash.  Neurological: Negative for dizziness and headaches.  Psychiatric/Behavioral: Positive for depression and memory loss.       Lethargic lately     Observations/Objective: Patient appears well, in no distress Sitting in chair /recliner  Weight is baseline  No facial swelling or asymmetry Normal voice-not hoarse and no slurred speech No obvious tremor or mobility impairment Moving neck and UEs normally Able to hear the call well  No cough or shortness of  breath during interview  Cognitive decline/repeats her self (memory may be worse)   Family helps with history No skin changes on face or neck , no rash or pallor Affect is normal    Assessment and Plan: Problem List Items Addressed This Visit      Digestive   Colon cancer H/O (Chronic)    No re occurance        Genitourinary   UTI (urinary tract infection) - Primary    Pos ua pending culture on keflex in pt with renal disease Slight improvement today (has had 2 doses)  Enc a better fluid intake-disc with pt and her caregiver  Will update with cx result       Chronic kidney disease, stage IV (severe) (HCC)    tx uti now with keflex  Stressed water intake Continue nephrology f/u        Other   Fatigue    In pt with renal dz and myelodysplastic syndrome  Also new uti diagnosis  Enc her to drink more fluids  Disc poss of depression as well  If no significant improvement may consider home PT      MDS/MPN (myelodysplastic/myeloproliferative neoplasms) ([place])    Continues hematology f/u  May be adding to fatigue        Other Visit Diagnoses    Type 2 diabetes mellitus with stage 4 chronic kidney disease, without long-term current use of insulin (HCC)   (Chronic)         Follow Up Instructions: Continue the keflex and good water intake  Alert [place] if symptoms do not improve  We will contact you when urine culture result returns   We may consider physical therapy for weakness in the future   I discussed the assessment and treatment plan with the patient. The patient was provided an opportunity to ask questions and all were answered. The patient agreed with the plan and demonstrated an understanding of the instructions.  The patient was advised to call back or seek an in-person evaluation if the symptoms worsen or if the condition fails to improve as anticipated.     [name], MD

## 2019-06-09 LAB — URINE CULTURE
MICRO NUMBER:: 10188707
SPECIMEN QUALITY:: ADEQUATE

## 2019-06-10 NOTE — Assessment & Plan Note (Signed)
No re occurance 

## 2019-06-10 NOTE — Assessment & Plan Note (Signed)
tx uti now with keflex  Stressed water intake Continue nephrology f/u

## 2019-06-10 NOTE — Patient Instructions (Signed)
Continue the keflex and good water intake  Alert [place] if symptoms do not improve  We will contact you when urine culture result returns   We may consider physical therapy for weakness in the future

## 2019-06-10 NOTE — Assessment & Plan Note (Signed)
In pt with renal dz and myelodysplastic syndrome  Also new uti diagnosis  Enc her to drink more fluids  Disc poss of depression as well  If no significant improvement may consider home PT

## 2019-06-10 NOTE — Assessment & Plan Note (Signed)
Pos ua pending culture on keflex in pt with renal disease Slight improvement today (has had 2 doses)  Enc a better fluid intake-disc with pt and her caregiver  Will update with cx result

## 2019-06-10 NOTE — Assessment & Plan Note (Signed)
Continues hematology f/u  May be adding to fatigue

## 2019-06-15 ENCOUNTER — Telehealth: Payer: Self-pay

## 2019-06-15 DIAGNOSIS — R627 Adult failure to thrive: Secondary | ICD-10-CM

## 2019-06-15 DIAGNOSIS — D469 Myelodysplastic syndrome, unspecified: Secondary | ICD-10-CM

## 2019-06-15 DIAGNOSIS — Z7189 Other specified counseling: Secondary | ICD-10-CM

## 2019-06-15 DIAGNOSIS — C946 Myelodysplastic disease, not classified: Secondary | ICD-10-CM

## 2019-06-15 NOTE — Telephone Encounter (Signed)
I am happy to start with a palliative care referral, and they can recommend hospice as well if they think she would qualify.  Would they like me to do a referral?

## 2019-06-15 NOTE — Addendum Note (Signed)
Addended by: Ria Bush on: 06/15/2019 05:28 PM   Modules accepted: Orders

## 2019-06-15 NOTE — Telephone Encounter (Signed)
Guss Bunde, niece, called concerned for the pt. She has stopped eating. Again and not drinking not even her Ensure. Has talked to Dr Glori Bickers a lot recently about her. Was just seen 06-08-19 for a virtual visit.  [name] is asking if we can get Hospice involved for failure to threive. Asking if another provider would be willing to take care of this in Dr Marliss Coots absence. She lives in [place].  Sent to Dr Danise Mina.

## 2019-06-15 NOTE — Telephone Encounter (Signed)
Spoke with [name]. She is really not sure if P.T would be an option. Patient has not been eating much at all or drinking in the last 3 weeks. I advised of all the feedback from Dr Darnell Level and she said she thinks we maybe reaching the last 6 months of life for patient just from what she has seen and how patient is acting. She thinks an evaluation would be good to acces the patient. [name] just does not know what to do, she is at her "wits end."

## 2019-06-15 NOTE — Telephone Encounter (Signed)
Palliative care referral placed.

## 2019-06-15 NOTE — Telephone Encounter (Signed)
plz call niece Reviewed last PCP note - where they discussed PT eval - are they interested in this first?  Otherwise may be reasonable to start with palliative care evaluation. Palliative care can help with any chronic disease management at home. Hospice would be an option if we're worried that we're approaching last 6 months of life (what does niece think?).  Let me know what niece says.

## 2019-06-15 NOTE — Telephone Encounter (Signed)
Lvm for pt's niece, [name] (on dpr) to call back.  Need to relay Dr. Synthia Innocent message.

## 2019-06-17 ENCOUNTER — Ambulatory Visit: Payer: Medicare PPO | Attending: Internal Medicine

## 2019-06-17 DIAGNOSIS — Z23 Encounter for immunization: Secondary | ICD-10-CM | POA: Insufficient documentation

## 2019-06-17 NOTE — Progress Notes (Signed)
   Covid-19 Vaccination Clinic  Name:  Erin [name]    MRN: [id number] DOB: 05-04-1928  06/17/2019  [name] was observed post Covid-19 immunization for 30 minutes based on pre-vaccination screening without incident. She was provided with Vaccine Information Sheet and instruction to access the V-Safe system.   [name] was instructed to call 911 with any severe reactions post vaccine: Marland Kitchen Difficulty breathing  . Swelling of face and throat  . A fast heartbeat  . A bad rash all over body  . Dizziness and weakness   Immunizations Administered    Name Date Dose VIS Date Route   Pfizer COVID-19 Vaccine 06/17/2019  4:38 PM 0.3 mL 03/23/2019 Intramuscular   Manufacturer: [place]   Lot: [id number]   [place]: [id number]

## 2019-06-20 ENCOUNTER — Telehealth: Payer: Self-pay | Admitting: Family Medicine

## 2019-06-20 ENCOUNTER — Telehealth: Payer: Self-pay | Admitting: Nurse Practitioner

## 2019-06-20 NOTE — Telephone Encounter (Signed)
[place] f/u with [name] regarding referral and advise her of comments regarding pure wick

## 2019-06-20 NOTE — Telephone Encounter (Signed)
Erin [name] (DPR signed) left v/m that she has not heard from anyone about the hospice or palliative care referral placed on 06/15/19 for pt to have an assessment. Also Erin [name] wants to know if Dr Diona Browner can prescribe the wand for urine that Erin [name] has seen on TV;(could be the Purewick urine collection system) pt has trouble getting out of bed and the wand would keep pt dry. Kelly request cb.

## 2019-06-20 NOTE — Telephone Encounter (Signed)
Called patient's niece, [name] back and we have scheduled a Telephone Palliative Consult for 06/26/19 @ 4 PM.

## 2019-06-20 NOTE — Telephone Encounter (Signed)
It looks like Dr Darnell Level put the order in in my absence (thanks for the help)  I will cc to [facility] to see if we know about status of order   I have been unable so far to get pure wick covered by anyone's insurance - have her call the ins co to see if it is paid for

## 2019-06-20 NOTE — Telephone Encounter (Signed)
Called Palliative Care and they have already been in touch with [name] about the Assessment. Called Kelly back and told her to call [employer] about the Pure Elza Rafter that they are wanting to get for [employer]. She will call [employer] .

## 2019-06-26 ENCOUNTER — Other Ambulatory Visit: Payer: Medicare PPO | Admitting: Nurse Practitioner

## 2019-06-26 ENCOUNTER — Other Ambulatory Visit: Payer: Self-pay | Admitting: Oncology

## 2019-06-26 ENCOUNTER — Encounter: Payer: Self-pay | Admitting: Nurse Practitioner

## 2019-06-26 ENCOUNTER — Other Ambulatory Visit: Payer: Self-pay

## 2019-06-26 DIAGNOSIS — D469 Myelodysplastic syndrome, unspecified: Secondary | ICD-10-CM | POA: Diagnosis not present

## 2019-06-26 DIAGNOSIS — Z515 Encounter for palliative care: Secondary | ICD-10-CM | POA: Diagnosis not present

## 2019-06-26 DIAGNOSIS — R110 Nausea: Secondary | ICD-10-CM

## 2019-06-26 NOTE — Progress Notes (Signed)
[facility] Consult Note Telephone: [phone]  Fax: [phone]  PATIENT NAME: Erin [name] DOB: 29-Apr-1928 MRN: [id number]  PRIMARY CARE PROVIDER:   Abner Greenspan, MD  REFERRING PROVIDER:  Abner Greenspan, MD [address] [place],  [place] [address]  RESPONSIBLE PARTY:   Erin [name] niece [phone] or Erin [name] niece [phone]  Due to the COVID-19 crisis, this visit was done via telemedicine from my office and it was initiated and consent by this patient and or family.   RECOMMENDATIONS and PLAN:  1. ACP: DNR placed in [place]; wishes are for comfort care at home. Wishes are for hospice. Agreeable to have Hospice Physicians to review for eligibility.   Hospice Physicians felt eligible, I called Erin [name], niece and updated, in agreement with Hospice services. I will notify Dr Glori Bickers for the Hospice order.  2. Palliative care encounter; Palliative medicine team will continue to support patient, patient's family, and medical team. Visit consisted of counseling and education dealing with the complex and emotionally intense issues of symptom management and palliative care in the setting of serious and potentially life-threatening illness  I spent 65 minutes providing this consultation,  from 3:55pm to 5:00pm. More than 50% of the time in this consultation was spent coordinating communication.   HISTORY OF PRESENT ILLNESS:  Erin [name] is a 86 y.o. year old female with multiple medical problems including Brain aneurysm, right retinal artery occlusion, colon resection secondary to colon cancer, myelodysplastic syndrome, memory loss, hypertension, hyperlipidemia, diabetes, chronic kidney disease, anemia, obesity, osteoarthritis, osteoporosis, history of upper GI bleed, right total knee arthroplasty, pulpectomy, cholecystectomy, appendectomy, abdominal wall hernia. 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 Saw Dr Juleen China Nephrology 2 / 9 / 2021 for chronic kidney disease stage 4. Weight 172lbs. 2 / 26 / 2021 saw Dr Glori Bickers by telemedicine for positive UTI with Keflex. Scheduled initial palliative care visit by telemedicine is video not available. I called Erin [name], Erin [name] niece. We talked about purpose of palliative care. Kelly in agreement and added a net her other nice for conference call. Erin [name] and Erin [name] endorse about 6 weeks ago missed red was able to ambulate, help herself get dressed. Over the last six weeks Ms. Erin [name] has become bed-bound. Erin [name] endorses Ms. Erin [name] has to lift her up to pivot her to put her in a chair. Erin [name] endure so she has total ADL care, incontinence. Erin [name] endorses Ms. Erin [name] has lost between 10 and 15 pounds over the last week. Ms. Erin [name] has had little to eat and very little oral intake. Erin [name] endorses Ms. Erin [name] is sleeping a lot. Erin [name] and Erin [name] endorses they feel like Ms. Erin [name] is rapidly declining. We talked about medical goals of care including aggressive versus conservative versus comfort care. We talked about code status. Erin [name] endorses Ms. Erin [name] was a DNR. Erin [name] and Erin [name] both endorse they wish for Ms. Erin [name] to be a DNR. Will complete [place] form. Erin [name] endorses Ms. Erin [name] wishes to pass their own home. We talked about Medicare hospice benefit and what services are provided. Erin [name] and [place] both in agreement to have hospice positions review the case for eligibility. We talked about role of palliative care and plan of care. 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 She has  a disabled son that lives with her. We talked about chronic progression of disease including chronic renal failure, mild dysplastic syndrome in addition to medications including hydroxyurea. Erin [name] endorses it would be okay to  discontinue the hydroxyurea if need be to sign for [employer]. Therapeutic listening and emotional support provided. Contact information provided. Questions answered to satisfaction. Discuss that will contact [employer] once received information from [employer] concerning eligibility and then will proceed from there. Palliative Care was asked to help to continue to address goals of care.   CODE STATUS: DNR  PPS: 30% HOSPICE ELIGIBILITY/DIAGNOSIS: TBD  PAST MEDICAL HISTORY:  Past Medical History:  Diagnosis Date  . Diabetes mellitus    borderline  . Encephalopathy   . Hyperlipidemia   . Hypertension   . Memory loss   . Obesity   . Osteoarthritis   . Osteoporosis   . Sacroiliac joint dysfunction 04/01/2012  . Upper GI bleed    AV malformation/when anticoag    SOCIAL HX:  Social History   Tobacco Use  . Smoking status: Former Smoker    Types: Cigarettes    Quit date: 08/14/1962    Years since quitting: 56.9  . Smokeless tobacco: Never Used  . Tobacco comment: Quit over 10 years ago  Substance Use Topics  . Alcohol use: No    Alcohol/week: 0.0 standard drinks    ALLERGIES:  Allergies  Allergen Reactions  . Fluoxetine Hcl Nausea And Vomiting  . Hydrocodone Nausea And Vomiting  . Shellfish Allergy Nausea And Vomiting    Makes the patient feel "sick"  . Tape Other (See Comments)    Tears and bruises; please use Coban wrap  . Diclofenac Sodium Nausea And Vomiting  . Latex Rash     PERTINENT MEDICATIONS:  Outpatient Encounter Medications as of 06/26/2019  Medication Sig  . acetaminophen (TYLENOL) 325 MG tablet Take 650 mg by mouth every 6 (six) hours as needed for mild pain.  Marland Kitchen allopurinol (ZYLOPRIM) 100 MG tablet TAKE 0.5 TABLETS (50 MG TOTAL) BY MOUTH EVERY OTHER DAY.  Marland Kitchen amLODipine (NORVASC) 10 MG tablet Take 1 tablet (10 mg total) by mouth daily.  Marland Kitchen aspirin 325 MG tablet Take 325 mg by mouth daily.   . calcium carbonate (OS-CAL) 600 MG TABS tablet  Take 600 mg by mouth daily with breakfast.   . cephALEXin (KEFLEX) 500 MG capsule Take 1 capsule (500 mg total) by mouth 2 (two) times daily.  Marland Kitchen escitalopram (LEXAPRO) 10 MG tablet Take 1 tablet (10 mg total) by mouth daily.  . famotidine (PEPCID) 20 MG tablet Take 1 tablet (20 mg total) by mouth 2 (two) times daily.  Marland Kitchen lisinopril (ZESTRIL) 5 MG tablet Take 1 tablet (5 mg total) by mouth daily.  Marland Kitchen NUTRITIONAL SUPPLEMENT LIQD Take 120 mLs by mouth daily.   . polyethylene glycol (MIRALAX / GLYCOLAX) 17 g packet Take 17 g by mouth daily as needed.  . prochlorperazine (COMPAZINE) 10 MG tablet TAKE 1 TABLET (10 MG TOTAL) BY MOUTH EVERY 6 (SIX) HOURS AS NEEDED FOR NAUSEA OR VOMITING.  . senna-docusate (SENNA-S) 8.6-50 MG tablet TAKE TWO (2) TABLETS BY MOUTH 2 TIMES DAILY FOR CONSTIPATION  . vitamin C (ASCORBIC ACID) 500 MG tablet Take 500 mg by mouth daily.   . [DISCONTINUED] hydroxyurea (HYDREA) 500 MG capsule Take 1 capsule (500 mg total) by mouth daily. May take with food to minimize GI side effects.   Facility-Administered Encounter Medications as of 06/26/2019  Medication  . acetaminophen (  TYLENOL) tablet 650 mg    PHYSICAL EXAM:   Deferred  Darnel Mchan Z Chivon Lepage, NP

## 2019-06-29 ENCOUNTER — Other Ambulatory Visit: Payer: Self-pay | Admitting: *Deleted

## 2019-06-29 MED ORDER — MORPHINE SULFATE (CONCENTRATE) 20 MG/ML PO SOLN: ORAL | 0 refills | Status: AC

## 2019-06-29 NOTE — Telephone Encounter (Signed)
Patient not doing well and needs Roxanol for the weekend. She is going to call in Lorazepam and Haldol to pharmacy per protocol

## 2019-07-12 DEATH — deceased

## 2019-07-18 ENCOUNTER — Ambulatory Visit: Payer: Medicare PPO

## 2019-07-23 ENCOUNTER — Other Ambulatory Visit: Payer: Medicare PPO

## 2019-07-24 ENCOUNTER — Telehealth: Payer: Medicare PPO | Admitting: Oncology

## 2019-07-30 ENCOUNTER — Telehealth: Payer: Self-pay

## 2019-07-30 NOTE — Telephone Encounter (Signed)
Patient's niece-[name] wanted to know what the initials for MDS and CKD meant since she had seen it on her aunt's death certificate. I told her what they meant and she had no further questions. Erin [name] just wanted to make sure.

## 2019-09-11 ENCOUNTER — Ambulatory Visit: Payer: Medicare PPO

## 2019-09-11 ENCOUNTER — Other Ambulatory Visit: Payer: Medicare PPO

## 2019-09-11 IMAGING — CT CT CERVICAL SPINE W/O CM
3 of 7 series · 12 of 33 positions shown, 14 images · non-contrast
Comparison: None.

CLINICAL DATA: Cervical spine trauma.

EXAM:
CT HEAD WITHOUT CONTRAST
CT CERVICAL SPINE WITHOUT CONTRAST
TECHNIQUE: Multidetector CT imaging of the head and cervical spine was
performed following the standard protocol without intravenous
contrast. Multiplanar CT image reconstructions of the cervical spine
were also generated.

[Series 10: c_spine 2.0 st · axial · 0.29mm/px · z∈[-254,-124]mm · 6 of 93 slices shown, 8 images]
[im 14/93  soft-tissue]
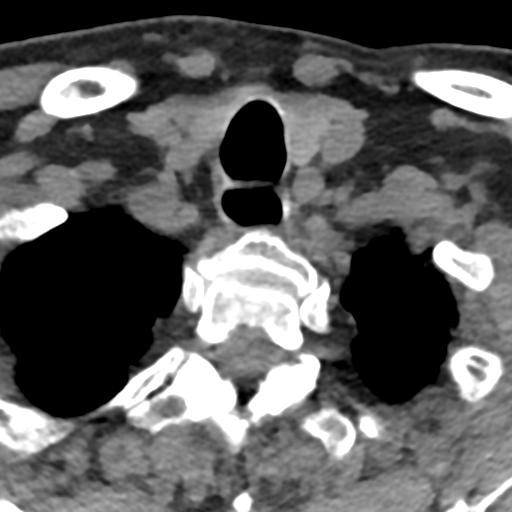
[im 14/93  bone]
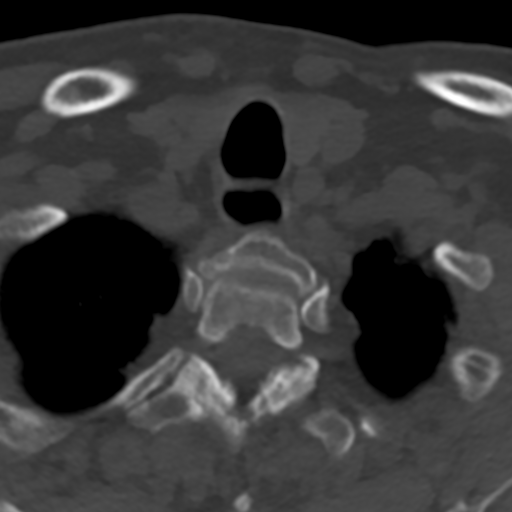
[im 27/93  bone]
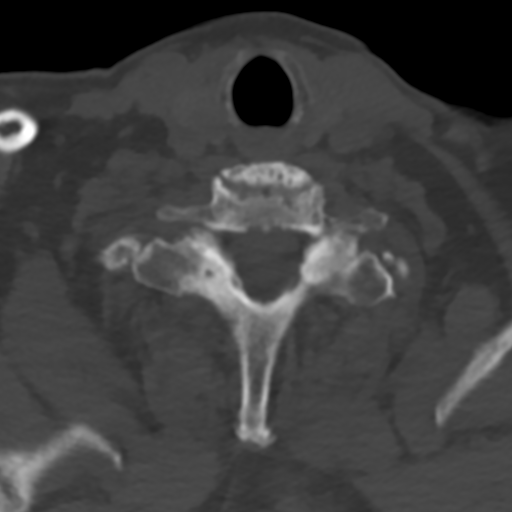
[im 40/93  bone]
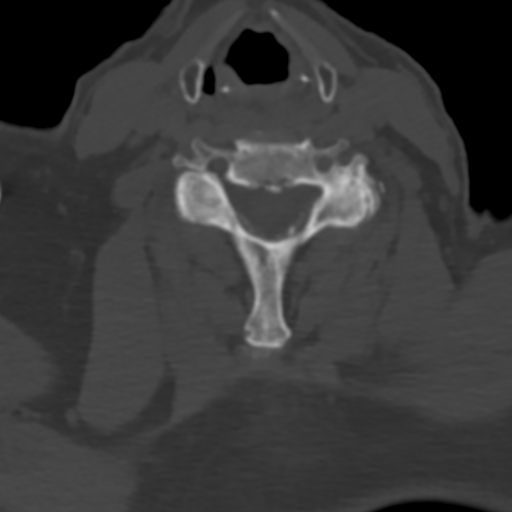
[im 53/93  bone]
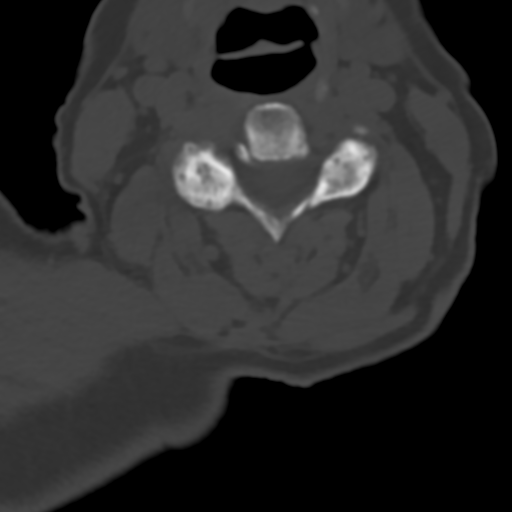
[im 66/93  soft-tissue]
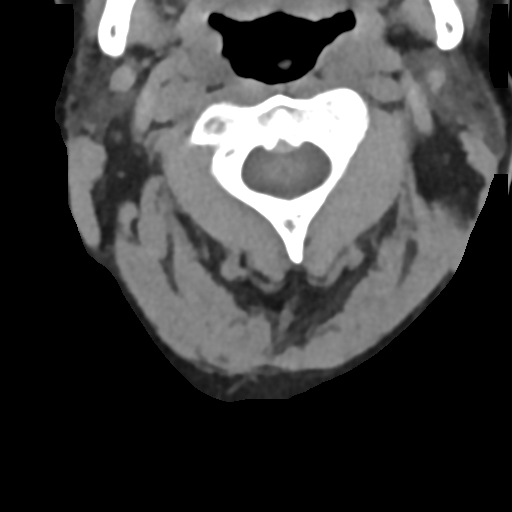
[im 66/93  bone]
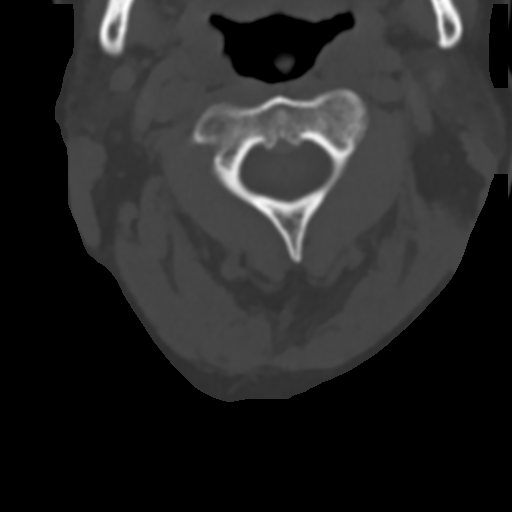
[im 79/93  bone]
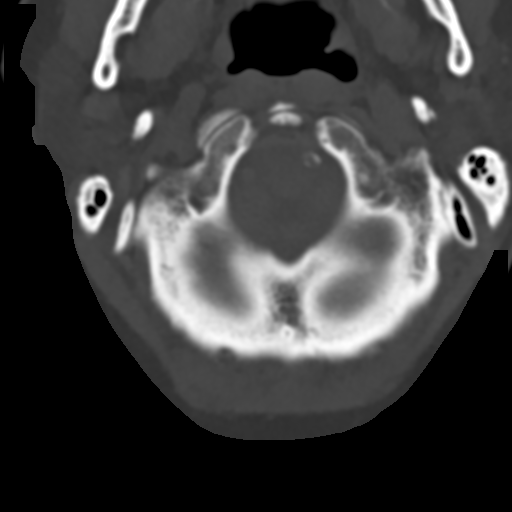

[Series 11: coronal bone · coronal · 0.23mm/px · 1 of 54 slices shown]
[im 27/54  bone]
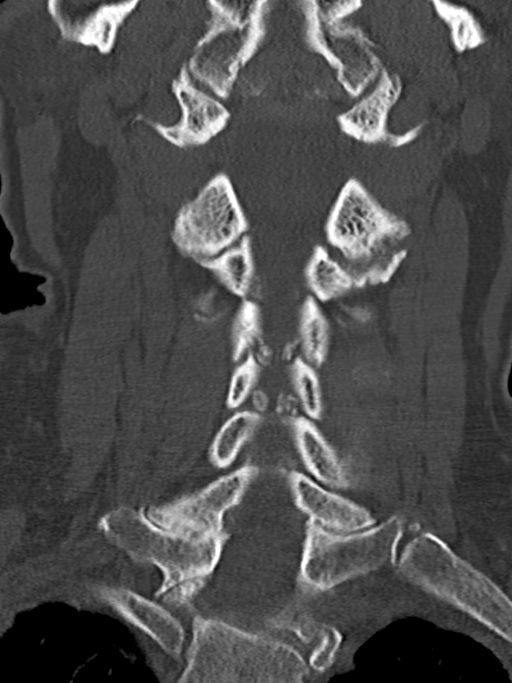

[Series 12: sagittal bone · sagittal · 0.21mm/px · 5 of 61 slices shown]
[im 11/61  bone]
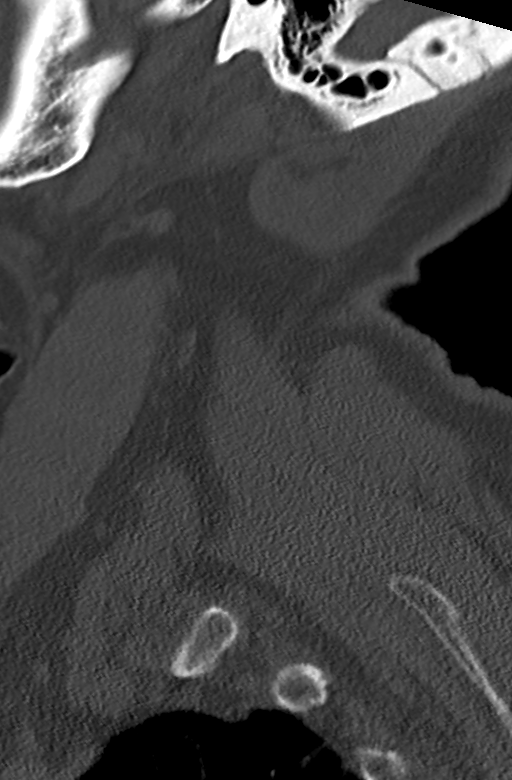
[im 21/61  bone]
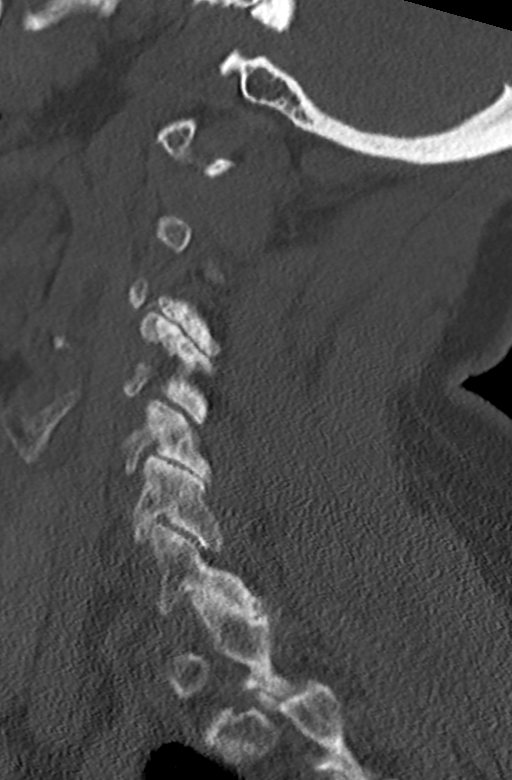
[im 31/61  bone]
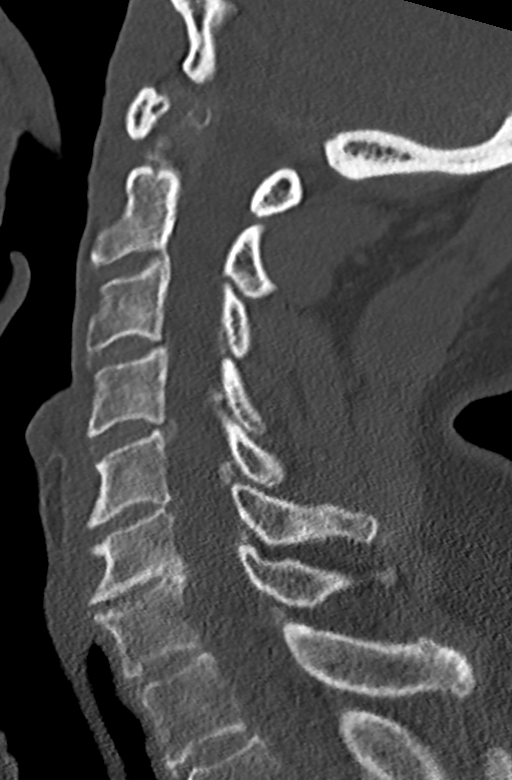
[im 41/61  bone]
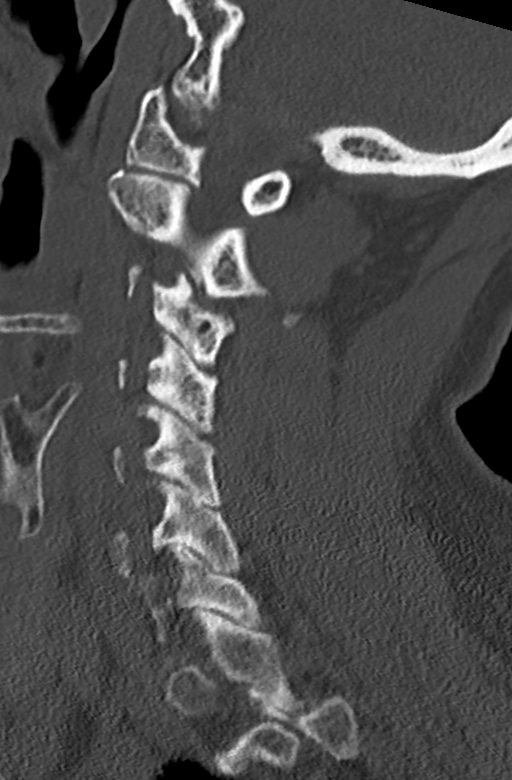
[im 51/61  bone]
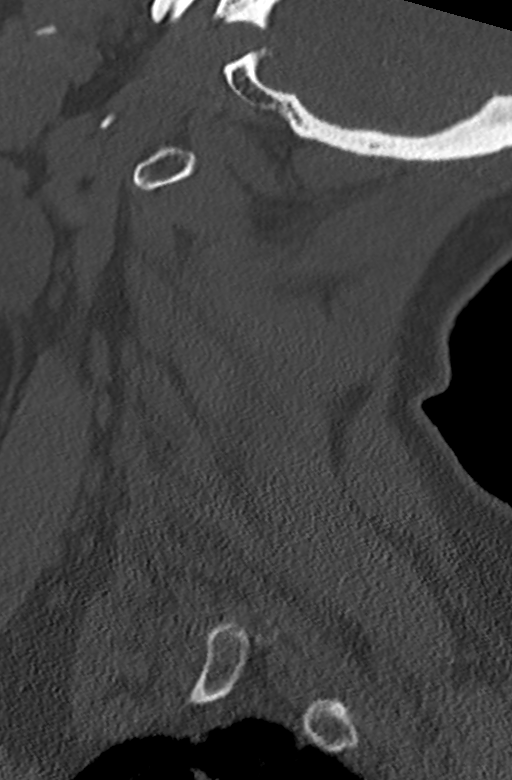

[12 of 33 positions shown; findings below may reference images not displayed]

FINDINGS: CT HEAD FINDINGS

Brain: Mild chronic ischemic white matter disease is noted. No mass
effect or midline shift is noted. Ventricular size is within normal
limits. There is no evidence of mass lesion, hemorrhage or acute
infarction.

Vascular: No hyperdense vessel or unexpected calcification.

Skull: Normal. Negative for fracture or focal lesion.

Sinuses/Orbits: No acute finding.

Other: None.

CT CERVICAL SPINE FINDINGS

Alignment: Normal.

Skull base and vertebrae: No acute fracture. No primary bone lesion
or focal pathologic process.

Soft tissues and spinal canal: No prevertebral fluid or swelling. No
visible canal hematoma.

Disc levels: Mild degenerative disc disease is noted at C5-6.
Moderate degenerative disc disease is noted at C6-7 with anterior
osteophyte formation.

Upper chest: Biapical scarring is noted.

Other: Degenerative changes seen involving posterior facet joints
bilaterally.
IMPRESSION: Mild chronic ischemic white matter disease. No acute intracranial
abnormality seen.

Multilevel degenerative disc disease is noted in the cervical spine.
No fracture or spondylolisthesis is noted.

## 2019-09-18 ENCOUNTER — Encounter: Payer: Medicare PPO | Admitting: Family Medicine

## 2020-10-25 IMAGING — CT CT BONE MARROW BIOPSY AND ASPIRATION
1 of 2 series · 9 of 14 positions shown, 12 images · non-contrast
Comparison: none

INDICATION: 89-year-old female with leukocytosis and peripheral smear
abnormalities concerning for a primary lymphoproliferative process.

[Series 2: i-spiral 5.0 b30f · axial · 0.70mm/px · z∈[+1030,+1086]mm · 9 of 21 slices shown, 12 images]
[im 3/21  soft-tissue]
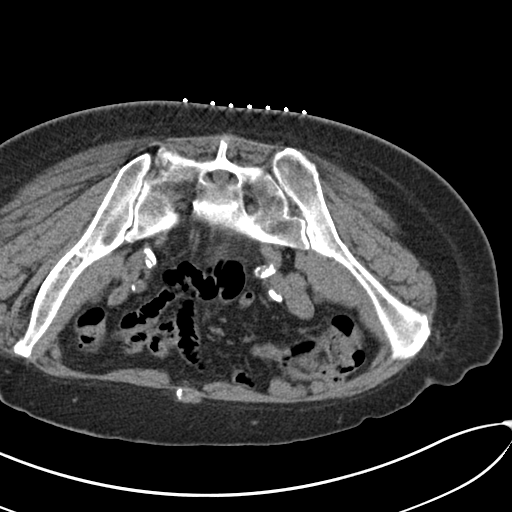
[im 3/21  bone]
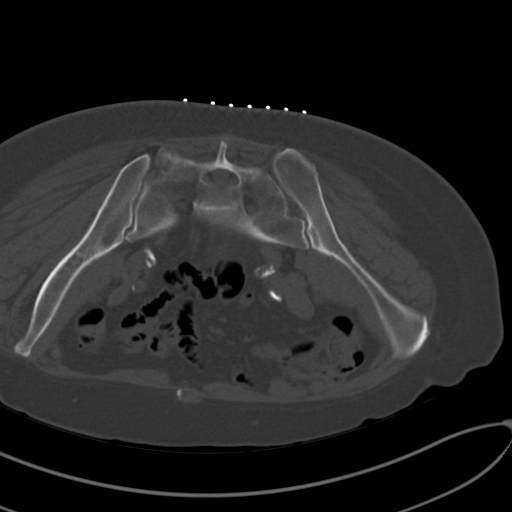
[im 5/21  bone]
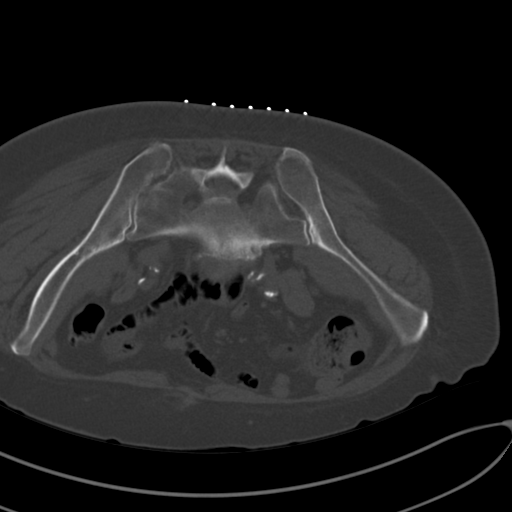
[im 7/21  bone]
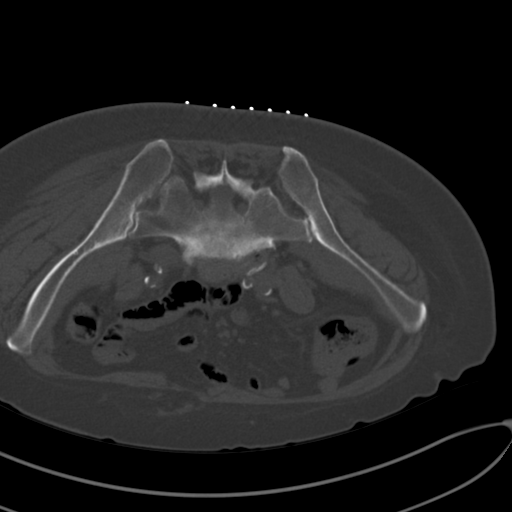
[im 9/21  bone]
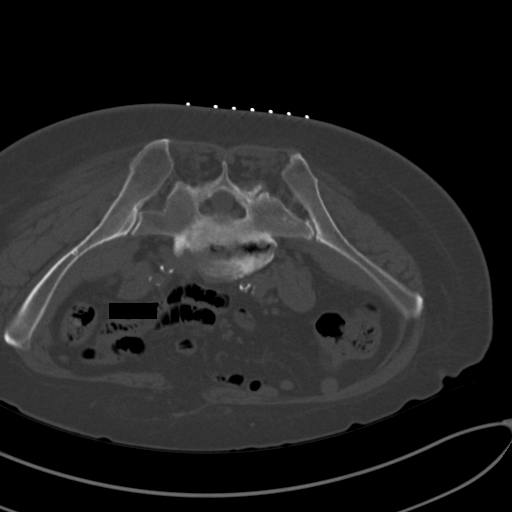
[im 11/21  soft-tissue]
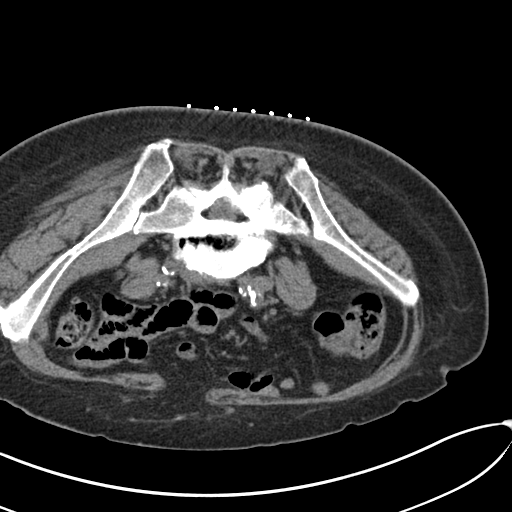
[im 11/21  bone]
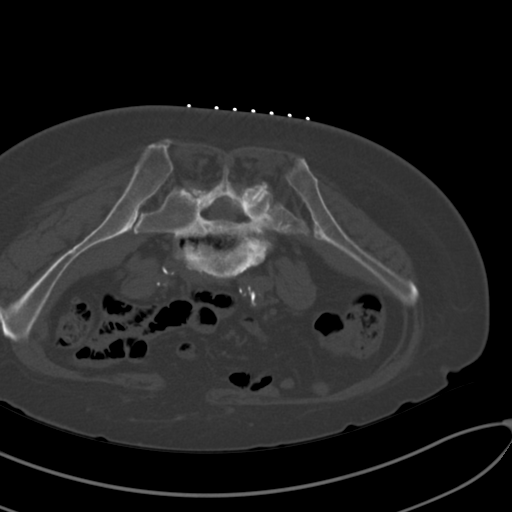
[im 13/21  bone]
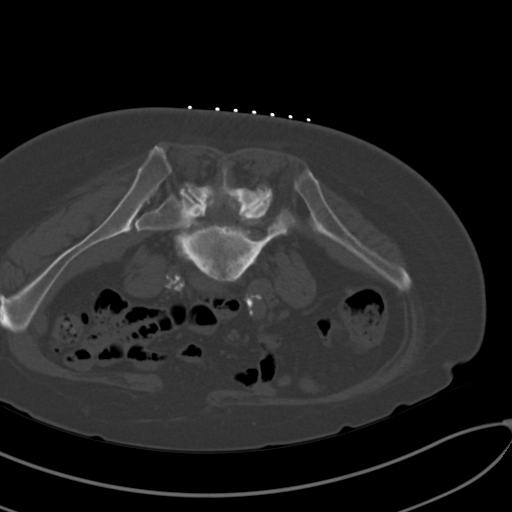
[im 15/21  bone]
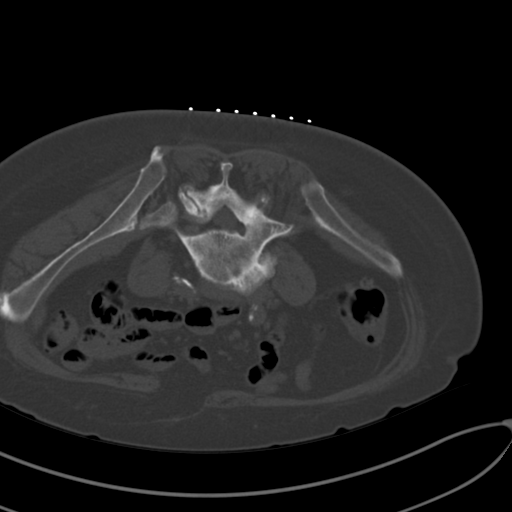
[im 17/21  bone]
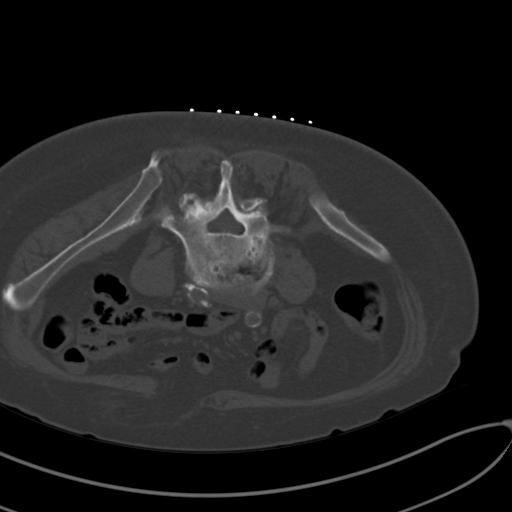
[im 19/21  soft-tissue]
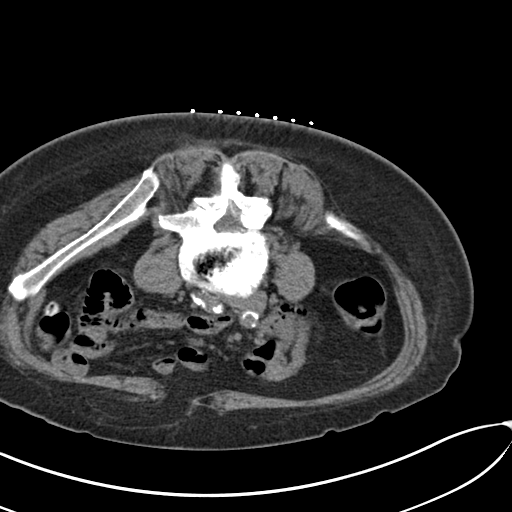
[im 19/21  bone]
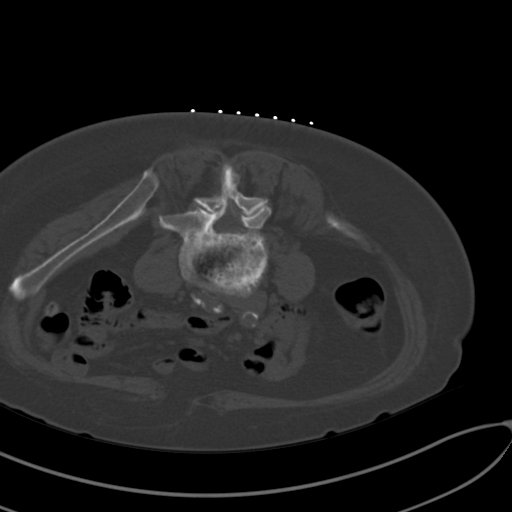

[9 of 14 positions shown; findings below may reference images not displayed]

EXAM:
CT GUIDED BONE MARROW ASPIRATION AND CORE BIOPSY

MEDICATIONS:
None.

ANESTHESIA/SEDATION:
Moderate (conscious) sedation was employed during this procedure. A
total of 2 milligrams versed and 75 micrograms fentanyl were
administered intravenously. The patient's level of consciousness and
vital signs were monitored continuously by radiology nursing
throughout the procedure under my direct supervision.

Total monitored sedation time: 13 minutes

FLUOROSCOPY TIME:  None

COMPLICATIONS:
None immediate.

Estimated blood loss: <25 mL

PROCEDURE:
Informed written consent was obtained from the patient after a
thorough discussion of the procedural risks, benefits and
alternatives. All questions were addressed. Maximal Sterile Barrier
Technique was utilized including caps, mask, sterile gowns, sterile
gloves, sterile drape, hand hygiene and skin antiseptic. A timeout
was performed prior to the initiation of the procedure.

The patient was positioned prone and non-contrast localization CT
was performed of the pelvis to demonstrate the iliac marrow spaces.

Maximal barrier sterile technique utilized including caps, mask,
sterile gowns, sterile gloves, large sterile drape, hand hygiene,
and betadine prep.

Under sterile conditions and local anesthesia, an 11 gauge coaxial
bone biopsy needle was advanced into the right iliac marrow space.
Needle position was confirmed with CT imaging. Initially, bone
marrow aspiration was performed. Next, the 11 gauge outer cannula
was utilized to obtain a right iliac bone marrow core biopsy. Needle
was removed. Hemostasis was obtained with compression. The patient
tolerated the procedure well. Samples were prepared with the
cytotechnologist.
IMPRESSION: Technically successful right bone marrow aspiration and core biopsy
using CT guidance.

## 2023-04-14 NOTE — Telephone Encounter (Signed)
 Error
